# Patient Record
Sex: Female | Born: 1950
Health system: Southern US, Community
[De-identification: ages and names within clinical notes are randomized; demographics above are authoritative.]

## PROBLEM LIST (undated history)

## (undated) DIAGNOSIS — F419 Anxiety disorder, unspecified: Secondary | ICD-10-CM

## (undated) DIAGNOSIS — E785 Hyperlipidemia, unspecified: Secondary | ICD-10-CM

## (undated) DIAGNOSIS — K219 Gastro-esophageal reflux disease without esophagitis: Secondary | ICD-10-CM

## (undated) DIAGNOSIS — H269 Unspecified cataract: Secondary | ICD-10-CM

## (undated) DIAGNOSIS — L509 Urticaria, unspecified: Secondary | ICD-10-CM

## (undated) DIAGNOSIS — M1990 Unspecified osteoarthritis, unspecified site: Secondary | ICD-10-CM

## (undated) DIAGNOSIS — I10 Essential (primary) hypertension: Secondary | ICD-10-CM

## (undated) HISTORY — DX: Gastro-esophageal reflux disease without esophagitis: K21.9

## (undated) HISTORY — DX: Unspecified cataract: H26.9

## (undated) HISTORY — DX: Hyperlipidemia, unspecified: E78.5

## (undated) HISTORY — DX: Unspecified osteoarthritis, unspecified site: M19.90

## (undated) HISTORY — PX: CATARACT EXTRACTION, BILATERAL: SHX1313

## (undated) HISTORY — DX: Urticaria, unspecified: L50.9

## (undated) HISTORY — DX: Anxiety disorder, unspecified: F41.9

## (undated) HISTORY — PX: DILATION AND CURETTAGE OF UTERUS: SHX78

---

## 1999-03-10 ENCOUNTER — Encounter: Payer: Self-pay | Admitting: Internal Medicine

## 1999-03-10 ENCOUNTER — Encounter: Admission: RE | Admit: 1999-03-10 | Discharge: 1999-03-10 | Payer: Self-pay | Admitting: Internal Medicine

## 1999-03-27 ENCOUNTER — Ambulatory Visit (HOSPITAL_COMMUNITY): Admission: RE | Admit: 1999-03-27 | Discharge: 1999-03-27 | Payer: Self-pay | Admitting: Gastroenterology

## 1999-04-02 ENCOUNTER — Encounter (INDEPENDENT_AMBULATORY_CARE_PROVIDER_SITE_OTHER): Payer: Self-pay

## 1999-04-02 ENCOUNTER — Other Ambulatory Visit: Admission: RE | Admit: 1999-04-02 | Discharge: 1999-04-02 | Payer: Self-pay | Admitting: Obstetrics and Gynecology

## 1999-04-18 ENCOUNTER — Ambulatory Visit (HOSPITAL_COMMUNITY): Admission: RE | Admit: 1999-04-18 | Discharge: 1999-04-18 | Payer: Self-pay | Admitting: Obstetrics and Gynecology

## 2000-12-06 ENCOUNTER — Other Ambulatory Visit: Admission: RE | Admit: 2000-12-06 | Discharge: 2000-12-06 | Payer: Self-pay | Admitting: Internal Medicine

## 2001-10-26 ENCOUNTER — Other Ambulatory Visit: Admission: RE | Admit: 2001-10-26 | Discharge: 2001-10-26 | Payer: Self-pay | Admitting: Obstetrics & Gynecology

## 2001-11-09 ENCOUNTER — Ambulatory Visit (HOSPITAL_COMMUNITY): Admission: RE | Admit: 2001-11-09 | Discharge: 2001-11-09 | Payer: Self-pay | Admitting: Obstetrics & Gynecology

## 2001-11-09 ENCOUNTER — Encounter: Payer: Self-pay | Admitting: Obstetrics & Gynecology

## 2008-03-18 ENCOUNTER — Emergency Department (HOSPITAL_COMMUNITY): Admission: EM | Admit: 2008-03-18 | Discharge: 2008-03-18 | Payer: Self-pay | Admitting: Emergency Medicine

## 2010-09-12 NOTE — H&P (Signed)
[facility]  Patient:    Joanna Ray                    MRN: [id number] Adm. Date:  [id number] Attending:  Wandalee Ferdinand                         History and Physical  HISTORY OF PRESENT ILLNESS:   This is a 60 year old gravida 5, para 4, Ab1, referred through the courtesy of Dr. Norva Pavlov for evaluation of abnormal uterine bleeding.  Patient has subsequently undergone sonohysterogram and endometrial biopsy.  The sonohysterogram revealed a fibroid uterus with a 3.1-cm right adnexal cyst; there was also a 1.3-cm mass noted within the uterine cavity. The endometrial biopsy revealed benign proliferative endometrium only.  Patient is hence for diagnostic/operative hysteroscopy and dilatation and curettage.  MEDICATIONS:                  Nu-Iron and vitamins.  PAST MEDICAL HISTORY:         Medical:  Negative.                                Surgical:  D&C.  ALLERGIES:                    None.  FAMILY HISTORY:               Positive for uterine cancer in patients grandmother.  SOCIAL HISTORY:               Patient denies the use of tobacco or significant alcohol.  REVIEW OF SYSTEMS:            Noncontributory.  PHYSICAL EXAMINATION:  GENERAL:                      Well-developed, well-nourished pleasant black female in no acute distress.  Afebrile.  VITAL SIGNS:                  Stable.  SKIN:                         Warm and dry; without lesions.  LYMPH:                        There is no supraclavicular, cervical or inguinal  adenopathy.  HEENT:                        Normocephalic.  NECK:                         Supple without thyromegaly.  CHEST:                        Lungs are clear.  CARDIAC:                      Regular rate and rhythm; without murmurs, gallops or rubs.  BREASTS:                      Deferred.  ABDOMEN:                      Soft, nontender; without masses  or organomegaly.  Bowel  sounds are active.  MUSCULOSKELETAL:              Full range of motion without edema, cyanosis or CVA tenderness.  PELVIC:                       Examination deferred until examination under anesthesia.  IMPRESSION:                   1. Fibroid uterus.                               2. Intrauterine lesion noted at hysterosalpingogram                                  -- rule out polyp versus fibroid.                               3. Right adnexal cyst.  PLAN:                         1. Diagnostic/operative hysteroscopy.                               2. Dilatation and curettage.                                Risks, benefits, complications and alternatives were fully discussed with the patient; she states she understands and accepts. Questions invited and answered. DD:  04/18/99 TD:  04/18/99 Job: [id number]

## 2010-09-12 NOTE — Op Note (Signed)
[facility]  Patient:    Joanna Ray                    MRN: [id number] Proc. Date: 04/18/99 Adm. Date:  [id number] Attending:  Wandalee Ferdinand                           Operative Report  PREOPERATIVE DIAGNOSIS:       Intrauterine lesion noted at sonohysterogram - polyp versus submucosal fibroid. Abnormal uterine bleeding.  POSTOPERATIVE DIAGNOSIS:      Intrauterine lesion noted at sonohysterogram - polyp versus submucosal fibroid. Abnormal uterine bleeding.  Pathology pending.  OPERATION:                    Diagnostic/operative hysteroscopy with myomectomy. Dilatation and curettage.  SURGEON:                      Rudy Jew. Ashley Royalty, M.D.  ASSISTANT:  ANESTHESIA:                   General anesthesia.  ESTIMATED BLOOD LOSS:         Less than 25 cc.  COMPLICATIONS:                None.  PACKS AND DRAINS:             None.  DESCRIPTION OF PROCEDURE:     The patient was taken to the operating room and placed in the dorsal supine position.  After adequate general anesthesia was administered, she was placed in the lithotomy position and prepped and draped in the usual sterile fashion for vaginal surgery.  Posterior weighted retractor was placed per vagina.  Anterior lip of the cervix was grasped with a single tooth tenaculum.  The uterus was gently sounded to approximately 12 cm.  The cervix was then serially dilated with Shawnie Pons dilators to a size 29 [place].  Resectoscope was placed into the uterine cavity using Sorbitol distention medium.  The cavity was thoroughly surveyed.  On the anterior left aspect of the uterine cavity, an apparent fibroid tumor of approximately 1+ cm in greatest diameter was visualized. Using the resectoscope at 70 watts of cutting and 70 watts coagulation waveform, the lesion was excised and submitted to pathology for histologic studies. Hemostasis was obtained using coagulation waveform.  Next,  gentle uterine curettage was performed.  First a four quadrant technique as used, then a therapeutic technique was used.  The curettings were submitted separately to pathology for histologic studies.  The resectoscope was once again placed.  Hemostasis was noted.  The left and right tubal ostia were seen in their entirety.  There were no other lesions noted in the uterus or in the endocervix. At this point the vaginal instruments were removed.  Hemostasis was noted and the procedure terminated.  The patient was taken to the recovery room in excellent condition. DD:  04/18/99 TD:  04/19/99 Job: [id number]

## 2011-09-25 ENCOUNTER — Inpatient Hospital Stay (HOSPITAL_COMMUNITY)
Admission: EM | Admit: 2011-09-25 | Discharge: 2011-09-27 | DRG: 556 | Disposition: A | Discharge status: Home / Self Care | Payer: Self-pay | Attending: Internal Medicine | Admitting: Internal Medicine

## 2011-09-25 ENCOUNTER — Encounter (HOSPITAL_COMMUNITY): Payer: Self-pay | Admitting: Emergency Medicine

## 2011-09-25 ENCOUNTER — Emergency Department (HOSPITAL_COMMUNITY): Payer: Self-pay

## 2011-09-25 DIAGNOSIS — R29898 Other symptoms and signs involving the musculoskeletal system: Principal | ICD-10-CM | POA: Diagnosis present

## 2011-09-25 DIAGNOSIS — Z79899 Other long term (current) drug therapy: Secondary | ICD-10-CM

## 2011-09-25 DIAGNOSIS — Z823 Family history of stroke: Secondary | ICD-10-CM

## 2011-09-25 DIAGNOSIS — R531 Weakness: Secondary | ICD-10-CM | POA: Diagnosis present

## 2011-09-25 DIAGNOSIS — I10 Essential (primary) hypertension: Secondary | ICD-10-CM | POA: Diagnosis present

## 2011-09-25 DIAGNOSIS — Z7982 Long term (current) use of aspirin: Secondary | ICD-10-CM

## 2011-09-25 DIAGNOSIS — G459 Transient cerebral ischemic attack, unspecified: Secondary | ICD-10-CM

## 2011-09-25 DIAGNOSIS — Z6829 Body mass index (BMI) 29.0-29.9, adult: Secondary | ICD-10-CM

## 2011-09-25 DIAGNOSIS — R209 Unspecified disturbances of skin sensation: Secondary | ICD-10-CM | POA: Diagnosis present

## 2011-09-25 DIAGNOSIS — I639 Cerebral infarction, unspecified: Secondary | ICD-10-CM

## 2011-09-25 DIAGNOSIS — E669 Obesity, unspecified: Secondary | ICD-10-CM | POA: Diagnosis present

## 2011-09-25 DIAGNOSIS — E782 Mixed hyperlipidemia: Secondary | ICD-10-CM

## 2011-09-25 DIAGNOSIS — E785 Hyperlipidemia, unspecified: Secondary | ICD-10-CM | POA: Diagnosis present

## 2011-09-25 HISTORY — DX: Essential (primary) hypertension: I10

## 2011-09-25 LAB — DIFFERENTIAL
Basophils Absolute: 0.1 10*3/uL (ref 0.0–0.1)
Basophils Relative: 1 % (ref 0–1)
Eosinophils Absolute: 0.1 10*3/uL (ref 0.0–0.7)
Neutro Abs: 2.4 10*3/uL (ref 1.7–7.7)
Neutrophils Relative %: 52 % (ref 43–77)

## 2011-09-25 LAB — CBC
MCH: 30.2 pg (ref 26.0–34.0)
MCHC: 33.2 g/dL (ref 30.0–36.0)
Platelets: 206 10*3/uL (ref 150–400)
RDW: 13 % (ref 11.5–15.5)

## 2011-09-25 LAB — BASIC METABOLIC PANEL
Chloride: 100 mEq/L (ref 96–112)
GFR calc Af Amer: 69 mL/min — ABNORMAL LOW (ref >90)
GFR calc non Af Amer: 59 mL/min — ABNORMAL LOW (ref >90)
Potassium: 3.4 mEq/L — ABNORMAL LOW (ref 3.5–5.1)

## 2011-09-25 LAB — CARDIAC PANEL(CRET KIN+CKTOT+MB+TROPI)
Relative Index: 1.5 (ref 0.0–2.5)
Total CK: 112 U/L (ref 7–177)
Troponin I: <0.3 ng/mL (ref <0.30)

## 2011-09-25 MED ORDER — ENOXAPARIN SODIUM 40 MG/0.4ML ~~LOC~~ SOLN
40.0000 mg | SUBCUTANEOUS | Status: DC
  Administered 2011-09-25 – 2011-09-26 (×2): 40 mg via SUBCUTANEOUS
  Filled 2011-09-25 (×3): qty 0.4

## 2011-09-25 MED ORDER — ASPIRIN 300 MG RE SUPP
300.0000 mg | Freq: Every day | RECTAL | Status: DC
  Filled 2011-09-25 (×2): qty 1

## 2011-09-25 MED ORDER — ASPIRIN 325 MG PO TABS
325.0000 mg | ORAL_TABLET | Freq: Every day | ORAL | Status: DC
  Administered 2011-09-26 – 2011-09-27 (×2): 325 mg via ORAL
  Filled 2011-09-25 (×2): qty 1

## 2011-09-25 NOTE — ED Notes (Signed)
Pt alert, nad, arrives from home, c/o HTN, extremity weakness, onset several days ago, pt ambulates to triage, steady gait noted, speech clear, resp even unlabored, skin pwd

## 2011-09-25 NOTE — H&P (Signed)
[facility] History and Physical  Joanna Ray ZOX:[id number] DOB: [birth year] DOA: 09/25/2011   PCP: Hayden Rasmussen, NP, NP   Chief Complaint:  Chief Complaint  Patient presents with  . Hypertension  . Extremity Weakness     HPI:  61 year old woman with history of hypertension hyperlipidemia presented to the [facility] emergency room tonight with 3 days of left side body numbness. She says that her left upper extremity left lower extremity felt different for the past 3 days. She denies any weakness, speech difficulty, problems with vision. She has not been ill recently  Review of Systems:  Denies any shortness of breath chest pain nausea vomiting abdominal pain diarrhea headaches All the systems per history of present illness or negative  Past Medical History  Diagnosis Date  . Hypertension    History reviewed. No pertinent past surgical history. Social History:  reports that she has never smoked. She does not have any smokeless tobacco history on file. She reports that she does not drink alcohol or use illicit drugs.  No Known Allergies  Family History  Problem Relation Age of Onset  . Stroke Mother   . Stroke Father     Prior to Admission medications   Medication Sig Start Date End Date Taking? Authorizing Provider  cholecalciferol (VITAMIN D) 1000 UNITS tablet Take 1,000 Units by mouth daily.   Yes Historical Provider, MD  lisinopril-hydrochlorothiazide (PRINZIDE,ZESTORETIC) 20-12.5 MG per tablet Take 1 tablet by mouth daily.   Yes Historical Provider, MD  rosuvastatin (CRESTOR) 20 MG tablet Take 10 mg by mouth daily. Take 1/2 tablet   Yes Historical Provider, MD  vitamin B-12 (CYANOCOBALAMIN) 100 MCG tablet Take 50 mcg by mouth daily.   Yes Historical Provider, MD  vitamin E 100 UNIT capsule Take 100 Units by mouth daily.   Yes Historical Provider, MD   Physical Exam: Filed Vitals:   09/25/11 2031 09/25/11 2049 09/25/11 2144 09/25/11 2243  BP: 157/80  161/77 161/77 150/80  Pulse: 56 64 56 54  Temp:    98.3 F (36.8 C)  TempSrc:    Oral  Resp: 19 15 16 13   Weight:      SpO2: 100% 100% 100% 99%     General:  Alert and oriented x3  Eyes: Pupil equal round react to light accommodation extraocular intact no nystagmus  ENT: Normal external ears, no pharyngeal exudate  Neck: No JVD no thyromegaly  Cardiovascular:  normal S1-S2 no murmurs rubs gallops  Respiratory: Clear to auscultation bilaterally without wheezes rhonchi crackles  Abdomen: Soft nontender nondistended bowel sounds are present no palpable hepatosplenomegaly  Skin: Warm dry without rashes  Musculoskeletal: Normal muscle bulk and tone  Neurologic: Speech intact, finger to nose intact,  Strength 5 out 5 in all 4 extremities, deep tendon reflexes symmetric +2, no pronator drift, no dysarthria, visual fields intact, sensation intact on the right, questionable diminishment on left   Labs on Admission:  Basic Metabolic Panel:  Lab 09/25/11 8119  NA 140  K 3.4*  CL 100  CO2 28  GLUCOSE 95  BUN 12  CREATININE 1.01  CALCIUM 9.6  MG --  PHOS --   Liver Function Tests: No results found for this basename: AST:5,ALT:5,ALKPHOS:5,BILITOT:5,PROT:5,ALBUMIN:5 in the last 168 hours No results found for this basename: LIPASE:5,AMYLASE:5 in the last 168 hours No results found for this basename: AMMONIA:5 in the last 168 hours CBC:  Lab 09/25/11 2102  WBC 4.6  NEUTROABS 2.4  HGB 12.3  HCT 37.0  MCV  90.9  PLT 206   Cardiac Enzymes:  Lab 09/25/11 2102  CKTOTAL 112  CKMB 1.7  CKMBINDEX --  TROPONINI <0.30   BNP: No components found with this basename: POCBNP:5 CBG: No results found for this basename: GLUCAP:5 in the last 168 hours  Radiological Exams on Admission: Dg Chest 2 View  09/25/2011  *RADIOLOGY REPORT*  Clinical Data: Left-sided numbness, hypertension.  CHEST - 2 VIEW  Comparison: None  Findings: Heart and mediastinal contours are within normal  limits. No focal opacities or effusions.  No acute bony abnormality.  IMPRESSION: No active cardiopulmonary disease.  Original Report Authenticated By: Cyndie Chime, M.D.   Ct Head Wo Contrast  09/25/2011  *RADIOLOGY REPORT*  Clinical Data: Left-sided weakness, tingling.  CT HEAD WITHOUT CONTRAST  Technique:  Contiguous axial images were obtained from the base of the skull through the vertex without contrast.  Comparison: None.  Findings: No acute intracranial abnormality.  Specifically, no hemorrhage, hydrocephalus, mass lesion, acute infarction, or significant intracranial injury.  No acute calvarial abnormality. Mastoid air cells are clear.  IMPRESSION: No intracranial abnormality.  Original Report Authenticated By: Cyndie Chime, M.D.      Assessment/Plan Principal Problem:  *CVA (cerebral infarction) Active Problems:  Hypertension  Hyperlipidemia   1. Probable stroke versus conversion reaction due to stress. Patient with multiple risk factors for stroke including hypertension hyperlipidemia obesity and family history. Objective findings are not as impressive as history the patient gives. I would say though it is worthwhile placing the patient on neuro telemetry unit and checking MRI of the brain, carotid Dopplers, echocardiogram. If MRI shows strokes we will consult neurology. We will start aspirin and followup on blood pressure level measurements and lipid panel in the morning  Code Status: Full Family Communication: Husband Disposition Plan: Home  Venola Castello, MD  [facility] Pager [phone]  If 7PM-7AM, please contact night-coverage [email or web address] Password [facility] 09/25/2011, 10:47 PM

## 2011-09-25 NOTE — ED Provider Notes (Signed)
History     CSN: [id number]  Arrival date & time 09/25/11  Joanna Ray   First MD Initiated Contact with Patient 09/25/11 1959      Chief Complaint  Patient presents with  . Hypertension  . Extremity Weakness    (Consider location/radiation/quality/duration/timing/severity/associated sxs/prior treatment) Patient is a 61 y.o. female presenting with hypertension and extremity weakness. The history is provided by the patient.  Hypertension This is a chronic problem. Episode onset: Worse 3 days ago. The problem occurs constantly. The problem has not changed since onset.Pertinent negatives include no chest pain, no abdominal pain, no headaches and no shortness of breath. The symptoms are aggravated by nothing. The symptoms are relieved by nothing. She has tried nothing for the symptoms. The treatment provided no relief.  Extremity Weakness This is a new (Minimal extremity weakness but left-sided arm and leg numbness and states it feels slightly heavy) problem. Episode onset: 3 days ago. The problem occurs constantly. The problem has not changed since onset.Pertinent negatives include no chest pain, no abdominal pain, no headaches and no shortness of breath. The symptoms are aggravated by nothing. The symptoms are relieved by nothing. She has tried nothing for the symptoms. The treatment provided no relief.    Past Medical History  Diagnosis Date  . Hypertension     History reviewed. No pertinent past surgical history.  No family history on file.  History  Substance Use Topics  . Smoking status: Never Smoker   . Smokeless tobacco: Not on file  . Alcohol Use: No    OB History    Grav Para Term Preterm Abortions TAB SAB Ect Mult Living                  Review of Systems  Constitutional: Negative for fever.  Respiratory: Negative for shortness of breath.   Cardiovascular: Negative for chest pain.  Gastrointestinal: Negative for abdominal pain.  Musculoskeletal: Positive for  extremity weakness.  Neurological: Positive for numbness. Negative for dizziness, syncope, speech difficulty, light-headedness and headaches.  All other systems reviewed and are negative.    Allergies  Review of patient's allergies indicates no known allergies.  Home Medications   Current Outpatient Rx  Name Route Sig Dispense Refill  . VITAMIN D 1000 UNITS PO TABS Oral Take 1,000 Units by mouth daily.    Marland Kitchen LISINOPRIL-HYDROCHLOROTHIAZIDE 20-12.5 MG PO TABS Oral Take 1 tablet by mouth daily.    Marland Kitchen ROSUVASTATIN CALCIUM 20 MG PO TABS Oral Take 10 mg by mouth daily. Take 1/2 tablet    . VITAMIN B-12 100 MCG PO TABS Oral Take 50 mcg by mouth daily.    Marland Kitchen VITAMIN E 100 UNITS PO CAPS Oral Take 100 Units by mouth daily.      BP 161/77  Pulse 56  Temp 98 F (36.7 C)  Resp 16  Wt 188 lb (85.276 kg)  SpO2 100%  Physical Exam  Nursing note and vitals reviewed. Constitutional: She is oriented to person, place, and time. She appears well-developed and well-nourished. No distress.  HENT:  Head: Normocephalic and atraumatic.  Mouth/Throat: Oropharynx is clear and moist.  Eyes: Conjunctivae and EOM are normal. Pupils are equal, round, and reactive to light.  Neck: Normal range of motion. Neck supple.  Cardiovascular: Normal rate, regular rhythm and intact distal pulses.   No murmur heard. Pulmonary/Chest: Effort normal and breath sounds normal. No respiratory distress. She has no wheezes. She has no rales.  Abdominal: Soft. She exhibits no distension. There is  no tenderness. There is no rebound and no guarding.  Musculoskeletal: Normal range of motion. She exhibits no edema and no tenderness.  Neurological: She is alert and oriented to person, place, and time. She has normal strength. A sensory deficit is present. No cranial nerve deficit. Coordination and gait normal.       Decreased sensation over the left upper and lower extremity. No facial involvement.  Skin: Skin is warm and dry. No  rash noted. No erythema.  Psychiatric: She has a normal mood and affect. Her behavior is normal.    ED Course  Procedures (including critical care time)  Labs Reviewed  BASIC METABOLIC PANEL - Abnormal; Notable for the following:    Potassium 3.4 (*)    GFR calc non Af Amer 59 (*)    GFR calc Af Amer 69 (*)    All other components within normal limits  CBC  DIFFERENTIAL  CARDIAC PANEL(CRET KIN+CKTOT+MB+TROPI)   Dg Chest 2 View  09/25/2011  *RADIOLOGY REPORT*  Clinical Data: Left-sided numbness, hypertension.  CHEST - 2 VIEW  Comparison: None  Findings: Heart and mediastinal contours are within normal limits. No focal opacities or effusions.  No acute bony abnormality.  IMPRESSION: No active cardiopulmonary disease.  Original Report Authenticated By: Cyndie Chime, M.D.   Ct Head Wo Contrast  09/25/2011  *RADIOLOGY REPORT*  Clinical Data: Left-sided weakness, tingling.  CT HEAD WITHOUT CONTRAST  Technique:  Contiguous axial images were obtained from the base of the skull through the vertex without contrast.  Comparison: None.  Findings: No acute intracranial abnormality.  Specifically, no hemorrhage, hydrocephalus, mass lesion, acute infarction, or significant intracranial injury.  No acute calvarial abnormality. Mastoid air cells are clear.  IMPRESSION: No intracranial abnormality.  Original Report Authenticated By: Cyndie Chime, M.D.    Date: 09/25/2011  Rate: 54  Rhythm: normal sinus rhythm  QRS Axis: normal  Intervals: normal  ST/T Wave abnormalities: normal  Conduction Disutrbances: none  Narrative Interpretation: unremarkable      No diagnosis found.    MDM   Patient with a history of hypertension and hyperlipidemia who for the last 3 days has had numbness and slight heaviness in her left arm and leg. Concern for slight stroke this patient has also been hypertensive and has a family history of stroke. She has notable sensation difference on her left upper and lower  extremity compared to the right no facial involvement. She has no notable weakness in all of her labs and head CT are within normal limits. Will admit the patient for a stroke workup. Patient will go to [facility] to be evaluated and treated.        Gwyneth Sprout, MD 09/25/11 2236

## 2011-09-25 NOTE — ED Notes (Signed)
BP left 157/84 and BP right 156/81

## 2011-09-26 ENCOUNTER — Inpatient Hospital Stay (HOSPITAL_COMMUNITY): Payer: Self-pay

## 2011-09-26 DIAGNOSIS — R209 Unspecified disturbances of skin sensation: Secondary | ICD-10-CM

## 2011-09-26 DIAGNOSIS — E782 Mixed hyperlipidemia: Secondary | ICD-10-CM

## 2011-09-26 DIAGNOSIS — I10 Essential (primary) hypertension: Secondary | ICD-10-CM

## 2011-09-26 LAB — HEMOGLOBIN A1C
Hgb A1c MFr Bld: 5.6 %
Mean Plasma Glucose: 114 mg/dL

## 2011-09-26 LAB — LIPID PANEL
Cholesterol: 157 mg/dL (ref 0–200)
HDL: 53 mg/dL
LDL Cholesterol: 86 mg/dL (ref 0–99)
Total CHOL/HDL Ratio: 3 ratio
Triglycerides: 89 mg/dL
VLDL: 18 mg/dL (ref 0–40)

## 2011-09-26 MED ORDER — LISINOPRIL 20 MG PO TABS
20.0000 mg | ORAL_TABLET | Freq: Every day | ORAL | Status: DC
  Administered 2011-09-26 – 2011-09-27 (×2): 20 mg via ORAL
  Filled 2011-09-26 (×2): qty 1

## 2011-09-26 MED ORDER — VITAMIN E 45 MG (100 UNIT) PO CAPS
100.0000 [IU] | ORAL_CAPSULE | Freq: Every day | ORAL | Status: DC
  Administered 2011-09-26 – 2011-09-27 (×2): 100 [IU] via ORAL
  Filled 2011-09-26 (×2): qty 1

## 2011-09-26 MED ORDER — WHITE PETROLATUM GEL
Status: AC
  Filled 2011-09-26: qty 5

## 2011-09-26 MED ORDER — HYDROCHLOROTHIAZIDE 12.5 MG PO CAPS
12.5000 mg | ORAL_CAPSULE | Freq: Every day | ORAL | Status: DC
  Administered 2011-09-26 – 2011-09-27 (×2): 12.5 mg via ORAL
  Filled 2011-09-26 (×2): qty 1

## 2011-09-26 MED ORDER — ATORVASTATIN CALCIUM 10 MG PO TABS
10.0000 mg | ORAL_TABLET | Freq: Every day | ORAL | Status: DC
  Administered 2011-09-26: 10 mg via ORAL
  Filled 2011-09-26 (×2): qty 1

## 2011-09-26 MED ORDER — VITAMIN B-12 100 MCG PO TABS
50.0000 ug | ORAL_TABLET | Freq: Every day | ORAL | Status: DC
  Administered 2011-09-26 – 2011-09-27 (×2): 50 ug via ORAL
  Filled 2011-09-26 (×2): qty 1

## 2011-09-26 MED ORDER — VITAMIN D3 25 MCG (1000 UNIT) PO TABS
1000.0000 [IU] | ORAL_TABLET | Freq: Every day | ORAL | Status: DC
  Administered 2011-09-26 – 2011-09-27 (×2): 1000 [IU] via ORAL
  Filled 2011-09-26 (×2): qty 1

## 2011-09-26 MED ORDER — POTASSIUM CHLORIDE CRYS ER 20 MEQ PO TBCR
60.0000 meq | EXTENDED_RELEASE_TABLET | Freq: Once | ORAL | Status: AC
  Administered 2011-09-26: 60 meq via ORAL
  Filled 2011-09-26: qty 3

## 2011-09-26 MED ORDER — SENNOSIDES-DOCUSATE SODIUM 8.6-50 MG PO TABS: 1.0000 | ORAL_TABLET | Freq: Every evening | ORAL | Status: DC | PRN

## 2011-09-26 MED ORDER — SODIUM CHLORIDE 0.9 % IV SOLN: INTRAVENOUS | Status: DC

## 2011-09-26 MED ORDER — LISINOPRIL-HYDROCHLOROTHIAZIDE 20-12.5 MG PO TABS: 1.0000 | ORAL_TABLET | Freq: Every day | ORAL | Status: DC

## 2011-09-26 NOTE — Progress Notes (Signed)
DAILY PROGRESS NOTE                              GENERAL INTERNAL MEDICINE [facility]  SUBJECTIVE: Patient feels much better, denies any complaints. Her weakness resolved according to her  OBJECTIVE: BP 131/78  Pulse 62  Temp(Src) 98.3 F (36.8 C) (Oral)  Resp 18  Wt 86.773 kg (191 lb 4.8 oz)  SpO2 99%  Intake/Output Summary (Last 24 hours) at 09/26/11 1022 Last data filed at 09/26/11 0900  Gross per 24 hour  Intake    360 ml  Output      0 ml  Net    360 ml                      Weight change:  Physical Exam: General: Alert and awake oriented x3 not in any acute distress. HEENT: anicteric sclera, pupils equal reactive to light and accommodation CVS: S1-S2 heard, no murmur rubs or gallops Chest: clear to auscultation bilaterally, no wheezing rales or rhonchi Abdomen:  normal bowel sounds, soft, nontender, nondistended, no organomegaly Neuro: Cranial nerves II-XII intact, no focal neurological deficits Extremities: no cyanosis, no clubbing or edema noted bilaterally   Lab Results:  Basename 09/25/11 2102  NA 140  K 3.4*  CL 100  CO2 28  GLUCOSE 95  BUN 12  CREATININE 1.01  CALCIUM 9.6  MG --  PHOS --   No results found for this basename: AST:2,ALT:2,ALKPHOS:2,BILITOT:2,PROT:2,ALBUMIN:2 in the last 72 hours No results found for this basename: LIPASE:2,AMYLASE:2 in the last 72 hours  Basename 09/25/11 2102  WBC 4.6  NEUTROABS 2.4  HGB 12.3  HCT 37.0  MCV 90.9  PLT 206    Basename 09/25/11 2102  CKTOTAL 112  CKMB 1.7  CKMBINDEX --  TROPONINI <0.30   No components found with this basename: POCBNP:3 No results found for this basename: DDIMER:2 in the last 72 hours No results found for this basename: HGBA1C:2 in the last 72 hours  Basename 09/26/11 0612  CHOL 157  HDL 53  LDLCALC 86  TRIG 89  CHOLHDL 3.0  LDLDIRECT --   No results found for this basename: TSH,T4TOTAL,FREET3,T3FREE,THYROIDAB in the last 72 hours No results found for this  basename: VITAMINB12:2,FOLATE:2,FERRITIN:2,TIBC:2,IRON:2,RETICCTPCT:2 in the last 72 hours  Micro Results: No results found for this or any previous visit (from the past 240 hour(s)).  Studies/Results: Dg Chest 2 View  09/25/2011  *RADIOLOGY REPORT*  Clinical Data: Left-sided numbness, hypertension.  CHEST - 2 VIEW  Comparison: None  Findings: Heart and mediastinal contours are within normal limits. No focal opacities or effusions.  No acute bony abnormality.  IMPRESSION: No active cardiopulmonary disease.  Original Report Authenticated By: Cyndie Chime, M.D.   Ct Head Wo Contrast  09/25/2011  *RADIOLOGY REPORT*  Clinical Data: Left-sided weakness, tingling.  CT HEAD WITHOUT CONTRAST  Technique:  Contiguous axial images were obtained from the base of the skull through the vertex without contrast.  Comparison: None.  Findings: No acute intracranial abnormality.  Specifically, no hemorrhage, hydrocephalus, mass lesion, acute infarction, or significant intracranial injury.  No acute calvarial abnormality. Mastoid air cells are clear.  IMPRESSION: No intracranial abnormality.  Original Report Authenticated By: Cyndie Chime, M.D.   Medications: Scheduled Meds:   . aspirin  300 mg Rectal Daily   Or  . aspirin  325 mg Oral Daily  . atorvastatin  10 mg Oral q1800  .  cholecalciferol  1,000 Units Oral Daily  . enoxaparin  40 mg Subcutaneous Q24H  . lisinopril-hydrochlorothiazide  1 tablet Oral Daily  . vitamin B-12  50 mcg Oral Daily  . vitamin E  100 Units Oral Daily   Continuous Infusions:   . sodium chloride     PRN Meds:.senna-docusate  ASSESSMENT & PLAN: Principal Problem:  *CVA (cerebral infarction) Active Problems:  Hypertension  Hyperlipidemia   Left-sided weakness -Suspect stroke versus TIA, her neurological findings resolved completely. -MRI of the brain was done, the results are pending. Other workup pending. -Patient is on aspirin PT recommended no  followup.  Hypertension -Continue lisinopril/hydrochlorothiazide. -Blood pressure reasonably controlled.  Hyperlipidemia -Total cholesterol is 157, HDL is 53 and LDL is 86. -Patient is on atorvastatin we'll continue that.   LOS: 1 day   Zael Shuman A 09/26/2011, 10:22 AM

## 2011-09-26 NOTE — Evaluation (Addendum)
Physical Therapy Evaluation Patient Details Name: Joanna Ray MRN: [id number] DOB: 01/30/1951 Today's Date: 09/26/2011 Time: 9147-8295 PT Time Calculation (min): 37 min  PT Assessment / Plan / Recommendation Clinical Impression  Patient is a 61 yo female admitted to r/o CVA, with left sided weakness/numbness.  Patient with very mild weakness LUE/LLE that does not impact her functional mobility. NIHSS score = 0.  Patient independent with all mobility, and scored 24/24 on balance assessment.  No further PT is needed - PT will sign off.  PT provided patient with education on warning signs and risk factors for CVA.    PT Assessment  Patent does not need any further PT services    Follow Up Recommendations  No PT follow up    Barriers to Discharge        lEquipment Recommendations  None recommended by PT    Recommendations for Other Services     Frequency      Precautions / Restrictions Precautions Precautions: None Restrictions Weight Bearing Restrictions: No       Mobility  Bed Mobility Bed Mobility: Supine to Sit;Sit to Supine Supine to Sit: 7: Independent Sit to Supine: 7: Independent Transfers Transfers: Sit to Stand;Stand to Sit Sit to Stand: 7: Independent Stand to Sit: 7: Independent Ambulation/Gait Ambulation/Gait Assistance: 7: Independent Ambulation Distance (Feet): 250 Feet Assistive device: None Ambulation/Gait Assistance Details: Steady, no loss of balance Gait Pattern: Within Functional Limits Gait velocity: WFL General Gait Details: No gait deviations noted Stairs: Yes Stairs Assistance: 7: Independent Stair Management Technique: No rails Number of Stairs: 4  Modified Rankin (Stroke Patients Only) Pre-Morbid Rankin Score: No symptoms Modified Rankin: No significant disability     PT Goals  N/A  Visit Information  Last PT Received On: 09/26/11 Assistance Needed: +1    Subjective Data  Subjective: My arm and leg feel better Patient  Stated Goal: To return home and to work   Prior Functioning  Home Living Lives With: Spouse Available Help at Discharge: Family Type of Home: House Home Access: Stairs to enter Secretary/administrator of Steps: 2 Entrance Stairs-Rails: Left;Right Home Layout: One level Bathroom Shower/Tub: Engineer, manufacturing systems: Standard Bathroom Accessibility: Yes Home Adaptive Equipment: None Prior Function Level of Independence: Independent Able to Take Stairs?: Reciprically Driving: Yes Vocation: Full time 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[employer]: No difficulties Dominant Hand: Right    Cognition  Overall Cognitive Status: Appears within functional limits for tasks assessed/performed Arousal/Alertness: Awake/alert Orientation Level: Oriented X4 / Intact Behavior During Session: [facility] for tasks performed    Extremity/Trunk Assessment Right Upper Extremity Assessment RUE ROM/Strength/Tone: Within functional levels RUE Sensation: WFL - Light Touch RUE Coordination: WFL - gross motor Left Upper Extremity Assessment LUE ROM/Strength/Tone: Deficits LUE ROM/Strength/Tone Deficits: Very mild weakness (4+/5) LUE Sensation: WFL - Light Touch LUE Coordination: WFL - gross motor Right Lower Extremity Assessment RLE ROM/Strength/Tone: Within functional levels RLE Sensation: WFL - Light Touch RLE Coordination: WFL - gross motor Left Lower Extremity Assessment LLE ROM/Strength/Tone: Deficits LLE ROM/Strength/Tone Deficits: Slight weakness noted (4+/5) LLE Sensation: WFL - Light Touch LLE Coordination: WFL - gross motor Trunk Assessment Trunk Assessment: Normal   Balance Balance Balance Assessed: Yes Standardized Balance Assessment Standardized Balance Assessment: Dynamic Gait Index Dynamic Gait Index Level Surface: Normal Change in Gait Speed: Normal Gait with Horizontal Head Turns: Normal Gait with Vertical Head Turns: Normal Gait and Pivot Turn:  Normal Step Over Obstacle: Normal Step Around Obstacles: Normal Steps: Normal Total Score: 24  End of Session PT - End of Session Equipment Utilized During Treatment:  (None) Activity Tolerance: Patient tolerated treatment well Patient left: in bed;with family/visitor present;with call bell/phone within reach Nurse Communication: Mobility status   Vena Austria 09/26/2011, 8:50 AM Durenda Hurt. Renaldo Fiddler, [facility] Acute Rehab Services Pager [phone]

## 2011-09-26 NOTE — Progress Notes (Signed)
  Echocardiogram 2D Echocardiogram has been performed.  Karel Mowers, Real Cons 09/26/2011, 3:43 PM

## 2011-09-26 NOTE — Progress Notes (Signed)
[facility]ad hospitalist progress note Chief complaint. Transfer note. History of present illness. 61 year old female presented to [facility] emergency room after 3 days of left-sided body numbness. She was admitted under triad hospitalist and transferred to [facility] for further investigation and treatment. Multiple risk factors for stroke including hypertension, hyperlipidemia, and familial history. I am seeing the patient at bedside to ensure continued stability post transfer and to ensure her orders transferred appropriately. Currently the patient has no specific complaints. Vital signs. Temperature 98.3, pulse 56, respiration 16, blood pressure 156/72. O2 sats 99%. General appearance. Well-developed elderly female who is alert, cooperative, oriented. Speech is clear. Cardiac. Rate and rhythm regular. No jugular venous distention or edema. Negative Homans. Lungs. Breath sounds are clear and equal bilaterally. O2 sats are stable. Abdomen. Soft with positive bowel sounds. No pain. Neurologic. Cranial nerves II through XII grossly intact. No unilateral or focal defects found. Speech is clear. Patient is oriented. Impression/plan Problem #1. Stroke versus conversion reaction to to stress. Patient will be for MRI of the brain, carotid Doppler, and echocardiogram. Neurology consult if felt appropriate by rounding physician. Aspirin therapy initiated. Problem #2 hypertension. Will follow along blood pressure measurements. Problem #3. Hyperlipidemia. Lipid panel pending with a.m. labs. The patient and found to be stable post transfer and all orders appear to of transferred appropriately. Patient will be followed by Dr. Arthor Captain of the triad hospitalist team 2.

## 2011-09-26 NOTE — Progress Notes (Signed)
Occupational Therapy Note  OT order received and appreciated.  Pt admitted with left side weakness/numbness which has resolved per pt report.  Pt functioning at baseline level with BADLs and functional mobility.  No acute OT needs at this time.  Signing off. Thanks!   09/26/2011 Cipriano Mile OTR/L Pager [phone] Office [phone]

## 2011-09-26 NOTE — Progress Notes (Signed)
SPEECH LANGUAGE PATHOLOGY Order received for speech/language evaluation.  Spoke briefly with pt, who is not experiencing symptoms which would warrant eval.  Mentation/communication normal.  Will sign-off.  Raoul Ciano L. Samson Frederic, [place] CCC/SLP Pager [phone]

## 2011-09-26 NOTE — Progress Notes (Signed)
Bilateral carotid artery duplex completed.  Preliminary report is no evidence of significant ICA stenosis. 

## 2011-09-27 DIAGNOSIS — E782 Mixed hyperlipidemia: Secondary | ICD-10-CM

## 2011-09-27 DIAGNOSIS — R209 Unspecified disturbances of skin sensation: Secondary | ICD-10-CM

## 2011-09-27 DIAGNOSIS — I10 Essential (primary) hypertension: Secondary | ICD-10-CM

## 2011-09-27 LAB — BASIC METABOLIC PANEL
CO2: 26 mEq/L (ref 19–32)
Chloride: 108 mEq/L (ref 96–112)
Creatinine, Ser: 1 mg/dL (ref 0.50–1.10)
GFR calc Af Amer: 70 mL/min — ABNORMAL LOW (ref >90)
Potassium: 3.7 mEq/L (ref 3.5–5.1)
Sodium: 145 mEq/L (ref 135–145)

## 2011-09-27 MED ORDER — ASPIRIN 81 MG PO TBEC: 81.0000 mg | DELAYED_RELEASE_TABLET | Freq: Every day | ORAL | Status: AC

## 2011-09-27 NOTE — Discharge Summary (Signed)
HOSPITAL DISCHARGE SUMMARY  Joanna Ray  MRN: [id number]  DOB:13-Jun-1950  Date of Admission: 09/25/2011 Date of Discharge: 09/27/2011         LOS: 2 days   Attending Physician:  Clydia Llano A  Patient's PCP:  Hayden Rasmussen, NP, NP  Consults: None  Discharge Diagnoses: Present on Admission:  .Hypertension .Weakness .Hyperlipidemia   Medication List  As of 09/27/2011 11:41 AM   TAKE these medications         aspirin 81 MG EC tablet   Take 1 tablet (81 mg total) by mouth daily. Swallow whole.      cholecalciferol 1000 UNITS tablet   Commonly known as: VITAMIN D   Take 1,000 Units by mouth daily.      lisinopril-hydrochlorothiazide 20-12.5 MG per tablet   Commonly known as: PRINZIDE,ZESTORETIC   Take 1 tablet by mouth daily.      rosuvastatin 20 MG tablet   Commonly known as: CRESTOR   Take 10 mg by mouth daily. Take 1/2 tablet      vitamin B-12 100 MCG tablet   Commonly known as: CYANOCOBALAMIN   Take 50 mcg by mouth daily.      vitamin E 100 UNIT capsule   Take 100 Units by mouth daily.             Brief Admission History: 61 year old woman with history of hypertension and hyperlipidemia presented to the [facility] emergency room tonight with 3 days of left side body numbness. She says that her left upper extremity left lower extremity felt different for the past 3 days. She denies any weakness, speech difficulty, problems with vision. She has not been ill recently  Hospital Course: Present on Admission:  .Hypertension .Weakness .Hyperlipidemia  1. Left-sided numbness: Patient was initially presented to [facility] long hospital for evaluation and transferred to [facility] cone for neurology telemetry unit. MRI of the head was done and showed no acute findings. And after patient noted to the hospital she mentioned that the left side numbness/weakness resolved completely. Patient had 2-D echocardiogram, and carotid duplex done, preliminary report showed normal  results. I advised her to take OTC low-dose aspirin. She was evaluated by PT/OT recommended no followup. Patient is safe to be discharged home followup with primary care physician.  2. Hypertension: Patient is on Zestoretic 20/12.5, this continued throughout the hospital stay. Her blood pressure control is okay.  3. Dyslipidemia: Patient is on Crestor, that continued. Her total cholesterol is 157, triglyceride 89, HDL 53 and LDL is 86.   Day of Discharge BP 122/75  Pulse 54  Temp(Src) 98 F (36.7 C) (Oral)  Resp 18  Ht 5\' 8"  (1.727 m)  Wt 86.773 kg (191 lb 4.8 oz)  BMI 29.09 kg/m2  SpO2 99% Physical Exam: GEN: No acute distress, cooperative with exam PSYCH: alert and oriented x4; does not appear anxious does not appear depressed; affect is normal  HEENT: Mucous membranes pink and anicteric;  Mouth: without oral thrush or lesions Eyes: PERRLA; EOM intact;  Neck: no cervical lymphadenopathy nor thyromegaly or carotid bruit; no JVD;  CHEST WALL: No tenderness, symmetrical to breathing bilaterally CHEST: Normal respiration, clear to auscultation bilaterally  HEART: Regular rate and rhythm; no murmurs, rubs or gallops, S1 and S2 heard  BACK: No kyphosis or scoliosis; no CVA tenderness  ABDOMEN:  soft non-tender; no masses, no organomegaly, normal abdominal bowel sounds; no pannus; no intertriginous candida.  EXTREMITIES: No bone or joint deformity; no edema; no ulcerations.  PULSES: 2+ and  symmetric, neurovascularity is intact SKIN: Normal hydration no rash or ulceration, no flushing or suspicious lesions  CNS: Cranial nerves 2-12 grossly intact no focal neurologic deficit, coordination is intact gait not tested    Results for orders placed during the hospital encounter of 09/25/11 (from the past 24 hour(s))  BASIC METABOLIC PANEL     Status: Abnormal   Collection Time   09/27/11  5:31 AM      Component Value Range   Sodium 145  135 - 145 (mEq/L)   Potassium 3.7  3.5 - 5.1 (mEq/L)     Chloride 108  96 - 112 (mEq/L)   CO2 26  19 - 32 (mEq/L)   Glucose, Bld 89  70 - 99 (mg/dL)   BUN 15  6 - 23 (mg/dL)   Creatinine, Ser 9.14  0.50 - 1.10 (mg/dL)   Calcium 9.4  8.4 - 78.2 (mg/dL)   GFR calc non Af Amer 60 (*) >90 (mL/min)   GFR calc Af Amer 70 (*) >90 (mL/min)    Disposition: Home   Follow-up Appts: Discharge Orders    Future Orders Please Complete By Expires   Increase activity slowly         Follow-up Information    Follow up with MABE,DAVID, NP in 1 week.   Contact information:   [address] [facility] [place] [address] [phone]          I spent 40 minutes completing paperwork and coordinating discharge efforts.  SignedClydia Llano A 09/27/2011, 11:41 AM

## 2011-09-28 NOTE — Progress Notes (Signed)
Clint Lipps Pager: [phone] 09/28/2011, 1:23 PM

## 2013-09-01 ENCOUNTER — Other Ambulatory Visit: Payer: Self-pay | Admitting: Internal Medicine

## 2013-09-04 ENCOUNTER — Encounter: Payer: Self-pay | Admitting: Internal Medicine

## 2013-10-06 ENCOUNTER — Other Ambulatory Visit: Payer: Self-pay | Admitting: Internal Medicine

## 2013-10-12 ENCOUNTER — Encounter: Payer: Self-pay | Admitting: Internal Medicine

## 2013-11-30 ENCOUNTER — Other Ambulatory Visit: Payer: Self-pay | Admitting: Internal Medicine

## 2013-11-30 ENCOUNTER — Other Ambulatory Visit: Payer: No Typology Code available for payment source | Admitting: Internal Medicine

## 2013-11-30 DIAGNOSIS — Z0000 Encounter for general adult medical examination without abnormal findings: Secondary | ICD-10-CM

## 2013-11-30 DIAGNOSIS — I10 Essential (primary) hypertension: Secondary | ICD-10-CM

## 2013-11-30 DIAGNOSIS — E785 Hyperlipidemia, unspecified: Secondary | ICD-10-CM

## 2013-11-30 LAB — CBC WITH DIFFERENTIAL/PLATELET
BASOS PCT: 2 % — AB (ref 0–1)
Basophils Absolute: 0.1 10*3/uL (ref 0.0–0.1)
EOS ABS: 0.1 10*3/uL (ref 0.0–0.7)
EOS PCT: 2 % (ref 0–5)
HCT: 37.8 % (ref 36.0–46.0)
Hemoglobin: 12.7 g/dL (ref 12.0–15.0)
Lymphocytes Relative: 41 % (ref 12–46)
Lymphs Abs: 2.1 10*3/uL (ref 0.7–4.0)
MCH: 30.3 pg (ref 26.0–34.0)
MCHC: 33.6 g/dL (ref 30.0–36.0)
MCV: 90.2 fL (ref 78.0–100.0)
MONOS PCT: 12 % (ref 3–12)
Monocytes Absolute: 0.6 10*3/uL (ref 0.1–1.0)
NEUTROS PCT: 43 % (ref 43–77)
Neutro Abs: 2.2 10*3/uL (ref 1.7–7.7)
PLATELETS: 227 10*3/uL (ref 150–400)
RBC: 4.19 MIL/uL (ref 3.87–5.11)
RDW: 14.6 % (ref 11.5–15.5)
WBC: 5 10*3/uL (ref 4.0–10.5)

## 2013-12-01 ENCOUNTER — Encounter: Payer: Self-pay | Admitting: Internal Medicine

## 2013-12-01 ENCOUNTER — Other Ambulatory Visit (HOSPITAL_COMMUNITY)
Admission: RE | Admit: 2013-12-01 | Discharge: 2013-12-01 | Disposition: A | Discharge status: Home / Self Care | Payer: No Typology Code available for payment source | Source: Ambulatory Visit | Attending: Internal Medicine | Admitting: Internal Medicine

## 2013-12-01 ENCOUNTER — Ambulatory Visit (INDEPENDENT_AMBULATORY_CARE_PROVIDER_SITE_OTHER): Payer: No Typology Code available for payment source | Admitting: Internal Medicine

## 2013-12-01 VITALS — BP 130/84 | HR 68 | Temp 98.6°F | Ht 64.25 in | Wt 207.0 lb

## 2013-12-01 DIAGNOSIS — Z01419 Encounter for gynecological examination (general) (routine) without abnormal findings: Secondary | ICD-10-CM | POA: Insufficient documentation

## 2013-12-01 DIAGNOSIS — Z0000 Encounter for general adult medical examination without abnormal findings: Secondary | ICD-10-CM

## 2013-12-01 DIAGNOSIS — L538 Other specified erythematous conditions: Secondary | ICD-10-CM

## 2013-12-01 DIAGNOSIS — A499 Bacterial infection, unspecified: Secondary | ICD-10-CM

## 2013-12-01 DIAGNOSIS — R319 Hematuria, unspecified: Secondary | ICD-10-CM

## 2013-12-01 DIAGNOSIS — E785 Hyperlipidemia, unspecified: Secondary | ICD-10-CM

## 2013-12-01 DIAGNOSIS — L304 Erythema intertrigo: Secondary | ICD-10-CM

## 2013-12-01 DIAGNOSIS — I10 Essential (primary) hypertension: Secondary | ICD-10-CM

## 2013-12-01 DIAGNOSIS — B9689 Other specified bacterial agents as the cause of diseases classified elsewhere: Secondary | ICD-10-CM

## 2013-12-01 DIAGNOSIS — N760 Acute vaginitis: Secondary | ICD-10-CM

## 2013-12-01 LAB — POCT URINALYSIS DIPSTICK
BILIRUBIN UA: NEGATIVE
Glucose, UA: NEGATIVE
KETONES UA: NEGATIVE
Leukocytes, UA: NEGATIVE
Nitrite, UA: NEGATIVE
PH UA: 5
PROTEIN UA: NEGATIVE
SPEC GRAV UA: 1.025
Urobilinogen, UA: NEGATIVE

## 2013-12-01 LAB — HEMOGLOBIN A1C
HEMOGLOBIN A1C: 5.8 % — AB (ref <5.7)
Mean Plasma Glucose: 120 mg/dL — ABNORMAL HIGH (ref <117)

## 2013-12-01 LAB — COMPREHENSIVE METABOLIC PANEL
ALK PHOS: 48 U/L (ref 39–117)
ALT: 14 U/L (ref 0–35)
AST: 16 U/L (ref 0–37)
Albumin: 4.4 g/dL (ref 3.5–5.2)
BILIRUBIN TOTAL: 0.8 mg/dL (ref 0.2–1.2)
BUN: 18 mg/dL (ref 6–23)
CO2: 29 mEq/L (ref 19–32)
CREATININE: 1.03 mg/dL (ref 0.50–1.10)
Calcium: 9.8 mg/dL (ref 8.4–10.5)
Chloride: 104 mEq/L (ref 96–112)
GLUCOSE: 91 mg/dL (ref 70–99)
Potassium: 4 mEq/L (ref 3.5–5.3)
SODIUM: 141 meq/L (ref 135–145)
TOTAL PROTEIN: 7.2 g/dL (ref 6.0–8.3)

## 2013-12-01 LAB — POCT WET PREP (WET MOUNT)

## 2013-12-01 LAB — LIPID PANEL
CHOL/HDL RATIO: 3.8 ratio
Cholesterol: 156 mg/dL (ref 0–200)
HDL: 41 mg/dL (ref >39)
LDL CALC: 87 mg/dL (ref 0–99)
TRIGLYCERIDES: 139 mg/dL (ref <150)
VLDL: 28 mg/dL (ref 0–40)

## 2013-12-01 LAB — VITAMIN D 25 HYDROXY (VIT D DEFICIENCY, FRACTURES): Vit D, 25-Hydroxy: 53 ng/mL (ref 30–89)

## 2013-12-01 LAB — TSH: TSH: 1.352 u[IU]/mL (ref 0.350–4.500)

## 2013-12-01 MED ORDER — LISINOPRIL-HYDROCHLOROTHIAZIDE 20-12.5 MG PO TABS: 1.0000 | ORAL_TABLET | Freq: Every day | ORAL | Status: DC

## 2013-12-01 MED ORDER — KETOCONAZOLE 2 % EX CREA: 1.0000 "application " | TOPICAL_CREAM | Freq: Every day | CUTANEOUS | Status: DC

## 2013-12-01 MED ORDER — METRONIDAZOLE 500 MG PO TABS: 500.0000 mg | ORAL_TABLET | Freq: Two times a day (BID) | ORAL | Status: DC

## 2013-12-01 NOTE — Patient Instructions (Signed)
Flagyl 500 mg twice daily for 7 days followed by a vinegar and water douche. Nizoral cream to intertrigo areas once daily. Return in 6 months. Continue same medications previously prescribed.

## 2013-12-02 LAB — URINE CULTURE: Colony Count: 45000

## 2013-12-02 LAB — URINALYSIS, MICROSCOPIC ONLY
CRYSTALS: NONE SEEN
Casts: NONE SEEN

## 2013-12-02 LAB — URINALYSIS, ROUTINE W REFLEX MICROSCOPIC
BILIRUBIN URINE: NEGATIVE
GLUCOSE, UA: NEGATIVE mg/dL
HGB URINE DIPSTICK: NEGATIVE
KETONES UR: NEGATIVE mg/dL
Nitrite: NEGATIVE
PH: 5 (ref 5.0–8.0)
Protein, ur: NEGATIVE mg/dL
SPECIFIC GRAVITY, URINE: 1.026 (ref 1.005–1.030)
Urobilinogen, UA: 0.2 mg/dL (ref 0.0–1.0)

## 2013-12-04 NOTE — Progress Notes (Signed)
Patient informed. 

## 2013-12-05 LAB — CYTOLOGY - PAP

## 2013-12-24 NOTE — Progress Notes (Signed)
   Subjective:    Patient ID: Joanna Ray, female    DOB: 10-12-1950, 63 y.o.   MRN: [id number]  HPI 63 year old 63 female presents to the office for the first time today. And history of hypertension and hyperlipidemia.  No history of operations or accidents.  No known drug allergies.  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 Patient has not smoked for the past 20 years. No alcohol consumption. Resides with husband. Sister is Leslee Home 3 adult sons and one adult daughter.x.  Family history: Father died at age 89 of a stroke. Mother age 55 with history of stroke in 2010 and history of hypertension. One brother with history of hypertension and MI. One sister age 28 with diabetes. One sister age 47 with hypertension and another sister age 54 with hypertension. One son age 77 with hypertension.      Review of Systems symptoms consisting of pain in back and leg. Thinks she has sciatica.     Objective:   Physical Exam  Vitals reviewed. Constitutional: She is oriented to person, place, and time. She appears well-developed and well-nourished. No distress.  HENT:  Head: Normocephalic and atraumatic.  Right Ear: External ear normal.  Left Ear: External ear normal.  Mouth/Throat: Oropharynx is clear and moist. No oropharyngeal exudate.  Eyes: Conjunctivae and EOM are normal. Pupils are equal, round, and reactive to light. Right eye exhibits no discharge. Left eye exhibits no discharge. No scleral icterus.  Neck: Neck supple. No JVD present. No thyromegaly present.  Cardiovascular: Normal rate, regular rhythm, normal heart sounds and intact distal pulses.   No murmur heard. Pulmonary/Chest: Effort normal and breath sounds normal. No respiratory distress. She has no wheezes. She has no rales.  Breasts normal female  Abdominal: Bowel sounds are normal. She exhibits no distension. There is no rebound and no guarding.  Genitourinary:  Slight vaginal  discharge. Wet prep contains clue cells. Pap smear taken. Bimanual normal.  Musculoskeletal: Normal range of motion. She exhibits no edema.  Neurological: She is alert and oriented to person, place, and time. She has normal reflexes. She displays normal reflexes. No cranial nerve deficit. Coordination normal.  Skin: Skin is warm and dry. She is not diaphoretic.  Intertrigo  Psychiatric: She has a normal mood and affect. Her behavior is normal. Judgment and thought content normal.          Assessment & Plan:  History of hyperlipidemia  Plan: Hypertension-stable  Hyperlipidemia-normal on statin therapy  Intertrigo-treat with Nizoral cream prior  Hypertension-stable  Hyperlipidemia-lipids normal on statin medication  Intertrigo-treat with Nizoral cream  Bacterial vaginosis treat with Flagyl 500 mg twice daily for 7 days followed by vinegar and water douche  Plan:  Obtain records from Dr. Karlton Lemon and return in 6 months

## 2014-06-01 ENCOUNTER — Other Ambulatory Visit: Payer: No Typology Code available for payment source | Admitting: Internal Medicine

## 2014-06-08 ENCOUNTER — Ambulatory Visit: Payer: No Typology Code available for payment source | Admitting: Internal Medicine

## 2014-06-08 ENCOUNTER — Other Ambulatory Visit: Payer: No Typology Code available for payment source | Admitting: Internal Medicine

## 2014-06-15 ENCOUNTER — Ambulatory Visit: Payer: No Typology Code available for payment source | Admitting: Internal Medicine

## 2014-06-29 ENCOUNTER — Other Ambulatory Visit: Payer: No Typology Code available for payment source | Admitting: Internal Medicine

## 2014-07-06 ENCOUNTER — Ambulatory Visit: Payer: No Typology Code available for payment source | Admitting: Internal Medicine

## 2014-07-30 ENCOUNTER — Emergency Department (HOSPITAL_COMMUNITY)
Admission: EM | Admit: 2014-07-30 | Discharge: 2014-07-30 | Disposition: A | Discharge status: Home / Self Care | Payer: No Typology Code available for payment source | Attending: Emergency Medicine | Admitting: Emergency Medicine

## 2014-07-30 ENCOUNTER — Encounter (HOSPITAL_COMMUNITY): Payer: Self-pay

## 2014-07-30 DIAGNOSIS — Z79899 Other long term (current) drug therapy: Secondary | ICD-10-CM | POA: Insufficient documentation

## 2014-07-30 DIAGNOSIS — E785 Hyperlipidemia, unspecified: Secondary | ICD-10-CM | POA: Diagnosis not present

## 2014-07-30 DIAGNOSIS — H8110 Benign paroxysmal vertigo, unspecified ear: Secondary | ICD-10-CM | POA: Insufficient documentation

## 2014-07-30 DIAGNOSIS — I10 Essential (primary) hypertension: Secondary | ICD-10-CM | POA: Insufficient documentation

## 2014-07-30 DIAGNOSIS — Y9389 Activity, other specified: Secondary | ICD-10-CM | POA: Diagnosis not present

## 2014-07-30 DIAGNOSIS — Y998 Other external cause status: Secondary | ICD-10-CM | POA: Insufficient documentation

## 2014-07-30 DIAGNOSIS — Y92410 Unspecified street and highway as the place of occurrence of the external cause: Secondary | ICD-10-CM | POA: Insufficient documentation

## 2014-07-30 DIAGNOSIS — Z041 Encounter for examination and observation following transport accident: Secondary | ICD-10-CM | POA: Diagnosis not present

## 2014-07-30 DIAGNOSIS — R42 Dizziness and giddiness: Secondary | ICD-10-CM | POA: Diagnosis present
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
## 2014-07-30 NOTE — Discharge Instructions (Signed)
Benign Positional Vertigo °Vertigo means you feel like you or your surroundings are moving when they are not. Benign positional vertigo is the most common form of vertigo. Benign means that the cause of your condition is not serious. Benign positional vertigo is more common in older adults. °CAUSES  °Benign positional vertigo is the result of an upset in the labyrinth system. This is an area in the middle ear that helps control your balance. This may be caused by a viral infection, head injury, or repetitive motion. However, often no specific cause is found. °SYMPTOMS  °Symptoms of benign positional vertigo occur when you move your head or eyes in different directions. Some of the symptoms may include: °1. Loss of balance and falls. °2. Vomiting. °3. Blurred vision. °4. Dizziness. °5. Nausea. °6. Involuntary eye movements (nystagmus). °DIAGNOSIS  °Benign positional vertigo is usually diagnosed by physical exam. If the specific cause of your benign positional vertigo is unknown, your caregiver may perform imaging tests, such as magnetic resonance imaging (MRI) or computed tomography (CT). °TREATMENT  °Your caregiver may recommend movements or procedures to correct the benign positional vertigo. Medicines such as meclizine, benzodiazepines, and medicines for nausea may be used to treat your symptoms. In rare cases, if your symptoms are caused by certain conditions that affect the inner ear, you may need surgery. °HOME CARE INSTRUCTIONS  °· Follow your caregiver's instructions. °· Move slowly. Do not make sudden body or head movements. °· Avoid driving. °· Avoid operating heavy machinery. °· Avoid performing any tasks that would be dangerous to you or others during a vertigo episode. °· Drink enough fluids to keep your urine clear or pale yellow. °SEEK IMMEDIATE MEDICAL CARE IF:  °· You develop problems with walking, weakness, numbness, or using your arms, hands, or legs. °· You have difficulty speaking. °· You develop  severe headaches. °· Your nausea or vomiting continues or gets worse. °· You develop visual changes. °· Your family or friends notice any behavioral changes. °· Your condition gets worse. °· You have a fever. °· You develop a stiff neck or sensitivity to light. °MAKE SURE YOU:  °· Understand these instructions. °· Will watch your condition. °· Will get help right away if you are not doing well or get worse. °Document Released: 01/19/2006 Document Revised: 07/06/2011 Document Reviewed: 01/01/2011 °ExitCare® Patient Information ©2015 [employer]. This information is not intended to replace advice given to you by your health care provider. Make sure you discuss any questions you have with your health care provider. ° °Epley Maneuver Self-Care °WHAT IS THE EPLEY MANEUVER? °The Epley maneuver is an exercise you can do to relieve symptoms of benign paroxysmal positional vertigo (BPPV). This condition is often just referred to as vertigo. BPPV is caused by the movement of tiny crystals (canaliths) inside your inner ear. The accumulation and movement of canaliths in your inner ear causes a sudden spinning sensation (vertigo) when you move your head to certain positions. Vertigo usually lasts about 30 seconds. BPPV usually occurs in just one ear. If you get vertigo when you lie on your left side, you probably have BPPV in your left ear. Your health care provider can tell you which ear is involved.  °BPPV may be caused by a head injury. Many people older than 50 get BPPV for unknown reasons. If you have been diagnosed with BPPV, your health care provider may teach you how to do this maneuver. BPPV is not life threatening (benign) and usually goes away in time.  °  WHEN SHOULD I PERFORM THE EPLEY MANEUVER? °You can do this maneuver at home whenever you have symptoms of vertigo. You may do the Epley maneuver up to 3 times a day until your symptoms of vertigo go away. °HOW SHOULD I DO THE EPLEY MANEUVER? °7. Sit on the edge of a  bed or table with your back straight. Your legs should be extended or hanging over the edge of the bed or table.   °8. Turn your head halfway toward the affected ear.   °9. Lie backward quickly with your head turned until you are lying flat on your back. You may want to position a pillow under your shoulders.   °10. Hold this position for 30 seconds. You may experience an attack of vertigo. This is normal. Hold this position until the vertigo stops. °11. Then turn your head to the opposite direction until your unaffected ear is facing the floor.   °12. Hold this position for 30 seconds. You may experience an attack of vertigo. This is normal. Hold this position until the vertigo stops. °13. Now turn your whole body to the same side as your head. Hold for another 30 seconds.   °14. You can then sit back up. °ARE THERE RISKS TO THIS MANEUVER? °In some cases, you may have other symptoms (such as changes in your vision, weakness, or numbness). If you have these symptoms, stop doing the maneuver and call your health care provider. Even if doing these maneuvers relieves your vertigo, you may still have dizziness. Dizziness is the sensation of light-headedness but without the sensation of movement. Even though the Epley maneuver may relieve your vertigo, it is possible that your symptoms will return within 5 years. °WHAT SHOULD I DO AFTER THIS MANEUVER? °After doing the Epley maneuver, you can return to your normal activities. Ask your doctor if there is anything you should do at home to prevent vertigo. This may include: °· Sleeping with two or more pillows to keep your head elevated. °· Not sleeping on the side of your affected ear. °· Getting up slowly from bed. °· Avoiding sudden movements during the day. °· Avoiding extreme head movement, like looking up or bending over. °· Wearing a cervical collar to prevent sudden head movements. °WHAT SHOULD I DO IF MY SYMPTOMS GET WORSE? °Call your health care provider if your  vertigo gets worse. Call your provider right way if you have other symptoms, including:  °· Nausea. °· Vomiting. °· Headache. °· Weakness. °· Numbness. °· Vision changes. °Document Released: 04/18/2013 Document Reviewed: 04/18/2013 °ExitCare® Patient Information ©2015 [employer]. This information is not intended to replace advice given to you by your health care provider. Make sure you discuss any questions you have with your health care provider. ° °

## 2014-10-25 ENCOUNTER — Other Ambulatory Visit: Payer: No Typology Code available for payment source | Admitting: Internal Medicine

## 2014-10-25 DIAGNOSIS — Z79899 Other long term (current) drug therapy: Secondary | ICD-10-CM

## 2014-10-25 DIAGNOSIS — E785 Hyperlipidemia, unspecified: Secondary | ICD-10-CM

## 2014-10-25 LAB — HEPATIC FUNCTION PANEL
ALBUMIN: 4.2 g/dL (ref 3.5–5.2)
ALK PHOS: 44 U/L (ref 39–117)
ALT: 46 U/L — ABNORMAL HIGH (ref 0–35)
AST: 39 U/L — ABNORMAL HIGH (ref 0–37)
BILIRUBIN DIRECT: 0.1 mg/dL (ref 0.0–0.3)
Indirect Bilirubin: 0.5 mg/dL (ref 0.2–1.2)
Total Bilirubin: 0.6 mg/dL (ref 0.2–1.2)
Total Protein: 7.4 g/dL (ref 6.0–8.3)

## 2014-10-25 LAB — LIPID PANEL
Cholesterol: 195 mg/dL (ref 0–200)
HDL: 53 mg/dL
LDL Cholesterol: 113 mg/dL — ABNORMAL HIGH (ref 0–99)
Total CHOL/HDL Ratio: 3.7 ratio
Triglycerides: 145 mg/dL
VLDL: 29 mg/dL (ref 0–40)

## 2014-11-01 ENCOUNTER — Encounter: Payer: Self-pay | Admitting: Internal Medicine

## 2014-11-01 ENCOUNTER — Ambulatory Visit (INDEPENDENT_AMBULATORY_CARE_PROVIDER_SITE_OTHER): Payer: No Typology Code available for payment source | Admitting: Internal Medicine

## 2014-11-01 VITALS — BP 130/84 | HR 58 | Temp 97.8°F | Wt 205.0 lb

## 2014-11-01 DIAGNOSIS — E785 Hyperlipidemia, unspecified: Secondary | ICD-10-CM | POA: Diagnosis not present

## 2014-11-01 DIAGNOSIS — R748 Abnormal levels of other serum enzymes: Secondary | ICD-10-CM

## 2014-11-01 DIAGNOSIS — I10 Essential (primary) hypertension: Secondary | ICD-10-CM

## 2014-11-02 ENCOUNTER — Ambulatory Visit: Payer: No Typology Code available for payment source | Admitting: Internal Medicine

## 2014-11-25 ENCOUNTER — Encounter: Payer: Self-pay | Admitting: Internal Medicine

## 2014-11-25 NOTE — Patient Instructions (Addendum)
Continue same medications and return in 3 months for follow-up of elevated liver functions. Watch diet. Try to lose a bit of weight.
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
## 2014-11-26 ENCOUNTER — Encounter: Payer: Self-pay | Admitting: Internal Medicine

## 2014-11-26 ENCOUNTER — Ambulatory Visit (INDEPENDENT_AMBULATORY_CARE_PROVIDER_SITE_OTHER): Payer: No Typology Code available for payment source | Admitting: Internal Medicine

## 2014-11-26 VITALS — BP 136/72 | HR 78 | Temp 100.5°F | Wt 198.5 lb

## 2014-11-26 DIAGNOSIS — J069 Acute upper respiratory infection, unspecified: Secondary | ICD-10-CM

## 2014-11-26 MED ORDER — CLARITHROMYCIN 500 MG PO TABS: 500.0000 mg | ORAL_TABLET | Freq: Two times a day (BID) | ORAL | Status: DC

## 2014-11-26 MED ORDER — HYDROCODONE-HOMATROPINE 5-1.5 MG/5ML PO SYRP: 5.0000 mL | ORAL_SOLUTION | Freq: Three times a day (TID) | ORAL | Status: DC | PRN

## 2014-12-12 ENCOUNTER — Encounter: Payer: Self-pay | Admitting: Dietician

## 2014-12-12 ENCOUNTER — Encounter: Payer: No Typology Code available for payment source | Attending: Internal Medicine | Admitting: Dietician

## 2014-12-12 VITALS — Ht 65.0 in | Wt 201.2 lb

## 2014-12-12 DIAGNOSIS — Z6833 Body mass index (BMI) 33.0-33.9, adult: Secondary | ICD-10-CM | POA: Insufficient documentation

## 2014-12-12 DIAGNOSIS — E669 Obesity, unspecified: Secondary | ICD-10-CM | POA: Diagnosis present

## 2014-12-12 DIAGNOSIS — Z713 Dietary counseling and surveillance: Secondary | ICD-10-CM | POA: Diagnosis not present

## 2014-12-12 NOTE — Progress Notes (Signed)
   Subjective:    Patient ID: Joanna Ray, female    DOB: 10-12-1950, 64 y.o.   MRN: [id number]  HPI  64 year old Female in today with acute URI symptoms. Has cough and congestion. No shaking chills. Has low-grade fever with temp 100.5 by ear thermometer. Has malaise and fatigue.    Review of Systems     Objective:   Physical Exam   Pharynx slightly injected. TMs are clear. Neck is supple. Chest clear to auscultation without rales or wheezing. Has congested cough.   Plan: Biaxin 500 mg twice daily for 10 days. Hycodan 1 teaspoon by mouth every 8 hours when necessary cough.       Assessment & Plan:  Acute URI  Plan: Biaxin 500 mg twice daily for 10 days. Hycodan 1 teaspoon by mouth every 8 hours when necessary cough. Drink plenty of fluids and rest. Call if not better in 5-7 days or sooner if worse.

## 2014-12-12 NOTE — Patient Instructions (Signed)
Take Biaxin 500 mg twice daily for 10 days. Hycodan as needed for cough. Rest and drink plenty of fluids.

## 2014-12-12 NOTE — Patient Instructions (Addendum)
Have a protein and a carbohydrate for each meal and snack. Add an egg or [place] sausage to have with cereal oatmeal. Choose regular Cheerios and plain oatmeal (flavor with raisins, nuts, and splenda/stevia) Continue having lean protein that not's fried.  Limit sugar. Stevia/splenda are better choices. Choose whole grain bread and pasta and brown rice. Fill half of your plate with vegetables at dinner. Incorporate raw vegetables at snacks. Use frozen vegetables/pre washed salad green for quick vegetables. Have protein the size of the palm of your hand. Have carbohydrate on a quarter of your plate. Try having one carbohydrate per meal. Try a [facility] [employer] or yogurt at night if you are hungry or want something sweet. Try sugar free Hawaiian Punch and Diet Sun Drop.  Don't keep ice cream and cookies at home.  Plan to walk at the treadmill for 30 minutes each day.

## 2014-12-12 NOTE — Progress Notes (Signed)
  Medical Nutrition Therapy:  Appt start time: 3903 end time:  0092.   Assessment:  Primary concerns today: Joanna Ray is here today since she is overweight and was told recently that she has a fatty liver. Has been having more vegetables, more fruit, avoiding fried foods, and drinking more since she has been diagnosed in July. Used to eat more fried foods, ate cakes, and was not drinking water.   Works as a Pharmacist, hospital at [employer]. Lives with her husband and states that she does the food shopping and meal preparation. Skips lunch about 7 x week. Rarely eats out.  Would like to lose about 40 lbs. Has lost weight before by following the book "Eat to Live". Has not exercised in the last 6-7 months. Does not drink alcohol.   Was on a [facility] Diet recently where she was eating a lot of bacon and egg and grapefruit for 1 month several months. Has lost about 4 lbs since her doctor's appointment.   Preferred Learning Style:   No preference indicated   Learning Readiness:   Ready  MEDICATIONS: see list   DIETARY INTAKE:  Usual eating pattern includes 2 meals and 2-3 snacks per day.  Avoided foods include none.    24-hr recall:  B ( AM): Honey Nut Cheerios/flavored oatmeal, whole banana, almond milk and coffee or pancakes with eggs and bacon on the weekend Snk ( AM): apple, walnuts, or peanut butter cracker L ( PM):rarely eats lunch Snk ( PM): might have fruit or peanut butter jelly sandwich  D ( PM): chicken with brown rice, cooked apples, green beans or [place] burger or pinto beans, chicken, apples Snk ( PM): ice cream or cookies  Beverages: coffee, water, Hawaiian Punch sometimes, Sun Drop  Usual physical activity: none  Estimated energy needs: 1600 calories 180 g carbohydrates 120 g protein 44 g fat  Progress Towards Goal(s):  In progress.   Nutritional Diagnosis:  [place]-3.3 Overweight/obesity As related to hx of high fat and sugary foods.  As evidenced by BMI of 33.5 and fatty liver.  .    Intervention:  Nutrition counseling provided. Plan: Have a protein and a carbohydrate for each meal and snack. Add an egg or [place] sausage to have with cereal oatmeal. Choose regular Cheerios and plain oatmeal (flavor with raisins, nuts, and splenda/stevia) Continue having lean protein that not's fried.  Limit sugar. Stevia/splenda are better choices. Choose whole grain bread and pasta and brown rice. Fill half of your plate with vegetables at dinner. Incorporate raw vegetables at snacks. Use frozen vegetables/pre washed salad green for quick vegetables. Have protein the size of the palm of your hand. Have carbohydrate on a quarter of your plate. Try having one carbohydrate per meal. Try a [facility] [employer] or yogurt at night if you are hungry or want something sweet. Try sugar free Hawaiian Punch and Diet Sun Drop.  Don't keep ice cream and cookies at home.  Plan to walk at the treadmill for 30 minutes each day.  Teaching Method Utilized:  Visual Auditory Hands on  Handouts given during visit include:  MyPlate Handout  Meal Card  15 g CHO Snacks  Barriers to learning/adherence to lifestyle change: none  Demonstrated degree of understanding via:  Teach Back   Monitoring/Evaluation:  Dietary intake, exercise,  and body weight in 1 month(s).

## 2015-01-16 ENCOUNTER — Ambulatory Visit: Payer: No Typology Code available for payment source | Admitting: Dietician

## 2015-01-22 ENCOUNTER — Other Ambulatory Visit: Payer: No Typology Code available for payment source | Admitting: Internal Medicine

## 2015-01-22 DIAGNOSIS — Z79899 Other long term (current) drug therapy: Secondary | ICD-10-CM

## 2015-01-22 DIAGNOSIS — E785 Hyperlipidemia, unspecified: Secondary | ICD-10-CM

## 2015-01-22 LAB — HEPATIC FUNCTION PANEL
ALBUMIN: 4.1 g/dL (ref 3.6–5.1)
ALK PHOS: 39 U/L (ref 33–130)
ALT: 14 U/L (ref 6–29)
AST: 18 U/L (ref 10–35)
BILIRUBIN INDIRECT: 0.8 mg/dL (ref 0.2–1.2)
BILIRUBIN TOTAL: 1 mg/dL (ref 0.2–1.2)
Bilirubin, Direct: 0.2 mg/dL (ref <=0.2)
Total Protein: 7.2 g/dL (ref 6.1–8.1)

## 2015-01-22 LAB — LIPID PANEL
Cholesterol: 156 mg/dL (ref 125–200)
HDL: 50 mg/dL (ref >=46)
LDL Cholesterol: 87 mg/dL (ref <130)
TRIGLYCERIDES: 96 mg/dL (ref <150)
Total CHOL/HDL Ratio: 3.1 Ratio (ref <=5.0)
VLDL: 19 mg/dL (ref <30)

## 2015-01-24 ENCOUNTER — Encounter: Payer: Self-pay | Admitting: Internal Medicine

## 2015-01-24 ENCOUNTER — Ambulatory Visit (INDEPENDENT_AMBULATORY_CARE_PROVIDER_SITE_OTHER): Payer: No Typology Code available for payment source | Admitting: Internal Medicine

## 2015-01-24 VITALS — BP 128/78 | HR 57 | Temp 98.1°F | Ht 65.0 in | Wt 199.0 lb

## 2015-01-24 DIAGNOSIS — I10 Essential (primary) hypertension: Secondary | ICD-10-CM

## 2015-01-24 DIAGNOSIS — R748 Abnormal levels of other serum enzymes: Secondary | ICD-10-CM

## 2015-01-24 DIAGNOSIS — E785 Hyperlipidemia, unspecified: Secondary | ICD-10-CM | POA: Diagnosis not present

## 2015-01-25 NOTE — Progress Notes (Signed)
   Subjective:    Patient ID: Joanna Ray, female    DOB: 1950/08/05, 64 y.o.   MRN: [id number]  HPI  She is here to follow-up on hypertension and hyperlipidemia. Was here in July. She also has a history of mild elevation of liver functions which were to be repeated at this visit. Blood pressure stable at 128/78. She's lost 6 pounds since July. She is trying to eat right diet and exercise. Her liver functions have now normalized. She is on lisinopril/HCTZ  20/12.5. Takes Mobitz for musculoskeletal pain. Takes Crestor for hyperlipidemia.  She works in the school system. Does not generally take flu vaccine.    Review of Systems     Objective:   Physical Exam Chest clear. Cardiac exam regular rate and rhythm. Skin warm and dry. Extremities without edema.       Assessment & Plan:  Essential hypertension-stable   Hyperlipidemia-stable on statin therapy  History of mild elevation of liver enzymes-now normal  Plan: Return in February for fasting lab work and complete physical examination. Continue same medications.

## 2015-01-25 NOTE — Patient Instructions (Signed)
Congratulations on 6 pound weight loss. Continue diet exercise efforts. Return in February for physical exam. Continue same medications.

## 2015-01-31 ENCOUNTER — Ambulatory Visit: Payer: No Typology Code available for payment source | Admitting: Internal Medicine

## 2015-03-15 ENCOUNTER — Other Ambulatory Visit: Payer: Self-pay

## 2015-03-15 DIAGNOSIS — I10 Essential (primary) hypertension: Secondary | ICD-10-CM

## 2015-03-15 MED ORDER — LISINOPRIL-HYDROCHLOROTHIAZIDE 20-12.5 MG PO TABS: 1.0000 | ORAL_TABLET | Freq: Every day | ORAL | Status: DC

## 2015-03-18 ENCOUNTER — Other Ambulatory Visit: Payer: Self-pay

## 2015-03-18 DIAGNOSIS — I10 Essential (primary) hypertension: Secondary | ICD-10-CM

## 2015-03-18 MED ORDER — LISINOPRIL-HYDROCHLOROTHIAZIDE 20-12.5 MG PO TABS: 1.0000 | ORAL_TABLET | Freq: Every day | ORAL | Status: DC

## 2015-06-14 ENCOUNTER — Other Ambulatory Visit: Payer: BLUE CROSS/BLUE SHIELD | Admitting: Internal Medicine

## 2015-06-14 DIAGNOSIS — Z79899 Other long term (current) drug therapy: Secondary | ICD-10-CM

## 2015-06-14 DIAGNOSIS — Z0000 Encounter for general adult medical examination without abnormal findings: Secondary | ICD-10-CM

## 2015-06-14 DIAGNOSIS — E785 Hyperlipidemia, unspecified: Secondary | ICD-10-CM

## 2015-06-14 DIAGNOSIS — Z1329 Encounter for screening for other suspected endocrine disorder: Secondary | ICD-10-CM

## 2015-06-14 DIAGNOSIS — I10 Essential (primary) hypertension: Secondary | ICD-10-CM

## 2015-06-14 DIAGNOSIS — E559 Vitamin D deficiency, unspecified: Secondary | ICD-10-CM

## 2015-06-14 DIAGNOSIS — Z130 Encounter for screening for diseases of the blood and blood-forming organs and certain disorders involving the immune mechanism: Secondary | ICD-10-CM

## 2015-06-14 LAB — COMPLETE METABOLIC PANEL WITH GFR
ALT: 11 U/L (ref 6–29)
AST: 17 U/L (ref 10–35)
Albumin: 4.1 g/dL (ref 3.6–5.1)
Alkaline Phosphatase: 43 U/L (ref 33–130)
BILIRUBIN TOTAL: 0.7 mg/dL (ref 0.2–1.2)
BUN: 18 mg/dL (ref 7–25)
CALCIUM: 9.3 mg/dL (ref 8.6–10.4)
CO2: 29 mmol/L (ref 20–31)
CREATININE: 1.11 mg/dL — AB (ref 0.50–0.99)
Chloride: 106 mmol/L (ref 98–110)
GFR, EST AFRICAN AMERICAN: 61 mL/min (ref >=60)
GFR, Est Non African American: 53 mL/min — ABNORMAL LOW (ref >=60)
Glucose, Bld: 86 mg/dL (ref 65–99)
Potassium: 4.5 mmol/L (ref 3.5–5.3)
SODIUM: 142 mmol/L (ref 135–146)
TOTAL PROTEIN: 6.9 g/dL (ref 6.1–8.1)

## 2015-06-14 LAB — CBC WITH DIFFERENTIAL/PLATELET
Basophils Absolute: 0.1 10*3/uL (ref 0.0–0.1)
Basophils Relative: 3 % — ABNORMAL HIGH (ref 0–1)
Eosinophils Absolute: 0.1 10*3/uL (ref 0.0–0.7)
Eosinophils Relative: 2 % (ref 0–5)
HCT: 36.2 % (ref 36.0–46.0)
HEMOGLOBIN: 12 g/dL (ref 12.0–15.0)
LYMPHS ABS: 1.5 10*3/uL (ref 0.7–4.0)
Lymphocytes Relative: 39 % (ref 12–46)
MCH: 31.1 pg (ref 26.0–34.0)
MCHC: 33.1 g/dL (ref 30.0–36.0)
MCV: 93.8 fL (ref 78.0–100.0)
MONOS PCT: 11 % (ref 3–12)
MPV: 11 fL (ref 8.6–12.4)
Monocytes Absolute: 0.4 10*3/uL (ref 0.1–1.0)
NEUTROS ABS: 1.8 10*3/uL (ref 1.7–7.7)
NEUTROS PCT: 45 % (ref 43–77)
Platelets: 193 10*3/uL (ref 150–400)
RBC: 3.86 MIL/uL — ABNORMAL LOW (ref 3.87–5.11)
RDW: 14.7 % (ref 11.5–15.5)
WBC: 3.9 10*3/uL — ABNORMAL LOW (ref 4.0–10.5)

## 2015-06-14 LAB — LIPID PANEL
CHOLESTEROL: 174 mg/dL (ref 125–200)
HDL: 51 mg/dL (ref >=46)
LDL Cholesterol: 106 mg/dL (ref <130)
TRIGLYCERIDES: 85 mg/dL (ref <150)
Total CHOL/HDL Ratio: 3.4 Ratio (ref <=5.0)
VLDL: 17 mg/dL (ref <30)

## 2015-06-14 LAB — TSH: TSH: 1.11 mIU/L

## 2015-06-15 LAB — VITAMIN D 25 HYDROXY (VIT D DEFICIENCY, FRACTURES): Vit D, 25-Hydroxy: 42 ng/mL (ref 30–100)

## 2015-06-21 ENCOUNTER — Ambulatory Visit (INDEPENDENT_AMBULATORY_CARE_PROVIDER_SITE_OTHER): Payer: BLUE CROSS/BLUE SHIELD | Admitting: Internal Medicine

## 2015-06-21 ENCOUNTER — Encounter: Payer: Self-pay | Admitting: Internal Medicine

## 2015-06-21 ENCOUNTER — Other Ambulatory Visit: Payer: Self-pay | Admitting: Internal Medicine

## 2015-06-21 VITALS — BP 118/80 | HR 60 | Temp 97.3°F | Resp 20 | Ht 65.0 in | Wt 199.0 lb

## 2015-06-21 DIAGNOSIS — I10 Essential (primary) hypertension: Secondary | ICD-10-CM

## 2015-06-21 DIAGNOSIS — N951 Menopausal and female climacteric states: Secondary | ICD-10-CM | POA: Diagnosis not present

## 2015-06-21 DIAGNOSIS — Z0000 Encounter for general adult medical examination without abnormal findings: Secondary | ICD-10-CM | POA: Diagnosis not present

## 2015-06-21 DIAGNOSIS — Z1231 Encounter for screening mammogram for malignant neoplasm of breast: Secondary | ICD-10-CM

## 2015-06-21 DIAGNOSIS — Z23 Encounter for immunization: Secondary | ICD-10-CM | POA: Diagnosis not present

## 2015-06-21 DIAGNOSIS — E785 Hyperlipidemia, unspecified: Secondary | ICD-10-CM | POA: Diagnosis not present

## 2015-06-21 DIAGNOSIS — R232 Flushing: Secondary | ICD-10-CM

## 2015-06-21 LAB — POCT URINALYSIS DIPSTICK
BILIRUBIN UA: NEGATIVE
Blood, UA: NEGATIVE
GLUCOSE UA: NEGATIVE
KETONES UA: NEGATIVE
Leukocytes, UA: NEGATIVE
Nitrite, UA: NEGATIVE
Protein, UA: NEGATIVE
UROBILINOGEN UA: 0.2
pH, UA: 7.5

## 2015-06-21 MED ORDER — CONJ ESTROG-MEDROXYPROGEST ACE 0.3-1.5 MG PO TABS: 1.0000 | ORAL_TABLET | Freq: Every day | ORAL | Status: DC

## 2015-06-21 MED ORDER — ROSUVASTATIN CALCIUM 20 MG PO TABS: 10.0000 mg | ORAL_TABLET | Freq: Every day | ORAL | Status: DC

## 2015-06-21 MED ORDER — LISINOPRIL-HYDROCHLOROTHIAZIDE 20-12.5 MG PO TABS: 1.0000 | ORAL_TABLET | Freq: Every day | ORAL | Status: DC

## 2015-06-21 NOTE — Progress Notes (Signed)
   Subjective:    Patient ID: Joanna Ray, female    DOB: 04-13-1951, 65 y.o.   MRN: [id number]  HPI Pleasant 65 year old Black Female in today for health maintenance exam and evaluation of medical problems. Is having a lot of hot flashes that are not being controlled with black cohosh. Says there is a family history of breast cancer but she would like to consider estrogen replacement. Having difficulty sleeping at night because of hot flashes. She has a history of essential hypertension and hyperlipidemia which are well controlled.  No history of operations or accidents.  No known drug allergies.  Social history: She teaches at [employer]. Husband is retired. He has congestive heart failure. Patient has not smoked for over 20 years. No alcohol consumption. Her sister is Baird Lyons. She has 3 adult sons and one adult daughter.  Family history: Father died at age 74 of a stroke. Mother died at age 78 with history of stroke and 32 with history of hypertension. One brother with history of hypertension and MI. One sister age 8 with diabetes. One sister age 4 with hypertension. Another sister age 72 with hypertension. One son age 40 with hypertension.    Review of Systems history of sciatica but no complaint about this today. Main complaint is hot flashes     Objective:   Physical Exam  Constitutional: She is oriented to person, place, and time. She appears well-developed and well-nourished. No distress.  HENT:  Head: Normocephalic and atraumatic.  Right Ear: External ear normal.  Left Ear: External ear normal.  Mouth/Throat: Oropharynx is clear and moist. No oropharyngeal exudate.  Eyes: Conjunctivae and EOM are normal. Pupils are equal, round, and reactive to light. Right eye exhibits no discharge. Left eye exhibits no discharge. No scleral icterus.  Neck: Neck supple. No JVD present. No thyromegaly present.  Cardiovascular: Normal rate, regular rhythm, normal heart sounds and  intact distal pulses.   No murmur heard. Pulmonary/Chest: Effort normal and breath sounds normal. She has no wheezes. She has no rales.  Breasts normal female without masses. Some slight thickening at 8:00 in the left breast. Mammogram due soon. Order placed.  Abdominal: Soft. Bowel sounds are normal. She exhibits no distension and no mass. There is no tenderness. There is no rebound and no guarding.  Genitourinary:  Pap taken in 2015. Bimanual normal.  Musculoskeletal: Normal range of motion. She exhibits no edema.  Lymphadenopathy:    She has no cervical adenopathy.  Neurological: She is alert and oriented to person, place, and time. She has normal reflexes. No cranial nerve deficit. Coordination normal.  Skin: Skin is warm and dry. No rash noted. She is not diaphoretic.  Psychiatric: She has a normal mood and affect. Her behavior is normal. Judgment and thought content normal.  Vitals reviewed.         Assessment & Plan:  Essential hypertension-stable on current regimen  Hyperlipidemia-stable on Crestor 20 mg daily. Lipid panel within normal limits. Liver functions normal.  Elevated serum creatinine at 1.11 were be repeated in 6 months. Has had hypertension for 14 years. She is on lisinopril HCTZ  Hot flashes-start Prempro low dose daily 0.3/1.5  Fibrocystic breast disease- to have mammogram in the near future  Health maintenance tetanus immunization update given today

## 2015-06-21 NOTE — Patient Instructions (Addendum)
Prempro 0.3/1.5 mg daily. Continue other meds as prescribed. Have mammogram. Tetanus immunization given. Return in 6 months. It was a pleasure to see you today.

## 2015-06-24 ENCOUNTER — Other Ambulatory Visit: Payer: Self-pay | Admitting: Internal Medicine

## 2015-06-24 DIAGNOSIS — N6459 Other signs and symptoms in breast: Secondary | ICD-10-CM

## 2015-07-01 ENCOUNTER — Ambulatory Visit
Admission: RE | Admit: 2015-07-01 | Discharge: 2015-07-01 | Disposition: A | Discharge status: Home / Self Care | Payer: BLUE CROSS/BLUE SHIELD | Source: Ambulatory Visit | Attending: Internal Medicine | Admitting: Internal Medicine

## 2015-07-01 DIAGNOSIS — N6459 Other signs and symptoms in breast: Secondary | ICD-10-CM

## 2015-12-17 ENCOUNTER — Other Ambulatory Visit: Payer: BLUE CROSS/BLUE SHIELD | Admitting: Internal Medicine

## 2015-12-20 ENCOUNTER — Ambulatory Visit: Payer: BLUE CROSS/BLUE SHIELD | Admitting: Internal Medicine

## 2015-12-23 ENCOUNTER — Ambulatory Visit: Payer: BLUE CROSS/BLUE SHIELD | Admitting: Internal Medicine

## 2015-12-27 ENCOUNTER — Ambulatory Visit: Payer: BLUE CROSS/BLUE SHIELD | Admitting: Internal Medicine

## 2015-12-27 DIAGNOSIS — Z029 Encounter for administrative examinations, unspecified: Secondary | ICD-10-CM

## 2016-07-09 ENCOUNTER — Other Ambulatory Visit: Payer: Self-pay | Admitting: Internal Medicine

## 2016-07-09 DIAGNOSIS — I10 Essential (primary) hypertension: Secondary | ICD-10-CM

## 2016-07-09 NOTE — Telephone Encounter (Signed)
Refill through April only. Has CPE in April.

## 2016-07-15 ENCOUNTER — Other Ambulatory Visit: Payer: Self-pay | Admitting: Internal Medicine

## 2016-07-15 DIAGNOSIS — I10 Essential (primary) hypertension: Secondary | ICD-10-CM

## 2016-07-31 ENCOUNTER — Other Ambulatory Visit: Payer: Medicare Other | Admitting: Internal Medicine

## 2016-07-31 DIAGNOSIS — E785 Hyperlipidemia, unspecified: Secondary | ICD-10-CM

## 2016-07-31 DIAGNOSIS — Z0000 Encounter for general adult medical examination without abnormal findings: Secondary | ICD-10-CM | POA: Diagnosis not present

## 2016-07-31 DIAGNOSIS — Z1321 Encounter for screening for nutritional disorder: Secondary | ICD-10-CM | POA: Diagnosis not present

## 2016-07-31 DIAGNOSIS — I10 Essential (primary) hypertension: Secondary | ICD-10-CM | POA: Diagnosis not present

## 2016-07-31 DIAGNOSIS — E559 Vitamin D deficiency, unspecified: Secondary | ICD-10-CM | POA: Diagnosis not present

## 2016-07-31 DIAGNOSIS — Z1329 Encounter for screening for other suspected endocrine disorder: Secondary | ICD-10-CM | POA: Diagnosis not present

## 2016-07-31 LAB — COMPREHENSIVE METABOLIC PANEL
ALT: 10 U/L (ref 6–29)
AST: 15 U/L (ref 10–35)
Albumin: 4.1 g/dL (ref 3.6–5.1)
Alkaline Phosphatase: 40 U/L (ref 33–130)
BUN: 16 mg/dL (ref 7–25)
CHLORIDE: 104 mmol/L (ref 98–110)
CO2: 29 mmol/L (ref 20–31)
CREATININE: 1.01 mg/dL — AB (ref 0.50–0.99)
Calcium: 9.4 mg/dL (ref 8.6–10.4)
Glucose, Bld: 91 mg/dL (ref 65–99)
Potassium: 4.3 mmol/L (ref 3.5–5.3)
SODIUM: 141 mmol/L (ref 135–146)
Total Bilirubin: 0.8 mg/dL (ref 0.2–1.2)
Total Protein: 7.2 g/dL (ref 6.1–8.1)

## 2016-07-31 LAB — LIPID PANEL
Cholesterol: 154 mg/dL (ref <200)
HDL: 50 mg/dL — ABNORMAL LOW (ref >50)
LDL CALC: 83 mg/dL (ref <100)
TRIGLYCERIDES: 106 mg/dL (ref <150)
Total CHOL/HDL Ratio: 3.1 Ratio (ref <5.0)
VLDL: 21 mg/dL (ref <30)

## 2016-07-31 LAB — CBC WITH DIFFERENTIAL/PLATELET
BASOS ABS: 90 {cells}/uL (ref 0–200)
Basophils Relative: 2 %
EOS PCT: 1 %
Eosinophils Absolute: 45 cells/uL (ref 15–500)
HCT: 38.4 % (ref 35.0–45.0)
Hemoglobin: 12.8 g/dL (ref 11.7–15.5)
Lymphocytes Relative: 34 %
Lymphs Abs: 1530 cells/uL (ref 850–3900)
MCH: 30.5 pg (ref 27.0–33.0)
MCHC: 33.3 g/dL (ref 32.0–36.0)
MCV: 91.6 fL (ref 80.0–100.0)
MONOS PCT: 11 %
MPV: 10.5 fL (ref 7.5–12.5)
Monocytes Absolute: 495 cells/uL (ref 200–950)
NEUTROS ABS: 2340 {cells}/uL (ref 1500–7800)
NEUTROS PCT: 52 %
PLATELETS: 240 10*3/uL (ref 140–400)
RBC: 4.19 MIL/uL (ref 3.80–5.10)
RDW: 13.8 % (ref 11.0–15.0)
WBC: 4.5 10*3/uL (ref 3.8–10.8)

## 2016-07-31 LAB — TSH: TSH: 0.98 mIU/L

## 2016-08-01 LAB — VITAMIN D 25 HYDROXY (VIT D DEFICIENCY, FRACTURES): VIT D 25 HYDROXY: 43 ng/mL (ref 30–100)

## 2016-08-07 ENCOUNTER — Ambulatory Visit (INDEPENDENT_AMBULATORY_CARE_PROVIDER_SITE_OTHER): Payer: Medicare Other | Admitting: Internal Medicine

## 2016-08-07 ENCOUNTER — Other Ambulatory Visit (HOSPITAL_COMMUNITY)
Admission: RE | Admit: 2016-08-07 | Discharge: 2016-08-07 | Disposition: A | Discharge status: Home / Self Care | Payer: Medicare Other | Source: Ambulatory Visit | Attending: Internal Medicine | Admitting: Internal Medicine

## 2016-08-07 VITALS — BP 132/82 | HR 66 | Temp 97.0°F | Ht 65.0 in | Wt 198.0 lb

## 2016-08-07 DIAGNOSIS — Z124 Encounter for screening for malignant neoplasm of cervix: Secondary | ICD-10-CM | POA: Diagnosis not present

## 2016-08-07 DIAGNOSIS — H8110 Benign paroxysmal vertigo, unspecified ear: Secondary | ICD-10-CM | POA: Diagnosis not present

## 2016-08-07 DIAGNOSIS — I10 Essential (primary) hypertension: Secondary | ICD-10-CM | POA: Diagnosis not present

## 2016-08-07 DIAGNOSIS — B9689 Other specified bacterial agents as the cause of diseases classified elsewhere: Secondary | ICD-10-CM | POA: Diagnosis not present

## 2016-08-07 DIAGNOSIS — E784 Other hyperlipidemia: Secondary | ICD-10-CM

## 2016-08-07 DIAGNOSIS — Z23 Encounter for immunization: Secondary | ICD-10-CM | POA: Diagnosis not present

## 2016-08-07 DIAGNOSIS — E7849 Other hyperlipidemia: Secondary | ICD-10-CM

## 2016-08-07 DIAGNOSIS — Z0000 Encounter for general adult medical examination without abnormal findings: Secondary | ICD-10-CM

## 2016-08-07 DIAGNOSIS — R232 Flushing: Secondary | ICD-10-CM

## 2016-08-07 DIAGNOSIS — N760 Acute vaginitis: Secondary | ICD-10-CM | POA: Diagnosis not present

## 2016-08-07 LAB — POCT URINALYSIS DIPSTICK
Bilirubin, UA: NEGATIVE
GLUCOSE UA: NEGATIVE
Ketones, UA: NEGATIVE
Leukocytes, UA: NEGATIVE
Nitrite, UA: NEGATIVE
PH UA: 6 (ref 5.0–8.0)
Spec Grav, UA: 1.015 (ref 1.010–1.025)
UROBILINOGEN UA: 0.2 U/dL

## 2016-08-07 LAB — POCT WET PREP (WET MOUNT)

## 2016-08-07 MED ORDER — AZITHROMYCIN 250 MG PO TABS: ORAL_TABLET | ORAL | 0 refills | Status: DC

## 2016-08-07 MED ORDER — MECLIZINE HCL 25 MG PO TABS: 25.0000 mg | ORAL_TABLET | Freq: Three times a day (TID) | ORAL | 0 refills | Status: DC | PRN

## 2016-08-07 MED ORDER — CLINDAMYCIN PHOSPHATE 2 % VA CREA: TOPICAL_CREAM | VAGINAL | 0 refills | Status: DC

## 2016-08-07 NOTE — Progress Notes (Signed)
Subjective:    Patient ID: Joanna Ray, female    DOB: 04/07/1951, 66 y.o.   MRN: [id number]  HPI   66 year old Black Female for health maintenance exam and evaluation of medical issues.This is Welcome to Medicare physical exam.  She has a history of essential hypertension and hyperlipidemia which are well controlled.  No history of operations or accidents.  No known drug allergies.  Social history: She teaches at [employer]. Husband is retired. He has congestive heart failure. Patient has not smoked for over 20 years. No alcohol consumption. Her sisters Ainsley Spinner. She has 3 adult sons and one adult daughter.  Family history: Father died at age 40 of a stroke. Mother died at age 59 with history of stroke in 2010. One brother with history of hypertension and MI. One sister age 3 with diabetes. One sister age 93 with hypertension and another sister age 24 with hypertension. Son with history of hypertension.    Review of Systems see above     Objective:   Physical Exam  Constitutional: She is oriented to person, place, and time. She appears well-developed and well-nourished. No distress.  HENT:  Head: Normocephalic and atraumatic.  Right Ear: External ear normal.  Left Ear: External ear normal.  Mouth/Throat: Oropharynx is clear and moist. No oropharyngeal exudate.  Eyes: Conjunctivae and EOM are normal. Pupils are equal, round, and reactive to light. Right eye exhibits no discharge. Left eye exhibits no discharge. No scleral icterus.  Neck: Neck supple. No thyromegaly present.  Cardiovascular: Normal rate, regular rhythm, normal heart sounds and intact distal pulses.   No murmur heard. Pulmonary/Chest: Effort normal and breath sounds normal. She has no wheezes. She has no rales.  Abdominal: Soft. Bowel sounds are normal. She exhibits no distension and no mass. There is no tenderness. There is no rebound and no guarding.  Genitourinary:  Genitourinary Comments: Pap  taken. Bimanual normal.  Musculoskeletal: She exhibits no edema.  Neurological: She is alert and oriented to person, place, and time. No cranial nerve deficit. Coordination normal.  Skin: No rash noted. She is not diaphoretic.  Psychiatric: She has a normal mood and affect. Her behavior is normal. Judgment and thought content normal.  Vitals reviewed.         Assessment & Plan:  Recent issues with vertigo and URI symptoms. Treated with Antivert and Zithromax Z-Pak.  Hyperlipidemia treated with Crestor  Essential hypertension treated with Zestoretic   Intertrigo-treated with Nizoral cream  Health maintenance-Prevnar 13 given.  Hot flashes treated with Prempro  Vaginal discharge-treated with Cleocin vaginal cream for bacterial vaginosis wet prep shows clue cells  History of elevated creatinine but creatinine is stable at 1.01 and previously 1 year ago was 1.11  Subjective:   Patient presents for Medicare Annual/Subsequent preventive examination.  Review Past Medical/Family/Social:See above   Risk Factors  Current exercise habits: Light exercise Dietary issues discussed: Low fat low carbohydrate  Cardiac risk factors:Hyperlipidemia  Depression Screen  (Note: if answer to either of the following is "Yes", a more complete depression screening is indicated)   Over the past two weeks, have you felt down, depressed or hopeless? No  Over the past two weeks, have you felt little interest or pleasure in doing things? No Have you lost interest or pleasure in daily life? No Do you often feel hopeless? No Do you cry easily over simple problems? No   Activities of Daily Living  In your present state of health, do  you have any difficulty performing the following activities?:   Driving? No  Managing money? No  Feeding yourself? No  Getting from bed to chair? No  Climbing a flight of stairs? No  Preparing food and eating?: No  Bathing or showering? No  Getting dressed: No    Getting to the toilet? No  Using the toilet:No  Moving around from place to place: No  In the past year have you fallen or had a near fall?:No  Are you sexually active? yes Do you have more than one partner? No   Hearing Difficulties: No  Do you often ask people to speak up or repeat themselves? No  Do you experience ringing or noises in your ears? Sometimes Do you have difficulty understanding soft or whispered voices? No  Do you feel that you have a problem with memory? No Do you often misplace items? keys   Home Safety:  Do you have a smoke alarm at your residence? Yes Do you have grab bars in the bathroom? No Do you have throw rugs in your house? No   Cognitive Testing  Alert? Yes Normal Appearance?Yes  Oriented to person? Yes Place? Yes  Time? Yes  Recall of three objects? Yes  Can perform simple calculations? Yes  Displays appropriate judgment?Yes  Can read the correct time from a watch face?Yes   List the Names of Other Physician/Practitioners you currently use:  See referral list for the physicians patient is currently seeing.     Review of Systems: See above   Objective:     General appearance: Appears stated age and mildly obese  Head: Normocephalic, without obvious abnormality, atraumatic  Eyes: conj clear, EOMi PEERLA  Ears: normal TM's and external ear canals both ears  Nose: Nares normal. Septum midline. Mucosa normal. No drainage or sinus tenderness.  Throat: lips, mucosa, and tongue normal; teeth and gums normal  Neck: no adenopathy, no carotid bruit, no JVD, supple, symmetrical, trachea midline and thyroid not enlarged, symmetric, no tenderness/mass/nodules  No CVA tenderness.  Lungs: clear to auscultation bilaterally  Breasts: normal appearance, no masses or tenderness,  Heart: regular rate and rhythm, S1, S2 normal, no murmur, click, rub or gallop  Abdomen: soft, non-tender; bowel sounds normal; no masses, no organomegaly  Musculoskeletal: ROM  normal in all joints, no crepitus, no deformity, Normal muscle strengthen. Back  is symmetric, no curvature. Skin: Skin color, texture, turgor normal. No rashes or lesions  Lymph nodes: Cervical, supraclavicular, and axillary nodes normal.  Neurologic: CN 2 -12 Normal, Normal symmetric reflexes. Normal coordination and gait  Psych: Alert & Oriented x 3, Mood appear stable.    Assessment:    Annual wellness medicare exam   Plan:    During the course of the visit the patient was educated and counseled about appropriate screening and preventive services including:   Prevnar 13     Patient Instructions (the written plan) was given to the patient.  Medicare Attestation  I have personally reviewed:  The patient's medical and social history  Their use of alcohol, tobacco or illicit drugs  Their current medications and supplements  The patient's functional ability including ADLs,fall risks, home safety risks, cognitive, and hearing and visual impairment  Diet and physical activities  Evidence for depression or mood disorders  The patient's weight, height, BMI, and visual acuity have been recorded in the chart. I have made referrals, counseling, and provided education to the patient based on review of the above and I have provided  the patient with a written personalized care plan for preventive services.

## 2016-08-11 LAB — CYTOLOGY - PAP: Diagnosis: NEGATIVE

## 2016-08-20 ENCOUNTER — Other Ambulatory Visit: Payer: Self-pay

## 2016-08-20 MED ORDER — CONJ ESTROG-MEDROXYPROGEST ACE 0.3-1.5 MG PO TABS: 1.0000 | ORAL_TABLET | Freq: Every day | ORAL | 99 refills | Status: DC

## 2016-08-23 ENCOUNTER — Encounter: Payer: Self-pay | Admitting: Internal Medicine

## 2016-08-23 NOTE — Patient Instructions (Signed)
He is Cleocin vaginal cream for bacterial vaginosis. Continue same medications and return in 6 months. Take Antivert and Zithromax for vertigo.

## 2016-09-14 ENCOUNTER — Encounter: Payer: Self-pay | Admitting: Internal Medicine

## 2016-09-14 ENCOUNTER — Ambulatory Visit (INDEPENDENT_AMBULATORY_CARE_PROVIDER_SITE_OTHER): Payer: Medicare Other | Admitting: Internal Medicine

## 2016-09-14 VITALS — BP 108/64 | HR 59 | Temp 97.7°F | Ht 65.0 in | Wt 193.0 lb

## 2016-09-14 DIAGNOSIS — I10 Essential (primary) hypertension: Secondary | ICD-10-CM | POA: Diagnosis not present

## 2016-09-14 DIAGNOSIS — H8110 Benign paroxysmal vertigo, unspecified ear: Secondary | ICD-10-CM

## 2016-09-14 DIAGNOSIS — F439 Reaction to severe stress, unspecified: Secondary | ICD-10-CM | POA: Diagnosis not present

## 2016-09-14 MED ORDER — ALPRAZOLAM 0.25 MG PO TABS: 0.2500 mg | ORAL_TABLET | Freq: Three times a day (TID) | ORAL | 0 refills | Status: DC | PRN

## 2016-09-14 NOTE — Patient Instructions (Signed)
Take Xanax 0.25 mg up to 3 times daily as needed for vertigo, anxiety and insomnia. Return in 2 weeks.
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
## 2016-09-28 ENCOUNTER — Ambulatory Visit (INDEPENDENT_AMBULATORY_CARE_PROVIDER_SITE_OTHER): Payer: Medicare Other | Admitting: Internal Medicine

## 2016-09-28 ENCOUNTER — Encounter: Payer: Self-pay | Admitting: Internal Medicine

## 2016-09-28 VITALS — BP 134/80 | HR 58 | Temp 97.9°F | Wt 196.0 lb

## 2016-09-28 DIAGNOSIS — R42 Dizziness and giddiness: Secondary | ICD-10-CM | POA: Insufficient documentation

## 2016-09-28 DIAGNOSIS — H8110 Benign paroxysmal vertigo, unspecified ear: Secondary | ICD-10-CM | POA: Diagnosis not present

## 2016-09-28 DIAGNOSIS — F439 Reaction to severe stress, unspecified: Secondary | ICD-10-CM
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
## 2016-09-28 NOTE — Patient Instructions (Signed)
Continue Xanax twice daily sparingly. Return as needed.

## 2016-11-18 ENCOUNTER — Other Ambulatory Visit: Payer: Self-pay | Admitting: Internal Medicine

## 2016-11-18 DIAGNOSIS — I10 Essential (primary) hypertension: Secondary | ICD-10-CM

## 2016-12-04 ENCOUNTER — Telehealth: Payer: Self-pay

## 2016-12-04 MED ORDER — ALPRAZOLAM 0.25 MG PO TABS: 0.2500 mg | ORAL_TABLET | Freq: Three times a day (TID) | ORAL | 5 refills | Status: DC | PRN

## 2016-12-04 NOTE — Telephone Encounter (Signed)
Received fax from [facility] in regards to a refill on Xanax 0.25mg  for patient. Medication was refilled per Dr. Verlene Mayer request. Sent 6 month supply

## 2016-12-16 ENCOUNTER — Emergency Department (HOSPITAL_COMMUNITY)
Admission: EM | Admit: 2016-12-16 | Discharge: 2016-12-16 | Discharge status: Against Medical Advice | Payer: Medicare Other | Attending: Emergency Medicine | Admitting: Emergency Medicine

## 2016-12-16 ENCOUNTER — Emergency Department (HOSPITAL_COMMUNITY): Payer: Medicare Other

## 2016-12-16 ENCOUNTER — Encounter (HOSPITAL_COMMUNITY): Payer: Self-pay | Admitting: *Deleted

## 2016-12-16 DIAGNOSIS — R42 Dizziness and giddiness: Secondary | ICD-10-CM | POA: Insufficient documentation

## 2016-12-16 DIAGNOSIS — I10 Essential (primary) hypertension: Secondary | ICD-10-CM | POA: Insufficient documentation

## 2016-12-16 DIAGNOSIS — R0600 Dyspnea, unspecified: Secondary | ICD-10-CM | POA: Diagnosis not present

## 2016-12-16 DIAGNOSIS — Z7982 Long term (current) use of aspirin: Secondary | ICD-10-CM | POA: Diagnosis not present

## 2016-12-16 DIAGNOSIS — Z5320 Procedure and treatment not carried out because of patient's decision for unspecified reasons: Secondary | ICD-10-CM | POA: Insufficient documentation

## 2016-12-16 DIAGNOSIS — R0602 Shortness of breath: Secondary | ICD-10-CM | POA: Diagnosis not present

## 2016-12-16 DIAGNOSIS — Z79899 Other long term (current) drug therapy: Secondary | ICD-10-CM | POA: Insufficient documentation

## 2016-12-16 LAB — URINALYSIS, ROUTINE W REFLEX MICROSCOPIC
Bacteria, UA: NONE SEEN
Bilirubin Urine: NEGATIVE
Glucose, UA: NEGATIVE mg/dL
HGB URINE DIPSTICK: NEGATIVE
Ketones, ur: NEGATIVE mg/dL
Nitrite: NEGATIVE
PROTEIN: NEGATIVE mg/dL
Specific Gravity, Urine: 1.003 — ABNORMAL LOW (ref 1.005–1.030)
pH: 7 (ref 5.0–8.0)

## 2016-12-16 LAB — CBC
HEMATOCRIT: 37.4 % (ref 36.0–46.0)
HEMOGLOBIN: 12.5 g/dL (ref 12.0–15.0)
MCH: 30 pg (ref 26.0–34.0)
MCHC: 33.4 g/dL (ref 30.0–36.0)
MCV: 89.9 fL (ref 78.0–100.0)
Platelets: 205 10*3/uL (ref 150–400)
RBC: 4.16 MIL/uL (ref 3.87–5.11)
RDW: 12.9 % (ref 11.5–15.5)
WBC: 5.2 10*3/uL (ref 4.0–10.5)

## 2016-12-16 LAB — BASIC METABOLIC PANEL
ANION GAP: 9 (ref 5–15)
BUN: 13 mg/dL (ref 6–20)
CHLORIDE: 104 mmol/L (ref 101–111)
CO2: 27 mmol/L (ref 22–32)
Calcium: 9.2 mg/dL (ref 8.9–10.3)
Creatinine, Ser: 1.04 mg/dL — ABNORMAL HIGH (ref 0.44–1.00)
GFR calc non Af Amer: 55 mL/min — ABNORMAL LOW (ref >60)
Glucose, Bld: 105 mg/dL — ABNORMAL HIGH (ref 65–99)
POTASSIUM: 3.5 mmol/L (ref 3.5–5.1)
Sodium: 140 mmol/L (ref 135–145)

## 2016-12-16 LAB — HEPATIC FUNCTION PANEL
ALBUMIN: 3.9 g/dL (ref 3.5–5.0)
ALK PHOS: 44 U/L (ref 38–126)
ALT: 18 U/L (ref 14–54)
AST: 24 U/L (ref 15–41)
BILIRUBIN TOTAL: 0.7 mg/dL (ref 0.3–1.2)
Bilirubin, Direct: 0.1 mg/dL (ref 0.1–0.5)
Indirect Bilirubin: 0.6 mg/dL (ref 0.3–0.9)
Total Protein: 7.2 g/dL (ref 6.5–8.1)

## 2016-12-16 LAB — I-STAT TROPONIN, ED: Troponin i, poc: 0 ng/mL (ref 0.00–0.08)

## 2016-12-16 LAB — CBG MONITORING, ED
GLUCOSE-CAPILLARY: 95 mg/dL (ref 65–99)
Glucose-Capillary: 95 mg/dL (ref 65–99)
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
## 2016-12-16 NOTE — ED Triage Notes (Addendum)
Pt states arrived home from vacation on Sat.  Yesterday she just wasn't feeling well.  This am when she was driving, she began experiencing sob, dizziness, L arm numbness and a sense of uneasiness.  States vacation spot was a 5 hour drive away.  No longer feels numbness to L arm.

## 2016-12-16 NOTE — ED Notes (Signed)
Walked patient to the bathroom patient did well 

## 2016-12-16 NOTE — ED Notes (Signed)
Pt wanting to leave AMA due to needing to be at work by 3.  Pt states "I will get wrote up if I'm late".  Pt encouraged to stay and have wait for lab results.

## 2017-01-29 ENCOUNTER — Other Ambulatory Visit: Payer: Medicare Other | Admitting: Internal Medicine

## 2017-02-05 ENCOUNTER — Ambulatory Visit: Payer: Medicare Other | Admitting: Internal Medicine

## 2017-02-09 ENCOUNTER — Other Ambulatory Visit: Payer: Medicare Other | Admitting: Internal Medicine

## 2017-02-09 DIAGNOSIS — E785 Hyperlipidemia, unspecified: Secondary | ICD-10-CM

## 2017-02-09 DIAGNOSIS — I10 Essential (primary) hypertension: Secondary | ICD-10-CM | POA: Diagnosis not present

## 2017-02-09 LAB — LIPID PANEL
CHOL/HDL RATIO: 2.6 (calc) (ref <5.0)
Cholesterol: 166 mg/dL (ref <200)
HDL: 65 mg/dL (ref >50)
LDL Cholesterol (Calc): 84 mg/dL (calc)
NON-HDL CHOLESTEROL (CALC): 101 mg/dL (ref <130)
TRIGLYCERIDES: 78 mg/dL (ref <150)

## 2017-02-09 LAB — HEPATIC FUNCTION PANEL
AG RATIO: 1.4 (calc) (ref 1.0–2.5)
ALKALINE PHOSPHATASE (APISO): 49 U/L (ref 33–130)
ALT: 8 U/L (ref 6–29)
AST: 14 U/L (ref 10–35)
Albumin: 4.1 g/dL (ref 3.6–5.1)
BILIRUBIN TOTAL: 0.7 mg/dL (ref 0.2–1.2)
Bilirubin, Direct: 0.1 mg/dL (ref 0.0–0.2)
Globulin: 3 g/dL (calc) (ref 1.9–3.7)
Indirect Bilirubin: 0.6 mg/dL (calc) (ref 0.2–1.2)
Total Protein: 7.1 g/dL (ref 6.1–8.1)

## 2017-02-09 LAB — BASIC METABOLIC PANEL WITH GFR
BUN / CREAT RATIO: 16 (calc) (ref 6–22)
BUN: 17 mg/dL (ref 7–25)
CHLORIDE: 104 mmol/L (ref 98–110)
CO2: 32 mmol/L (ref 20–32)
Calcium: 9.4 mg/dL (ref 8.6–10.4)
Creat: 1.05 mg/dL — ABNORMAL HIGH (ref 0.50–0.99)
GFR, EST AFRICAN AMERICAN: 65 mL/min/{1.73_m2} (ref >=60)
GFR, Est Non African American: 56 mL/min/{1.73_m2} — ABNORMAL LOW (ref >=60)
Glucose, Bld: 88 mg/dL (ref 65–99)
POTASSIUM: 4.5 mmol/L (ref 3.5–5.3)
Sodium: 141 mmol/L (ref 135–146)

## 2017-02-12 ENCOUNTER — Ambulatory Visit (INDEPENDENT_AMBULATORY_CARE_PROVIDER_SITE_OTHER): Payer: Medicare Other | Admitting: Internal Medicine

## 2017-02-12 ENCOUNTER — Encounter: Payer: Self-pay | Admitting: Internal Medicine

## 2017-02-12 VITALS — BP 138/70 | HR 60 | Temp 97.6°F | Wt 199.0 lb

## 2017-02-12 DIAGNOSIS — Z23 Encounter for immunization: Secondary | ICD-10-CM | POA: Diagnosis not present

## 2017-02-12 DIAGNOSIS — I10 Essential (primary) hypertension: Secondary | ICD-10-CM

## 2017-02-12 DIAGNOSIS — R7989 Other specified abnormal findings of blood chemistry: Secondary | ICD-10-CM | POA: Diagnosis not present

## 2017-02-12 DIAGNOSIS — E7849 Other hyperlipidemia: Secondary | ICD-10-CM | POA: Diagnosis not present

## 2017-02-12 DIAGNOSIS — Z6832 Body mass index (BMI) 32.0-32.9, adult: Secondary | ICD-10-CM | POA: Diagnosis not present

## 2017-02-12 NOTE — Progress Notes (Signed)
   Subjective:    Patient ID: Joanna Ray, female    DOB: Aug 13, 1950, 66 y.o.   MRN: [id number]  HPI 66 year old Female for 6 month follow up of medical issues.  She has a history of essential hypertension and hyperlipidemia.  She is on lisinopril HCTZ and Crestor.  Has concerns about Crestor causing dementia.  She teaches at [employer].  Her sister is Baird Lyons.      Review of Systems no new complaints-vertigo has resolved     Objective:   Physical Exam Skin warm and dry.  Nodes none.  Neck is supple without JVD thyromegaly or carotid bruits.  Chest clear to auscultation.  Cardiac exam regular rate and rhythm normal S1 and 2 without murmurs or gallops.  Extremities without edema.  Lab work reviewed.  Lipid panel liver functions entirely within normal limits.  Creatinine is 1.85 and previously was 1.042 months ago.  In April 2017 creatinine was 1.11 and in April 2018 it was 1.01.  Continue to monitor.  Blood pressure under good control at 138/70.  BMI is 32.61.       Assessment & Plan:  BMI 32.61-encourage diet exercise and weight loss  Essential hypertension-stable on current regimen  Mildly elevated serum creatinine continue to monitor  Hyperlipidemia-lipid panel liver functions normal on statin medication  Plan: Return in 6 months for physical examination.  Try to lose a bit of weight.  Continue same medications.

## 2017-02-14 NOTE — Patient Instructions (Addendum)
Flu vaccine given.  It was a pleasure to see you today.  Continue same medications and follow-up in 6 months.  Recommend diet exercise and weight loss.

## 2017-02-16 ENCOUNTER — Other Ambulatory Visit: Payer: Self-pay | Admitting: Internal Medicine

## 2017-02-16 DIAGNOSIS — I10 Essential (primary) hypertension: Secondary | ICD-10-CM

## 2017-04-28 ENCOUNTER — Encounter: Payer: Self-pay | Admitting: Gastroenterology

## 2017-05-28 ENCOUNTER — Other Ambulatory Visit: Payer: Self-pay

## 2017-05-28 ENCOUNTER — Ambulatory Visit (AMBULATORY_SURGERY_CENTER): Payer: Self-pay | Admitting: *Deleted

## 2017-05-28 VITALS — Ht 65.0 in | Wt 203.0 lb

## 2017-05-28 DIAGNOSIS — Z1211 Encounter for screening for malignant neoplasm of colon: Secondary | ICD-10-CM

## 2017-05-28 MED ORDER — NA SULFATE-K SULFATE-MG SULF 17.5-3.13-1.6 GM/177ML PO SOLN: 1.0000 | Freq: Once | ORAL | 0 refills | Status: AC

## 2017-05-28 NOTE — Progress Notes (Signed)
No egg or soy allergy known to patient  No issues with past sedation with any surgeries  or procedures, no intubation problems  No diet pills per patient No home 02 use per patient  No blood thinners per patient  Pt denies issues with constipation  No A fib or A flutter  EMMI video sent to pt's e mail  

## 2017-06-01 DIAGNOSIS — H2512 Age-related nuclear cataract, left eye: Secondary | ICD-10-CM | POA: Diagnosis not present

## 2017-06-01 DIAGNOSIS — H25011 Cortical age-related cataract, right eye: Secondary | ICD-10-CM | POA: Diagnosis not present

## 2017-06-01 DIAGNOSIS — H524 Presbyopia: Secondary | ICD-10-CM | POA: Diagnosis not present

## 2017-06-01 DIAGNOSIS — H52203 Unspecified astigmatism, bilateral: Secondary | ICD-10-CM | POA: Diagnosis not present

## 2017-06-01 DIAGNOSIS — H2511 Age-related nuclear cataract, right eye: Secondary | ICD-10-CM | POA: Diagnosis not present

## 2017-06-01 DIAGNOSIS — H5203 Hypermetropia, bilateral: Secondary | ICD-10-CM | POA: Diagnosis not present

## 2017-06-01 DIAGNOSIS — H25012 Cortical age-related cataract, left eye: Secondary | ICD-10-CM | POA: Diagnosis not present

## 2017-06-09 ENCOUNTER — Telehealth: Payer: Self-pay | Admitting: Gastroenterology

## 2017-06-09 NOTE — Telephone Encounter (Signed)
Patient canceled procedure for this Friday 2.15.19. Patient states her mother has just been admitted to hospice.Marland KitchenMarland Kitchenpt will cb to resch at a later date.

## 2017-06-09 NOTE — Telephone Encounter (Signed)
Ok no charge

## 2017-06-11 ENCOUNTER — Encounter: Payer: PRIVATE HEALTH INSURANCE | Admitting: Gastroenterology

## 2017-06-30 DIAGNOSIS — H2512 Age-related nuclear cataract, left eye: Secondary | ICD-10-CM | POA: Diagnosis not present

## 2017-06-30 DIAGNOSIS — H25011 Cortical age-related cataract, right eye: Secondary | ICD-10-CM | POA: Diagnosis not present

## 2017-06-30 DIAGNOSIS — H2511 Age-related nuclear cataract, right eye: Secondary | ICD-10-CM | POA: Diagnosis not present

## 2017-06-30 DIAGNOSIS — H25012 Cortical age-related cataract, left eye: Secondary | ICD-10-CM | POA: Diagnosis not present

## 2017-06-30 DIAGNOSIS — H5703 Miosis: Secondary | ICD-10-CM | POA: Diagnosis not present

## 2017-07-21 DIAGNOSIS — H25012 Cortical age-related cataract, left eye: Secondary | ICD-10-CM | POA: Diagnosis not present

## 2017-07-21 DIAGNOSIS — H2512 Age-related nuclear cataract, left eye: Secondary | ICD-10-CM | POA: Diagnosis not present

## 2017-07-28 ENCOUNTER — Other Ambulatory Visit: Payer: Self-pay

## 2017-07-28 DIAGNOSIS — Z1321 Encounter for screening for nutritional disorder: Secondary | ICD-10-CM

## 2017-07-28 DIAGNOSIS — I10 Essential (primary) hypertension: Secondary | ICD-10-CM

## 2017-07-28 DIAGNOSIS — Z1329 Encounter for screening for other suspected endocrine disorder: Secondary | ICD-10-CM

## 2017-07-28 DIAGNOSIS — Z0000 Encounter for general adult medical examination without abnormal findings: Secondary | ICD-10-CM

## 2017-07-28 DIAGNOSIS — R7989 Other specified abnormal findings of blood chemistry: Secondary | ICD-10-CM

## 2017-07-28 DIAGNOSIS — E7849 Other hyperlipidemia: Secondary | ICD-10-CM

## 2017-08-17 ENCOUNTER — Other Ambulatory Visit: Payer: Medicare Other | Admitting: Internal Medicine

## 2017-08-17 DIAGNOSIS — R7989 Other specified abnormal findings of blood chemistry: Secondary | ICD-10-CM | POA: Diagnosis not present

## 2017-08-17 DIAGNOSIS — E7849 Other hyperlipidemia: Secondary | ICD-10-CM

## 2017-08-17 DIAGNOSIS — Z1329 Encounter for screening for other suspected endocrine disorder: Secondary | ICD-10-CM

## 2017-08-17 DIAGNOSIS — Z0000 Encounter for general adult medical examination without abnormal findings: Secondary | ICD-10-CM | POA: Diagnosis not present

## 2017-08-17 DIAGNOSIS — I10 Essential (primary) hypertension: Secondary | ICD-10-CM | POA: Diagnosis not present

## 2017-08-17 DIAGNOSIS — Z1321 Encounter for screening for nutritional disorder: Secondary | ICD-10-CM

## 2017-08-17 LAB — COMPLETE METABOLIC PANEL WITH GFR
AG RATIO: 1.4 (calc) (ref 1.0–2.5)
ALKALINE PHOSPHATASE (APISO): 47 U/L (ref 33–130)
ALT: 10 U/L (ref 6–29)
AST: 16 U/L (ref 10–35)
Albumin: 4.3 g/dL (ref 3.6–5.1)
BILIRUBIN TOTAL: 0.8 mg/dL (ref 0.2–1.2)
BUN/Creatinine Ratio: 15 (calc) (ref 6–22)
BUN: 16 mg/dL (ref 7–25)
CHLORIDE: 106 mmol/L (ref 98–110)
CO2: 32 mmol/L (ref 20–32)
Calcium: 9.7 mg/dL (ref 8.6–10.4)
Creat: 1.06 mg/dL — ABNORMAL HIGH (ref 0.50–0.99)
GFR, Est African American: 63 mL/min/{1.73_m2} (ref >=60)
GFR, Est Non African American: 55 mL/min/{1.73_m2} — ABNORMAL LOW (ref >=60)
GLOBULIN: 3.1 g/dL (ref 1.9–3.7)
Glucose, Bld: 96 mg/dL (ref 65–99)
POTASSIUM: 4.5 mmol/L (ref 3.5–5.3)
SODIUM: 143 mmol/L (ref 135–146)
Total Protein: 7.4 g/dL (ref 6.1–8.1)

## 2017-08-17 LAB — CBC WITH DIFFERENTIAL/PLATELET
BASOS PCT: 2.1 %
Basophils Absolute: 88 cells/uL (ref 0–200)
EOS PCT: 1.9 %
Eosinophils Absolute: 80 cells/uL (ref 15–500)
HCT: 36.9 % (ref 35.0–45.0)
Hemoglobin: 12.5 g/dL (ref 11.7–15.5)
Lymphs Abs: 1457 cells/uL (ref 850–3900)
MCH: 30.6 pg (ref 27.0–33.0)
MCHC: 33.9 g/dL (ref 32.0–36.0)
MCV: 90.2 fL (ref 80.0–100.0)
MONOS PCT: 10.6 %
MPV: 11.5 fL (ref 7.5–12.5)
Neutro Abs: 2129 cells/uL (ref 1500–7800)
Neutrophils Relative %: 50.7 %
PLATELETS: 202 10*3/uL (ref 140–400)
RBC: 4.09 10*6/uL (ref 3.80–5.10)
RDW: 13 % (ref 11.0–15.0)
TOTAL LYMPHOCYTE: 34.7 %
WBC mixed population: 445 cells/uL (ref 200–950)
WBC: 4.2 10*3/uL (ref 3.8–10.8)

## 2017-08-17 LAB — LIPID PANEL
CHOL/HDL RATIO: 3.3 (calc) (ref <5.0)
CHOLESTEROL: 182 mg/dL (ref <200)
HDL: 56 mg/dL (ref >50)
LDL CHOLESTEROL (CALC): 106 mg/dL — AB
NON-HDL CHOLESTEROL (CALC): 126 mg/dL (ref <130)
Triglycerides: 108 mg/dL (ref <150)

## 2017-08-17 LAB — TSH: TSH: 1.59 mIU/L (ref 0.40–4.50)

## 2017-08-19 ENCOUNTER — Other Ambulatory Visit: Payer: Self-pay | Admitting: Internal Medicine

## 2017-08-19 DIAGNOSIS — I10 Essential (primary) hypertension: Secondary | ICD-10-CM

## 2017-08-20 ENCOUNTER — Encounter: Payer: Self-pay | Admitting: Internal Medicine

## 2017-08-20 ENCOUNTER — Ambulatory Visit (INDEPENDENT_AMBULATORY_CARE_PROVIDER_SITE_OTHER): Payer: Medicare Other | Admitting: Internal Medicine

## 2017-08-20 VITALS — BP 130/70 | HR 58 | Ht 66.0 in | Wt 200.0 lb

## 2017-08-20 DIAGNOSIS — E7849 Other hyperlipidemia: Secondary | ICD-10-CM

## 2017-08-20 DIAGNOSIS — I10 Essential (primary) hypertension: Secondary | ICD-10-CM | POA: Diagnosis not present

## 2017-08-20 DIAGNOSIS — F419 Anxiety disorder, unspecified: Secondary | ICD-10-CM | POA: Diagnosis not present

## 2017-08-20 DIAGNOSIS — Z6832 Body mass index (BMI) 32.0-32.9, adult: Secondary | ICD-10-CM | POA: Diagnosis not present

## 2017-08-20 DIAGNOSIS — Z23 Encounter for immunization: Secondary | ICD-10-CM

## 2017-08-20 DIAGNOSIS — Z0000 Encounter for general adult medical examination without abnormal findings: Secondary | ICD-10-CM

## 2017-08-20 DIAGNOSIS — L304 Erythema intertrigo: Secondary | ICD-10-CM | POA: Diagnosis not present

## 2017-08-20 DIAGNOSIS — R7989 Other specified abnormal findings of blood chemistry: Secondary | ICD-10-CM

## 2017-08-20 DIAGNOSIS — R8290 Unspecified abnormal findings in urine: Secondary | ICD-10-CM | POA: Diagnosis not present

## 2017-08-20 LAB — POCT URINALYSIS DIPSTICK
APPEARANCE: ABNORMAL
BILIRUBIN UA: NEGATIVE
Glucose, UA: NEGATIVE
Ketones, UA: NEGATIVE
Nitrite, UA: NEGATIVE
PH UA: 6 (ref 5.0–8.0)
Protein, UA: NEGATIVE
Spec Grav, UA: 1.01 (ref 1.010–1.025)
UROBILINOGEN UA: 0.2 U/dL

## 2017-08-20 MED ORDER — KETOCONAZOLE 2 % EX CREA: 1.0000 "application " | TOPICAL_CREAM | Freq: Every day | CUTANEOUS | 0 refills | Status: DC | PRN

## 2017-08-20 MED ORDER — ROSUVASTATIN CALCIUM 20 MG PO TABS: 10.0000 mg | ORAL_TABLET | Freq: Every day | ORAL | 1 refills | Status: DC

## 2017-08-20 MED ORDER — LISINOPRIL-HYDROCHLOROTHIAZIDE 20-12.5 MG PO TABS: 1.0000 | ORAL_TABLET | Freq: Every day | ORAL | 1 refills | Status: DC

## 2017-08-21 ENCOUNTER — Encounter: Payer: Self-pay | Admitting: Internal Medicine

## 2017-08-21 LAB — URINE CULTURE
MICRO NUMBER: 90512562
SPECIMEN QUALITY: ADEQUATE

## 2017-08-21 NOTE — Patient Instructions (Signed)
It was a pleasure to see you today.  Please get exercise and watch diet.  Try to lose some weight.  Pneumococcal 23 vaccine given.  Return in 1 year or as needed.

## 2017-08-21 NOTE — Progress Notes (Signed)
Subjective:    Patient ID: Joanna Ray, female    DOB: 11/12/1950, 67 y.o.   MRN: [id number]  HPI 67 year old Black Female, sister of Baird Lyons, presents to the office for health maintenance exam and evaluation of medical issues.  This is first annual Medicare wellness exam.  Last year was welcome to Medicare physical exam.  Pneumococcal 23 vaccine given today.  History of essential hypertension and hyperlipidemia which are well controlled.  No history of operations or accidents.  No known drug allergies.  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  Her husband is retired.  He has congestive heart failure.  She has not smoked for over 20 years.  No alcohol consumption.  She has 3 adult sons and 1 adult daughter.  Family history: Father died at age 30 of a stroke.  Mother died at age 27 with history of stroke in 2010.  One brother with history of hypertension and MI.  One sister age 32 with diabetes.  One sister age 49 with hypertension and another sister age 30 with hypertension.  Son with history of hypertension.      Review of Systems no new complaints     Objective:   Physical Exam  Constitutional: She is oriented to person, place, and time. She appears well-developed and well-nourished. No distress.  HENT:  Head: Atraumatic.  Right Ear: External ear normal.  Left Ear: External ear normal.  Mouth/Throat: Oropharynx is clear and moist.  Eyes: Pupils are equal, round, and reactive to light. EOM are normal. Right eye exhibits no discharge. Left eye exhibits no discharge. No scleral icterus.  Neck: No JVD present. No thyromegaly present.  Cardiovascular: Normal rate, regular rhythm, normal heart sounds and intact distal pulses.  No murmur heard. Pulmonary/Chest: Effort normal and breath sounds normal. No stridor. No respiratory distress. She has no wheezes. She has no rales. She exhibits no tenderness.  Genitourinary:  Genitourinary Comments: Pap taken in 2018 and  will not be repeated due to age.  Bimanual normal  Lymphadenopathy:    She has no cervical adenopathy.  Neurological: She is alert and oriented to person, place, and time. She displays normal reflexes. No cranial nerve deficit or sensory deficit. She exhibits normal muscle tone. Coordination normal.  Skin: Skin is warm and dry. No rash noted. She is not diaphoretic. No erythema.  Psychiatric: She has a normal mood and affect. Her behavior is normal. Judgment and thought content normal.          Assessment & Plan:  Essential hypertension stable on current regimen of Zestoretic  BMI 32-needs to lose some weight and needs more exercise  Intertrigo treated with Nizoral cream  Hyperlipidemia treated with Crestor with stable lipid panel  Slightly elevated serum creatinine 1.86 could have been due to dehydration with fasting labs- no change from last year.  Estimated GFR 63.  We will continue to monitor.  Anxiety treated sparingly with Xanax  Patient may return in 1 year or as needed  Subjective:   Patient presents for Medicare Annual/Subsequent preventive examination.  Review Past Medical/Family/Social: See above   Risk Factors  Current exercise habits: Some light exercise Dietary issues discussed: Low-fat low carbohydrate  Cardiac risk factors: Hyperlipidemia and hypertension as well as family history  Depression Screen  (Note: if answer to either of the following is "Yes", a more complete depression screening is indicated)   Over the past two weeks, have you felt down, depressed or hopeless? No  Over the  past two weeks, have you felt little interest or pleasure in doing things? No Have you lost interest or pleasure in daily life? No Do you often feel hopeless? No Do you cry easily over simple problems? No   Activities of Daily Living  In your present state of health, do you have any difficulty performing the following activities?:   Driving? No  Managing money? No    Feeding yourself? No  Getting from bed to chair? No  Climbing a flight of stairs? No  Preparing food and eating?: No  Bathing or showering? No  Getting dressed: No  Getting to the toilet? No  Using the toilet:No  Moving around from place to place: No  In the past year have you fallen or had a near fall?:No  Are you sexually active?  Yes Do you have more than one partner? No   Hearing Difficulties: No  Do you often ask people to speak up or repeat themselves? No  Do you experience ringing or noises in your ears?  Sometimes Do you have difficulty understanding soft or whispered voices? No  Do you feel that you have a problem with memory? No Do you often misplace items?  Yes   Home Safety:  Do you have a smoke alarm at your residence?  No Do you have grab bars in the bathroom?  No Do you have throw rugs in your house?  No   Cognitive Testing  Alert? Yes Normal Appearance?Yes  Oriented to person? Yes Place? Yes  Time? Yes  Recall of three objects? Yes  Can perform simple calculations? Yes  Displays appropriate judgment?Yes  Can read the correct time from a watch face?Yes   List the Names of Other Physician/Practitioners you currently use:  See referral list for the physicians patient is currently seeing.     Review of Systems: See above  Objective:     General appearance: Appears younger than stated age and mildly obese  Head: Normocephalic, without obvious abnormality, atraumatic  Eyes: conj clear, EOMi PEERLA  Ears: normal TM's and external ear canals both ears  Nose: Nares normal. Septum midline. Mucosa normal. No drainage or sinus tenderness.  Throat: lips, mucosa, and tongue normal; teeth and gums normal  Neck: no adenopathy, no carotid bruit, no JVD, supple, symmetrical, trachea midline and thyroid not enlarged, symmetric, no tenderness/mass/nodules  No CVA tenderness.  Lungs: clear to auscultation bilaterally  Breasts: normal appearance, no masses or  tenderness Heart: regular rate and rhythm, S1, S2 normal, no murmur, click, rub or gallop  Abdomen: soft, non-tender; bowel sounds normal; no masses, no organomegaly  Musculoskeletal: ROM normal in all joints, no crepitus, no deformity, Normal muscle strengthen. Back  is symmetric, no curvature. Skin: Skin color, texture, turgor normal. No rashes or lesions  Lymph nodes: Cervical, supraclavicular, and axillary nodes normal.  Neurologic: CN 2 -12 Normal, Normal symmetric reflexes. Normal coordination and gait  Psych: Alert & Oriented x 3, Mood appear stable.    Assessment:    Annual wellness medicare exam   Plan:    During the course of the visit the patient was educated and counseled about appropriate screening and preventive services including:   Annual mammogram  Annual flu vaccine     Patient Instructions (the written plan) was given to the patient.  Medicare Attestation  I have personally reviewed:  The patient's medical and social history  Their use of alcohol, tobacco or illicit drugs  Their current medications and supplements  The patient's  functional ability including ADLs,fall risks, home safety risks, cognitive, and hearing and visual impairment  Diet and physical activities  Evidence for depression or mood disorders  The patient's weight, height, BMI, and visual acuity have been recorded in the chart. I have made referrals, counseling, and provided education to the patient based on review of the above and I have provided the patient with a written personalized care plan for preventive services.

## 2017-10-13 ENCOUNTER — Other Ambulatory Visit: Payer: Self-pay

## 2017-10-13 ENCOUNTER — Telehealth: Payer: Self-pay | Admitting: Internal Medicine

## 2017-10-13 MED ORDER — ALPRAZOLAM 0.25 MG PO TABS: 0.2500 mg | ORAL_TABLET | Freq: Three times a day (TID) | ORAL | 0 refills | Status: DC | PRN

## 2017-10-13 NOTE — Telephone Encounter (Signed)
Patient called to request a refill on her Xanax however, the Rx has expired.    Pharmacy:  [facility] [address].  Thank you.

## 2017-10-13 NOTE — Telephone Encounter (Signed)
We filled it today

## 2017-10-14 NOTE — Telephone Encounter (Signed)
rx was called in

## 2017-12-28 ENCOUNTER — Encounter: Payer: Self-pay | Admitting: Internal Medicine

## 2017-12-28 ENCOUNTER — Ambulatory Visit (INDEPENDENT_AMBULATORY_CARE_PROVIDER_SITE_OTHER): Payer: Medicare Other | Admitting: Internal Medicine

## 2017-12-28 VITALS — BP 142/78 | HR 62 | Temp 98.3°F | Ht 66.0 in | Wt 206.0 lb

## 2017-12-28 DIAGNOSIS — F4321 Adjustment disorder with depressed mood: Secondary | ICD-10-CM | POA: Diagnosis not present

## 2017-12-28 DIAGNOSIS — H6690 Otitis media, unspecified, unspecified ear: Secondary | ICD-10-CM

## 2017-12-28 DIAGNOSIS — I10 Essential (primary) hypertension: Secondary | ICD-10-CM

## 2017-12-28 DIAGNOSIS — J329 Chronic sinusitis, unspecified: Secondary | ICD-10-CM | POA: Diagnosis not present

## 2017-12-28 DIAGNOSIS — H8110 Benign paroxysmal vertigo, unspecified ear: Secondary | ICD-10-CM

## 2017-12-28 MED ORDER — ALPRAZOLAM 0.5 MG PO TABS: 0.5000 mg | ORAL_TABLET | Freq: Two times a day (BID) | ORAL | 0 refills | Status: DC | PRN

## 2017-12-28 MED ORDER — ALPRAZOLAM 0.5 MG PO TABS: 0.5000 mg | ORAL_TABLET | Freq: Two times a day (BID) | ORAL | 1 refills | Status: DC | PRN

## 2017-12-28 MED ORDER — AZITHROMYCIN 250 MG PO TABS: ORAL_TABLET | ORAL | 0 refills | Status: DC

## 2017-12-28 MED ORDER — METHYLPREDNISOLONE ACETATE 80 MG/ML IJ SUSP
80.0000 mg | Freq: Once | INTRAMUSCULAR | Status: AC
  Administered 2017-12-28: 80 mg via INTRAMUSCULAR

## 2017-12-28 MED ORDER — ESCITALOPRAM OXALATE 10 MG PO TABS: 10.0000 mg | ORAL_TABLET | Freq: Every day | ORAL | 1 refills | Status: DC

## 2017-12-28 NOTE — Progress Notes (Signed)
   Subjective:    Patient ID: Joanna Ray, female    DOB: 16-Jul-1950, 67 y.o.   MRN: [id number]  HPI 67 year old Black Female with history of hypertension and benign paroxysmal vertigo in today complaining of more issues with vertigo.  In June her son was killed in a motor vehicle accident on [address].  She is not sure of all the circumstances that led to the accident and does not have any answers.  He had a brain injury.  They live 14 days in the hospital but then the family was told he was brain dead.  He was an organ donor.  He left behind a 86 year old son who attends [employer] and a daughter who is in college in [place].  Patient is started to go to counseling every 2 weeks but feels lost.  Not sleeping well.  No energy.  Does not feel like doing housework.  Works about 3 and half hours every morning.  Labor Day was a hard weekend for her because usually her son would cook out with them.    Review of Systems see above has been having some recurrent vertigo symptoms with vertigo/dizziness.  Blood pressure checked several times in the office today.  Has had some fullness in her ears.  Has had some discolored nasal drainage.     Objective:   Physical Exam TMs are full bilaterally.  Extraocular movements are full.  She has rightward nystagmus.  Pharynx is clear.  Neck is supple.  Chest clear to auscultation.  Brief neurological exam shows no other focal deficits except for the rightward nystagmus       Assessment & Plan:  Benign positional vertigo  URI  Grief reaction and situational stress  Bilateral serous otitis media  Plan: Depo-Medrol 80 mg IM.  Zithromax Z-PAK 2 p.o. day 1 followed by 1 p.o. days 2 through 5.  Xanax 0.5 mg twice daily as needed anxiety.  Lexapro 10 mg daily.  Follow-up in 4 weeks.

## 2017-12-30 ENCOUNTER — Ambulatory Visit: Payer: Medicare Other | Admitting: Internal Medicine

## 2018-01-04 DIAGNOSIS — H26492 Other secondary cataract, left eye: Secondary | ICD-10-CM | POA: Diagnosis not present

## 2018-01-04 DIAGNOSIS — Z961 Presence of intraocular lens: Secondary | ICD-10-CM | POA: Diagnosis not present

## 2018-01-09 NOTE — Patient Instructions (Signed)
Depomedrol 80 mg IM Zithromax 2 po day 1 followed by one po days 2-5. Xanax 0.5 mg twice a day as needed for anxiety. Lexapro 10 mg daily. Follow up 4 weeks.

## 2018-01-26 DIAGNOSIS — H26491 Other secondary cataract, right eye: Secondary | ICD-10-CM | POA: Diagnosis not present

## 2018-01-26 DIAGNOSIS — H26492 Other secondary cataract, left eye: Secondary | ICD-10-CM | POA: Diagnosis not present

## 2018-02-01 ENCOUNTER — Ambulatory Visit (INDEPENDENT_AMBULATORY_CARE_PROVIDER_SITE_OTHER): Payer: Medicare Other | Admitting: Internal Medicine

## 2018-02-01 ENCOUNTER — Encounter: Payer: Self-pay | Admitting: Internal Medicine

## 2018-02-01 VITALS — BP 140/80 | HR 88 | Temp 98.2°F | Ht 66.0 in | Wt 207.0 lb

## 2018-02-01 DIAGNOSIS — Z87898 Personal history of other specified conditions: Secondary | ICD-10-CM | POA: Diagnosis not present

## 2018-02-01 DIAGNOSIS — F4321 Adjustment disorder with depressed mood: Secondary | ICD-10-CM | POA: Diagnosis not present

## 2018-02-01 DIAGNOSIS — F329 Major depressive disorder, single episode, unspecified: Secondary | ICD-10-CM

## 2018-02-01 DIAGNOSIS — I10 Essential (primary) hypertension: Secondary | ICD-10-CM

## 2018-02-01 DIAGNOSIS — F4320 Adjustment disorder, unspecified: Secondary | ICD-10-CM

## 2018-02-01 DIAGNOSIS — F32A Depression, unspecified: Secondary | ICD-10-CM

## 2018-02-01 DIAGNOSIS — F419 Anxiety disorder, unspecified: Secondary | ICD-10-CM

## 2018-02-01 MED ORDER — ROSUVASTATIN CALCIUM 20 MG PO TABS: 10.0000 mg | ORAL_TABLET | Freq: Every day | ORAL | 1 refills | Status: DC

## 2018-02-01 MED ORDER — LISINOPRIL-HYDROCHLOROTHIAZIDE 20-12.5 MG PO TABS: 1.0000 | ORAL_TABLET | Freq: Every day | ORAL | 1 refills | Status: DC

## 2018-02-21 ENCOUNTER — Other Ambulatory Visit: Payer: PRIVATE HEALTH INSURANCE | Admitting: Internal Medicine

## 2018-02-23 NOTE — Patient Instructions (Signed)
I am pleased you are feeling better.  Please continue same medications and follow-up in May 2020 for physical exam or sooner if necessary.

## 2018-02-23 NOTE — Progress Notes (Signed)
   Subjective:    Patient ID: Joanna Ray, female    DOB: 31-May-1950, 67 y.o.   MRN: [id number]  HPI 67 year old Female in today to follow-up on dizziness, grief reaction and anxiety.  Still adjusting to the loss of her son in a motor vehicle accident several months ago.  History of hypertension and benign paroxysmal vertigo.  She is being supportive of 15 year old son and daughter.  Has been going to some counseling sessions.  Works mornings.  No further issues with vertigo.  Making process with grief reaction.  Is on Xanax and Lexapro.  This was started in early September.    Review of Systems     Objective:   Physical Exam  Blood pressure stable at 140/80.  Neck is supple.  No thyromegaly.  No carotid bruits.  Chest clear to auscultation.  Cardiac exam regular rate and rhythm.  No nystagmus on extraocular movement testing.  No focal deficits on brief neurological exam.  Affect is appropriate.      Assessment & Plan:  Grief reaction-doing better on Lexapro and Xanax  Anxiety and depression secondary to grief  History of vertigo  Essential hypertension  Plan: Continue current medications.  Physical exam due May 2020.  25 minutes spent with patient discussing situation and addressing medical issues

## 2018-02-25 ENCOUNTER — Ambulatory Visit: Payer: Medicare Other | Admitting: Internal Medicine

## 2018-05-25 ENCOUNTER — Telehealth: Payer: Self-pay | Admitting: Internal Medicine

## 2018-05-25 NOTE — Telephone Encounter (Signed)
Patient called this morning; ? Flu.  She is severely cough/congestion x 1 week.  No current fever.  Can she be added anywhere tomorrow or Friday after lunch?

## 2018-05-25 NOTE — Telephone Encounter (Signed)
Spoke with patient and provided appointment for 1/30 @ 9:40 a.m.

## 2018-05-25 NOTE — Telephone Encounter (Signed)
First appt in am 9:40

## 2018-05-26 ENCOUNTER — Ambulatory Visit (INDEPENDENT_AMBULATORY_CARE_PROVIDER_SITE_OTHER): Payer: Medicare Other | Admitting: Internal Medicine

## 2018-05-26 ENCOUNTER — Encounter: Payer: Self-pay | Admitting: Internal Medicine

## 2018-05-26 ENCOUNTER — Other Ambulatory Visit: Payer: Self-pay | Admitting: Internal Medicine

## 2018-05-26 VITALS — BP 130/80 | HR 70 | Temp 98.6°F | Ht 69.0 in | Wt 211.0 lb

## 2018-05-26 DIAGNOSIS — J22 Unspecified acute lower respiratory infection: Secondary | ICD-10-CM | POA: Diagnosis not present

## 2018-05-26 MED ORDER — CLARITHROMYCIN 500 MG PO TABS: 500.0000 mg | ORAL_TABLET | Freq: Two times a day (BID) | ORAL | 0 refills | Status: DC

## 2018-05-26 MED ORDER — HYDROCODONE-HOMATROPINE 5-1.5 MG/5ML PO SYRP: 5.0000 mL | ORAL_SOLUTION | Freq: Three times a day (TID) | ORAL | 0 refills | Status: DC | PRN

## 2018-05-26 NOTE — Addendum Note (Signed)
Addended by: Mady Haagensen on: 05/26/2018 12:10 PM   Modules accepted: Orders

## 2018-05-26 NOTE — Patient Instructions (Signed)
Biaxin 500 mg twice daily for 10 days.  Hycodan 1 teaspoon p.o. every 8 hours as needed cough.  Rest and drink plenty of fluids.

## 2018-05-26 NOTE — Telephone Encounter (Signed)
Patient called and said the cough medicine [facility] is not able to be filled today because they dont have enough to fill it, she was able to get the other there. Patient would like [place] to send new scrtipt to [facility] [address], [place], [place] [address] phone number: [phone]

## 2018-05-26 NOTE — Progress Notes (Signed)
   Subjective:    Patient ID: Joanna Ray, female    DOB: 10-May-1950, 68 y.o.   MRN: [id number]  HPI 68 year old Female developed cough with green sputum production onset last Thursday.  No documented fever or shaking chills.  Some malaise and fatigue.  Her mother died at age 7 recently at end of December.  Has not improved over several days.  Review of Systems see above     Objective:   Physical Exam Skin warm and dry.  Vital signs reviewed.  She is afebrile.  TMs are clear.  Pharynx very slightly injected.  Neck supple.  Chest clear to auscultation.  She has been hoarse and sounds hoarse today.       Assessment & Plan:  Acute lower respiratory infection  Plan: Biaxin 500 mg twice daily for 10 days.  Hycodan 1 teaspoon p.o. every 8 hours as needed cough.  Rest and drink plenty of fluids.

## 2018-05-26 NOTE — Telephone Encounter (Signed)
Please call and check on this at pharmacy. When will they have enough to fill it? This is highly irregular and we generally do not change pharmacies for controlled substances. She may have to wait until tomorrow.

## 2018-05-27 ENCOUNTER — Ambulatory Visit: Payer: Medicare Other | Admitting: Internal Medicine

## 2018-07-19 ENCOUNTER — Telehealth: Payer: Self-pay | Admitting: Internal Medicine

## 2018-07-19 ENCOUNTER — Encounter: Payer: Self-pay | Admitting: Internal Medicine

## 2018-07-19 NOTE — Telephone Encounter (Signed)
Patient's husband was working with his brother and found out brother may have had Coronovirus exposure from someone else. This has been at least 14 days ago and no one is sick. I believe there is no need to quarantine anyone at this point.

## 2018-08-26 ENCOUNTER — Other Ambulatory Visit: Payer: Self-pay

## 2018-08-26 ENCOUNTER — Other Ambulatory Visit: Payer: Medicare Other | Admitting: Internal Medicine

## 2018-08-26 VITALS — Temp 98.3°F

## 2018-08-26 DIAGNOSIS — F329 Major depressive disorder, single episode, unspecified: Secondary | ICD-10-CM

## 2018-08-26 DIAGNOSIS — Z0000 Encounter for general adult medical examination without abnormal findings: Secondary | ICD-10-CM | POA: Diagnosis not present

## 2018-08-26 DIAGNOSIS — F419 Anxiety disorder, unspecified: Secondary | ICD-10-CM

## 2018-08-26 DIAGNOSIS — I10 Essential (primary) hypertension: Secondary | ICD-10-CM | POA: Diagnosis not present

## 2018-08-26 DIAGNOSIS — E7849 Other hyperlipidemia: Secondary | ICD-10-CM

## 2018-08-26 DIAGNOSIS — F32A Depression, unspecified: Secondary | ICD-10-CM

## 2018-08-27 LAB — CBC WITH DIFFERENTIAL/PLATELET
Absolute Monocytes: 632 cells/uL (ref 200–950)
Basophils Absolute: 69 cells/uL (ref 0–200)
Basophils Relative: 1.6 %
Eosinophils Absolute: 168 cells/uL (ref 15–500)
Eosinophils Relative: 3.9 %
HCT: 35.4 % (ref 35.0–45.0)
Hemoglobin: 11.8 g/dL (ref 11.7–15.5)
Lymphs Abs: 1359 cells/uL (ref 850–3900)
MCH: 30.3 pg (ref 27.0–33.0)
MCHC: 33.3 g/dL (ref 32.0–36.0)
MCV: 91 fL (ref 80.0–100.0)
MPV: 11.4 fL (ref 7.5–12.5)
Monocytes Relative: 14.7 %
Neutro Abs: 2073 cells/uL (ref 1500–7800)
Neutrophils Relative %: 48.2 %
Platelets: 275 10*3/uL (ref 140–400)
RBC: 3.89 10*6/uL (ref 3.80–5.10)
RDW: 13.1 % (ref 11.0–15.0)
Total Lymphocyte: 31.6 %
WBC: 4.3 10*3/uL (ref 3.8–10.8)

## 2018-08-27 LAB — COMPLETE METABOLIC PANEL WITH GFR
AG Ratio: 1.3 (calc) (ref 1.0–2.5)
ALT: 12 U/L (ref 6–29)
AST: 18 U/L (ref 10–35)
Albumin: 4 g/dL (ref 3.6–5.1)
Alkaline phosphatase (APISO): 50 U/L (ref 37–153)
BUN: 17 mg/dL (ref 7–25)
CO2: 30 mmol/L (ref 20–32)
Calcium: 9.3 mg/dL (ref 8.6–10.4)
Chloride: 105 mmol/L (ref 98–110)
Creat: 0.98 mg/dL (ref 0.50–0.99)
GFR, Est African American: 69 mL/min/{1.73_m2} (ref >=60)
GFR, Est Non African American: 60 mL/min/{1.73_m2} (ref >=60)
Globulin: 3.1 g/dL (calc) (ref 1.9–3.7)
Glucose, Bld: 93 mg/dL (ref 65–99)
Potassium: 4.4 mmol/L (ref 3.5–5.3)
Sodium: 142 mmol/L (ref 135–146)
Total Bilirubin: 0.6 mg/dL (ref 0.2–1.2)
Total Protein: 7.1 g/dL (ref 6.1–8.1)

## 2018-08-27 LAB — LIPID PANEL
Cholesterol: 172 mg/dL (ref <200)
HDL: 44 mg/dL — ABNORMAL LOW (ref >=50)
LDL Cholesterol (Calc): 108 mg/dL (calc) — ABNORMAL HIGH
Non-HDL Cholesterol (Calc): 128 mg/dL (calc) (ref <130)
Total CHOL/HDL Ratio: 3.9 (calc) (ref <5.0)
Triglycerides: 103 mg/dL (ref <150)

## 2018-08-27 LAB — TSH: TSH: 1.99 mIU/L (ref 0.40–4.50)

## 2018-09-02 ENCOUNTER — Ambulatory Visit (INDEPENDENT_AMBULATORY_CARE_PROVIDER_SITE_OTHER): Payer: Medicare Other | Admitting: Internal Medicine

## 2018-09-02 ENCOUNTER — Encounter: Payer: Self-pay | Admitting: Internal Medicine

## 2018-09-02 ENCOUNTER — Other Ambulatory Visit: Payer: Self-pay

## 2018-09-02 VITALS — BP 140/90 | HR 60 | Wt 200.0 lb

## 2018-09-02 DIAGNOSIS — F439 Reaction to severe stress, unspecified: Secondary | ICD-10-CM

## 2018-09-02 DIAGNOSIS — Z87898 Personal history of other specified conditions: Secondary | ICD-10-CM | POA: Diagnosis not present

## 2018-09-02 DIAGNOSIS — Z0000 Encounter for general adult medical examination without abnormal findings: Secondary | ICD-10-CM

## 2018-09-02 DIAGNOSIS — I10 Essential (primary) hypertension: Secondary | ICD-10-CM | POA: Diagnosis not present

## 2018-09-02 DIAGNOSIS — F4321 Adjustment disorder with depressed mood: Secondary | ICD-10-CM

## 2018-09-02 DIAGNOSIS — E7800 Pure hypercholesterolemia, unspecified: Secondary | ICD-10-CM

## 2018-09-02 DIAGNOSIS — F419 Anxiety disorder, unspecified: Secondary | ICD-10-CM | POA: Diagnosis not present

## 2018-09-02 DIAGNOSIS — H8110 Benign paroxysmal vertigo, unspecified ear: Secondary | ICD-10-CM | POA: Diagnosis not present

## 2018-09-02 DIAGNOSIS — F329 Major depressive disorder, single episode, unspecified: Secondary | ICD-10-CM | POA: Diagnosis not present

## 2018-09-02 DIAGNOSIS — E786 Lipoprotein deficiency: Secondary | ICD-10-CM

## 2018-09-02 LAB — POCT URINALYSIS DIPSTICK
Appearance: NEGATIVE
Bilirubin, UA: NEGATIVE
Blood, UA: NEGATIVE
Glucose, UA: NEGATIVE
Ketones, UA: NEGATIVE
Leukocytes, UA: NEGATIVE
Nitrite, UA: NEGATIVE
Odor: NEGATIVE
Protein, UA: NEGATIVE
Spec Grav, UA: 1.01 (ref 1.010–1.025)
Urobilinogen, UA: 0.2 E.U./dL
pH, UA: 6.5 (ref 5.0–8.0)

## 2018-09-02 MED ORDER — LISINOPRIL-HYDROCHLOROTHIAZIDE 20-12.5 MG PO TABS: 1.0000 | ORAL_TABLET | Freq: Every day | ORAL | 1 refills | Status: DC

## 2018-09-02 MED ORDER — ROSUVASTATIN CALCIUM 20 MG PO TABS: 10.0000 mg | ORAL_TABLET | Freq: Every day | ORAL | 1 refills | Status: DC

## 2018-09-02 MED ORDER — ESCITALOPRAM OXALATE 10 MG PO TABS: 10.0000 mg | ORAL_TABLET | Freq: Every day | ORAL | 5 refills | Status: DC

## 2018-09-02 NOTE — Progress Notes (Signed)
Subjective:    Patient ID: Joanna Ray, female    DOB: 1951/04/15, 68 y.o.   MRN: [id number]  HPI 68 year old female seen today for Medicare wellness, health maintenance exam and evaluation of medical issues.  She had [facility] exam in 2018.  She has no history of operations or accidents.  History of essential hypertension and hyperlipidemia which are well controlled.  Has had Prevnar 13 and pneumococcal vaccines.  No known drug allergies.  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  She has not smoked for over 20 years.  No alcohol consumption.  Has adult children.  Family history: Father died at age 36 of a stroke.  Mother died at age 29 with history of stroke in 2010.  One brother with history of hypertension and MI.  One sister age 46 with diabetes.  One sister age 73 with hypertension and another sister age 90 with hypertension.  Son with history of hypertension.    Review of Systems  Constitutional: Negative.   All other systems reviewed and are negative.      Objective:   Physical Exam Vitals signs reviewed.  Constitutional:      General: She is not in acute distress.    Appearance: Normal appearance. She is not diaphoretic.  HENT:     Head: Normocephalic and atraumatic.     Right Ear: Tympanic membrane normal.     Left Ear: Tympanic membrane normal.     Mouth/Throat:     Mouth: Mucous membranes are moist.     Pharynx: Oropharynx is clear.  Eyes:     General: No scleral icterus.    Conjunctiva/sclera: Conjunctivae normal.     Pupils: Pupils are equal, round, and reactive to light.  Neck:     Musculoskeletal: Neck supple. No neck rigidity.     Comments: No thyromegaly Cardiovascular:     Rate and Rhythm: Normal rate and regular rhythm.     Pulses: Normal pulses.     Heart sounds: Normal heart sounds. No murmur.     Comments: Breast without masses Pulmonary:     Effort: No respiratory  distress.     Breath sounds: No wheezing.  Abdominal:     Palpations: Abdomen is soft. There is no mass.     Tenderness: There is no abdominal tenderness. There is no rebound.  Genitourinary:    Comments: Pap taken in 2018 and will not be repeated due to age.  Bimanual normal Musculoskeletal:     Right lower leg: No edema.     Left lower leg: No edema.  Lymphadenopathy:     Cervical: No cervical adenopathy.  Skin:    General: Skin is warm and dry.  Neurological:     General: No focal deficit present.     Mental Status: She is alert. She is disoriented.     Cranial Nerves: No cranial nerve deficit.     Motor: No weakness.  Psychiatric:        Mood and Affect: Mood normal.        Behavior: Behavior normal.        Thought Content: Thought content normal.        Judgment: Judgment normal.           Assessment & Plan:  Lab work reviewed and is entirely within normal limits except for a mild elevation of LDL cholesterol at 108.  She has a low HDL at 44.  Other labs are normal.  These included CBC, TSH, C met.  Reminded patient about annual mammogram and annual flu vaccine.  She has anxiety that is treated sparingly with Xanax.  Also takes Lexapro  Hyperlipidemia treated with Crestor 20 mg daily and lipids are stable.  Intertrigo treated with Nizoral cream  Hypertension stable on current regimen- Zestoretic  Continues grief-lost her son in fundamental accident Summer 2019 now going to counseling  History of benign positional vertigo  Plan: Continue same medications and follow-up in 6 months.  Subjective:   Patient presents for Medicare Annual/Subsequent preventive examination.  Review Past Medical/Family/Social: See above   Risk Factors  Current exercise habits: Some light exercise Dietary issues discussed: Low-fat low carbohydrate discussed  Cardiac risk factors: Hyperlipidemia and hypertension as well as family history  Depression Screen  (Note: if answer to  either of the following is "Yes", a more complete depression screening is indicated)   Over the past two weeks, have you felt down, depressed or hopeless? No  Over the past two weeks, have you felt little interest or pleasure in doing things? No Have you lost interest or pleasure in daily life? No Do you often feel hopeless? No Do you cry easily over simple problems? No   Activities of Daily Living  In your present state of health, do you have any difficulty performing the following activities?:   Driving? No  Managing money? No  Feeding yourself? No  Getting from bed to chair? No  Climbing a flight of stairs? No  Preparing food and eating?: No  Bathing or showering? No  Getting dressed: No  Getting to the toilet? No  Using the toilet:No  Moving around from place to place: No  In the past year have you fallen or had a near fall?:No  Are you sexually active?  Yes Do you have more than one partner? No   Hearing Difficulties: No  Do you often ask people to speak up or repeat themselves?  Yes Do you experience ringing or noises in your ears? No  Do you have difficulty understanding soft or whispered voices? No  Do you feel that you have a problem with memory? No Do you often misplace items? No    Home Safety:  Do you have a smoke alarm at your residence? Yes Do you have grab bars in the bathroom?  No Do you have throw rugs in your house?  None   Cognitive Testing  Alert? Yes Normal Appearance?Yes  Oriented to person? Yes Place? Yes  Time? Yes  Recall of three objects? Yes  Can perform simple calculations? Yes  Displays appropriate judgment?Yes  Can read the correct time from a watch face?Yes   List the Names of Other Physician/Practitioners you currently use:  See referral list for the physicians patient is currently seeing.     Review of Systems: See above   Objective:     General appearance: Appears stated age and mildly obese  Head: Normocephalic, without  obvious abnormality, atraumatic  Eyes: conj clear, EOMi PEERLA  Ears: normal TM's and external ear canals both ears  Nose: Nares normal. Septum midline. Mucosa normal. No drainage or sinus tenderness.  Throat: lips, mucosa, and tongue normal; teeth and gums normal  Neck: no adenopathy, no carotid bruit, no JVD, supple, symmetrical, trachea midline and thyroid not enlarged, symmetric, no tenderness/mass/nodules  No CVA tenderness.  Lungs: clear to auscultation bilaterally  Breasts: normal appearance, no masses or tenderness Heart: regular rate and rhythm,  S1, S2 normal, no murmur, click, rub or gallop  Abdomen: soft, non-tender; bowel sounds normal; no masses, no organomegaly  Musculoskeletal: ROM normal in all joints, no crepitus, no deformity, Normal muscle strengthen. Back  is symmetric, no curvature. Skin: Skin color, texture, turgor normal. No rashes or lesions  Lymph nodes: Cervical, supraclavicular, and axillary nodes normal.  Neurologic: CN 2 -12 Normal, Normal symmetric reflexes. Normal coordination and gait  Psych: Alert & Oriented x 3, Mood appear stable.    Assessment:    Annual wellness medicare exam   Plan:    During the course of the visit the patient was educated and counseled about appropriate screening and preventive services including:   Annual flu vaccine  Annual mammogram     Patient Instructions (the written plan) was given to the patient.  Medicare Attestation  I have personally reviewed:  The patient's medical and social history  Their use of alcohol, tobacco or illicit drugs  Their current medications and supplements  The patient's functional ability including ADLs,fall risks, home safety risks, cognitive, and hearing and visual impairment  Diet and physical activities  Evidence for depression or mood disorders  The patient's weight, height, BMI, and visual acuity have been recorded in the chart. I have made referrals, counseling, and provided  education to the patient based on review of the above and I have provided the patient with a written personalized care plan for preventive services.

## 2018-09-20 ENCOUNTER — Other Ambulatory Visit: Payer: Self-pay | Admitting: Internal Medicine

## 2018-09-20 DIAGNOSIS — F4321 Adjustment disorder with depressed mood: Secondary | ICD-10-CM

## 2018-09-20 DIAGNOSIS — H8110 Benign paroxysmal vertigo, unspecified ear: Secondary | ICD-10-CM

## 2018-09-24 MED ORDER — ALPRAZOLAM 0.5 MG PO TABS: 0.5000 mg | ORAL_TABLET | Freq: Two times a day (BID) | ORAL | 1 refills | Status: DC | PRN

## 2018-11-21 ENCOUNTER — Encounter: Payer: Self-pay | Admitting: Internal Medicine

## 2018-11-21 ENCOUNTER — Ambulatory Visit (INDEPENDENT_AMBULATORY_CARE_PROVIDER_SITE_OTHER): Payer: Medicare Other | Admitting: Internal Medicine

## 2018-11-21 ENCOUNTER — Other Ambulatory Visit: Payer: Self-pay

## 2018-11-21 VITALS — BP 120/80 | HR 84 | Temp 98.6°F | Ht 69.0 in | Wt 207.0 lb

## 2018-11-21 DIAGNOSIS — L02435 Carbuncle of right lower limb: Secondary | ICD-10-CM | POA: Diagnosis not present

## 2018-11-21 MED ORDER — DOXYCYCLINE HYCLATE 100 MG PO TABS: 100.0000 mg | ORAL_TABLET | Freq: Two times a day (BID) | ORAL | 0 refills | Status: DC

## 2018-11-21 MED ORDER — MUPIROCIN 2 % EX OINT: TOPICAL_OINTMENT | CUTANEOUS | 0 refills | Status: DC

## 2018-11-21 NOTE — Progress Notes (Signed)
   Subjective:    Patient ID: Joanna Ray, female    DOB: 1950-09-22, 68 y.o.   MRN: [id number]  HPI Pt says she has had a lesion on her right lower leg for some time.  Recently there appeared to be have on that area which she picked at.  Now the lesion has become infected with drainage.  This is concerning to her.  She has a similar lesion on her right thigh but it has no drainage.    Review of Systems see above     Objective:   Physical Exam Blood pressure 120/80.  She is afebrile.  Pulse 84.  Approximate 4 mm in diameter lesion right lower leg that appears to be secondarily infected with thick purulent material on top of the keratotic lesion as well as drainage from the lesion.  It was cleaned with peroxide and dressed with Bactroban ointment.       Assessment & Plan:  Infected keratotic lesion  Plan: Doxycycline 100 mg twice daily for 7 days.  Clean area with peroxide twice daily and apply Bactroban ointment.  Patient given phone number of local dermatologist to call for follow-up.

## 2018-11-21 NOTE — Patient Instructions (Addendum)
Take  Doxycycline100 mg twice daily for 7 days.  See Dermatologist for possible biopsy.  Clean with peroxide twice daily and apply Bactroban ointment twice daily.  Keep lesion covered until healed.

## 2018-11-23 DIAGNOSIS — L820 Inflamed seborrheic keratosis: Secondary | ICD-10-CM | POA: Diagnosis not present

## 2018-11-24 ENCOUNTER — Other Ambulatory Visit: Payer: Self-pay

## 2018-11-30 DIAGNOSIS — Z961 Presence of intraocular lens: Secondary | ICD-10-CM | POA: Diagnosis not present

## 2018-12-09 ENCOUNTER — Other Ambulatory Visit: Payer: Self-pay

## 2018-12-09 DIAGNOSIS — F439 Reaction to severe stress, unspecified: Secondary | ICD-10-CM

## 2018-12-09 MED ORDER — EPINEPHRINE 0.3 MG/0.3ML IJ SOAJ: 0.3000 mg | INTRAMUSCULAR | 5 refills | Status: DC | PRN

## 2018-12-09 NOTE — Telephone Encounter (Signed)
Patient called states she had an allergic reaction to shrimp her tongue started to swell on Sunday and [facility] was [facility] benadryl and an epi pen. She wants to know if she can have a prescription for an epi pen.  She uses Paediatric nurse on [address].

## 2018-12-15 DIAGNOSIS — L503 Dermatographic urticaria: Secondary | ICD-10-CM | POA: Diagnosis not present

## 2018-12-24 ENCOUNTER — Telehealth (INDEPENDENT_AMBULATORY_CARE_PROVIDER_SITE_OTHER): Payer: Medicare Other | Admitting: Internal Medicine

## 2018-12-24 ENCOUNTER — Encounter: Payer: Self-pay | Admitting: Internal Medicine

## 2018-12-24 DIAGNOSIS — R197 Diarrhea, unspecified: Secondary | ICD-10-CM

## 2018-12-24 NOTE — Telephone Encounter (Signed)
Pt ate pizza for dinner last night and had gastric distress with diarrhea early am. Ate brownies later on in the day. Has noticed drops of bright red blood when wiping but not in toilet bowl. May have rectal irritation. Pt reassured. Will likely stop but if continues call back. We can see in office on Monday

## 2018-12-26 ENCOUNTER — Encounter: Payer: Self-pay | Admitting: Internal Medicine

## 2018-12-26 ENCOUNTER — Telehealth: Payer: Self-pay | Admitting: Internal Medicine

## 2018-12-26 NOTE — Telephone Encounter (Signed)
Patient called around 9:30 PM saying she had swollen lips.  Had not eaten anything this evening that would have caused  swollen lips like seafood.  Patient indicates that she has had issues off and on with urticaria since eating some shrimp   She was seen by dermatologist.  Was placed on cimetidine.  She is in no acute respiratory distress.  She does have Benadryl in the house and I advised her to take 50 mg i.e. 2x 25 mg capsules tonight.  We will have her evaluated by allergist.

## 2018-12-28 ENCOUNTER — Other Ambulatory Visit: Payer: Self-pay

## 2018-12-28 ENCOUNTER — Encounter: Payer: Self-pay | Admitting: Allergy

## 2018-12-28 ENCOUNTER — Ambulatory Visit (INDEPENDENT_AMBULATORY_CARE_PROVIDER_SITE_OTHER): Payer: Medicare Other | Admitting: Allergy

## 2018-12-28 VITALS — BP 132/70 | HR 65 | Temp 98.0°F | Resp 16 | Ht 66.0 in | Wt 205.2 lb

## 2018-12-28 DIAGNOSIS — T783XXD Angioneurotic edema, subsequent encounter: Secondary | ICD-10-CM

## 2018-12-28 DIAGNOSIS — T7800XD Anaphylactic reaction due to unspecified food, subsequent encounter: Secondary | ICD-10-CM

## 2018-12-28 DIAGNOSIS — L509 Urticaria, unspecified: Secondary | ICD-10-CM | POA: Diagnosis not present

## 2018-12-28 NOTE — Patient Instructions (Addendum)
Swelling and Hives Anaphylaxis due to food  - at this time etiology of hives and swelling is unknown.  However tongue swelling after shrimp ingestion is concern for possible shrimp/shellfish allergy.  Hives can be caused by a variety of different triggers including illness/infection, foods, medications, stings, exercise, pressure, vibrations, extremes of temperature to name a few however majority of the time there is no identifiable trigger.  Will obtain labwork to evaluate: CBC w diff, CMP, tryptase, hive panel, environmental panel, alpha-gal panel, shellfish panel and tomato  - for management of hives/swelling recommend the following high-dose antihistamine regimen:  Allegra 180mg  1 tablet twice a day.    Continue Cimetidine 400mg  1 tablet twice a day with Allegra (take until gone).  Once you run out of Cimetidine then replace with Pepcid 20mg  1 tablet twice a day.   Will also start Singulair 10mg  daily at bedtime (if you feel a change in behavior or mood while on Singulair then stop this medication and let us know)  - we briefly discussed Xolair monthly injections for hive/swelling control if high-dose antihistamines above are not effective enough in controlling hives.    - would avoid all shellfish and tomato products until labs return  - you have epipen prescribed already.  Follow emergency action plan in case of future reaction  Follow-up 2-3 months or sooner if needed

## 2018-12-28 NOTE — Progress Notes (Signed)
New Patient Note  RE: Joanna Ray MRN: [id number] DOB: 05-30-1950 Date of Office Visit: 12/28/2018  Referring provider: [name], MD Primary care provider: [name], MD  Chief Complaint: swelling and hives  History of present illness: Joanna Ray is a 68 y.o. female presenting today for consultation for swelling and hives.   She has been having hives that are very itchy for the past 2-3 weeks.  Also having swelling episodes.   She states she ate some shrimp on the first Sunday in August and shortly after ingestion noted tongue swelling.  She states she had difficulty talking.  She went to a pharmacy and states the pharmcist administered 2 injections of epipen.  Symptoms improved and she states she returned home after.  She does have Epipen at home now.  Since then she has been having hives and further swelling episodes.  She has been avoiding shrimp since the reaction.  She states the shrimp was prepared a different way than she normally eats.  She states typically she eats fried shrimp about once a month without issues previously.  She states she can eat fish without issue. Hives migrate around the body.   She states she has continued to have lip and jaw swelling and has had foot swelling.  She denies any new medications.  No preceding illnesses.  No change in detergents/lotions/soaps.  She states she has been gardening more since she has not been working due to [employer].  She states she may have been stung/bit by something while gardening about 3 weeks ago but states she did not pull any ticks or insects off of her.     She has been seen by dermatology as she had a mole removal recently and also told them about the hives.  She was recommended to use cimetidine and was sent a prescription to take 400mg  3 times a day.  She does not feel the cimetidine has been helpful. She also has hydroxyzine 25mg  tablet which helps her sleep better at night.    She states she knew  she couldn't eat lobster as it made her feel sick with vomiting.  She felt like the lobster wasn't quite done.  She states this was about 10 years ago.    She states she ate tomatoes from garden and developed a rash that she described as "water bumps".  She states she ate pizza this past Friday and she had abdominal pain and diarrhea.    No history of asthma, eczema or seasonal/perennial allergic rhinitis/conjunctivitis.   Review of systems: Review of Systems  Constitutional: Negative for chills, fever and malaise/fatigue.  HENT: Negative for congestion, ear discharge, nosebleeds and sore throat.   Eyes: Negative for pain, discharge and redness.  Respiratory: Negative for cough, shortness of breath and wheezing.   Cardiovascular: Negative for chest pain.  Gastrointestinal: Positive for abdominal pain and diarrhea. Negative for heartburn, nausea and vomiting.  Musculoskeletal: Negative for joint pain.  Skin: Positive for itching and rash.  Neurological: Negative for headaches.    All other systems negative unless noted above in HPI  Past medical history: Past Medical History:  Diagnosis Date  . Anxiety   . GERD (gastroesophageal reflux disease)   . Hyperlipidemia   . Hypertension     Past surgical history: Past Surgical History:  Procedure Laterality Date  . DILATION AND CURETTAGE OF UTERUS      Family history:  Family History  Problem Relation Age of Onset  .  Stroke Mother   . Stroke Father   . Diabetes Sister   . Colon cancer Neg Hx   . Colon polyps Neg Hx   . Esophageal cancer Neg Hx   . Rectal cancer Neg Hx   . Stomach cancer Neg Hx     Social history: Lives in a home with carpeting with electric and wood heating.  No pets in the home. No concern for water damage, mildew or roaches in the home.  She is a Pharmacist, hospital.  Denies smoking history.    Medication List: Allergies as of 12/28/2018      Reactions   Shellfish Allergy Swelling   Swelling of lips       Medication List       Accurate as of December 28, 2018  4:59 PM. If you have any questions, ask your nurse or doctor.        ALPRAZolam 0.5 MG tablet Commonly known as: XANAX Take 1 tablet by mouth twice daily as needed for anxiety   ALPRAZolam 0.5 MG tablet Commonly known as: Xanax Take 1 tablet (0.5 mg total) by mouth 2 (two) times daily as needed for anxiety.   aspirin 81 MG tablet Take 81 mg by mouth daily.   cholecalciferol 1000 units tablet Commonly known as: VITAMIN D Take 1,000 Units by mouth daily.   cimetidine 400 MG tablet Commonly known as: TAGAMET Take 400 mg by mouth 3 (three) times daily.   COD LIVER OIL PO Take 1 capsule by mouth daily.   doxycycline 100 MG tablet Commonly known as: VIBRA-TABS Take 1 tablet (100 mg total) by mouth 2 (two) times daily.   EPINEPHrine 0.3 mg/0.3 mL Soaj injection Commonly known as: EPI-PEN Inject 0.3 mLs (0.3 mg total) into the muscle as needed for anaphylaxis.   escitalopram 10 MG tablet Commonly known as: Lexapro Take 1 tablet (10 mg total) by mouth daily.   hydrOXYzine 25 MG capsule Commonly known as: VISTARIL TAKE 1 TO 2 CAPSULES BY MOUTH EVERY DAY AT BEDTIME AS NEEDED FOR ITCHING (MAY CAUSE DROWSINESS)   ketoconazole 2 % cream Commonly known as: NIZORAL Apply 1 application topically daily as needed.   lisinopril-hydrochlorothiazide 20-12.5 MG tablet Commonly known as: ZESTORETIC Take 1 tablet by mouth daily.   mupirocin ointment 2 % Commonly known as: Bactroban Apply to infected leg lesion twice a day   rosuvastatin 20 MG tablet Commonly known as: CRESTOR Take 0.5 tablets (10 mg total) by mouth daily. Take 1/2 tablet   vitamin B-12 100 MCG tablet Commonly known as: CYANOCOBALAMIN Take 50 mcg by mouth daily.   vitamin E 100 UNIT capsule Take 100 Units by mouth daily.       Known medication allergies: Allergies  Allergen Reactions  . Shellfish Allergy Swelling    Swelling of lips      Physical examination: Blood pressure 132/70, pulse 65, temperature 98 F (36.7 C), resp. rate 16, height 5\' 6"  (1.676 m), weight 205 lb 3.2 oz (93.1 kg), SpO2 98 %.  General: Alert, interactive, in no acute distress. HEENT: PERRLA, TMs pearly gray, turbinates non-edematous without discharge, post-pharynx non erythematous. Neck: Supple without lymphadenopathy. Lungs: Clear to auscultation without wheezing, rhonchi or rales. {no increased work of breathing. CV: Normal S1, S2 without murmurs. Abdomen: Nondistended, nontender. Skin: Scattered erythematous urticarial type lesions primarily located left upper and forearm , nonvesicular. Extremities:  No clubbing, cyanosis or edema. Neuro:   Grossly intact.  Diagnositics/Labs:  Allergy testing: deferred due to ongoing urticaria  Assessment and plan: Urticaria and angioedema  - at this time etiology of hives and swelling is unknown.  However tongue swelling after shrimp ingestion is concern for possible shrimp/shellfish allergy.  Hives can be caused by a variety of different triggers including illness/infection, foods, medications, stings, exercise, pressure, vibrations, extremes of temperature to name a few however majority of the time there is no identifiable trigger.  Will obtain labwork to evaluate: CBC w diff, CMP, tryptase, hive panel, environmental panel, alpha-gal panel, shellfish panel and tomato  - for management of hives/swelling recommend the following high-dose antihistamine regimen:  Allegra 180mg  1 tablet twice a day.    Continue Cimetidine 400mg  1 tablet twice a day with Allegra (take until gone).  Once you run out of Cimetidine then replace with Pepcid 20mg  1 tablet twice a day.   Will also start Singulair 10mg  daily at bedtime (if you feel a change in behavior or mood while on Singulair then stop this medication and let us know)  - we briefly discussed Xolair monthly injections for hive/swelling control if high-dose antihistamines  above are not effective enough in controlling hives.    - would avoid all shellfish and tomato products until labs return  - you have epipen prescribed already.  Follow emergency action plan in case of future reaction  Follow-up 2-3 months or sooner if needed  I appreciate the opportunity to take part in Joanna Ray's care. Please do not hesitate to contact me with questions.  Sincerely,   Prudy Feeler, MD Allergy/Immunology [facility]

## 2019-01-05 ENCOUNTER — Telehealth: Payer: Self-pay | Admitting: Allergy

## 2019-01-05 NOTE — Telephone Encounter (Signed)
They have not all resulted out yet.   Would she like to know what is back or can she wait until everything returns for the results?

## 2019-01-05 NOTE — Telephone Encounter (Signed)
Dr Padgett please advise 

## 2019-01-05 NOTE — Telephone Encounter (Signed)
Pt called aabout her lab results [phone].

## 2019-01-05 NOTE — Telephone Encounter (Signed)
Informed patient we are waiting on 1 blood test. She would like to know the results of what has come back.

## 2019-01-06 LAB — COMPREHENSIVE METABOLIC PANEL
ALT: 12 IU/L (ref 0–32)
AST: 16 IU/L (ref 0–40)
Albumin/Globulin Ratio: 1.5 (ref 1.2–2.2)
Albumin: 4.6 g/dL (ref 3.8–4.8)
Alkaline Phosphatase: 54 IU/L (ref 39–117)
BUN/Creatinine Ratio: 12 (ref 12–28)
BUN: 15 mg/dL (ref 8–27)
Bilirubin Total: 0.8 mg/dL (ref 0.0–1.2)
CO2: 25 mmol/L (ref 20–29)
Calcium: 9.7 mg/dL (ref 8.7–10.3)
Chloride: 104 mmol/L (ref 96–106)
Creatinine, Ser: 1.23 mg/dL — ABNORMAL HIGH (ref 0.57–1.00)
GFR calc Af Amer: 52 mL/min/{1.73_m2} — ABNORMAL LOW (ref >59)
GFR calc non Af Amer: 45 mL/min/{1.73_m2} — ABNORMAL LOW (ref >59)
Globulin, Total: 3.1 g/dL (ref 1.5–4.5)
Glucose: 92 mg/dL (ref 65–99)
Potassium: 4.1 mmol/L (ref 3.5–5.2)
Sodium: 145 mmol/L — ABNORMAL HIGH (ref 134–144)
Total Protein: 7.7 g/dL (ref 6.0–8.5)

## 2019-01-06 LAB — ALLERGENS W/TOTAL IGE AREA 2
Alternaria Alternata IgE: <0.1 kU/L
Aspergillus Fumigatus IgE: 0.38 kU/L — AB
Bermuda Grass IgE: <0.1 kU/L
Cat Dander IgE: <0.1 kU/L
Cedar, Mountain IgE: <0.1 kU/L
Cladosporium Herbarum IgE: 0.12 kU/L — AB
Cockroach, German IgE: <0.1 kU/L
Common Silver Birch IgE: <0.1 kU/L
Cottonwood IgE: <0.1 kU/L
D Farinae IgE: <0.1 kU/L
D Pteronyssinus IgE: <0.1 kU/L
Dog Dander IgE: 0.19 kU/L — AB
Elm, American IgE: <0.1 kU/L
IgE (Immunoglobulin E), Serum: 81 IU/mL (ref 6–495)
Johnson Grass IgE: <0.1 kU/L
Maple/Box Elder IgE: <0.1 kU/L
Mouse Urine IgE: <0.1 kU/L
Oak, White IgE: <0.1 kU/L
Pecan, Hickory IgE: <0.1 kU/L
Penicillium Chrysogen IgE: <0.1 kU/L
Pigweed, Rough IgE: <0.1 kU/L
Ragweed, Short IgE: <0.1 kU/L
Sheep Sorrel IgE Qn: <0.1 kU/L
Timothy Grass IgE: <0.1 kU/L
White Mulberry IgE: <0.1 kU/L

## 2019-01-06 LAB — ALPHA-GAL PANEL
Alpha Gal IgE*: <0.1 kU/L (ref <0.10)
Beef (Bos spp) IgE: <0.1 kU/L (ref <0.35)
Class Interpretation: 0
Class Interpretation: 0
Class Interpretation: 0
Lamb/Mutton (Ovis spp) IgE: <0.1 kU/L (ref <0.35)
Pork (Sus spp) IgE: <0.1 kU/L (ref <0.35)

## 2019-01-06 LAB — CHRONIC URTICARIA: cu index: <1.3 (ref <10)

## 2019-01-06 LAB — ALLERGEN PROFILE, SHELLFISH
Clam IgE: <0.1 kU/L
F023-IgE Crab: <0.1 kU/L
F080-IgE Lobster: <0.1 kU/L
F290-IgE Oyster: <0.1 kU/L
Scallop IgE: <0.1 kU/L
Shrimp IgE: <0.1 kU/L

## 2019-01-06 LAB — CBC WITH DIFFERENTIAL
Basophils Absolute: 0 10*3/uL (ref 0.0–0.2)
Basos: 0 %
EOS (ABSOLUTE): 0 10*3/uL (ref 0.0–0.4)
Eos: 0 %
Hematocrit: 37.9 % (ref 34.0–46.6)
Hemoglobin: 12.5 g/dL (ref 11.1–15.9)
Immature Grans (Abs): 0.1 10*3/uL (ref 0.0–0.1)
Immature Granulocytes: 1 %
Lymphocytes Absolute: 1.3 10*3/uL (ref 0.7–3.1)
Lymphs: 22 %
MCH: 30.2 pg (ref 26.6–33.0)
MCHC: 33 g/dL (ref 31.5–35.7)
MCV: 92 fL (ref 79–97)
Monocytes Absolute: 0.5 10*3/uL (ref 0.1–0.9)
Monocytes: 8 %
Neutrophils Absolute: 4.2 10*3/uL (ref 1.4–7.0)
Neutrophils: 69 %
RBC: 4.14 x10E6/uL (ref 3.77–5.28)
RDW: 13.1 % (ref 11.7–15.4)
WBC: 6.1 10*3/uL (ref 3.4–10.8)

## 2019-01-06 LAB — THYROID ANTIBODIES
Thyroglobulin Antibody: <1 IU/mL (ref 0.0–0.9)
Thyroperoxidase Ab SerPl-aCnc: 23 IU/mL (ref 0–34)

## 2019-01-06 LAB — TRYPTASE: Tryptase: 10.3 ug/L (ref 2.2–13.2)

## 2019-01-06 LAB — ALLERGEN, TOMATO F25: Allergen Tomato, IgE: <0.1 kU/L

## 2019-01-09 ENCOUNTER — Other Ambulatory Visit: Payer: Medicare Other | Admitting: Internal Medicine

## 2019-01-09 ENCOUNTER — Other Ambulatory Visit: Payer: Self-pay

## 2019-01-09 DIAGNOSIS — R7989 Other specified abnormal findings of blood chemistry: Secondary | ICD-10-CM

## 2019-01-09 LAB — BASIC METABOLIC PANEL
BUN/Creatinine Ratio: 12 (calc) (ref 6–22)
BUN: 13 mg/dL (ref 7–25)
CO2: 29 mmol/L (ref 20–32)
Calcium: 9.5 mg/dL (ref 8.6–10.4)
Chloride: 103 mmol/L (ref 98–110)
Creat: 1.09 mg/dL — ABNORMAL HIGH (ref 0.50–0.99)
Glucose, Bld: 105 mg/dL — ABNORMAL HIGH (ref 65–99)
Potassium: 4.6 mmol/L (ref 3.5–5.3)
Sodium: 140 mmol/L (ref 135–146)

## 2019-01-10 ENCOUNTER — Telehealth: Payer: Self-pay | Admitting: *Deleted

## 2019-01-10 MED ORDER — PREDNISONE 10 MG PO TABS: 20.0000 mg | ORAL_TABLET | Freq: Every day | ORAL | 0 refills | Status: DC

## 2019-01-10 NOTE — Telephone Encounter (Signed)
If she is having hives and swelling on the 3 medication regimen (H1/H2 blockers and singulair) then we can move forward with Xolair monthly injections for improved controlled.  Based on her labs which were rather reassuring her hives/swelling are spontaneous.  I am glad to see her creatinine (kidney function level) improved with hydration thus not concerned that she has a kidney process triggering hives.    Please inform she can take up to 2 tablets twice a day of her antihistamine (allegra, xyzal, zyrtec or claritin) to see if this will help provide additional control.    Will go ahead and let Tammy know to start approval process.    Does she have Mychart activated?  If she has Mychart she can send a picture of her face or video visit that can help determine if she would benefit acutely from short prednisone burst otherwise if someone has an opening in [place] and wants to be seen today that is fine.

## 2019-01-10 NOTE — Telephone Encounter (Signed)
Patient would like to go ahead with trying the Xolair for her hives. She states her face is swollen today and would like to come in ASAP. Please advise.

## 2019-01-10 NOTE — Telephone Encounter (Signed)
Called patient nd she still has some facial swelling. I contacted Dr Nelva Bush and will send prednisone 20mg  for 5 days for her swelling.  I did advise patient that we could go ahead with her Xolair start since she is MCR and supplement we would do buy and bill.  She advised her PCP done labs yesterday and she wants to wait until those are back and will reach back out to me

## 2019-01-10 NOTE — Addendum Note (Signed)
Addended by: Carin Hock on: 01/10/2019 05:05 PM   Modules accepted: Orders

## 2019-01-11 ENCOUNTER — Telehealth: Payer: Self-pay | Admitting: Internal Medicine

## 2019-01-11 NOTE — Telephone Encounter (Signed)
She needs to contact the allergist who did these tests and let them know her symptoms

## 2019-01-11 NOTE — Telephone Encounter (Signed)
Left detailed message.   

## 2019-01-11 NOTE — Telephone Encounter (Signed)
Joanna Ray [phone]  [name] called to say that she is still expericing swelling in her lips,fingers and eyes. She also has splotches on her inner thighs near her behind, upper thighs and leg. She stated that she has stop taking her lisinopril-hydrochlorothiazide (ZESTORETIC) 20-12.5 MG tablet  Because she thinks this is what is causing it.  She was also wanting to get lab results if they are in.

## 2019-01-13 ENCOUNTER — Other Ambulatory Visit: Payer: Self-pay

## 2019-01-13 ENCOUNTER — Encounter: Payer: Self-pay | Admitting: Internal Medicine

## 2019-01-13 ENCOUNTER — Ambulatory Visit (INDEPENDENT_AMBULATORY_CARE_PROVIDER_SITE_OTHER): Payer: Medicare Other | Admitting: Internal Medicine

## 2019-01-13 DIAGNOSIS — I10 Essential (primary) hypertension: Secondary | ICD-10-CM

## 2019-01-13 DIAGNOSIS — T783XXD Angioneurotic edema, subsequent encounter: Secondary | ICD-10-CM

## 2019-01-13 DIAGNOSIS — F418 Other specified anxiety disorders: Secondary | ICD-10-CM | POA: Diagnosis not present

## 2019-01-13 MED ORDER — OLMESARTAN MEDOXOMIL-HCTZ 20-12.5 MG PO TABS: 1.0000 | ORAL_TABLET | Freq: Every day | ORAL | 1 refills | Status: DC

## 2019-01-13 NOTE — Progress Notes (Signed)
   Subjective:    Patient ID: Joanna Ray, female    DOB: [birth year], 68 y.o.   MRN: [id number]  HPI 68 year old female in today for follow-up and discussion regarding recurrent angioedema.  She thinks it may be her antihypertensive medication.  She has had extensive testing by allergist.  Says she continues to have intermittent issues with lip swelling.  Admits to being anxious and depressed.  Feels trapped and cannot travel as she used to.  Is unhappy with this.  I think this may be triggering episodes of angioedema.    Review of Systems see above     Objective:   Physical Exam  Blood pressure 140/80 pulse 68 temperature 98 degrees orally BMI 32.44  No evidence of angioedema today.  No rashes.  She is anxious and admits that pandemic has been very stressful.      Assessment & Plan:  History of angioedema-excellent work-up by Dr. Nelva Bush  Anxiety depression see below- may need counseling  Essential hypertension-blood pressure could be under better control.  I do not think her current blood pressure medicine is causing angioedema but we are going to change her to Benicar 20/12.5 mg daily.  I have prescribed Xanax 0.5 mg twice daily as needed for anxiety.  She will start Lexapro 10 mg daily for depression.  Follow-up October 2.  Dr. Nelva Bush prescribed short course of prednisone for her on 9/15 for 5 days.  Plan: She will start Lexapro 10 mg daily and take Xanax twice daily as needed.  Benicar 20/12.5 daily in place of lisinopril HCTZ  25 minutes spent with patient including medical decision making, counseling regarding anxiety depression and physical exam.

## 2019-01-21 ENCOUNTER — Encounter: Payer: Self-pay | Admitting: Internal Medicine

## 2019-01-21 DIAGNOSIS — T783XXA Angioneurotic edema, initial encounter: Secondary | ICD-10-CM | POA: Insufficient documentation

## 2019-01-21 NOTE — Patient Instructions (Signed)
Discontinue lisinopril HCTZ.  Start Benicar 20/12.5 and follow-up October 2.  Start Lexapro 10 mg daily and take Xanax twice daily as needed for anxiety.  Consider counseling.  Follow-up as needed with Dr. Nelva Bush for angioedema and carry EpiPen.

## 2019-01-27 ENCOUNTER — Other Ambulatory Visit: Payer: Self-pay

## 2019-01-27 ENCOUNTER — Encounter: Payer: Self-pay | Admitting: Internal Medicine

## 2019-01-27 ENCOUNTER — Ambulatory Visit (INDEPENDENT_AMBULATORY_CARE_PROVIDER_SITE_OTHER): Payer: Medicare Other | Admitting: Internal Medicine

## 2019-01-27 VITALS — BP 130/80 | HR 60 | Temp 97.8°F | Ht 66.0 in | Wt 200.0 lb

## 2019-01-27 DIAGNOSIS — I10 Essential (primary) hypertension: Secondary | ICD-10-CM | POA: Diagnosis not present

## 2019-01-27 DIAGNOSIS — T783XXD Angioneurotic edema, subsequent encounter: Secondary | ICD-10-CM

## 2019-01-27 DIAGNOSIS — F329 Major depressive disorder, single episode, unspecified: Secondary | ICD-10-CM

## 2019-01-27 DIAGNOSIS — F419 Anxiety disorder, unspecified: Secondary | ICD-10-CM

## 2019-01-27 DIAGNOSIS — Z23 Encounter for immunization: Secondary | ICD-10-CM

## 2019-01-27 DIAGNOSIS — F32A Depression, unspecified: Secondary | ICD-10-CM

## 2019-02-19 NOTE — Patient Instructions (Addendum)
Continue Benicar HCTZ 20/12.5 daily.  Continue Xanax and Lexapro.  Follow-up with allergist in November.  Continue to monitor blood pressure at home.  Flu vaccine given.

## 2019-02-19 NOTE — Progress Notes (Signed)
   Subjective:    Patient ID: Joanna Ray, female    DOB: 13-Jun-1950, 68 y.o.   MRN: [id number]  HPI At last visit September 18, she was having issues with angioedema.  She thought it might be due to antihypertensive medication.  She had extensive testing by allergist but had continued with intermittent issues with lip swelling.  At that time admitted to being anxious and depressed due to confinement with the pandemic.  It was my feeling that anxiety was triggering episodes of angioedema.  I prescribed Xanax and started her on Lexapro.  Allergist had treated her with prednisone in September 15 for 5 days.  I changed her antihypertensive medication to Benicar 20/12.5 milligrams daily in place of lisinopril HCTZ.  At this point she is feeling much better.  She has had no more episodes of angioedema.  She has been trying to get out and do some day trips which is helped.    Review of Systems no new complaints     Objective:   Physical Exam Blood pressure today 130/80, pulse 60 regular, pulse oximetry 99% BMI 32.28 neck is supple without JVD thyromegaly or carotid bruits.  Chest clear.  Cardiac exam regular rate and rhythm.  No lower extremity edema.  Affect is much improved.       Assessment & Plan:  Angioedema-no new episodes  Essential hypertension-stable blood pressure on Benicar HCTZ 20/12.5 daily  Anxiety depression-improved with Xanax and Lexapro  Plan: Basic metabolic panel drawn and is stable.  Continue Benicar HCTZ 20/12.5 daily.  Continue Lexapro and Xanax.  Flu vaccine given.  She has follow-up with allergist in early November.  20 minutes spent with patient.

## 2019-02-22 DIAGNOSIS — L239 Allergic contact dermatitis, unspecified cause: Secondary | ICD-10-CM | POA: Diagnosis not present

## 2019-02-22 DIAGNOSIS — Z131 Encounter for screening for diabetes mellitus: Secondary | ICD-10-CM | POA: Diagnosis not present

## 2019-02-22 DIAGNOSIS — Z1329 Encounter for screening for other suspected endocrine disorder: Secondary | ICD-10-CM | POA: Diagnosis not present

## 2019-02-22 DIAGNOSIS — K5901 Slow transit constipation: Secondary | ICD-10-CM | POA: Diagnosis not present

## 2019-03-01 ENCOUNTER — Ambulatory Visit (INDEPENDENT_AMBULATORY_CARE_PROVIDER_SITE_OTHER): Payer: Medicare Other | Admitting: Allergy

## 2019-03-01 ENCOUNTER — Other Ambulatory Visit: Payer: Self-pay

## 2019-03-01 ENCOUNTER — Encounter: Payer: Self-pay | Admitting: Allergy

## 2019-03-01 VITALS — BP 146/82 | HR 62 | Temp 98.0°F | Resp 18

## 2019-03-01 DIAGNOSIS — T783XXD Angioneurotic edema, subsequent encounter: Secondary | ICD-10-CM | POA: Diagnosis not present

## 2019-03-01 DIAGNOSIS — L509 Urticaria, unspecified: Secondary | ICD-10-CM | POA: Diagnosis not present

## 2019-03-01 DIAGNOSIS — E7800 Pure hypercholesterolemia, unspecified: Secondary | ICD-10-CM | POA: Diagnosis not present

## 2019-03-01 DIAGNOSIS — K5901 Slow transit constipation: Secondary | ICD-10-CM | POA: Diagnosis not present

## 2019-03-01 DIAGNOSIS — R7303 Prediabetes: Secondary | ICD-10-CM | POA: Diagnosis not present

## 2019-03-01 DIAGNOSIS — I10 Essential (primary) hypertension: Secondary | ICD-10-CM | POA: Diagnosis not present

## 2019-03-01 DIAGNOSIS — F418 Other specified anxiety disorders: Secondary | ICD-10-CM | POA: Diagnosis not present

## 2019-03-01 DIAGNOSIS — E6609 Other obesity due to excess calories: Secondary | ICD-10-CM | POA: Diagnosis not present

## 2019-03-01 DIAGNOSIS — T7800XD Anaphylactic reaction due to unspecified food, subsequent encounter: Secondary | ICD-10-CM

## 2019-03-01 DIAGNOSIS — L239 Allergic contact dermatitis, unspecified cause: Secondary | ICD-10-CM | POA: Diagnosis not present

## 2019-03-01 DIAGNOSIS — T783XXA Angioneurotic edema, initial encounter: Secondary | ICD-10-CM | POA: Diagnosis not present

## 2019-03-01 NOTE — Progress Notes (Addendum)
Follow-up Note  RE: Joanna WHITTER MRN: [id number] DOB: 1950/08/09 Date of Office Visit: 03/01/2019   History of present illness: Joanna Ray is a 68 y.o. female presenting today for follow-up of urticaria with angioedema. She was last seen in the office on 12/28/2018 by myself.   She states she continues to have hive and swelling episodes about twice a week.  However she reports she has not been taking her "green pill" which should be the Allegra in the past 2 weeks.  She states she is still taking the cimentidine and has not run out yet but does have the pepcid already to start when the cimetidine runs out.  She is taking singulair daily.  She reports having lip swelling just yesterday which has gone down.  She states the swelling is best improved with use of as needed benadryl.   She would like to eat shrimp again if possible.   Review of systems: Review of Systems  Constitutional: Negative for chills, fever and malaise/fatigue.  HENT: Negative for congestion, ear discharge, nosebleeds and sore throat.   Eyes: Negative for pain, discharge and redness.  Respiratory: Negative.   Cardiovascular: Negative.   Gastrointestinal: Negative.   Musculoskeletal: Negative.   Skin: Positive for itching and rash.  Neurological: Negative.     All other systems negative unless noted above in HPI  Past medical/social/surgical/family history have been reviewed and are unchanged unless specifically indicated below.  No changes  Medication List: Current Outpatient Medications  Medication Sig Dispense Refill  . ALPRAZolam (XANAX) 0.5 MG tablet Take 1 tablet (0.5 mg total) by mouth 2 (two) times daily as needed for anxiety. 60 tablet 1  . ALPRAZolam (XANAX) 0.5 MG tablet Take 1 tablet by mouth twice daily as needed for anxiety 60 tablet 0  . aspirin 81 MG tablet Take 81 mg by mouth daily.    . cholecalciferol (VITAMIN D) 1000 UNITS tablet Take 1,000 Units by mouth daily.    .  cimetidine (TAGAMET) 400 MG tablet Take 400 mg by mouth 3 (three) times daily.    . COD LIVER OIL PO Take 1 capsule by mouth daily.     Marland Kitchen EPINEPHrine 0.3 mg/0.3 mL IJ SOAJ injection Inject 0.3 mLs (0.3 mg total) into the muscle as needed for anaphylaxis. 1 each 5  . escitalopram (LEXAPRO) 10 MG tablet Take 1 tablet (10 mg total) by mouth daily. 30 tablet 5  . hydrOXYzine (VISTARIL) 25 MG capsule TAKE 1 TO 2 CAPSULES BY MOUTH EVERY DAY AT BEDTIME AS NEEDED FOR ITCHING (MAY CAUSE DROWSINESS)    . ketoconazole (NIZORAL) 2 % cream Apply 1 application topically daily as needed. 60 g 0  . mupirocin ointment (BACTROBAN) 2 % Apply to infected leg lesion twice a day 22 g 0  . olmesartan-hydrochlorothiazide (BENICAR HCT) 20-12.5 MG tablet Take 1 tablet by mouth daily. 30 tablet 1  . rosuvastatin (CRESTOR) 20 MG tablet Take 0.5 tablets (10 mg total) by mouth daily. Take 1/2 tablet 90 tablet 1  . vitamin B-12 (CYANOCOBALAMIN) 100 MCG tablet Take 50 mcg by mouth daily.    . vitamin E 100 UNIT capsule Take 100 Units by mouth daily.    . predniSONE (DELTASONE) 10 MG tablet Take 2 tablets (20 mg total) by mouth daily with breakfast. (Patient not taking: Reported on 03/01/2019) 10 tablet 0   No current facility-administered medications for this visit.      Known medication allergies: Allergies  Allergen Reactions  .  Shellfish Allergy Swelling    Swelling of lips     Physical examination: Blood pressure (!) 146/82, pulse 62, temperature 98 F (36.7 C), temperature source Temporal, resp. rate 18, SpO2 98 %.  General: Alert, interactive, in no acute distress. HEENT: PERRLA, TMs pearly gray, turbinates non-edematous without discharge, post-pharynx non erythematous.  No visible facial edema. Neck: Supple without lymphadenopathy. Lungs: Clear to auscultation without wheezing, rhonchi or rales. {no increased work of breathing. CV: Normal S1, S2 without murmurs. Abdomen: Nondistended, nontender. Skin: Warm  and dry, without lesions or rashes. Extremities:  No clubbing, cyanosis or edema. Neuro:   Grossly intact.  Diagnositics/Labs: Labs:  Component     Latest Ref Rng & Units 12/28/2018  IgE (Immunoglobulin E), Serum     6 - 495 IU/mL 81  D Pteronyssinus IgE     Class 0 kU/L <0.10  D Farinae IgE     Class 0 kU/L <0.10  Cat Dander IgE     Class 0 kU/L <0.10  Dog Dander IgE     Class 0/I kU/L 0.19 (A)  [place] Grass IgE     Class 0 kU/L <0.10  Timothy Grass IgE     Class 0 kU/L <0.10  Johnson Grass IgE     Class 0 kU/L <0.10  Cockroach, German IgE     Class 0 kU/L <0.10  Penicillium Chrysogen IgE     Class 0 kU/L <0.10  Cladosporium Herbarum IgE     Class 0/I kU/L 0.12 (A)  Aspergillus Fumigatus IgE     Class I kU/L 0.38 (A)  Alternaria Alternata IgE     Class 0 kU/L <0.10  Maple/Box Elder IgE     Class 0 kU/L <0.10  Common Silver Wendee Copp IgE     Class 0 kU/L <0.10  Cedar, [place] IgE     Class 0 kU/L <0.10  Oak, White IgE     Class 0 kU/L <0.10  Elm, American IgE     Class 0 kU/L <0.10  C[place]wood IgE     Class 0 kU/L <0.10  Pecan, Hickory IgE     Class 0 kU/L <0.10  White Mulberry IgE     Class 0 kU/L <0.10  Ragweed, Short IgE     Class 0 kU/L <0.10  Pigweed, Rough IgE     Class 0 kU/L <0.10  Sheep Sorrel IgE Qn     Class 0 kU/L <0.10  Mouse Urine IgE     Class 0 kU/L <0.10  WBC     3.4 - 10.8 x10E3/uL 6.1  RBC     3.77 - 5.28 x10E6/uL 4.14  Hemoglobin     11.1 - 15.9 g/dL 12.5  HCT     34.0 - 46.6 % 37.9  MCV     79 - 97 fL 92  MCH     26.6 - 33.0 pg 30.2  MCHC     31.5 - 35.7 g/dL 33.0  RDW     11.7 - 15.4 % 13.1  Neutrophils     Not Estab. % 69  Lymphs     Not Estab. % 22  Monocytes     Not Estab. % 8  Eos     Not Estab. % 0  Basos     Not Estab. % 0  NEUT#     1.4 - 7.0 x10E3/uL 4.2  Lymphocyte #     0.7 - 3.1 x10E3/uL 1.3  Monocytes Absolute     0.1 - 0.9  x10E3/uL 0.5  EOS (ABSOLUTE)     0.0 - 0.4 x10E3/uL 0.0  Basophils  Absolute     0.0 - 0.2 x10E3/uL 0.0  Immature Granulocytes     Not Estab. % 1  Immature Grans (Abs)     0.0 - 0.1 x10E3/uL 0.1  Glucose     65 - 99 mg/dL 92  BUN     8 - 27 mg/dL 15  Creatinine     0.57 - 1.00 mg/dL 1.23 (H)  GFR, Est Non African American     >59 mL/min/1.73 45 (L)  GFR, Est African American     >59 mL/min/1.73 52 (L)  BUN/Creatinine Ratio     12 - 28 12  Sodium     134 - 144 mmol/L 145 (H)  Potassium     3.5 - 5.2 mmol/L 4.1  Chloride     96 - 106 mmol/L 104  CO2     20 - 29 mmol/L 25  Calcium     8.7 - 10.3 mg/dL 9.7  Total Protein     6.0 - 8.5 g/dL 7.7  Albumin     3.8 - 4.8 g/dL 4.6  Globulin, Total     1.5 - 4.5 g/dL 3.1  Albumin/Globulin Ratio     1.2 - 2.2 1.5  Total Bilirubin     0.0 - 1.2 mg/dL 0.8  Alkaline Phosphatase     39 - 117 IU/L 54  AST     0 - 40 IU/L 16  ALT     0 - 32 IU/L 12  Beef (Bos spp) IgE     <0.35 kU/L <0.10  Class Interpretation      0  Lamb/Mutton (Ovis spp) IgE     <0.35 kU/L <0.10  Class Interpretation      0  Pork (Sus spp) IgE     <0.35 kU/L <0.10  Class Interpretation      0  Alpha Gal IgE*     <0.10 kU/L <0.10  Clam IgE     Class 0 kU/L <0.10  F023-IgE Crab     Class 0 kU/L <0.10  Shrimp IgE     Class 0 kU/L <0.10  Scallop IgE     Class 0 kU/L <0.10  F290-IgE Oyster     Class 0 kU/L <0.10  F080-IgE Lobster     Class 0 kU/L <0.10  Thyroperoxidase Ab SerPl-aCnc     0 - 34 IU/mL 23  Thyroglobulin Antibody     0.0 - 0.9 IU/mL <1.0  cu index     <10 <1.3  Tryptase     2.2 - 13.2 ug/L 10.3  Allergen Tomato, IgE     Class 0 kU/L <0.10    Assessment and plan: Urticaria with angioedema Anaphylaxis due to food  - at this time etiology of hives and swelling is unknown.  Hives can be caused by a variety of different triggers including illness/infection, foods, medications, stings, exercise, pressure, vibrations, extremes of temperature to name a few however majority of the time there is no  identifiable trigger.    - labwork was negative for tomato and shellfish allergy.   Recommend performing a shrimp challenge in the office.    - labwork was reassuring however did show sensitivity to dog dander and molds.  Allergen avoidance measures provided.    - for management of hives/swelling recommend the following high-dose antihistamine regimen:  Allegra 180mg  1 tablet twice a day.   Continue Cimetidine 400mg   1 tablet twice a day with Allegra (take until gone).  Once you run out of Cimetidine then replace with Pepcid 20mg  1 tablet twice a day.   Continue Singulair 10mg  daily at bedtime   - Xolair monthly injections discussed today as step-up therapy for improved hive and swelling control.   Protocol, benefits/risks discussed today.   Will give medication management above more time on complete regimen before deciding if needing to proceed to Xolair.    - Follow emergency action plan in case of future reaction and have access to your Epipen  Follow-up 1 months via tele-visit to discuss symptoms and if needing to proceed to Xolair injections  I appreciate the opportunity to take part in Mikyla's care. Please do not hesitate to contact me with questions.  Sincerely,   Prudy Feeler, MD Allergy/Immunology [facility]

## 2019-03-01 NOTE — Patient Instructions (Signed)
Swelling and Hives Anaphylaxis due to food  - at this time etiology of hives and swelling is unknown.  Hives can be caused by a variety of different triggers including illness/infection, foods, medications, stings, exercise, pressure, vibrations, extremes of temperature to name a few however majority of the time there is no identifiable trigger.    - labwork was negative for tomato and shellfish allergy.   Recommend performing a shrimp challenge in the office.    - labwork was reassuring however did show sensitivity to dog dander and molds.  Allergen avoidance measures provided.    - for management of hives/swelling recommend the following high-dose antihistamine regimen:  Allegra 180mg  1 tablet twice a day.   Continue Cimetidine 400mg  1 tablet twice a day with Allegra (take until gone).  Once you run out of Cimetidine then replace with Pepcid 20mg  1 tablet twice a day.   Continue Singulair 10mg  daily at bedtime   - Xolair monthly injections discussed today as step-up therapy for improved hive and swelling control.   Protocol, benefits/risks discussed today.   Will give medication management above more time on complete regimen before deciding if needing to proceed to Xolair.    - Follow emergency action plan in case of future reaction and have access to your Epipen  Follow-up 1 months via tele-visit to discuss symptoms and if needing to proceed to Xolair injections

## 2019-03-11 ENCOUNTER — Other Ambulatory Visit: Payer: Self-pay | Admitting: Internal Medicine

## 2019-03-14 ENCOUNTER — Other Ambulatory Visit: Payer: Self-pay | Admitting: Internal Medicine

## 2019-03-31 ENCOUNTER — Encounter: Payer: Self-pay | Admitting: Allergy

## 2019-03-31 ENCOUNTER — Ambulatory Visit (INDEPENDENT_AMBULATORY_CARE_PROVIDER_SITE_OTHER): Payer: Medicare Other | Admitting: Allergy

## 2019-03-31 ENCOUNTER — Other Ambulatory Visit: Payer: Self-pay

## 2019-03-31 DIAGNOSIS — L509 Urticaria, unspecified: Secondary | ICD-10-CM

## 2019-03-31 DIAGNOSIS — T783XXD Angioneurotic edema, subsequent encounter: Secondary | ICD-10-CM

## 2019-03-31 DIAGNOSIS — T7800XD Anaphylactic reaction due to unspecified food, subsequent encounter: Secondary | ICD-10-CM | POA: Diagnosis not present

## 2019-03-31 NOTE — Progress Notes (Signed)
RE: Joanna Ray MRN: [id number] DOB: Oct 02, 1950 Date of Telemedicine Visit: 03/31/2019  Referring provider: [name], MD Primary care provider: [name], MD  Chief Complaint: Urticaria   Telemedicine Follow Up Visit via Telephone: I connected with Joanna Ray for a follow up on 03/31/19 by telephone and verified that I am speaking with the correct person using two identifiers.   I discussed the limitations, risks, security and privacy concerns of performing an evaluation and management service by telephone and the availability of in person appointments. I also discussed with the patient that there may be a patient responsible charge related to this service. The patient expressed understanding and agreed to proceed.  Patient is at home.  Provider is at the office.  Visit start time: 1116 Visit end time: [place] consent/check in by: [place] consent and medical assistant/nurse: Sheliah Ray  History of Present Illness: She is a 68 y.o. female, who is being followed for urticaria with angioedema and anaphylaxis due to food. Her previous allergy office visit was on 03/01/2019 with Dr. Nelva Ray.   She states she has been doing well in regards to hives and swelling she had a mild episode after last visit but since then has been hive and swelling free.    However she states she is having constipation and states she has been taking the cimetidine with pepcid instead of cimetidine with allegra.  She states she only has like 10 tablets of the cimetidine left.  She is taking the singulair daily.    Assessment and Plan: Joanna Ray is a 68 y.o. female with: Urticaria and angioedema Anaphylaxis due to food  - at this time etiology of hives and swelling is unknown.  Hives can be caused by a variety of different triggers including illness/infection, foods, medications, stings, exercise, pressure, vibrations, extremes of temperature to name a few however majority of the  time there is no identifiable trigger.    - labwork was negative for tomato and shellfish allergy.   Recommend performing a shrimp challenge in the office.    - labwork was reassuring however did show sensitivity to dog dander and molds.  Allergen avoidance measures provided.     - for management of hives/swelling recommend the following high-dose antihistamine regimen:  Allegra 180mg  1 tablet with Cimetidine 400mg  1 tablet daily. When you run out of Cimetidine then start Pepcid and take Allegra with Pepcid daily. Continue Singulair 10mg  daily at bedtime .  If you are having hives and swelling still on Allegra with either Cimetidine or Pepcid then take both medications twice a day.   - Xolair monthly injections discussed today as step-up therapy if needed to improve hive and swelling control.   Protocol, benefits/risks discussed today.   Will give medication management above more time on complete regimen before deciding if needing to proceed to Xolair.    - Follow emergency action plan in case of future reaction and have access to your Epipen  Follow-up 2-3 months or sooner if needed  Diagnostics: None.  Medication List:  Current Outpatient Medications  Medication Sig Dispense Refill  . ALPRAZolam (XANAX) 0.5 MG tablet Take 1 tablet (0.5 mg total) by mouth 2 (two) times daily as needed for anxiety. 60 tablet 1  . ALPRAZolam (XANAX) 0.5 MG tablet Take 1 tablet by mouth twice daily as needed for anxiety 60 tablet 0  . aspirin 81 MG tablet Take 81 mg by mouth daily.    . cholecalciferol (VITAMIN D)  1000 UNITS tablet Take 1,000 Units by mouth daily.    . cimetidine (TAGAMET) 400 MG tablet Take 400 mg by mouth 3 (three) times daily.    . COD LIVER OIL PO Take 1 capsule by mouth daily.     Marland Kitchen EPINEPHrine 0.3 mg/0.3 mL IJ SOAJ injection Inject 0.3 mLs (0.3 mg total) into the muscle as needed for anaphylaxis. 1 each 5  . escitalopram (LEXAPRO) 10 MG tablet Take 1 tablet (10 mg total) by mouth  daily. 30 tablet 5  . famotidine (PEPCID) 20 MG tablet Take 20 mg by mouth 2 (two) times daily.    Marland Kitchen ketoconazole (NIZORAL) 2 % cream Apply 1 application topically daily as needed. 60 g 0  . mupirocin ointment (BACTROBAN) 2 % Apply to infected leg lesion twice a day 22 g 0  . olmesartan-hydrochlorothiazide (BENICAR HCT) 20-12.5 MG tablet Take 1 tablet by mouth once daily 90 tablet 0  . rosuvastatin (CRESTOR) 20 MG tablet Take 0.5 tablets (10 mg total) by mouth daily. Take 1/2 tablet 90 tablet 1  . vitamin B-12 (CYANOCOBALAMIN) 100 MCG tablet Take 50 mcg by mouth daily.    . vitamin E 100 UNIT capsule Take 100 Units by mouth daily.    . hydrOXYzine (VISTARIL) 25 MG capsule TAKE 1 TO 2 CAPSULES BY MOUTH EVERY DAY AT BEDTIME AS NEEDED FOR ITCHING (MAY CAUSE DROWSINESS)     No current facility-administered medications for this visit.    Allergies: Allergies  Allergen Reactions  . Shellfish Allergy Swelling    Swelling of lips   I reviewed her past medical history, social history, family history, and environmental history and no significant changes have been reported from previous visit on 03/01/2019.  Review of Systems  Constitutional: Negative for chills and fever.  HENT: Negative.   Eyes: Negative.   Respiratory: Negative.   Gastrointestinal: Negative.   Musculoskeletal: Negative.   Skin: Positive for rash.  Neurological: Negative.    Objective: Physical Exam Not obtained as encounter was done via telephone.   Previous notes and tests were reviewed.  I discussed the assessment and treatment plan with the patient. The patient was provided an opportunity to ask questions and all were answered. The patient agreed with the plan and demonstrated an understanding of the instructions.   The patient was advised to call back or seek an in-person evaluation if the symptoms worsen or if the condition fails to improve as anticipated.  I provided 13 minutes of non-face-to-face time during  this encounter.  It was my pleasure to participate in Joanna Ray's care today. Please feel free to contact me with any questions or concerns.   Sincerely,  Joanna Salberg Charmian Muff, MD

## 2019-03-31 NOTE — Patient Instructions (Signed)
Swelling and Hives Anaphylaxis due to food  - at this time etiology of hives and swelling is unknown.  Hives can be caused by a variety of different triggers including illness/infection, foods, medications, stings, exercise, pressure, vibrations, extremes of temperature to name a few however majority of the time there is no identifiable trigger.    - labwork was negative for tomato and shellfish allergy.   Recommend performing a shrimp challenge in the office.    - labwork was reassuring however did show sensitivity to dog dander and molds.  Allergen avoidance measures provided.     - for management of hives/swelling recommend the following high-dose antihistamine regimen:  Allegra 180mg  1 tablet with Cimetidine 400mg  1 tablet daily. When you run out of Cimetidine then start Pepcid and take Allegra with Pepcid daily. Continue Singulair 10mg  daily at bedtime .  If you are having hives and swelling still on Allegra with either Cimetidine or Pepcid then take both medications twice a day.   - Xolair monthly injections discussed today as step-up therapy if needed to improve hive and swelling control.   Protocol, benefits/risks discussed today.   Will give medication management above more time on complete regimen before deciding if needing to proceed to Xolair.    - Follow emergency action plan in case of future reaction and have access to your Epipen  Follow-up 2-3 months or sooner if needed

## 2019-04-05 ENCOUNTER — Telehealth: Payer: Self-pay | Admitting: Internal Medicine

## 2019-04-05 ENCOUNTER — Other Ambulatory Visit: Payer: Self-pay | Admitting: Internal Medicine

## 2019-04-05 DIAGNOSIS — Z1231 Encounter for screening mammogram for malignant neoplasm of breast: Secondary | ICD-10-CM

## 2019-04-05 MED ORDER — OLMESARTAN MEDOXOMIL-HCTZ 20-12.5 MG PO TABS: 1.0000 | ORAL_TABLET | Freq: Every day | ORAL | 1 refills | Status: DC

## 2019-04-05 NOTE — Telephone Encounter (Signed)
Received Fax RX request from  [facility]  Medication - olmesartan-hydrochlorothiazide (BENICAR HCT) 20-12.5 MG tablet    Last Refill - 03/13/19  Last OV - 01/27/19  Last CPE - 09/02/18  Next Appointment -

## 2019-04-12 ENCOUNTER — Ambulatory Visit
Admission: RE | Admit: 2019-04-12 | Discharge: 2019-04-12 | Disposition: A | Discharge status: Home / Self Care | Payer: Medicare Other | Source: Ambulatory Visit | Attending: Internal Medicine | Admitting: Internal Medicine

## 2019-04-12 ENCOUNTER — Other Ambulatory Visit: Payer: Self-pay

## 2019-04-12 DIAGNOSIS — Z1231 Encounter for screening mammogram for malignant neoplasm of breast: Secondary | ICD-10-CM

## 2019-04-28 DIAGNOSIS — C801 Malignant (primary) neoplasm, unspecified: Secondary | ICD-10-CM

## 2019-04-28 HISTORY — DX: Malignant (primary) neoplasm, unspecified: C80.1

## 2019-05-10 ENCOUNTER — Other Ambulatory Visit: Payer: Self-pay | Admitting: Internal Medicine

## 2019-05-10 DIAGNOSIS — H8110 Benign paroxysmal vertigo, unspecified ear: Secondary | ICD-10-CM

## 2019-05-10 DIAGNOSIS — F4321 Adjustment disorder with depressed mood: Secondary | ICD-10-CM

## 2019-05-11 ENCOUNTER — Other Ambulatory Visit: Payer: Self-pay | Admitting: Cardiology

## 2019-05-11 DIAGNOSIS — Z20822 Contact with and (suspected) exposure to covid-19: Secondary | ICD-10-CM | POA: Diagnosis not present

## 2019-05-12 ENCOUNTER — Other Ambulatory Visit: Payer: Medicare Other

## 2019-05-12 LAB — NOVEL CORONAVIRUS, NAA: SARS-CoV-2, NAA: NOT DETECTED

## 2019-06-07 DIAGNOSIS — Z23 Encounter for immunization: Secondary | ICD-10-CM | POA: Diagnosis not present

## 2019-06-29 ENCOUNTER — Other Ambulatory Visit: Payer: Self-pay

## 2019-06-29 ENCOUNTER — Encounter: Payer: Self-pay | Admitting: Allergy

## 2019-06-29 ENCOUNTER — Ambulatory Visit (INDEPENDENT_AMBULATORY_CARE_PROVIDER_SITE_OTHER): Payer: Medicare Other | Admitting: Allergy

## 2019-06-29 DIAGNOSIS — T783XXD Angioneurotic edema, subsequent encounter: Secondary | ICD-10-CM | POA: Diagnosis not present

## 2019-06-29 DIAGNOSIS — L509 Urticaria, unspecified: Secondary | ICD-10-CM

## 2019-06-29 DIAGNOSIS — T7800XD Anaphylactic reaction due to unspecified food, subsequent encounter: Secondary | ICD-10-CM

## 2019-06-29 MED ORDER — FAMOTIDINE 20 MG PO TABS: 20.0000 mg | ORAL_TABLET | Freq: Two times a day (BID) | ORAL | 1 refills | Status: DC

## 2019-06-29 MED ORDER — FEXOFENADINE HCL 180 MG PO TABS: 180.0000 mg | ORAL_TABLET | Freq: Every day | ORAL | 1 refills | Status: AC

## 2019-06-29 NOTE — Progress Notes (Signed)
RE: Joanna Ray MRN: [id number] DOB: 20-Dec-1950 Date of Telemedicine Visit: 06/29/2019  Referring provider: [name], MD Primary care provider: [name], MD  Chief Complaint: Urticaria   Telemedicine Follow Up Visit via Telephone: I connected with Joanna Ray for a follow up on 06/29/19 by telephone and verified that I am speaking with the correct person using two identifiers.   I discussed the limitations, risks, security and privacy concerns of performing an evaluation and management service by telephone and the availability of in person appointments. I also discussed with the patient that there may be a patient responsible charge related to this service. The patient expressed understanding and agreed to proceed.  Patient is at home.  Provider is at the office.  Visit start time: 1129 Visit end time: [place] consent/check in by: Geoffery Spruce Medical consent and medical assistant/nurse: Lamount Cohen  History of Present Illness: She is a 69 y.o. female, who is being followed for urticaria and anaphylaxis due to food. Her previous allergy office visit was on 03/31/2019 with Dr. Nelva Bush.   She states she has been doing well since her last visit without any major health changes, surgeries or hospitalizations.  In regards to her hives and swelling she states she has been hive and swelling free with the use of Allegra and Pepcid daily.  She states she is not taking the Singulair.  Since she has not had any worsening of her hives and swelling she has not needed to increase the antihistamine regimen.  She did finish out the cimetidine and transition over to the Pepcid dosing.  She continues to avoid shellfish but she is interested in a shrimp challenge.  She does have access to her epinephrine device and has not needed to use this.  Assessment and Plan: Joanna Ray is a 69 y.o. female with:   Urticaria with angioedema Anaphylaxis due to food  - at this time etiology of  hives and swelling is idiopathic or spontaneous.  Hives can be caused by a variety of different triggers including illness/infection, foods, medications, stings, exercise, pressure, vibrations, extremes of temperature to name a few however majority of the time there is no identifiable trigger.    - labwork was negative for tomato and shellfish allergy.   Recommend performing a shrimp challenge in the office sometime this summer or afterwards once you are hopefully able to come off of all antihistamines.    - labwork was reassuring however did show sensitivity to dog dander and molds.  Continue avoidance measures for dog dander and molds.   - for management of hives/swelling recommend the following high-dose antihistamine regimen: Continue Allegra 1 tablet daily with Pepcid 1 tablet daily. In April 2021 if you are still hive and swelling free then stop the Pepcid and only take Allegra daily. In May 2021 if you still remain hive and swelling free then stop the Allegra.    - Follow emergency action plan in case of future reaction which has been previously provided and have access to your Epipen  Follow-up 3-4 months or sooner if needed  Diagnostics: None.  Medication List:  Current Outpatient Medications  Medication Sig Dispense Refill  . ALPRAZolam (XANAX) 0.5 MG tablet Take 1 tablet (0.5 mg total) by mouth 2 (two) times daily as needed for anxiety. 60 tablet 1  . ALPRAZolam (XANAX) 0.5 MG tablet Take 1 tablet by mouth twice daily as needed for anxiety 60 tablet 3  . aspirin 81 MG tablet Take 81  mg by mouth daily.    . cholecalciferol (VITAMIN D) 1000 UNITS tablet Take 1,000 Units by mouth daily.    . COD LIVER OIL PO Take 1 capsule by mouth daily.     Marland Kitchen EPINEPHrine 0.3 mg/0.3 mL IJ SOAJ injection Inject 0.3 mLs (0.3 mg total) into the muscle as needed for anaphylaxis. 1 each 5  . escitalopram (LEXAPRO) 10 MG tablet Take 1 tablet (10 mg total) by mouth daily. 30 tablet 5  . famotidine (PEPCID)  20 MG tablet Take 20 mg by mouth 2 (two) times daily.    . hydrOXYzine (VISTARIL) 25 MG capsule TAKE 1 TO 2 CAPSULES BY MOUTH EVERY DAY AT BEDTIME AS NEEDED FOR ITCHING (MAY CAUSE DROWSINESS)    . mupirocin ointment (BACTROBAN) 2 % Apply to infected leg lesion twice a day 22 g 0  . olmesartan-hydrochlorothiazide (BENICAR HCT) 20-12.5 MG tablet Take 1 tablet by mouth daily. 90 tablet 1  . rosuvastatin (CRESTOR) 20 MG tablet Take 0.5 tablets (10 mg total) by mouth daily. Take 1/2 tablet 90 tablet 1  . vitamin B-12 (CYANOCOBALAMIN) 100 MCG tablet Take 50 mcg by mouth daily.    . vitamin E 100 UNIT capsule Take 100 Units by mouth daily.     No current facility-administered medications for this visit.   Allergies: Allergies  Allergen Reactions  . Shellfish Allergy Swelling    Swelling of lips   I reviewed her past medical history, social history, family history, and environmental history and no significant changes have been reported from previous visit on 03/31/2019.  Review of Systems  Constitutional: Negative.   HENT: Negative.   Eyes: Negative.   Respiratory: Negative.   Cardiovascular: Negative.   Gastrointestinal: Negative.   Musculoskeletal: Negative.   Skin: Negative.   Neurological: Negative.    Objective: Physical Exam Not obtained as encounter was done via telephone.   Previous notes and tests were reviewed.  I discussed the assessment and treatment plan with the patient. The patient was provided an opportunity to ask questions and all were answered. The patient agreed with the plan and demonstrated an understanding of the instructions.   The patient was advised to call back or seek an in-person evaluation if the symptoms worsen or if the condition fails to improve as anticipated.  I provided 23 minutes of non-face-to-face time during this encounter.  It was my pleasure to participate in Joanna Ray's care today. Please feel free to contact me with any questions or  concerns.   Sincerely,  Emoni Yang Charmian Muff, MD

## 2019-06-29 NOTE — Patient Instructions (Addendum)
Swelling and Hives Anaphylaxis due to food  - at this time etiology of hives and swelling is idiopathic or spontaneous.  Hives can be caused by a variety of different triggers including illness/infection, foods, medications, stings, exercise, pressure, vibrations, extremes of temperature to name a few however majority of the time there is no identifiable trigger.    - labwork was negative for tomato and shellfish allergy.   Recommend performing a shrimp challenge in the office sometime this summer or afterwards once you are hopefully able to come off of all antihistamines.    - labwork was reassuring however did show sensitivity to dog dander and molds.  Continue avoidance measures for dog dander and molds.   - for management of hives/swelling recommend the following high-dose antihistamine regimen: Continue Allegra 1 tablet daily with Pepcid 1 tablet daily. In April 2021 if you are still hive and swelling free then stop the Pepcid and only take Allegra daily. In May 2021 if you still remain hive and swelling free then stop the Allegra.    - Follow emergency action plan in case of future reaction which has been previously provided and have access to your Epipen  Follow-up 3-4 months or sooner if needed

## 2019-07-05 DIAGNOSIS — Z23 Encounter for immunization: Secondary | ICD-10-CM | POA: Diagnosis not present

## 2019-07-09 ENCOUNTER — Other Ambulatory Visit: Payer: Self-pay | Admitting: Internal Medicine

## 2019-07-09 ENCOUNTER — Encounter (HOSPITAL_COMMUNITY): Payer: Self-pay

## 2019-07-09 ENCOUNTER — Other Ambulatory Visit: Payer: Self-pay

## 2019-07-09 ENCOUNTER — Emergency Department (HOSPITAL_COMMUNITY): Payer: Medicare Other

## 2019-07-09 ENCOUNTER — Emergency Department (HOSPITAL_COMMUNITY)
Admission: EM | Admit: 2019-07-09 | Discharge: 2019-07-09 | Disposition: A | Discharge status: Home / Self Care | Payer: Medicare Other | Attending: Emergency Medicine | Admitting: Emergency Medicine

## 2019-07-09 DIAGNOSIS — Z7982 Long term (current) use of aspirin: Secondary | ICD-10-CM | POA: Diagnosis not present

## 2019-07-09 DIAGNOSIS — Z79899 Other long term (current) drug therapy: Secondary | ICD-10-CM | POA: Insufficient documentation

## 2019-07-09 DIAGNOSIS — R079 Chest pain, unspecified: Secondary | ICD-10-CM | POA: Diagnosis not present

## 2019-07-09 DIAGNOSIS — Z87891 Personal history of nicotine dependence: Secondary | ICD-10-CM | POA: Insufficient documentation

## 2019-07-09 DIAGNOSIS — R110 Nausea: Secondary | ICD-10-CM | POA: Insufficient documentation

## 2019-07-09 DIAGNOSIS — I10 Essential (primary) hypertension: Secondary | ICD-10-CM | POA: Diagnosis not present

## 2019-07-09 LAB — BASIC METABOLIC PANEL
Anion gap: 8 (ref 5–15)
BUN: 19 mg/dL (ref 8–23)
CO2: 28 mmol/L (ref 22–32)
Calcium: 9.6 mg/dL (ref 8.9–10.3)
Chloride: 103 mmol/L (ref 98–111)
Creatinine, Ser: 1.05 mg/dL — ABNORMAL HIGH (ref 0.44–1.00)
GFR calc Af Amer: >60 mL/min (ref >60)
GFR calc non Af Amer: 55 mL/min — ABNORMAL LOW (ref >60)
Glucose, Bld: 125 mg/dL — ABNORMAL HIGH (ref 70–99)
Potassium: 4.1 mmol/L (ref 3.5–5.1)
Sodium: 139 mmol/L (ref 135–145)

## 2019-07-09 LAB — CBC
HCT: 39 % (ref 36.0–46.0)
Hemoglobin: 12.5 g/dL (ref 12.0–15.0)
MCH: 30.8 pg (ref 26.0–34.0)
MCHC: 32.1 g/dL (ref 30.0–36.0)
MCV: 96.1 fL (ref 80.0–100.0)
Platelets: 198 10*3/uL (ref 150–400)
RBC: 4.06 MIL/uL (ref 3.87–5.11)
RDW: 13.4 % (ref 11.5–15.5)
WBC: 6.6 10*3/uL (ref 4.0–10.5)
nRBC: 0 % (ref 0.0–0.2)

## 2019-07-09 LAB — TROPONIN I (HIGH SENSITIVITY)
Troponin I (High Sensitivity): 3 ng/L (ref <18)
Troponin I (High Sensitivity): 4 ng/L (ref <18)

## 2019-07-09 MED ORDER — ASPIRIN 81 MG PO CHEW
324.0000 mg | CHEWABLE_TABLET | Freq: Once | ORAL | Status: AC
  Administered 2019-07-09: 324 mg via ORAL
  Filled 2019-07-09: qty 4

## 2019-07-09 MED ORDER — SODIUM CHLORIDE 0.9% FLUSH
3.0000 mL | Freq: Once | INTRAVENOUS | Status: AC
  Administered 2019-07-09: 02:00:00 3 mL via INTRAVENOUS

## 2019-07-09 NOTE — ED Notes (Signed)
Pt verbalized understanding of d/c instructions, follow up care and s/s requiring return to ED. Pt had no further questions. PT wheelchair out to waiting POV

## 2019-07-09 NOTE — ED Provider Notes (Signed)
TIME SEEN: 1:46 AM  CHIEF COMPLAINT: Chest pain  HPI: Patient is a 69 year old female with history of obesity, hypertension, hyperlipidemia, previous tobacco use who presents to the emergency department with central chest pain that started at 11:30 PM tonight while at rest that she describes as feeling like "indigestion".  She describes it as a pressure without radiation.  Does have associated nausea without vomiting.  No diaphoresis or dizziness.  Did take aspirin at home as well as Pepcid.  Reports pain has now completely resolved.  No other known aggravating or alleviating factors.  No cardiac history.  Does not have a cardiologist.  No lower extremity      Or pain.  No fever or cough.  ROS: See HPI Constitutional: no fever  Eyes: no drainage  ENT: no runny nose   Cardiovascular:   chest pain  Resp:  SOB  GI: no vomiting GU: no dysuria Integumentary: no rash  Allergy: no hives  Musculoskeletal: no leg swelling  Neurological: no slurred speech ROS otherwise negative  PAST MEDICAL HISTORY/PAST SURGICAL HISTORY:  Past Medical History:  Diagnosis Date  . Anxiety   . GERD (gastroesophageal reflux disease)   . Hyperlipidemia   . Hypertension     MEDICATIONS:  Prior to Admission medications   Medication Sig Start Date End Date Taking? Authorizing Provider  ALPRAZolam Duanne Moron) 0.5 MG tablet Take 1 tablet (0.5 mg total) by mouth 2 (two) times daily as needed for anxiety. 09/24/18   [name], MD  ALPRAZolam Duanne Moron) 0.5 MG tablet Take 1 tablet by mouth twice daily as needed for anxiety 05/11/19   [name], MD  aspirin 81 MG tablet Take 81 mg by mouth daily.    [provider]  cholecalciferol (VITAMIN D) 1000 UNITS tablet Take 1,000 Units by mouth daily.    [provider]  COD LIVER OIL PO Take 1 capsule by mouth daily.     [provider]  EPINEPHrine 0.3 mg/0.3 mL IJ SOAJ injection Inject 0.3 mLs (0.3 mg total) into the muscle as needed for  anaphylaxis. 12/09/18   [name], MD  escitalopram (LEXAPRO) 10 MG tablet Take 1 tablet (10 mg total) by mouth daily. 09/02/18   [name], MD  famotidine (PEPCID) 20 MG tablet Take 1 tablet (20 mg total) by mouth 2 (two) times daily. 06/29/19   Kennith Gain, MD  fexofenadine (ALLEGRA) 180 MG tablet Take 1 tablet (180 mg total) by mouth daily. 06/29/19   Kennith Gain, MD  hydrOXYzine (VISTARIL) 25 MG capsule TAKE 1 TO 2 CAPSULES BY MOUTH EVERY DAY AT BEDTIME AS NEEDED FOR ITCHING (MAY CAUSE DROWSINESS) 12/15/18   [provider]  mupirocin ointment (BACTROBAN) 2 % Apply to infected leg lesion twice a day 11/21/18   [name], MD  olmesartan-hydrochlorothiazide (BENICAR HCT) 20-12.5 MG tablet Take 1 tablet by mouth daily. 04/05/19   [name], MD  rosuvastatin (CRESTOR) 20 MG tablet Take 0.5 tablets (10 mg total) by mouth daily. Take 1/2 tablet 09/02/18   [name], MD  vitamin B-12 (CYANOCOBALAMIN) 100 MCG tablet Take 50 mcg by mouth daily.    [provider]  vitamin E 100 UNIT capsule Take 100 Units by mouth daily.    [provider]    ALLERGIES:  Allergies  Allergen Reactions  . Shellfish Allergy Swelling    Swelling of lips    SOCIAL HISTORY:  Social History   Tobacco Use  .  Smoking status: Former Research scientist (life sciences)  . Smokeless tobacco: Never Used  Substance Use Topics  . Alcohol use: No    FAMILY HISTORY: Family History  Problem Relation Age of Onset  . Stroke Mother   . Stroke Father   . Diabetes Sister   . Colon cancer Neg Hx   . Colon polyps Neg Hx   . Esophageal cancer Neg Hx   . Rectal cancer Neg Hx   . Stomach cancer Neg Hx     EXAM: BP (!) 173/80 (BP Location: Right Arm)   Pulse 68   Temp 98.3 F (36.8 C) (Oral)   Resp 18   Ht 5\' 5"  (1.651 m)   Wt 89.9 kg   SpO2 100%   BMI 32.98 kg/m  CONSTITUTIONAL: Alert and oriented and responds appropriately to questions. Well-appearing; well-nourished HEAD:  Normocephalic EYES: Conjunctivae clear, pupils appear equal, EOM appear intact ENT: normal nose; moist mucous membranes NECK: Supple, normal ROM CARD: RRR; S1 and S2 appreciated; no murmurs, no clicks, no rubs, no gallops RESP: Normal chest excursion without splinting or tachypnea; breath sounds clear and equal bilaterally; no wheezes, no rhonchi, no rales, no hypoxia or respiratory distress, speaking full sentences ABD/GI: Normal bowel sounds; non-distended; soft, non-tender, no rebound, no guarding, no peritoneal signs, no hepatosplenomegaly BACK:  The back appears normal EXT: Normal ROM in all joints; no deformity noted, no edema; no cyanosis, no calf tenderness or calf swelling SKIN: Normal color for age and race; warm; no rash on exposed skin NEURO: Moves all extremities equally PSYCH: The patient's mood and manner are appropriate.   MEDICAL DECISION MAKING: Patient here with chest pain.  Initial EKG shows abnormal ST segments in the anterior leads versus artifact.  Third EKG shows that this has resolved but at this time patient is pain-free.  Will discuss with cardiology on-call to review EKGs.  Patient currently pain-free.  Will obtain cardiac labs.  ED PROGRESS: 1:45 AM  Spoke with Dr. Einar Gip with cardiology.  Appreciate his help.  He agrees that this appears to be artifactual in nature.  Recommends obtaining 2 high-sensitivity troponins and if this is negative, he will see the patient in his office tomorrow.  Patient also comfortable with this plan.  Doubt PE, dissection given patient pain-free.  5:30 AM  Pt's labs are unremarkable including 2 normal troponins.  Still pain-free.  Patient comfortable with plan to follow-up with cardiology tomorrow morning.  Discussed return precautions.  She is comfortable with this plan.   At this time, I do not feel there is any life-threatening condition present. I have reviewed, interpreted and discussed all results (EKG, imaging, lab, urine as  appropriate) and exam findings with patient/family. I have reviewed nursing notes and appropriate previous records.  I feel the patient is safe to be discharged home without further emergent workup and can continue workup as an outpatient as needed. Discussed usual and customary return precautions. Patient/family verbalize understanding and are comfortable with this plan.  Outpatient follow-up has been provided as needed. All questions have been answered.      EKG Interpretation  Date/Time:  Sunday July 09 2019 01:09:27 EDT Ventricular Rate:  68 PR Interval:  152 QRS Duration: 88 QT Interval:  416 QTC Calculation: 442 R Axis:     Text Interpretation: Normal sinus rhythm Nonspecific ST and T wave abnormality Abnormal ECG Confirmed by Pryor Curia [phone]) on 07/09/2019 5:31:22 AM        EKG Interpretation  Date/Time:  Sunday July 09 2019 01:14:45 EDT Ventricular Rate:  76 PR Interval:  144 QRS Duration: 82 QT Interval:  396 QTC Calculation: 445 R Axis:   58 Text Interpretation: Normal sinus rhythm Nonspecific ST abnormality Abnormal QRS-T angle, consider primary T wave abnormality Abnormal ECG No significant change since last tracing Confirmed by Pryor Curia [phone]) on 07/09/2019 5:33:27 AM       EKG Interpretation  Date/Time:  Sunday July 09 2019 01:29:50 EDT Ventricular Rate:  71 PR Interval:  144 QRS Duration: 95 QT Interval:  414 QTC Calculation: 450 R Axis:   0 Text Interpretation: Sinus arrhythmia No significant change since last tracing Confirmed by Pryor Curia [phone]) on 07/09/2019 5:35:33 AM           [name] was evaluated in Emergency Department on 07/09/2019 for the symptoms described in the history of present illness. She was evaluated in the context of the global COVID-19 pandemic, which necessitated consideration that the patient might be at risk for infection with the SARS-CoV-2 virus that causes COVID-19. Institutional protocols and  algorithms that pertain to the evaluation of patients at risk for COVID-19 are in a state of rapid change based on information released by regulatory bodies including the CDC and federal and state organizations. These policies and algorithms were followed during the patient's care in the ED.  Patient was seen wearing N95, face shield, gloves.    Philipe Laswell, Delice Bison, DO 07/09/19 [phone]

## 2019-07-09 NOTE — ED Notes (Signed)
Pt stated they took Aspirin PTA.

## 2019-07-09 NOTE — ED Triage Notes (Signed)
Pt came in today c/o of CP. Pt sates it started around 2hrs ago. Pt complained of L-Sided CP that radiated to her back.

## 2019-07-09 NOTE — Discharge Instructions (Signed)
Your cardiac labs and chest x-ray are reassuring today.  Cardiologist has reviewed your EKGs and recommend close follow-up Monday morning.

## 2019-07-09 NOTE — ED Notes (Signed)
PAGED DR Rhae Hammock TO DR WARD--Meris Reede

## 2019-07-10 ENCOUNTER — Ambulatory Visit: Payer: Medicare Other | Admitting: Cardiology

## 2019-07-10 ENCOUNTER — Ambulatory Visit: Payer: Self-pay | Admitting: Cardiology

## 2019-07-10 ENCOUNTER — Encounter: Payer: Self-pay | Admitting: Cardiology

## 2019-07-10 VITALS — BP 144/81 | HR 66 | Ht 65.0 in | Wt 200.0 lb

## 2019-07-10 DIAGNOSIS — I10 Essential (primary) hypertension: Secondary | ICD-10-CM

## 2019-07-10 DIAGNOSIS — R072 Precordial pain: Secondary | ICD-10-CM | POA: Diagnosis not present

## 2019-07-10 DIAGNOSIS — E782 Mixed hyperlipidemia: Secondary | ICD-10-CM | POA: Diagnosis not present

## 2019-07-10 DIAGNOSIS — R9431 Abnormal electrocardiogram [ECG] [EKG]: Secondary | ICD-10-CM

## 2019-07-10 DIAGNOSIS — Z87891 Personal history of nicotine dependence: Secondary | ICD-10-CM

## 2019-07-10 NOTE — Progress Notes (Signed)
REASON FOR CONSULT: Chest pain/hospital follow-up  Chief Complaint  Patient presents with  . Chest Pain    Hospital Follow up    REQUESTING PHYSICIAN:  [name], MD [address] [place],  [place] [address]  HPI  Joanna Ray is a 69 y.o. female who presents to the office with a chief complaint of " chest pain." Patient's past medical history and cardiac risk factors include: Hypertension, hyperlipidemia, postmenopausal female, advanced age, former smoker.  Chest pain: Patient states that her chest discomfort was noted approximately 3 days ago on Saturday.  She describes the chest discomfort as indigestion initially.  She took antacid medications initially without any significant improvement in chest pain.  Patient states that she is resting when she started noticing substernal chest pain, intensity of 2 out of 10, describes it as a pressure-like sensation.  Intermittent, nonradiating, not brought on by effort related activities, not relieved with rest, patient said the symptoms at times did improve with belching.  Patient was concerned that the discomfort was probably cardiac in etiology and took 4 baby aspirin's and felt better.  Patient states that she decided to go to [facility] ED for further evaluation.  Patient states that she was evaluated for several hours while he did test and EKG and was later discharged home she was today for further evaluation.  At the time of evaluation patient denies any chest pain.  Hypertension: Patient states that her blood pressures were initially 140 mmHg.  Her PCP is working with her in regards to changing her antihypertensive medications for better blood pressure management.  She states that now her systolic blood pressures are in the mid 158mmHg and DBP in 80 mmHg.  She tries to consume a low-salt diet.  Currently patient denies chest pain, shortness of breath at rest or effort related symptoms, lightheadedness, dizziness,  palpitations, orthopnea, paroxysmal nocturnal dyspnea, lower extremity swelling, near syncope, syncopal events, hematochezia, hemoptysis, hematemesis, melanotic stools, no symptoms of amaurosis fugax, motor or sensory symptoms or dysphasia in the last 6 months.   Denies prior history of coronary artery disease, myocardial infarction, congestive heart failure, deep venous thrombosis, pulmonary embolism, stroke, transient ischemic attack.  FUNCTIONAL STATUS: Walks in a park 3x per week for 30 minutes in duration.    ALLERGIES: Allergies  Allergen Reactions  . Shellfish Allergy Swelling    Swelling of lips    MEDICATION LIST PRIOR TO VISIT: Current Outpatient Medications on File Prior to Visit  Medication Sig Dispense Refill  . ALPRAZolam (XANAX) 0.5 MG tablet Take 1 tablet (0.5 mg total) by mouth 2 (two) times daily as needed for anxiety. 60 tablet 1  . aspirin 81 MG tablet Take 81 mg by mouth daily.    . cholecalciferol (VITAMIN D) 1000 UNITS tablet Take 1,000 Units by mouth daily.    . COD LIVER OIL PO Take 1 capsule by mouth daily.     Marland Kitchen EPINEPHrine 0.3 mg/0.3 mL IJ SOAJ injection Inject 0.3 mLs (0.3 mg total) into the muscle as needed for anaphylaxis. 1 each 5  . escitalopram (LEXAPRO) 10 MG tablet Take 1 tablet (10 mg total) by mouth daily. (Patient taking differently: Take 10 mg by mouth daily as needed (anxiety / depression). ) 30 tablet 5  . famotidine (PEPCID) 20 MG tablet Take 1 tablet (20 mg total) by mouth 2 (two) times daily. 180 tablet 1  . fexofenadine (ALLEGRA) 180 MG tablet Take 1 tablet (180 mg total) by mouth daily. [place]  tablet 1  . hydrOXYzine (VISTARIL) 25 MG capsule Take 25-50 mg by mouth at bedtime as needed for itching.     . olmesartan-hydrochlorothiazide (BENICAR HCT) 20-12.5 MG tablet Take 1 tablet by mouth once daily 90 tablet 2  . rosuvastatin (CRESTOR) 20 MG tablet Take 0.5 tablets (10 mg total) by mouth daily. Take 1/2 tablet 90 tablet 1  . vitamin E 100 UNIT  capsule Take 100 Units by mouth daily.     No current facility-administered medications on file prior to visit.    PAST MEDICAL HISTORY: Past Medical History:  Diagnosis Date  . Anxiety   . GERD (gastroesophageal reflux disease)   . Hyperlipidemia   . Hypertension     PAST SURGICAL HISTORY: Past Surgical History:  Procedure Laterality Date  . CATARACT EXTRACTION, BILATERAL Bilateral   . DILATION AND CURETTAGE OF UTERUS      FAMILY HISTORY: The patient family history includes Breast cancer in her sister; Diabetes in her sister; Stroke in her father and mother.   SOCIAL HISTORY:  The patient  reports that she has quit smoking. She has never used smokeless tobacco. She reports that she does not drink alcohol or use drugs. dd 14 ORGAN REVIEW OF SYSTEMS: CONSTITUTIONAL: No fever or significant weight loss EYES: No recent significant visual change EARS, NOSE, MOUTH, THROAT: No recent significant change in hearing CARDIOVASCULAR: See discussion in subjective/HPI RESPIRATORY: See discussion in subjective/HPI GASTROINTESTINAL: No recent complaints of abdominal pain GENITOURINARY: No recent significant change in genitourinary status MUSCULOSKELETAL: No recent significant change in musculoskeletal status INTEGUMENTARY: No recent rash NEUROLOGIC: No recent significant change in motor function PSYCHIATRIC: No recent significant change in mood ENDOCRINOLOGIC: No recent significant change in endocrine status HEMATOLOGIC/LYMPHATIC: No recent significant unexpected bruising ALLERGIC/IMMUNOLOGIC: No recent unexplained allergic reaction  PHYSICAL EXAM: Vitals with BMI 07/10/2019 07/09/2019 07/09/2019  Height 5\' 5"  - -  Weight 200 lbs - -  BMI Q000111Q - -  Systolic 123456 [id number]  Diastolic 81 76 82  Pulse 66 62 68   CONSTITUTIONAL: Well-developed and well-nourished. No acute distress.  SKIN: Skin is warm and dry. No rash noted. No cyanosis. No pallor. No jaundice HEAD: Normocephalic and  atraumatic.  EYES: No scleral icterus  MOUTH/THROAT: Moist oral membranes.  NECK: No JVD present. No thyromegaly noted. No carotid bruits  LYMPHATIC: No visible cervical adenopathy.  CHEST Normal respiratory effort. No intercostal retractions  LUNGS: Clear to auscultation bilaterally.  No stridor. No wheezes. No rales.  CARDIOVASCULAR: Regular rate and rhythm, positive S1-S2, no murmurs rubs or gallops appreciated. ABDOMINAL: Soft, nontender, nondistended, positive bowel sounds all 4 quadrants.  No apparent ascites.  EXTREMITIES: No peripheral edema  HEMATOLOGIC: No significant bruising NEUROLOGIC: Oriented to person, place, and time. Nonfocal. Normal muscle tone.  PSYCHIATRIC: Normal mood and affect. Normal behavior. Cooperative  CARDIAC DATABASE: EKG: 07/10/2019: Normal sinus rhythm, poor R wave progression, without underlying ischemia or injury pattern.   Echocardiogram: None  Stress Testing:  None  Heart Catheterization: None  LABORATORY DATA: CBC Latest Ref Rng & Units 07/09/2019 12/28/2018 08/26/2018  WBC 4.0 - 10.5 K/uL 6.6 6.1 4.3  Hemoglobin 12.0 - 15.0 g/dL 12.5 12.5 11.8  Hematocrit 36.0 - 46.0 % 39.0 37.9 35.4  Platelets 150 - 400 K/uL 198 - 275    CMP Latest Ref Rng & Units 07/09/2019 01/09/2019 12/28/2018  Glucose 70 - 99 mg/dL 125(H) 105(H) 92  BUN 8 - 23 mg/dL 19 13 15   Creatinine 0.44 - 1.00 mg/dL 1.05(H)  1.09(H) 1.23(H)  Sodium 135 - 145 mmol/L 139 140 145(H)  Potassium 3.5 - 5.1 mmol/L 4.1 4.6 4.1  Chloride 98 - 111 mmol/L 103 103 104  CO2 22 - 32 mmol/L 28 29 25   Calcium 8.9 - 10.3 mg/dL 9.6 9.5 9.7  Total Protein 6.0 - 8.5 g/dL - - 7.7  Total Bilirubin 0.0 - 1.2 mg/dL - - 0.8  Alkaline Phos 39 - 117 IU/L - - 54  AST 0 - 40 IU/L - - 16  ALT 0 - 32 IU/L - - 12    Lipid Panel     Component Value Date/Time   CHOL 172 08/26/2018 0929   TRIG 103 08/26/2018 0929   HDL 44 (L) 08/26/2018 0929   CHOLHDL 3.9 08/26/2018 0929   VLDL 21 07/31/2016 1009    LDLCALC 108 (H) 08/26/2018 0929    Lab Results  Component Value Date   HGBA1C 5.8 (H) 11/30/2013   HGBA1C 5.6 09/26/2011   No components found for: NTPROBNP Lab Results  Component Value Date   TSH 1.99 08/26/2018   TSH 1.59 08/17/2017   TSH 0.98 07/31/2016    FINAL MEDICATION LIST END OF ENCOUNTER: No orders of the defined types were placed in this encounter.   There are no discontinued medications.   Current Outpatient Medications:  .  ALPRAZolam (XANAX) 0.5 MG tablet, Take 1 tablet (0.5 mg total) by mouth 2 (two) times daily as needed for anxiety., Disp: 60 tablet, Rfl: 1 .  aspirin 81 MG tablet, Take 81 mg by mouth daily., Disp: , Rfl:  .  cholecalciferol (VITAMIN D) 1000 UNITS tablet, Take 1,000 Units by mouth daily., Disp: , Rfl:  .  COD LIVER OIL PO, Take 1 capsule by mouth daily. , Disp: , Rfl:  .  EPINEPHrine 0.3 mg/0.3 mL IJ SOAJ injection, Inject 0.3 mLs (0.3 mg total) into the muscle as needed for anaphylaxis., Disp: 1 each, Rfl: 5 .  escitalopram (LEXAPRO) 10 MG tablet, Take 1 tablet (10 mg total) by mouth daily. (Patient taking differently: Take 10 mg by mouth daily as needed (anxiety / depression). ), Disp: 30 tablet, Rfl: 5 .  famotidine (PEPCID) 20 MG tablet, Take 1 tablet (20 mg total) by mouth 2 (two) times daily., Disp: 180 tablet, Rfl: 1 .  fexofenadine (ALLEGRA) 180 MG tablet, Take 1 tablet (180 mg total) by mouth daily., Disp: 90 tablet, Rfl: 1 .  hydrOXYzine (VISTARIL) 25 MG capsule, Take 25-50 mg by mouth at bedtime as needed for itching. , Disp: , Rfl:  .  olmesartan-hydrochlorothiazide (BENICAR HCT) 20-12.5 MG tablet, Take 1 tablet by mouth once daily, Disp: 90 tablet, Rfl: 2 .  rosuvastatin (CRESTOR) 20 MG tablet, Take 0.5 tablets (10 mg total) by mouth daily. Take 1/2 tablet, Disp: 90 tablet, Rfl: 1 .  vitamin E 100 UNIT capsule, Take 100 Units by mouth daily., Disp: , Rfl:   IMPRESSION:    ICD-10-CM   1. Precordial chest pain  R07.2 EKG 12-Lead     PCV ECHOCARDIOGRAM COMPLETE    Lipid Profile    Hemoglobin A1c  2. Nonspecific abnormal electrocardiogram (ECG) (EKG)  R94.31 PCV MYOCARDIAL PERFUSION WITH LEXISCAN  3. Benign hypertension  I10   4. Mixed hyperlipidemia  E78.2 Lipid Profile    Hemoglobin A1c  5. Former smoker  Z87.891      RECOMMENDATIONS: Joanna Ray is a 69 y.o. female whose past medical history and cardiac risk factors include: Hypertension, hyperlipidemia, postmenopausal female, advanced age,  former smoker. Precordial chest pain:  Patient symptoms of chest discomfort appears to be atypical in nature; however, due to multiple cardiovascular risk factors as noted above would recommend an ischemic evaluation.  Plan echocardiogram to evaluate LV function and regional wall motion abnormalities.  Lexiscan to rule out reversible ischemia.  Educated importance of primary prevention for cardiovascular disease: Check fasting lipid profile and hemoglobin A1c  Benign essential hypertension: . Medication reconciled.  Currently managed by primary team. . Patient is asked to keep a log of both blood pressure and pulse so that medications can be titrated based on a blood pressure trend as opposed to isolated blood pressure readings in the office. . If the blood pressure is consistently greater than 166mmHg patient is asked to call the office to for medication titration sooner than the next office visit.  . Low salt diet recommended. A diet that is rich in fruits, vegetables, legumes, and low-fat dairy products and low in snacks, sweets, and meats (such as the Dietary Approaches to Stop Hypertension [DASH] diet).   Mixed hyperlipidemia: . Continue statin therapy.  Patient's estimated 10-year risk of ASCVD is approximately 12% based on information present. . Check fasting lipid profile. . Patient denies myalgia or other side effects. . Most recent lipid profile reviewed with the patient. Most recent AST and ALT values  reviewed with the patient  Orders Placed This Encounter  Procedures  . Lipid Profile  . Hemoglobin A1c  . PCV MYOCARDIAL PERFUSION WITH LEXISCAN  . EKG 12-Lead  . PCV ECHOCARDIOGRAM COMPLETE   --Continue cardiac medications as reconciled in final medication list. --Return in about 6 weeks (around 08/21/2019) for Discussion of test results. Or sooner if needed. --Continue follow-up with your primary care physician regarding the management of your other chronic comorbid conditions.  Patient's questions and concerns were addressed to her satisfaction. She voices understanding of the instructions provided during this encounter.   This note was created using a voice recognition software as a result there may be grammatical errors inadvertently enclosed that do not reflect the nature of this encounter. Every attempt is made to correct such errors.  Rex Kras, DO, [facility]. [place] Office: [phone]

## 2019-07-10 NOTE — Patient Instructions (Addendum)
Please remember to bring in your medication bottles in at the next visit.   Office will call you to have the following tests scheduled:  Echo  Stress Test   Please get labs done  at the nearest [place].  Please keep a blood pressure log and bring that in at  your next office visit. Call the office if the top number is consistently greater than 194mmHg.   Recommend follow up with your PCP as scheduled.

## 2019-07-11 ENCOUNTER — Other Ambulatory Visit: Payer: Self-pay | Admitting: Cardiology

## 2019-07-11 DIAGNOSIS — R7309 Other abnormal glucose: Secondary | ICD-10-CM | POA: Diagnosis not present

## 2019-07-11 DIAGNOSIS — Z833 Family history of diabetes mellitus: Secondary | ICD-10-CM | POA: Diagnosis not present

## 2019-07-11 DIAGNOSIS — E782 Mixed hyperlipidemia: Secondary | ICD-10-CM | POA: Diagnosis not present

## 2019-07-11 DIAGNOSIS — R072 Precordial pain: Secondary | ICD-10-CM | POA: Diagnosis not present

## 2019-07-12 LAB — LIPID PANEL
Chol/HDL Ratio: 3.2 ratio (ref 0.0–4.4)
Cholesterol, Total: 157 mg/dL (ref 100–199)
HDL: 49 mg/dL (ref >39)
LDL Chol Calc (NIH): 90 mg/dL (ref 0–99)
Triglycerides: 100 mg/dL (ref 0–149)
VLDL Cholesterol Cal: 18 mg/dL (ref 5–40)

## 2019-07-12 LAB — HEMOGLOBIN A1C
Est. average glucose Bld gHb Est-mCnc: 114 mg/dL
Hgb A1c MFr Bld: 5.6 % (ref 4.8–5.6)

## 2019-07-17 ENCOUNTER — Other Ambulatory Visit: Payer: Self-pay | Admitting: Cardiology

## 2019-07-17 DIAGNOSIS — E782 Mixed hyperlipidemia: Secondary | ICD-10-CM

## 2019-07-17 DIAGNOSIS — R072 Precordial pain: Secondary | ICD-10-CM

## 2019-07-19 ENCOUNTER — Other Ambulatory Visit: Payer: Self-pay

## 2019-07-19 ENCOUNTER — Ambulatory Visit: Payer: Medicare Other

## 2019-07-19 DIAGNOSIS — R9431 Abnormal electrocardiogram [ECG] [EKG]: Secondary | ICD-10-CM | POA: Diagnosis not present

## 2019-07-31 ENCOUNTER — Other Ambulatory Visit: Payer: PRIVATE HEALTH INSURANCE

## 2019-08-10 ENCOUNTER — Ambulatory Visit: Payer: Medicare Other

## 2019-08-10 ENCOUNTER — Other Ambulatory Visit: Payer: Self-pay

## 2019-08-10 DIAGNOSIS — R072 Precordial pain: Secondary | ICD-10-CM | POA: Diagnosis not present

## 2019-08-16 ENCOUNTER — Ambulatory Visit: Payer: Medicare Other | Attending: Internal Medicine

## 2019-08-16 ENCOUNTER — Other Ambulatory Visit: Payer: Medicare Other

## 2019-08-16 DIAGNOSIS — Z20822 Contact with and (suspected) exposure to covid-19: Secondary | ICD-10-CM

## 2019-08-17 ENCOUNTER — Telehealth: Payer: Self-pay | Admitting: Internal Medicine

## 2019-08-17 LAB — SARS-COV-2, NAA 2 DAY TAT

## 2019-08-17 LAB — NOVEL CORONAVIRUS, NAA: SARS-CoV-2, NAA: NOT DETECTED

## 2019-08-17 NOTE — Telephone Encounter (Signed)
Patient is calling to receive her negative COVID test results. Patient expressed understanding. 

## 2019-08-21 ENCOUNTER — Ambulatory Visit: Payer: Medicare Other | Admitting: Cardiology

## 2019-08-21 ENCOUNTER — Other Ambulatory Visit: Payer: Self-pay

## 2019-08-21 ENCOUNTER — Encounter: Payer: Self-pay | Admitting: Cardiology

## 2019-08-21 VITALS — BP 142/80 | HR 64 | Temp 98.0°F | Resp 17 | Ht 65.0 in | Wt 202.0 lb

## 2019-08-21 DIAGNOSIS — Z87891 Personal history of nicotine dependence: Secondary | ICD-10-CM

## 2019-08-21 DIAGNOSIS — Z712 Person consulting for explanation of examination or test findings: Secondary | ICD-10-CM | POA: Diagnosis not present

## 2019-08-21 DIAGNOSIS — I10 Essential (primary) hypertension: Secondary | ICD-10-CM

## 2019-08-21 DIAGNOSIS — E782 Mixed hyperlipidemia: Secondary | ICD-10-CM | POA: Diagnosis not present

## 2019-08-21 NOTE — Progress Notes (Signed)
Joanna Ray Date of Birth: 04-24-1951 MRN: [id number] Primary Care Provider:Baxley, Cresenciano Lick, MD Primary Cardiologist: Rex Kras, DO, [facility] (established care 07/10/2019)  Date: 08/21/19 Last Office Visit: 07/10/2019  Chief Complaint  Patient presents with  . Chest Pain  . Follow-up    6 week  . Results   HPI  Joanna Ray is a 69 y.o. female who presents to the office with a chief complaint of " chest pain." Patient's past medical history and cardiac risk factors include: Hypertension, hyperlipidemia, postmenopausal female, advanced age, former smoker.  Patient was initially referred to the office at the request of her primary care provider for evaluation of chest pain and hospital follow-up.  Patient was seen in consultation on July 10, 2019.  At last office visit given the patient's symptoms of chest pain and cardiovascular risk factors she was recommended to undergo an echo and nuclear stress test.  Both of the results were reviewed with the patient in great detail and findings noted below for further reference.  Since last office visit patient states that her chest pain is completely resolved.  Her functional status remains stable.  She continues to walk in the park 3 times a week at least with her sister for at least 30 minutes in duration.  Patient does have hypertension which is currently managed by primary care physician.  She has an appointment with her PCP on Aug 29, 2019.  Medications reconciled.  Will defer further management to primary team.  Denies prior history of coronary artery disease, myocardial infarction, congestive heart failure, deep venous thrombosis, pulmonary embolism, stroke, transient ischemic attack.  FUNCTIONAL STATUS: Walks in a park 3x per week for 30 minutes in duration.    ALLERGIES: Allergies  Allergen Reactions  . Shellfish Allergy Swelling    Swelling of lips    MEDICATION LIST PRIOR TO VISIT: Current Outpatient Medications on  File Prior to Visit  Medication Sig Dispense Refill  . ALPRAZolam (XANAX) 0.5 MG tablet Take 1 tablet (0.5 mg total) by mouth 2 (two) times daily as needed for anxiety. 60 tablet 1  . aspirin 81 MG tablet Take 81 mg by mouth daily.    . cholecalciferol (VITAMIN D) 1000 UNITS tablet Take 1,000 Units by mouth daily.    . COD LIVER OIL PO Take 1 capsule by mouth daily.     Marland Kitchen EPINEPHrine 0.3 mg/0.3 mL IJ SOAJ injection Inject 0.3 mLs (0.3 mg total) into the muscle as needed for anaphylaxis. 1 each 5  . escitalopram (LEXAPRO) 10 MG tablet Take 1 tablet (10 mg total) by mouth daily. (Patient taking differently: Take 10 mg by mouth daily as needed (anxiety / depression). ) 30 tablet 5  . famotidine (PEPCID) 20 MG tablet Take 1 tablet (20 mg total) by mouth 2 (two) times daily. 180 tablet 1  . fexofenadine (ALLEGRA) 180 MG tablet Take 1 tablet (180 mg total) by mouth daily. 90 tablet 1  . hydrOXYzine (VISTARIL) 25 MG capsule Take 25-50 mg by mouth at bedtime as needed for itching.     . olmesartan-hydrochlorothiazide (BENICAR HCT) 20-12.5 MG tablet Take 1 tablet by mouth once daily 90 tablet 2  . rosuvastatin (CRESTOR) 20 MG tablet Take 0.5 tablets (10 mg total) by mouth daily. Take 1/2 tablet 90 tablet 1  . vitamin E 100 UNIT capsule Take 100 Units by mouth daily.     No current facility-administered medications on file prior to visit.    PAST MEDICAL HISTORY: Past  Medical History:  Diagnosis Date  . Anxiety   . GERD (gastroesophageal reflux disease)   . Hyperlipidemia   . Hypertension     PAST SURGICAL HISTORY: Past Surgical History:  Procedure Laterality Date  . CATARACT EXTRACTION, BILATERAL Bilateral   . DILATION AND CURETTAGE OF UTERUS      FAMILY HISTORY: The patient family history includes Breast cancer in her sister; Diabetes in her sister; Stroke in her father and mother.   SOCIAL HISTORY:  The patient  reports that she quit smoking about 41 years ago. Her smoking use included  cigarettes. She has a 5.00 pack-year smoking history. She has never used smokeless tobacco. She reports that she does not drink alcohol or use drugs.  Review of Systems  Constitution: Negative for chills and fever.  HENT: Negative for hoarse voice and nosebleeds.   Eyes: Negative for discharge, double vision and pain.  Cardiovascular: Negative for chest pain, claudication, dyspnea on exertion, leg swelling, near-syncope, orthopnea, palpitations, paroxysmal nocturnal dyspnea and syncope.  Respiratory: Negative for hemoptysis and shortness of breath.   Musculoskeletal: Negative for muscle cramps and myalgias.  Gastrointestinal: Negative for abdominal pain, constipation, diarrhea, hematemesis, hematochezia, melena, nausea and vomiting.  Neurological: Negative for dizziness and light-headedness.    PHYSICAL EXAM: Vitals with BMI 08/21/2019 07/10/2019 07/09/2019  Height 5\' 5"  5\' 5"  -  Weight 202 lbs 200 lbs -  BMI [id number] -  Systolic [id number]  Diastolic 80 81 76  Pulse 64 66 62   CONSTITUTIONAL: Well-developed and well-nourished. No acute distress.  SKIN: Skin is warm and dry. No rash noted. No cyanosis. No pallor. No jaundice HEAD: Normocephalic and atraumatic.  EYES: No scleral icterus  MOUTH/THROAT: Moist oral membranes.  NECK: No JVD present. No thyromegaly noted. No carotid bruits  LYMPHATIC: No visible cervical adenopathy.  CHEST Normal respiratory effort. No intercostal retractions  LUNGS: Clear to auscultation bilaterally.  No stridor. No wheezes. No rales.  CARDIOVASCULAR: Regular rate and rhythm, positive S1-S2, no murmurs rubs or gallops appreciated. ABDOMINAL: Soft, nontender, nondistended, positive bowel sounds all 4 quadrants.  No apparent ascites.  EXTREMITIES: No peripheral edema  HEMATOLOGIC: No significant bruising NEUROLOGIC: Oriented to person, place, and time. Nonfocal. Normal muscle tone.  PSYCHIATRIC: Normal mood and affect. Normal behavior.  Cooperative  CARDIAC DATABASE: EKG: 07/10/2019: Normal sinus rhythm, poor R wave progression, without underlying ischemia or injury pattern.   Echocardiogram: 08/10/2019: LVEF 55% mild concentric LVH, normal diastolic filling pattern, mildly dilated left atrium, mild AR, mild MR, mild TR.  Stress Testing:  Lexiscan (Walking with mod Bruce)Tetrofosmin Stress Test  07/22/2019: Myocardial perfusion is normal. Stress LV EF: 73%. Nondiagnostic ECG stress. Low risk.   Heart Catheterization: None  LABORATORY DATA: CBC Latest Ref Rng & Units 07/09/2019 12/28/2018 08/26/2018  WBC 4.0 - 10.5 K/uL 6.6 6.1 4.3  Hemoglobin 12.0 - 15.0 g/dL 12.5 12.5 11.8  Hematocrit 36.0 - 46.0 % 39.0 37.9 35.4  Platelets 150 - 400 K/uL 198 - 275    CMP Latest Ref Rng & Units 07/09/2019 01/09/2019 12/28/2018  Glucose 70 - 99 mg/dL 125(H) 105(H) 92  BUN 8 - 23 mg/dL 19 13 15   Creatinine 0.44 - 1.00 mg/dL 1.05(H) 1.09(H) 1.23(H)  Sodium 135 - 145 mmol/L 139 140 145(H)  Potassium 3.5 - 5.1 mmol/L 4.1 4.6 4.1  Chloride 98 - 111 mmol/L 103 103 104  CO2 22 - 32 mmol/L 28 29 25   Calcium 8.9 - 10.3 mg/dL 9.6 9.5 9.7  Total  Protein 6.0 - 8.5 g/dL - - 7.7  Total Bilirubin 0.0 - 1.2 mg/dL - - 0.8  Alkaline Phos 39 - 117 IU/L - - 54  AST 0 - 40 IU/L - - 16  ALT 0 - 32 IU/L - - 12    Lipid Panel     Component Value Date/Time   CHOL 157 07/11/2019 0925   TRIG 100 07/11/2019 0925   HDL 49 07/11/2019 0925   CHOLHDL 3.2 07/11/2019 0925   CHOLHDL 3.9 08/26/2018 0929   VLDL 21 07/31/2016 1009   LDLCALC 90 07/11/2019 0925   LDLCALC 108 (H) 08/26/2018 0929   LABVLDL 18 07/11/2019 0925    Lab Results  Component Value Date   HGBA1C 5.6 07/11/2019   HGBA1C 5.8 (H) 11/30/2013   HGBA1C 5.6 09/26/2011   No components found for: NTPROBNP Lab Results  Component Value Date   TSH 1.99 08/26/2018   TSH 1.59 08/17/2017   TSH 0.98 07/31/2016    FINAL MEDICATION LIST END OF ENCOUNTER: No orders of the defined types were  placed in this encounter.   There are no discontinued medications.   Current Outpatient Medications:  .  ALPRAZolam (XANAX) 0.5 MG tablet, Take 1 tablet (0.5 mg total) by mouth 2 (two) times daily as needed for anxiety., Disp: 60 tablet, Rfl: 1 .  aspirin 81 MG tablet, Take 81 mg by mouth daily., Disp: , Rfl:  .  cholecalciferol (VITAMIN D) 1000 UNITS tablet, Take 1,000 Units by mouth daily., Disp: , Rfl:  .  COD LIVER OIL PO, Take 1 capsule by mouth daily. , Disp: , Rfl:  .  EPINEPHrine 0.3 mg/0.3 mL IJ SOAJ injection, Inject 0.3 mLs (0.3 mg total) into the muscle as needed for anaphylaxis., Disp: 1 each, Rfl: 5 .  escitalopram (LEXAPRO) 10 MG tablet, Take 1 tablet (10 mg total) by mouth daily. (Patient taking differently: Take 10 mg by mouth daily as needed (anxiety / depression). ), Disp: 30 tablet, Rfl: 5 .  famotidine (PEPCID) 20 MG tablet, Take 1 tablet (20 mg total) by mouth 2 (two) times daily., Disp: 180 tablet, Rfl: 1 .  fexofenadine (ALLEGRA) 180 MG tablet, Take 1 tablet (180 mg total) by mouth daily., Disp: 90 tablet, Rfl: 1 .  hydrOXYzine (VISTARIL) 25 MG capsule, Take 25-50 mg by mouth at bedtime as needed for itching. , Disp: , Rfl:  .  olmesartan-hydrochlorothiazide (BENICAR HCT) 20-12.5 MG tablet, Take 1 tablet by mouth once daily, Disp: 90 tablet, Rfl: 2 .  rosuvastatin (CRESTOR) 20 MG tablet, Take 0.5 tablets (10 mg total) by mouth daily. Take 1/2 tablet, Disp: 90 tablet, Rfl: 1 .  vitamin E 100 UNIT capsule, Take 100 Units by mouth daily., Disp: , Rfl:   IMPRESSION:    ICD-10-CM   1. Encounter to discuss test results  Z71.2   2. Mixed hyperlipidemia  E78.2   3. Benign hypertension  I10   4. Former smoker  Z87.891      RECOMMENDATIONS: Joanna Ray is a 69 y.o. female whose past medical history and cardiac risk factors include: Hypertension, hyperlipidemia, postmenopausal female, advanced age, former smoker.  Precordial chest pain: resolved.   Echo and stress  test results reviewed with the patient.   Continue lifestyle changes to improve your modificable risk factors.   Hypertension followed by PCP.   Continue statin therapy.   Educated on the importance of lifestyle modifications to improve her modifiable cardiovascular risk factors.  Encouraged 30 minutes of exercise  5 days a week or as tolerated.  Patient is motivated and understands that her blood pressure and cholesterol will improve with such lifestyle modifications.  Benign essential hypertension: . Medication reconciled.  Currently managed by primary team. . Patient is asked to keep a log of her blood pressures and to take it in at her next office visit which is coming up on Aug 29, 2019.   . Low salt diet recommended. A diet that is rich in fruits, vegetables, legumes, and low-fat dairy products and low in snacks, sweets, and meats (such as the Dietary Approaches to Stop Hypertension [DASH] diet).   Mixed hyperlipidemia: . Most recent lipid profile reviewed with the patient.  Noted above for further reference. . Currently on Crestor 10 mg p.o. nightly.  . Patient would like to hold off on further medication titration and focus on lifestyle modifications at the given time.   . Patient denies myalgia or other side effects.  Reviewed the results of the most recent echo and nuclear stress test with the patient findings noted above for further reference.  Independently reviewed the images of the most recent stress test dated 07/22/2019.  --Continue cardiac medications as reconciled in final medication list. --Return in about 6 months (around 02/20/2020) for follow up 54month to re-eval symptoms and RF. Or sooner if needed. --Continue follow-up with your primary care physician regarding the management of your other chronic comorbid conditions.  Patient's questions and concerns were addressed to her satisfaction. She voices understanding of the instructions provided during this encounter.    This note was created using a voice recognition software as a result there may be grammatical errors inadvertently enclosed that do not reflect the nature of this encounter. Every attempt is made to correct such errors.  Rex Kras, DO, [facility]. [place] Office: [phone]

## 2019-08-24 ENCOUNTER — Telehealth: Payer: Self-pay | Admitting: Internal Medicine

## 2019-08-24 NOTE — Telephone Encounter (Signed)
Called and let patient know she should call and follow up with cardiologist, also let her know she should keep a log of her blood pressures so the doctor has something to go by.

## 2019-08-24 NOTE — Telephone Encounter (Signed)
Joanna Ray [phone]  [name] called to say she has been having high blood pressure, it has been running 139/80 or 144/83, it was high at her last appointment with cardiology it was 142/80, she has also been having some dizziness when standing. She takes her blood pressure a couple hours after taking medication and before she goes to bed. Her CPE is scheduled for 09/05/2019.

## 2019-08-24 NOTE — Telephone Encounter (Signed)
She needs to call the Cardiologist for advice

## 2019-08-29 ENCOUNTER — Other Ambulatory Visit: Payer: Medicare Other | Admitting: Internal Medicine

## 2019-08-29 ENCOUNTER — Other Ambulatory Visit: Payer: Self-pay

## 2019-08-29 DIAGNOSIS — Z1329 Encounter for screening for other suspected endocrine disorder: Secondary | ICD-10-CM | POA: Diagnosis not present

## 2019-08-29 DIAGNOSIS — E782 Mixed hyperlipidemia: Secondary | ICD-10-CM

## 2019-08-29 DIAGNOSIS — I10 Essential (primary) hypertension: Secondary | ICD-10-CM | POA: Diagnosis not present

## 2019-08-29 DIAGNOSIS — Z0000 Encounter for general adult medical examination without abnormal findings: Secondary | ICD-10-CM | POA: Diagnosis not present

## 2019-08-30 LAB — COMPLETE METABOLIC PANEL WITH GFR
AG Ratio: 1.4 (calc) (ref 1.0–2.5)
ALT: 11 U/L (ref 6–29)
AST: 15 U/L (ref 10–35)
Albumin: 4.2 g/dL (ref 3.6–5.1)
Alkaline phosphatase (APISO): 43 U/L (ref 37–153)
BUN/Creatinine Ratio: 19 (calc) (ref 6–22)
BUN: 20 mg/dL (ref 7–25)
CO2: 30 mmol/L (ref 20–32)
Calcium: 9.5 mg/dL (ref 8.6–10.4)
Chloride: 105 mmol/L (ref 98–110)
Creat: 1.03 mg/dL — ABNORMAL HIGH (ref 0.50–0.99)
GFR, Est African American: 65 mL/min/{1.73_m2} (ref >=60)
GFR, Est Non African American: 56 mL/min/{1.73_m2} — ABNORMAL LOW (ref >=60)
Globulin: 3.1 g/dL (calc) (ref 1.9–3.7)
Glucose, Bld: 93 mg/dL (ref 65–99)
Potassium: 4.4 mmol/L (ref 3.5–5.3)
Sodium: 142 mmol/L (ref 135–146)
Total Bilirubin: 0.9 mg/dL (ref 0.2–1.2)
Total Protein: 7.3 g/dL (ref 6.1–8.1)

## 2019-08-30 LAB — CBC WITH DIFFERENTIAL/PLATELET
Absolute Monocytes: 558 cells/uL (ref 200–950)
Basophils Absolute: 72 cells/uL (ref 0–200)
Basophils Relative: 1.6 %
Eosinophils Absolute: 122 cells/uL (ref 15–500)
Eosinophils Relative: 2.7 %
HCT: 37 % (ref 35.0–45.0)
Hemoglobin: 12.3 g/dL (ref 11.7–15.5)
Lymphs Abs: 1742 cells/uL (ref 850–3900)
MCH: 31.1 pg (ref 27.0–33.0)
MCHC: 33.2 g/dL (ref 32.0–36.0)
MCV: 93.7 fL (ref 80.0–100.0)
MPV: 11.5 fL (ref 7.5–12.5)
Monocytes Relative: 12.4 %
Neutro Abs: 2007 cells/uL (ref 1500–7800)
Neutrophils Relative %: 44.6 %
Platelets: 216 10*3/uL (ref 140–400)
RBC: 3.95 10*6/uL (ref 3.80–5.10)
RDW: 12.9 % (ref 11.0–15.0)
Total Lymphocyte: 38.7 %
WBC: 4.5 10*3/uL (ref 3.8–10.8)

## 2019-08-30 LAB — LIPID PANEL
Cholesterol: 173 mg/dL (ref <200)
HDL: 48 mg/dL — ABNORMAL LOW (ref >=50)
LDL Cholesterol (Calc): 106 mg/dL (calc) — ABNORMAL HIGH
Non-HDL Cholesterol (Calc): 125 mg/dL (calc) (ref <130)
Total CHOL/HDL Ratio: 3.6 (calc) (ref <5.0)
Triglycerides: 96 mg/dL (ref <150)

## 2019-08-30 LAB — TSH: TSH: 1.94 mIU/L (ref 0.40–4.50)

## 2019-09-05 ENCOUNTER — Ambulatory Visit (INDEPENDENT_AMBULATORY_CARE_PROVIDER_SITE_OTHER): Payer: Medicare Other | Admitting: Internal Medicine

## 2019-09-05 ENCOUNTER — Other Ambulatory Visit: Payer: Self-pay

## 2019-09-05 ENCOUNTER — Encounter: Payer: Self-pay | Admitting: Internal Medicine

## 2019-09-05 VITALS — BP 146/80 | HR 62 | Temp 98.3°F | Ht 65.0 in | Wt 200.0 lb

## 2019-09-05 DIAGNOSIS — I10 Essential (primary) hypertension: Secondary | ICD-10-CM

## 2019-09-05 DIAGNOSIS — E7800 Pure hypercholesterolemia, unspecified: Secondary | ICD-10-CM | POA: Diagnosis not present

## 2019-09-05 DIAGNOSIS — F419 Anxiety disorder, unspecified: Secondary | ICD-10-CM

## 2019-09-05 DIAGNOSIS — Z0000 Encounter for general adult medical examination without abnormal findings: Secondary | ICD-10-CM | POA: Diagnosis not present

## 2019-09-05 DIAGNOSIS — F32A Depression, unspecified: Secondary | ICD-10-CM

## 2019-09-05 DIAGNOSIS — E786 Lipoprotein deficiency: Secondary | ICD-10-CM

## 2019-09-05 DIAGNOSIS — R8290 Unspecified abnormal findings in urine: Secondary | ICD-10-CM | POA: Diagnosis not present

## 2019-09-05 DIAGNOSIS — F329 Major depressive disorder, single episode, unspecified: Secondary | ICD-10-CM

## 2019-09-05 LAB — POCT URINALYSIS DIPSTICK
Bilirubin, UA: NEGATIVE
Glucose, UA: NEGATIVE
Ketones, UA: NEGATIVE
Nitrite, UA: NEGATIVE
Protein, UA: NEGATIVE
Spec Grav, UA: 1.015 (ref 1.010–1.025)
Urobilinogen, UA: 0.2 E.U./dL
pH, UA: 6 (ref 5.0–8.0)

## 2019-09-05 NOTE — Patient Instructions (Addendum)
Increase Benicar to 40/25 (take 2 tablets of what you have now) daily and return in 2 weeks.  Be well-hydrated when you return.  You will need basic metabolic panel drawn at that time.  Blood pressure will be rechecked at that time. Order given for Shingrix.

## 2019-09-05 NOTE — Progress Notes (Signed)
Subjective:    Patient ID: Joanna Ray, female    DOB: 25-Nov-1950, 69 y.o.   MRN: [id number]  HPI  69 year old Female for health maintenance exam, Medicare wellness, and evaluation of medical issues.  History of essential hypertension and hyperlipidemia which are well controlled.  No history of operations or accidents.  No known drug allergies.  Recently had Echocardiogram and stress test after presenting to the emergency department with chest pain in March.  Evaluated by Dr. Terri Skains and Dr. Einar Gip.  Today, discussed results with patient.  Brings in multiple  BP readings ranging from [id number] systolically.  Will adjust blood pressure medication.  Had echocardiogram in April showing mild aortic regurgitation.  Explained to patient that this was of little consequence.  Ejection fraction 55%.  Had myocardial perfusion nuclear stress test which was normal.  She is on statin medication.  LDL is 106, HDL is low at 48 but total cholesterol and triglycerides are normal.  Currently on Crestor 10 mg daily.  History of anxiety and depression treated with Lexapro.  History of allergic rhinitis  History of urticaria treated by Dr. Nelva Bush.  History of anxiety treated with Xanax.  Social history: Husband is retired and has congestive heart failure.  Patient has not smoked for over 20 years.  No alcohol consumption.  She has adult children.  She teaches at [employer].  Family history: Father died at age 74 of a stroke.  Mother died at age 26 with history of stroke in 2010.  1 brother with history of hypertension and MI.  1 sister with history of diabetes.  1 sister age 52 with hypertension and another sister age 68 with hypertension.  Son with history of hypertension.    Review of Systems vaccines up to date. Hgb AIC in March was 5.6%     Objective:   Physical Exam Pulse 62 T 98.3 degrees, Weight 200 pounds BMI 33.28 Skin warm and dry.  Nodes none.  Neck is supple.   No thyromegaly.  No carotid bruits.  Chest clear to auscultation.  Cardiac exam regular rate and rhythm normal S1 and S2 without murmurs or gallops.  Abdomen soft nondistended without hepatosplenomegaly masses or tenderness.  Pap taken in 2018 and will not be repeated due to age.  Neuro intact without focal deficits.      Assessment & Plan:  Elevated and labile BP readings- Increase Benicar HCT 40/25 daily.  Follow-up on May 25  Anxiety treated sparingly with Xanax and also takes Lexapro for anxiety.  History of grief reaction- lost her son and accident in the Summer 2019.  Has had counseling.  History of benign positional vertigo  History of intertrigo treated with Nizoral cream  Hyperlipidemia treated with statin medication  History of urticaria treated by Dr. Nelva Bush  Urine dipstick is abnormal.  Shows moderate occult blood and small LE but culture proved to be negative.  Patient is asymptomatic.  Plan: Benicar HCT increased to 40/25 daily with follow-up May 25.  Be well-hydrated at the time when you return.  Order given for Shingrix vaccine.  Subjective:   Patient presents for Medicare Annual/Subsequent preventive examination.  Review Past Medical/Family/Social: See above   Risk Factors  Current exercise habits: Probably should get more exercise and this was discussed Dietary issues discussed: Low-fat low carbohydrate  Cardiac risk factors: Hyperlipidemia and hypertension  Depression Screen  (Note: if answer to either of the following is "Yes", a more complete depression screening is  indicated)   Over the past two weeks, have you felt down, depressed or hopeless? No  Over the past two weeks, have you felt little interest or pleasure in doing things? No Have you lost interest or pleasure in daily life? No Do you often feel hopeless? No Do you cry easily over simple problems? No   Activities of Daily Living  In your present state of health, do you have any difficulty  performing the following activities?:   Driving? No  Managing money? No  Feeding yourself? No  Getting from bed to chair? No  Climbing a flight of stairs? No  Preparing food and eating?: No  Bathing or showering? No  Getting dressed: No  Getting to the toilet? No  Using the toilet:No  Moving around from place to place: No  In the past year have you fallen or had a near fall?:No  Are you sexually active? Yes Do you have more than one partner? No   Hearing Difficulties: Do you often ask people to speak up or repeat themselves?  Yes Do you experience ringing or noises in your ears? No  Do you have difficulty understanding soft or whispered voices? No  Do you feel that you have a problem with memory?  Sometimes Do you often misplace items?  Sometimes   Home Safety:  Do you have a smoke alarm at your residence? Yes Do you have grab bars in the bathroom?  None Do you have throw rugs in your house?  None   Cognitive Testing  Alert? Yes Normal Appearance?Yes  Oriented to person? Yes Place? Yes  Time? Yes  Recall of three objects? Yes  Can perform simple calculations? Yes  Displays appropriate judgment?Yes  Can read the correct time from a watch face?Yes   List the Names of Other Physician/Practitioners you currently use:  See referral list for the physicians patient is currently seeing.  Cardiologist  Dr. Nelva Bush   Review of Systems: See above   Objective:     General appearance: Appears younger than stated age and mildly obese  Head: Normocephalic, without obvious abnormality, atraumatic  Eyes: conj clear, EOMi PEERLA  Ears: normal TM's and external ear canals both ears  Nose: Nares normal. Septum midline. Mucosa normal. No drainage or sinus tenderness.  Throat: lips, mucosa, and tongue normal; teeth and gums normal  Neck: no adenopathy, no carotid bruit, no JVD, supple, symmetrical, trachea midline and thyroid not enlarged, symmetric, no tenderness/mass/nodules    No CVA tenderness.  Lungs: clear to auscultation bilaterally  Breasts: normal appearance, no masses or tenderness Heart: regular rate and rhythm, S1, S2 normal, no murmur, click, rub or gallop  Abdomen: soft, non-tender; bowel sounds normal; no masses, no organomegaly  Musculoskeletal: ROM normal in all joints, no crepitus, no deformity, Normal muscle strengthen. Back  is symmetric, no curvature. Skin: Skin color, texture, turgor normal. No rashes or lesions  Lymph nodes: Cervical, supraclavicular, and axillary nodes normal.  Neurologic: CN 2 -12 Normal, Normal symmetric reflexes. Normal coordination and gait  Psych: Alert & Oriented x 3, Mood appear stable.    Assessment:    Annual wellness medicare exam   Plan:    During the course of the visit the patient was educated and counseled about appropriate screening and preventive services including:   Tetanus immunization up-to-date  Pneumococcal immunizations up-to-date  Has had to monitor no vaccines for COVID-19     Patient Instructions (the written plan) was given to the patient.  Medicare Attestation  I have personally reviewed:  The patient's medical and social history  Their use of alcohol, tobacco or illicit drugs  Their current medications and supplements  The patient's functional ability including ADLs,fall risks, home safety risks, cognitive, and hearing and visual impairment  Diet and physical activities  Evidence for depression or mood disorders  The patient's weight, height, BMI, and visual acuity have been recorded in the chart. I have made referrals, counseling, and provided education to the patient based on review of the above and I have provided the patient with a written personalized care plan for preventive services.

## 2019-09-06 LAB — URINE CULTURE
MICRO NUMBER:: 10464665
Result:: NO GROWTH
SPECIMEN QUALITY:: ADEQUATE

## 2019-09-19 ENCOUNTER — Ambulatory Visit (INDEPENDENT_AMBULATORY_CARE_PROVIDER_SITE_OTHER): Payer: Medicare Other | Admitting: Internal Medicine

## 2019-09-19 ENCOUNTER — Encounter: Payer: Self-pay | Admitting: Internal Medicine

## 2019-09-19 ENCOUNTER — Other Ambulatory Visit: Payer: Self-pay

## 2019-09-19 VITALS — BP 120/80 | HR 60 | Ht 65.0 in | Wt 197.0 lb

## 2019-09-19 DIAGNOSIS — I10 Essential (primary) hypertension: Secondary | ICD-10-CM

## 2019-09-19 LAB — BASIC METABOLIC PANEL
BUN/Creatinine Ratio: 15 (calc) (ref 6–22)
BUN: 17 mg/dL (ref 7–25)
CO2: 29 mmol/L (ref 20–32)
Calcium: 9.6 mg/dL (ref 8.6–10.4)
Chloride: 102 mmol/L (ref 98–110)
Creat: 1.13 mg/dL — ABNORMAL HIGH (ref 0.50–0.99)
Glucose, Bld: 96 mg/dL (ref 65–99)
Potassium: 4.1 mmol/L (ref 3.5–5.3)
Sodium: 140 mmol/L (ref 135–146)

## 2019-09-19 MED ORDER — OLMESARTAN MEDOXOMIL-HCTZ 40-25 MG PO TABS: 1.0000 | ORAL_TABLET | Freq: Every day | ORAL | 1 refills | Status: DC

## 2019-09-19 NOTE — Progress Notes (Signed)
   Subjective:    Patient ID: Joanna Ray, female    DOB: [birth year], 69 y.o.   MRN: [id number]  HPI 69 year old Female in today to follow-up on hypertension.  At last visit on May 11, we increased olmesartan/HCTZ from 20/12.5 to 40/25 daily.  She is now here for follow-up.  Brings in multiple blood pressure readings which are all very acceptable.  Systolic has ranged from 542 to 715 and diastolic has ranged from 64 to 80.  She feels well on current dose.  She had recent work-up for chest pain by Dr.Sunit Tolia, cardiologist which proved to be negative for coronary disease.  She presented to the emergency department with chest pain on March 2014.  MI was ruled out.  Subsequently cardiologist performed an echocardiogram which was normal and a myocardial perfusion study which was normal.  Left ventricular systolic ejection fraction on echo was 55%.  At last visit her lipid panel was excellent.  Slight elevation of LDL at 106.  Her TSH was normal.  Her creatinine at last visit was slightly elevated at 1.93 but she had not had fluids to drink prior to coming to the office.  B-Met will be repeated today since dose of olmesartan HCTZ was doubled.    Review of Systems feels well, looking forward to getting together with family over the [place] [facility] holiday.     Objective:   Physical Exam Blood pressure excellent at 120/80 pulse 60 pulse oximetry 97% weight 197 pounds BMI 32.78  Skin warm and dry.  Nodes none.  Neck supple without JVD thyromegaly or carotid bruits.  Chest clear to auscultation without rales or wheezing.  Cardiac exam regular rate and rhythm normal S1 and S2 without murmurs or gallops.  Extremities without edema.       Assessment & Plan:  Essential hypertension-control improved on increased dose of olmesartan HCTZ.  Current dose is 40/25.  Basic metabolic panel drawn and pending.  Plan: Return in November 2021 for 73-monthfollow-up which will include lipid panel and c-Met.   Continue current medications as prescribed.

## 2019-09-19 NOTE — Patient Instructions (Signed)
Basic metabolic panel drawn on increased dose of Benicar HCT. BP is excellent. RTC in 6 months. It was a pleasure to see you today.

## 2019-09-25 ENCOUNTER — Encounter: Payer: Self-pay | Admitting: Internal Medicine

## 2019-11-09 ENCOUNTER — Other Ambulatory Visit: Payer: Self-pay

## 2019-11-09 ENCOUNTER — Encounter: Payer: Self-pay | Admitting: Allergy

## 2019-11-09 ENCOUNTER — Ambulatory Visit (INDEPENDENT_AMBULATORY_CARE_PROVIDER_SITE_OTHER): Payer: Medicare Other | Admitting: Allergy

## 2019-11-09 VITALS — BP 118/70 | HR 58 | Resp 16

## 2019-11-09 DIAGNOSIS — T7800XD Anaphylactic reaction due to unspecified food, subsequent encounter: Secondary | ICD-10-CM

## 2019-11-09 DIAGNOSIS — L509 Urticaria, unspecified: Secondary | ICD-10-CM | POA: Diagnosis not present

## 2019-11-09 DIAGNOSIS — T783XXD Angioneurotic edema, subsequent encounter: Secondary | ICD-10-CM

## 2019-11-09 NOTE — Progress Notes (Signed)
Follow-up Note  RE: Joanna Ray MRN: [id number] DOB: Nov 24, 1950 Date of Office Visit: 11/09/2019   History of present illness: Joanna Ray is a 69 y.o. female presenting today for follow-up of hives and food allergy.  Her last visit vis telemedicine was performed on 06/29/19.  She does continue to take famotidine and fexofenadine daily as she has had about 3 episodes of hives since her last visit.  One epsiode she had a hive where her bra rubs and this occurred yesterday.  A second episode she develops having mild tongue swelling about 2 week ago that resolved with benadryl.  Otherwise this is the improvement in the frequency and intensity of her episodes than prior to medication management.  She has received both doses of the Moderna vaccine.   Review of systems: Review of Systems  Constitutional: Negative.   HENT: Negative.   Eyes: Negative.   Respiratory: Negative.   Cardiovascular: Negative.   Gastrointestinal: Negative.   Musculoskeletal: Negative.   Skin: Positive for itching and rash.  Neurological: Negative.     All other systems negative unless noted above in HPI  Past medical/social/surgical/family history have been reviewed and are unchanged unless specifically indicated below.  No changes  Medication List: Current Outpatient Medications  Medication Sig Dispense Refill  . ALPRAZolam (XANAX) 0.5 MG tablet Take 1 tablet (0.5 mg total) by mouth 2 (two) times daily as needed for anxiety. 60 tablet 1  . aspirin 81 MG tablet Take 81 mg by mouth daily.    . cholecalciferol (VITAMIN D) 1000 UNITS tablet Take 1,000 Units by mouth daily.    . COD LIVER OIL PO Take 1 capsule by mouth daily.     Marland Kitchen EPINEPHrine 0.3 mg/0.3 mL IJ SOAJ injection Inject 0.3 mLs (0.3 mg total) into the muscle as needed for anaphylaxis. 1 each 5  . escitalopram (LEXAPRO) 10 MG tablet Take 1 tablet (10 mg total) by mouth daily. (Patient taking differently: Take 10 mg by mouth daily as  needed (anxiety / depression). ) 30 tablet 5  . famotidine (PEPCID) 20 MG tablet Take 1 tablet (20 mg total) by mouth 2 (two) times daily. 180 tablet 1  . fexofenadine (ALLEGRA) 180 MG tablet Take 1 tablet (180 mg total) by mouth daily. 90 tablet 1  . olmesartan-hydrochlorothiazide (BENICAR HCT) 40-25 MG tablet Take 1 tablet by mouth daily. 90 tablet 1  . rosuvastatin (CRESTOR) 20 MG tablet Take 0.5 tablets (10 mg total) by mouth daily. Take 1/2 tablet 90 tablet 1  . vitamin E 100 UNIT capsule Take 100 Units by mouth daily.     No current facility-administered medications for this visit.     Known medication allergies: Allergies  Allergen Reactions  . Shellfish Allergy Swelling    Swelling of lips     Physical examination: Blood pressure 118/70, pulse (!) 58, resp. rate 16, SpO2 97 %.  General: Alert, interactive, in no acute distress. HEENT: PERRLA, TMs pearly gray, turbinates non-edematous without discharge, post-pharynx non erythematous. Neck: Supple without lymphadenopathy. Lungs: Clear to auscultation without wheezing, rhonchi or rales. {no increased work of breathing. CV: Normal S1, S2 without murmurs. Abdomen: Nondistended, nontender. Skin: Warm and dry, without lesions or rashes. Extremities:  No clubbing, cyanosis or edema. Neuro:   Grossly intact.  Diagnositics/Labs: None today  Assessment and plan:   Urticaria and angioedema Anaphylaxis due to food  - at this time etiology of hives and swelling is idiopathic or spontaneous.  Hives can be  caused by a variety of different triggers including illness/infection, foods, medications, stings, exercise, pressure, vibrations, extremes of temperature to name a few however majority of the time there is no identifiable trigger.    - labwork was negative for tomato and shellfish allergy.   Recommend performing a shrimp challenge in the office sometime this summer or afterwards once you are hopefully able to come off of all  antihistamines.    - labwork was reassuring however did show sensitivity to dog dander and molds.  Continue avoidance measures for dog dander and molds.    - for management of hives/swelling recommend the following high-dose antihistamine regimen: Continue Allegra 1 tablet daily with Pepcid 1 tablet daily. At the end of August 2021 if you are hive and swelling free then stop the Pepcid and only take Allegra daily. At the end of September 2021 if you are still hive and swelling free then stop the Allegra.    - Follow emergency action plan in case of future reaction which has been previously provided and have access to your Epipen  Follow-up 4-6 months or sooner if needed I appreciate the opportunity to take part in Joanna Ray's care. Please do not hesitate to contact me with questions.  Sincerely,   Prudy Feeler, MD Allergy/Immunology [facility]

## 2019-11-09 NOTE — Patient Instructions (Addendum)
Swelling and Hives Anaphylaxis due to food  - at this time etiology of hives and swelling is idiopathic or spontaneous.  Hives can be caused by a variety of different triggers including illness/infection, foods, medications, stings, exercise, pressure, vibrations, extremes of temperature to name a few however majority of the time there is no identifiable trigger.    - labwork was negative for tomato and shellfish allergy.   Recommend performing a shrimp challenge in the office sometime this summer or afterwards once you are hopefully able to come off of all antihistamines.    - labwork was reassuring however did show sensitivity to dog dander and molds.  Continue avoidance measures for dog dander and molds.    - for management of hives/swelling recommend the following high-dose antihistamine regimen: Continue Allegra 1 tablet daily with Pepcid 1 tablet daily. At the end of August 2021 if you are hive and swelling free then stop the Pepcid and only take Allegra daily. At the end of September 2021 if you are still hive and swelling free then stop the Allegra.    - Follow emergency action plan in case of future reaction which has been previously provided and have access to your Epipen  Follow-up 4-6 months or sooner if needed

## 2019-12-15 ENCOUNTER — Other Ambulatory Visit: Payer: Self-pay | Admitting: Internal Medicine

## 2019-12-15 DIAGNOSIS — H8110 Benign paroxysmal vertigo, unspecified ear: Secondary | ICD-10-CM

## 2019-12-15 DIAGNOSIS — F4321 Adjustment disorder with depressed mood: Secondary | ICD-10-CM

## 2019-12-15 NOTE — Telephone Encounter (Signed)
Needs  6 month follow up with lipid liver and c-met November before refilling Xanax

## 2019-12-18 NOTE — Telephone Encounter (Signed)
Scheduled 6 month appt in November. Please sign rx.

## 2020-01-24 ENCOUNTER — Other Ambulatory Visit: Payer: Self-pay | Admitting: Internal Medicine

## 2020-01-26 ENCOUNTER — Other Ambulatory Visit: Payer: Self-pay | Admitting: Internal Medicine

## 2020-02-15 ENCOUNTER — Telehealth: Payer: Self-pay | Admitting: Internal Medicine

## 2020-02-15 ENCOUNTER — Encounter: Payer: Self-pay | Admitting: Internal Medicine

## 2020-02-15 ENCOUNTER — Other Ambulatory Visit: Payer: Self-pay

## 2020-02-15 ENCOUNTER — Ambulatory Visit (INDEPENDENT_AMBULATORY_CARE_PROVIDER_SITE_OTHER): Payer: Medicare Other | Admitting: Internal Medicine

## 2020-02-15 VITALS — Temp 97.5°F | Ht 65.0 in

## 2020-02-15 DIAGNOSIS — J22 Unspecified acute lower respiratory infection: Secondary | ICD-10-CM | POA: Diagnosis not present

## 2020-02-15 DIAGNOSIS — R059 Cough, unspecified: Secondary | ICD-10-CM | POA: Diagnosis not present

## 2020-02-15 MED ORDER — CLARITHROMYCIN 500 MG PO TABS: 500.0000 mg | ORAL_TABLET | Freq: Two times a day (BID) | ORAL | 0 refills | Status: DC

## 2020-02-15 MED ORDER — HYDROCODONE-HOMATROPINE 5-1.5 MG/5ML PO SYRP: 5.0000 mL | ORAL_SOLUTION | Freq: Three times a day (TID) | ORAL | 0 refills | Status: DC | PRN

## 2020-02-15 NOTE — Patient Instructions (Addendum)
COVID-19 and respiratory virus panels obtained. Take Biaxin 500 mg twice daily with food for 10 days. Hycodan 1 teaspoon every 8 hours as needed for cough. Rest and drink plenty of fluids.  Please quarantine until results of these tests are back.

## 2020-02-15 NOTE — Telephone Encounter (Signed)
Joanna Ray [phone]  [name] called to say she has cough, congestion yellow mucus, since Sunday. No fever, No COVID exposure, COVID vaccines back in Feb and April

## 2020-02-15 NOTE — Telephone Encounter (Signed)
scheduled

## 2020-02-15 NOTE — Telephone Encounter (Signed)
Car visit 

## 2020-02-15 NOTE — Progress Notes (Signed)
° °  Subjective:    Patient ID: Joanna Ray, female    DOB: May 14, 1950, 69 y.o.   MRN: [id number]  HPI 69 year old Female seen for lower respiratory infection symptoms onset after a trip to the mountains this past weekend.  Patient has had 2 maternal vaccines in February and March of this year.  She has had 2 pneumonia vaccines as well including Prevnar 13 in 2018 and pneumococcal 23 in 2019.  Patient complaining of cough with discolored sputum production.  No documented fever.  Has malaise and fatigue.    Review of Systems no nausea, vomiting, headache, myalgias, dysgeusia.  She is a former smoker.  Quit in 1980.     Objective:   Physical Exam She is afebrile.  Pulse is regular.  COVID-19 test and Respiratory virus panel obtained by nasal swab.  She sounds nasally congested when she speaks.  Chest  is clear.       Assessment & Plan:   Acute lower respiratory infection  Plan: Biaxin 500 mg twice daily for 10 days.  Hycodan 1 teaspoon p.o. every 8 hours as needed cough.  Rest and drink plenty of fluids.  Please quarantine until results of these panels are back.

## 2020-02-20 ENCOUNTER — Encounter: Payer: Self-pay | Admitting: Cardiology

## 2020-02-20 ENCOUNTER — Ambulatory Visit: Payer: Medicare Other | Admitting: Cardiology

## 2020-02-20 ENCOUNTER — Other Ambulatory Visit: Payer: Self-pay

## 2020-02-20 VITALS — BP 139/79 | HR 86 | Ht 65.0 in | Wt 204.0 lb

## 2020-02-20 DIAGNOSIS — I10 Essential (primary) hypertension: Secondary | ICD-10-CM

## 2020-02-20 DIAGNOSIS — R072 Precordial pain: Secondary | ICD-10-CM

## 2020-02-20 DIAGNOSIS — Z87891 Personal history of nicotine dependence: Secondary | ICD-10-CM | POA: Diagnosis not present

## 2020-02-20 DIAGNOSIS — E782 Mixed hyperlipidemia: Secondary | ICD-10-CM | POA: Diagnosis not present

## 2020-02-20 LAB — RESPIRATORY VIRUS PANEL
Adenovirus B: NOT DETECTED
HUMAN PARAINFLU VIRUS 1: DETECTED — AB
HUMAN PARAINFLU VIRUS 2: NOT DETECTED
HUMAN PARAINFLU VIRUS 3: NOT DETECTED
INFLUENZA A SUBTYPE H1: NOT DETECTED
INFLUENZA A SUBTYPE H3: NOT DETECTED
Influenza A: NOT DETECTED
Influenza B: NOT DETECTED
Metapneumovirus: NOT DETECTED
Respiratory Syncytial Virus A: NOT DETECTED
Respiratory Syncytial Virus B: NOT DETECTED
Rhinovirus: NOT DETECTED

## 2020-02-20 LAB — SARS-COV-2 RNA,(COVID-19) QUALITATIVE NAAT: SARS CoV2 RNA: NOT DETECTED

## 2020-02-20 NOTE — Progress Notes (Signed)
Joanna Ray Date of Birth: April 09, 1951 MRN: [id number] Primary Care Provider:Baxley, Cresenciano Lick, MD Primary Cardiologist: Rex Kras, DO, [facility] (established care 07/10/2019)  Date: 02/20/2020 Last Office Visit: 08/21/2019  Chief Complaint  Patient presents with  . Precordial chest pain  . Follow-up    6 month   HPI  Joanna Ray is a 69 y.o. female who presents to the office with a chief complaint of " 93-month Follow-up to reevaluate chest pain." Patient's past medical history and cardiac risk factors include: Hypertension, hyperlipidemia, postmenopausal female, advanced age, former smoker.  Patient was initially referred to the office at the request of her primary care provider for evaluation of chest pain and hospital follow-up.  Patient was seen in consultation on July 10, 2019.  Patient is here for her 6 months follow-up to reevaluate her symptoms of chest pain.  Since last office visit patient states that she feels well from a cardiovascular standpoint.  No recent hospitalizations or urgent care visits for cardiovascular symptoms.  Patient states that she has not noticed any change in her overall physical endurance.  Patient's cardiovascular risk factors include hypertension and hyperlipidemia.  She states that her blood pressure at home usually averages around 137/80 mmHg.  I do not have a blood pressure log to review.  She states that she checks at least once a week.  In regards to hyperlipidemia patient states that she has an upcoming appointment with her PCP in November and will have her lipids checked prior to that.  Patient is encouraged to bring them in at the next office visit.  Denies prior history of coronary artery disease, myocardial infarction, congestive heart failure, deep venous thrombosis, pulmonary embolism, stroke, transient ischemic attack.  FUNCTIONAL STATUS: Walks in a park 3x per week for 30 minutes in duration.    ALLERGIES: Allergies  Allergen  Reactions  . Shellfish Allergy Swelling    Swelling of lips    MEDICATION LIST PRIOR TO VISIT: Current Outpatient Medications on File Prior to Visit  Medication Sig Dispense Refill  . ALPRAZolam (XANAX) 0.5 MG tablet Take 1 tablet by mouth twice daily as needed for anxiety 60 tablet 2  . aspirin 81 MG tablet Take 81 mg by mouth daily.    . cholecalciferol (VITAMIN D) 1000 UNITS tablet Take 1,000 Units by mouth daily.    Marland Kitchen EPINEPHrine 0.3 mg/0.3 mL IJ SOAJ injection Inject 0.3 mLs (0.3 mg total) into the muscle as needed for anaphylaxis. 1 each 5  . escitalopram (LEXAPRO) 10 MG tablet Take 1 tablet (10 mg total) by mouth daily. (Patient taking differently: Take 10 mg by mouth daily as needed (anxiety / depression). ) 30 tablet 5  . fexofenadine (ALLEGRA) 180 MG tablet Take 1 tablet (180 mg total) by mouth daily. 90 tablet 1  . HYDROcodone-homatropine (HYCODAN) 5-1.5 MG/5ML syrup Take 5 mLs by mouth every 8 (eight) hours as needed for cough. 120 mL 0  . rosuvastatin (CRESTOR) 20 MG tablet Take 1/2 (one-half) tablet by mouth once daily 45 tablet 1  . vitamin E 100 UNIT capsule Take 100 Units by mouth daily.     No current facility-administered medications on file prior to visit.    PAST MEDICAL HISTORY: Past Medical History:  Diagnosis Date  . Anxiety   . GERD (gastroesophageal reflux disease)   . Hyperlipidemia   . Hypertension   . Urticaria     PAST SURGICAL HISTORY: Past Surgical History:  Procedure Laterality Date  . CATARACT EXTRACTION,  BILATERAL Bilateral   . DILATION AND CURETTAGE OF UTERUS      FAMILY HISTORY: The patient family history includes Breast cancer in her sister; Diabetes in her sister; Stroke in her father and mother.   SOCIAL HISTORY:  The patient  reports that she quit smoking about 41 years ago. Her smoking use included cigarettes. She has a 5.00 pack-year smoking history. She has never used smokeless tobacco. She reports that she does not drink alcohol  and does not use drugs.  Review of Systems  Constitutional: Negative for chills and fever.  HENT: Negative for hoarse voice and nosebleeds.   Eyes: Negative for discharge, double vision and pain.  Cardiovascular: Negative for chest pain, claudication, dyspnea on exertion, leg swelling, near-syncope, orthopnea, palpitations, paroxysmal nocturnal dyspnea and syncope.  Respiratory: Negative for hemoptysis and shortness of breath.   Musculoskeletal: Negative for muscle cramps and myalgias.  Gastrointestinal: Negative for abdominal pain, constipation, diarrhea, hematemesis, hematochezia, melena, nausea and vomiting.  Neurological: Negative for dizziness and light-headedness.    PHYSICAL EXAM: Vitals with BMI 02/20/2020 02/15/2020 11/09/2019  Height 5\' 5"  5\' 5"  -  Weight 204 lbs - -  BMI 62.69 - -  Systolic 485 - 462  Diastolic 79 - 70  Pulse 86 - 58   CONSTITUTIONAL: Well-developed and well-nourished. No acute distress.  SKIN: Skin is warm and dry. No rash noted. No cyanosis. No pallor. No jaundice HEAD: Normocephalic and atraumatic.  EYES: No scleral icterus  MOUTH/THROAT: Moist oral membranes.  NECK: No JVD present. No thyromegaly noted. No carotid bruits  LYMPHATIC: No visible cervical adenopathy.  CHEST Normal respiratory effort. No intercostal retractions  LUNGS: Clear to auscultation bilaterally.  No stridor. No wheezes. No rales.  CARDIOVASCULAR: Regular rate and rhythm, positive S1-S2, no murmurs rubs or gallops appreciated. ABDOMINAL: Soft, nontender, nondistended, positive bowel sounds all 4 quadrants.  No apparent ascites.  EXTREMITIES: No peripheral edema  HEMATOLOGIC: No significant bruising NEUROLOGIC: Oriented to person, place, and time. Nonfocal. Normal muscle tone.  PSYCHIATRIC: Normal mood and affect. Normal behavior. Cooperative  CARDIAC DATABASE: EKG: 02/20/2020: Normal sinus rhythm, 80 bpm, normal axis, without underlying ischemia or injury  pattern.  Echocardiogram: 08/10/2019: LVEF 55% mild concentric LVH, normal diastolic filling pattern, mildly dilated left atrium, mild AR, mild MR, mild TR.  Stress Testing:  Lexiscan (Walking with mod Bruce)Tetrofosmin Stress Test  07/22/2019: Myocardial perfusion is normal. Stress LV EF: 73%. Nondiagnostic ECG stress. Low risk.   Heart Catheterization: None  LABORATORY DATA: CBC Latest Ref Rng & Units 08/29/2019 07/09/2019 12/28/2018  WBC 3.8 - 10.8 Thousand/uL 4.5 6.6 6.1  Hemoglobin 11.7 - 15.5 g/dL 12.3 12.5 12.5  Hematocrit 35 - 45 % 37.0 39.0 37.9  Platelets 140 - 400 Thousand/uL 216 198 -    CMP Latest Ref Rng & Units 09/19/2019 08/29/2019 07/09/2019  Glucose 65 - 99 mg/dL 96 93 125(H)  BUN 7 - 25 mg/dL 17 20 19   Creatinine 0.50 - 0.99 mg/dL 1.13(H) 1.03(H) 1.05(H)  Sodium 135 - 146 mmol/L 140 142 139  Potassium 3.5 - 5.3 mmol/L 4.1 4.4 4.1  Chloride 98 - 110 mmol/L 102 105 103  CO2 20 - 32 mmol/L 29 30 28   Calcium 8.6 - 10.4 mg/dL 9.6 9.5 9.6  Total Protein 6.1 - 8.1 g/dL - 7.3 -  Total Bilirubin 0.2 - 1.2 mg/dL - 0.9 -  Alkaline Phos 39 - 117 IU/L - - -  AST 10 - 35 U/L - 15 -  ALT 6 -  29 U/L - 11 -    Lipid Panel     Component Value Date/Time   CHOL 173 08/29/2019 1122   CHOL 157 07/11/2019 0925   TRIG 96 08/29/2019 1122   HDL 48 (L) 08/29/2019 1122   HDL 49 07/11/2019 0925   CHOLHDL 3.6 08/29/2019 1122   VLDL 21 07/31/2016 1009   LDLCALC 106 (H) 08/29/2019 1122   LABVLDL 18 07/11/2019 0925    Lab Results  Component Value Date   HGBA1C 5.6 07/11/2019   HGBA1C 5.8 (H) 11/30/2013   HGBA1C 5.6 09/26/2011   No components found for: NTPROBNP Lab Results  Component Value Date   TSH 1.94 08/29/2019   TSH 1.99 08/26/2018   TSH 1.59 08/17/2017    FINAL MEDICATION LIST END OF ENCOUNTER: No orders of the defined types were placed in this encounter.   Medications Discontinued During This Encounter  Medication Reason  . clarithromycin (BIAXIN) 500 MG tablet  Completed Course  . COD LIVER OIL PO No longer needed (for PRN medications)  . famotidine (PEPCID) 20 MG tablet Patient Preference  . olmesartan-hydrochlorothiazide (BENICAR HCT) 40-25 MG tablet Discontinued by provider     Current Outpatient Medications:  .  ALPRAZolam (XANAX) 0.5 MG tablet, Take 1 tablet by mouth twice daily as needed for anxiety, Disp: 60 tablet, Rfl: 2 .  aspirin 81 MG tablet, Take 81 mg by mouth daily., Disp: , Rfl:  .  cholecalciferol (VITAMIN D) 1000 UNITS tablet, Take 1,000 Units by mouth daily., Disp: , Rfl:  .  EPINEPHrine 0.3 mg/0.3 mL IJ SOAJ injection, Inject 0.3 mLs (0.3 mg total) into the muscle as needed for anaphylaxis., Disp: 1 each, Rfl: 5 .  escitalopram (LEXAPRO) 10 MG tablet, Take 1 tablet (10 mg total) by mouth daily. (Patient taking differently: Take 10 mg by mouth daily as needed (anxiety / depression). ), Disp: 30 tablet, Rfl: 5 .  fexofenadine (ALLEGRA) 180 MG tablet, Take 1 tablet (180 mg total) by mouth daily., Disp: 90 tablet, Rfl: 1 .  HYDROcodone-homatropine (HYCODAN) 5-1.5 MG/5ML syrup, Take 5 mLs by mouth every 8 (eight) hours as needed for cough., Disp: 120 mL, Rfl: 0 .  rosuvastatin (CRESTOR) 20 MG tablet, Take 1/2 (one-half) tablet by mouth once daily, Disp: 45 tablet, Rfl: 1 .  vitamin E 100 UNIT capsule, Take 100 Units by mouth daily., Disp: , Rfl:   IMPRESSION:    ICD-10-CM   1. Precordial chest pain  R07.2 EKG 12-Lead  2. Mixed hyperlipidemia  E78.2   3. Benign hypertension  I10   4. Former smoker  Z87.891      RECOMMENDATIONS: SEERAT PEADEN is a 69 y.o. female whose past medical history and cardiac risk factors include: Hypertension, hyperlipidemia, postmenopausal female, advanced age, former smoker.  Reevaluation of precordial chest pain: Since last office visit appears that the patient is stable from a cardiovascular standpoint.  She has not had any recurrence of chest pain.  Her physical endurance remains stable.  EKG at  today's office visit notes normal sinus rhythm without underlying injury pattern.  She has recently undergone an echocardiogram and stress test results which were reviewed at today's visit and noted above for further reference as well.  Educated on the importance of continue lifestyle changes to improve your modificable risk factors. Encouraged 30 minutes of exercise 5 days a week or as tolerated.  Patient is motivated and understands that her blood pressure and cholesterol will improve with such lifestyle modifications.  Benign essential  hypertension: . Medication reconciled.  Currently managed by primary team. . Low salt diet recommended. A diet that is rich in fruits, vegetables, legumes, and low-fat dairy products and low in snacks, sweets, and meats (such as the Dietary Approaches to Stop Hypertension [DASH] diet).   Mixed hyperlipidemia: . Currently on Crestor 20 mg p.o. nightly.  . Patient denies myalgia or other side effects. . Patient plans to have a repeat fasting lipid profile performed prior to her next office visit with PCP.  She is asked me to send [place] a copy or bring it in at the next office visit.  --Continue cardiac medications as reconciled in final medication list. --Return in about 1 year (around 02/19/2021) for 1 year follow for primary prevention. . Or sooner if needed. --Continue follow-up with your primary care physician regarding the management of your other chronic comorbid conditions.  Patient's questions and concerns were addressed to her satisfaction. She voices understanding of the instructions provided during this encounter.   This note was created using a voice recognition software as a result there may be grammatical errors inadvertently enclosed that do not reflect the nature of this encounter. Every attempt is made to correct such errors.  Rex Kras, [place], [facility]  Pager: [phone] Office: [phone]

## 2020-02-26 ENCOUNTER — Ambulatory Visit (INDEPENDENT_AMBULATORY_CARE_PROVIDER_SITE_OTHER): Payer: Medicare Other | Admitting: Internal Medicine

## 2020-02-26 ENCOUNTER — Telehealth: Payer: Self-pay | Admitting: Internal Medicine

## 2020-02-26 ENCOUNTER — Telehealth: Payer: Self-pay

## 2020-02-26 DIAGNOSIS — Z538 Procedure and treatment not carried out for other reasons: Secondary | ICD-10-CM

## 2020-02-26 NOTE — Telephone Encounter (Signed)
Scheduled office visit, she does not have an ortho doctor.

## 2020-02-26 NOTE — Telephone Encounter (Signed)
Joanna Ray [phone]  [name] called to say ever since she was here 2 weeks ago she has had right arm and shoulder pain but it has continued to get worse, this weekend she has not been able to lift her arm and the pain is 15.

## 2020-02-26 NOTE — Telephone Encounter (Signed)
Left message for patient to call the office back concerning an appointment.

## 2020-02-26 NOTE — Telephone Encounter (Signed)
This sounds like a shoulder issue not related to a respiratory infection for which she was last seen. Does she have an orthopedist she can call?

## 2020-02-28 ENCOUNTER — Ambulatory Visit: Payer: Medicare Other | Admitting: Family Medicine

## 2020-03-01 ENCOUNTER — Other Ambulatory Visit: Payer: Medicare Other | Admitting: Internal Medicine

## 2020-03-01 ENCOUNTER — Other Ambulatory Visit: Payer: Self-pay

## 2020-03-01 ENCOUNTER — Ambulatory Visit: Payer: Self-pay

## 2020-03-01 ENCOUNTER — Encounter: Payer: Self-pay | Admitting: Family Medicine

## 2020-03-01 ENCOUNTER — Ambulatory Visit (INDEPENDENT_AMBULATORY_CARE_PROVIDER_SITE_OTHER): Payer: Medicare Other | Admitting: Family Medicine

## 2020-03-01 ENCOUNTER — Telehealth: Payer: Self-pay

## 2020-03-01 DIAGNOSIS — E7800 Pure hypercholesterolemia, unspecified: Secondary | ICD-10-CM

## 2020-03-01 DIAGNOSIS — M25511 Pain in right shoulder: Secondary | ICD-10-CM | POA: Diagnosis not present

## 2020-03-01 DIAGNOSIS — I10 Essential (primary) hypertension: Secondary | ICD-10-CM

## 2020-03-01 MED ORDER — TRAMADOL HCL 50 MG PO TABS: 50.0000 mg | ORAL_TABLET | Freq: Four times a day (QID) | ORAL | 0 refills | Status: DC | PRN

## 2020-03-01 NOTE — Telephone Encounter (Signed)
Patient called in saying pharmacy will not fill for 30 pills can only do 20 .. patient wants to know can dr hilts send 10 pills to [facility] on [address]

## 2020-03-01 NOTE — Telephone Encounter (Signed)
I called the patient. On the first Rx of any opioid, it is true you can only get a 7-day supply. After that, a full Rx can be prescribed and received, if she needs a refill. The patient voiced understanding.

## 2020-03-01 NOTE — Progress Notes (Signed)
Office Visit Note   Patient: Joanna Ray           Date of Birth: 12-Feb-1951           MRN: [id number] Visit Date: 03/01/2020 Requested by: [name], MD [address] [place],  [place] [address] PCP: [name], MD  Subjective: Chief Complaint  Patient presents with  . Right Shoulder - Pain    Pain x 3 weeks, along with decreased ROM. Wakes her from sleep. NKI. The only thing she can think of is that she slept very hard on that arm, while she was taking cough med with codeine.    HPI: She is here with right shoulder pain.  Symptoms started about 3 weeks ago, no injury.  She recalls taking cough syrup and sleeping very heavily one night, she woke up with some numbness in her arm.  He has continued to bother her since then with pain level of 15/10 and weakness.  She is right-hand dominant.  No previous problems with her shoulder.              ROS:   All other systems were reviewed and are negative.  Objective: Vital Signs: There were no vitals taken for this visit.  Physical Exam:  General:  Alert and oriented, in no acute distress. Pulm:  Breathing unlabored. Psy:  Normal mood, congruent affect. Skin: No rash Right shoulder: She has full passive range of motion but significant weakness with abduction.  She also has weakness with external rotation.  Internal rotation strength is normal.  Speeds test is negative, biceps and triceps strength are normal, wrist and intrinsic hand strength is normal.   Imaging: US Guided Needle Placement  Result Date: 03/01/2020 Diagnostic ultrasound right shoulder: Long head biceps tendon is located in its groove.  Subscapularis tendon looks normal.  Supraspinatus has tendinopathy changes but no high-grade tear.  Infraspinatus tendon looks intact.  There is thickening of the subacromial/subdeltoid bursa, no hyperemia on power Doppler imaging.   Assessment & Plan: 1.  Right shoulder weakness, etiology uncertain.  Does not  appear to be rotator cuff tear.  Could be nerve compression injury. -Discussed options with her and elected to inject the subacromial space.  We will have her start physical therapy.  If symptoms persist, then nerve conduction studies.     Procedures: Right shoulder injection: After sterile prep with Betadine, injected 5 cc 1% lidocaine without epinephrine and 6 mg betamethasone into the subacromial space from posterior approach.    PMFS History: Patient Active Problem List   Diagnosis Date Noted  . Angioedema 01/21/2019  . Vertigo 09/28/2016  . Hyperlipidemia 09/25/2011  . Hypertension    Past Medical History:  Diagnosis Date  . Anxiety   . GERD (gastroesophageal reflux disease)   . Hyperlipidemia   . Hypertension   . Urticaria     Family History  Problem Relation Age of Onset  . Stroke Mother   . Stroke Father   . Diabetes Sister   . Breast cancer Sister   . Colon cancer Neg Hx   . Colon polyps Neg Hx   . Esophageal cancer Neg Hx   . Rectal cancer Neg Hx   . Stomach cancer Neg Hx     Past Surgical History:  Procedure Laterality Date  . CATARACT EXTRACTION, BILATERAL Bilateral   . DILATION AND CURETTAGE OF UTERUS     Social History   Occupational History  . Occupation: Theatre manager  Tobacco  Use  . Smoking status: Former Smoker    Packs/day: 0.25    Years: 20.00    Pack years: 5.00    Types: Cigarettes    Quit date: 1980    Years since quitting: 41.8  . Smokeless tobacco: Never Used  Vaping Use  . Vaping Use: Never used  Substance and Sexual Activity  . Alcohol use: No  . Drug use: No  . Sexual activity: Not on file

## 2020-03-01 NOTE — Addendum Note (Signed)
Addended by: Mady Haagensen on: 03/01/2020 09:09 AM   Modules accepted: Orders

## 2020-03-04 ENCOUNTER — Other Ambulatory Visit: Payer: Self-pay

## 2020-03-04 ENCOUNTER — Other Ambulatory Visit: Payer: Medicare Other | Admitting: Internal Medicine

## 2020-03-04 DIAGNOSIS — I10 Essential (primary) hypertension: Secondary | ICD-10-CM

## 2020-03-04 DIAGNOSIS — E7800 Pure hypercholesterolemia, unspecified: Secondary | ICD-10-CM | POA: Diagnosis not present

## 2020-03-04 LAB — HEPATIC FUNCTION PANEL
AG Ratio: 1.1 (calc) (ref 1.0–2.5)
ALT: 14 U/L (ref 6–29)
AST: 18 U/L (ref 10–35)
Albumin: 4.2 g/dL (ref 3.6–5.1)
Alkaline phosphatase (APISO): 42 U/L (ref 37–153)
Bilirubin, Direct: 0.1 mg/dL (ref 0.0–0.2)
Globulin: 3.7 g/dL (calc) (ref 1.9–3.7)
Indirect Bilirubin: 0.6 mg/dL (calc) (ref 0.2–1.2)
Total Bilirubin: 0.7 mg/dL (ref 0.2–1.2)
Total Protein: 7.9 g/dL (ref 6.1–8.1)

## 2020-03-04 LAB — LIPID PANEL
Cholesterol: 165 mg/dL (ref <200)
HDL: 53 mg/dL (ref >=50)
LDL Cholesterol (Calc): 92 mg/dL (calc)
Non-HDL Cholesterol (Calc): 112 mg/dL (calc) (ref <130)
Total CHOL/HDL Ratio: 3.1 (calc) (ref <5.0)
Triglycerides: 108 mg/dL (ref <150)

## 2020-03-04 LAB — COMPLETE METABOLIC PANEL WITH GFR
AG Ratio: 1.1 (calc) (ref 1.0–2.5)
ALT: 14 U/L (ref 6–29)
AST: 18 U/L (ref 10–35)
Albumin: 4.2 g/dL (ref 3.6–5.1)
Alkaline phosphatase (APISO): 42 U/L (ref 37–153)
BUN/Creatinine Ratio: 22 (calc) (ref 6–22)
BUN: 29 mg/dL — ABNORMAL HIGH (ref 7–25)
CO2: 30 mmol/L (ref 20–32)
Calcium: 9.5 mg/dL (ref 8.6–10.4)
Chloride: 101 mmol/L (ref 98–110)
Creat: 1.29 mg/dL — ABNORMAL HIGH (ref 0.50–0.99)
GFR, Est African American: 49 mL/min/{1.73_m2} — ABNORMAL LOW (ref >=60)
GFR, Est Non African American: 42 mL/min/{1.73_m2} — ABNORMAL LOW (ref >=60)
Globulin: 3.7 g/dL (calc) (ref 1.9–3.7)
Glucose, Bld: 84 mg/dL (ref 65–99)
Potassium: 4.3 mmol/L (ref 3.5–5.3)
Sodium: 140 mmol/L (ref 135–146)
Total Bilirubin: 0.7 mg/dL (ref 0.2–1.2)
Total Protein: 7.9 g/dL (ref 6.1–8.1)

## 2020-03-05 ENCOUNTER — Ambulatory Visit (INDEPENDENT_AMBULATORY_CARE_PROVIDER_SITE_OTHER): Payer: Medicare Other | Admitting: Internal Medicine

## 2020-03-05 ENCOUNTER — Encounter: Payer: Self-pay | Admitting: Internal Medicine

## 2020-03-05 ENCOUNTER — Other Ambulatory Visit: Payer: Self-pay

## 2020-03-05 VITALS — BP 120/70 | HR 88 | Ht 65.0 in | Wt 202.0 lb

## 2020-03-05 DIAGNOSIS — F419 Anxiety disorder, unspecified: Secondary | ICD-10-CM

## 2020-03-05 DIAGNOSIS — R7989 Other specified abnormal findings of blood chemistry: Secondary | ICD-10-CM | POA: Diagnosis not present

## 2020-03-05 DIAGNOSIS — I10 Essential (primary) hypertension: Secondary | ICD-10-CM

## 2020-03-05 DIAGNOSIS — Z23 Encounter for immunization: Secondary | ICD-10-CM

## 2020-03-05 DIAGNOSIS — E7800 Pure hypercholesterolemia, unspecified: Secondary | ICD-10-CM | POA: Diagnosis not present

## 2020-03-05 DIAGNOSIS — F32A Depression, unspecified: Secondary | ICD-10-CM

## 2020-03-05 MED ORDER — HYDROCHLOROTHIAZIDE 25 MG PO TABS: 25.0000 mg | ORAL_TABLET | Freq: Every day | ORAL | 0 refills | Status: DC

## 2020-03-05 MED ORDER — AMLODIPINE BESYLATE 5 MG PO TABS: 5.0000 mg | ORAL_TABLET | Freq: Every day | ORAL | 0 refills | Status: DC

## 2020-03-05 NOTE — Patient Instructions (Addendum)
D/C olmesartan/HCTZ due to elevated creatinine. Start HCTZ 25 mg daily and amlodipine 5 mg daily and f/u in 2 weeks. Continue anti-anxiety medication.  Continue Lexapro and Xanax for anxiety/ depression.

## 2020-03-09 ENCOUNTER — Other Ambulatory Visit: Payer: Self-pay | Admitting: Family Medicine

## 2020-03-14 ENCOUNTER — Ambulatory Visit (INDEPENDENT_AMBULATORY_CARE_PROVIDER_SITE_OTHER): Payer: Medicare Other | Admitting: Physical Therapy

## 2020-03-14 ENCOUNTER — Other Ambulatory Visit: Payer: Self-pay

## 2020-03-14 ENCOUNTER — Encounter: Payer: Self-pay | Admitting: Physical Therapy

## 2020-03-14 DIAGNOSIS — M6281 Muscle weakness (generalized): Secondary | ICD-10-CM | POA: Diagnosis not present

## 2020-03-14 DIAGNOSIS — M25511 Pain in right shoulder: Secondary | ICD-10-CM | POA: Diagnosis not present

## 2020-03-14 NOTE — Patient Instructions (Signed)
Access Code: [email or web address] URL: [email or web address] Date: 03/14/2020 Prepared by: Elsie Ra  Exercises Supine Shoulder Flexion Extension AAROM with Dowel - 2 x daily - 6 x weekly - 1-2 sets - 10 reps Supine Shoulder Abduction AAROM with Dowel - 2 x daily - 6 x weekly - 1-2 sets - 10 reps Supine Shoulder External Rotation in 45 Degrees Abduction AAROM with Dowel - 2 x daily - 6 x weekly - 1-2 sets - 10 reps Shoulder External Rotation and Scapular Retraction - 2 x daily - 6 x weekly - 1-2 sets - 10 reps Isometric Shoulder Flexion at Wall - 2 x daily - 6 x weekly - 1-2 sets - 10 reps - 5 hold Standing Isometric Shoulder Abduction with Doorway - Arm Bent - 2 x daily - 6 x weekly - 1-2 sets - 10 reps - 5 hold Standing Isometric Shoulder External Rotation with Doorway and Towel Roll - 2 x daily - 6 x weekly - 1-2 sets - 10 reps - 5 hold  Patient Education TENS Unit

## 2020-03-14 NOTE — Therapy (Signed)
[facility] Physical Therapy [address] [place], [place], [address] Phone: [phone]   Fax:  [phone]  Physical Therapy Evaluation  Patient Details  Name: Joanna Ray MRN: [id number] Date of Birth: April 24, 1951 Referring Provider (PT): Eunice Blase, MD   Encounter Date: 03/14/2020   PT End of Session - 03/14/20 1451    Visit Number 1    Number of Visits 12    Date for PT Re-Evaluation 05/09/20    Authorization Type MCR    PT Start Time 2542    PT Stop Time 1430    PT Time Calculation (min) 45 min    Activity Tolerance Patient limited by pain    Behavior During Therapy [facility] for tasks assessed/performed           Past Medical History:  Diagnosis Date  . Anxiety   . GERD (gastroesophageal reflux disease)   . Hyperlipidemia   . Hypertension   . Urticaria     Past Surgical History:  Procedure Laterality Date  . CATARACT EXTRACTION, BILATERAL Bilateral   . DILATION AND CURETTAGE OF UTERUS      There were no vitals filed for this visit.    Subjective Assessment - 03/14/20 1348    Subjective Pain x 5 weeks, along with decreased ROM. Wakes her from sleep. NKI. The only thing she can think of is that she slept very hard on that arm after taking medicine for respiratory infection. She woke up having numbness and tingling down her arm with svere pain and weakness. Had injection 03/01/20 and this helped some with flexion but not with abduction    Diagnostic tests Diagnostic ultrasound right shoulder: Long head biceps tendon is located in its groove.  Subscapularis tendon looks normal.  Supraspinatus has tendinopathy changes but no high-grade tear.  Infraspinatus tendon looks intact.  There is thickening of the subacromial/subdeltoid bursa, no hyperemia on power Doppler imaging.    Patient Stated Goals reduce pain and improve funciton of her Rt arm    Currently in Pain? Yes    Pain Score 9     Pain Location Shoulder    Pain Orientation Right      Pain Descriptors / Indicators Sharp    Pain Type Acute pain    Pain Radiating Towards down Rt arm, N/T has improved since injection but still with burning sensations    Pain Onset More than a month ago    Pain Frequency Constant    Aggravating Factors  moving her arm    Pain Relieving Factors laying on left side, meds    Effect of Pain on Daily Activities limits most ADLS involving using her arm    Multiple Pain Sites No              [facility] PT Assessment - 03/14/20 0001      Assessment   Medical Diagnosis M25.511 (ICD-10-CM) - Acute pain of right shoulder    Referring Provider (PT) Hilts, Michael, MD    Onset Date/Surgical Date --   5 week onset of pain   Hand Dominance Right    Next MD Visit PRN    Prior Therapy none      Precautions   Precautions None      Balance Screen   Has the patient fallen in the past 6 months No    Has the patient had a decrease in activity level because of a fear of falling?  No    Is the patient reluctant to leave their  home because of a fear of falling?  No      Home Ecologist residence      Prior Function   Level of Independence Independent    Vocation Full time employment    Vocation Requirements teaches    Leisure movies, walk, mountains      Cognition   Overall Cognitive Status Within Functional Limits for tasks assessed      Observation/Other Assessments   Observations some atrophy in Rt shoulder    Focus on Therapeutic Outcomes (FOTO)  32%, predicted 62%      ROM / Strength   AROM / PROM / Strength AROM;PROM;Strength      AROM   AROM Assessment Site Shoulder    Right/Left Shoulder Right    Right Shoulder Flexion 30 Degrees    Right Shoulder ABduction 50 Degrees    Right Shoulder Internal Rotation --   can not clear iliac crest reaching behind back   Right Shoulder External Rotation --   Can not reach behind her head      PROM   Overall PROM Comments PROM WFL except ER limiited to about 70  deg      Strength   Strength Assessment Site Shoulder    Right/Left Shoulder Right    Right Shoulder Flexion 2/5    Right Shoulder Extension 3+/5    Right Shoulder ABduction 2/5    Right Shoulder Internal Rotation 4/5    Right Shoulder External Rotation 2/5      Palpation   Palpation comment TTP traps, RTC, deltoids, biceps tendon      Special Tests   Other special tests negative spulings test, + crank test for labrum, + drop arm test           Objective measurements completed on examination: See above findings.       [facility] Adult PT Treatment/Exercise - 03/14/20 0001      Exercises   Exercises Shoulder      Shoulder Exercises: Supine   External Rotation Right;5 reps;AAROM    Flexion AAROM;Right;5 reps    ABduction AAROM;Right;5 reps      Shoulder Exercises: Isometric Strengthening   Flexion 3X3"    External Rotation 3X3"    ABduction 3X3"      Modalities   Modalities Electrical Stimulation;Moist Heat      Moist Heat Therapy   Number Minutes Moist Heat 10 Minutes    Moist Heat Location Shoulder      Electrical Stimulation   Electrical Stimulation Location Rt shoulder    Electrical Stimulation Action IFC    Electrical Stimulation Parameters tolerance    Electrical Stimulation Goals Pain                  PT Education - 03/14/20 1450    Education Details HEP, POC,TENS    Person(s) Educated Patient    Methods Explanation;Demonstration;Verbal cues;Handout    Comprehension Verbalized understanding;Returned demonstration;Need further instruction            PT Short Term Goals - 03/14/20 1458      PT SHORT TERM GOAL #1   Title Pt will be I and compliant with HEP    Time 4    Period Weeks    Status New    Target Date 04/11/20      PT SHORT TERM GOAL #2   Title Pt will improve FOTO 5%    Time 4    Period Weeks  Status New             PT Long Term Goals - 03/14/20 1459      PT LONG TERM GOAL #1   Title Pt will improve FOTO to 62%  functional    Time 8    Period Weeks    Status New    Target Date 05/09/20      PT LONG TERM GOAL #2   Title Pt will improve Rt shoulder strength to 4+ all planes    Time 8    Period Weeks    Status New      PT LONG TERM GOAL #3   Title Pt will improve Rt shoulder AROM to [facility]    Time 8    Period Weeks    Status New      PT LONG TERM GOAL #4   Title Pt will reduce overall pain to 4 or less    Time 8    Period Weeks    Status New                  Plan - 03/14/20 1452    Clinical Impression Statement Pt presents with Rt shoulder pain and weakness after sleeping on her Rt arm all night. She has numbness and tingling and severe weakness which is a concern for nerve damage or brachial plexus injury. Cervical radicular tests negative today.  She will benefit from skilled PT to address her functional deficits in strength, ROM and use of her Rt arm as well as to decrease overall pain.    Examination-Activity Limitations Carry;Dressing;Hygiene/Grooming;Sleep;Reach Overhead    Examination-Participation Restrictions Cleaning;Driving;Occupation    Stability/Clinical Decision Making Evolving/Moderate complexity    Clinical Decision Making Moderate    Rehab Potential Good    PT Frequency 2x / week    PT Duration 8 weeks    PT Treatment/Interventions ADLs/Self Care Home Management;Cryotherapy;Electrical Stimulation;Iontophoresis 4mg /ml Dexamethasone;Moist Heat;Traction;Ultrasound;Therapeutic activities;Therapeutic exercise;Neuromuscular re-education;Patient/family education;Manual techniques;Dry needling;Passive range of motion;Joint Manipulations;Spinal Manipulations;Vasopneumatic Device;Taping    PT Next Visit Plan how was HEP, focus on strength, modalaties for pain PRN    PT Home Exercise Plan Access Code: [email or web address]    Consulted and Agree with Plan of Care Patient           Patient will benefit from skilled therapeutic intervention in order to improve the following deficits and  impairments:  Decreased activity tolerance, Decreased range of motion, Decreased strength, Pain  Visit Diagnosis: Acute pain of right shoulder  Muscle weakness (generalized)     Problem List Patient Active Problem List   Diagnosis Date Noted  . Angioedema 01/21/2019  . Vertigo 09/28/2016  . Hyperlipidemia 09/25/2011  . Hypertension     [name] 03/14/2020, 3:10 PM  [facility] Physical Therapy [address] [place], [place], [address] Phone: [phone]   Fax:  [phone]  Name: Joanna Ray MRN: [id number] Date of Birth: [birth year]

## 2020-03-18 ENCOUNTER — Other Ambulatory Visit: Payer: Medicare Other | Admitting: Internal Medicine

## 2020-03-18 ENCOUNTER — Other Ambulatory Visit: Payer: Self-pay

## 2020-03-18 DIAGNOSIS — R7989 Other specified abnormal findings of blood chemistry: Secondary | ICD-10-CM

## 2020-03-19 ENCOUNTER — Encounter: Payer: Self-pay | Admitting: Internal Medicine

## 2020-03-19 ENCOUNTER — Encounter: Payer: Self-pay | Admitting: Rehabilitative and Restorative Service Providers"

## 2020-03-19 ENCOUNTER — Ambulatory Visit (INDEPENDENT_AMBULATORY_CARE_PROVIDER_SITE_OTHER): Payer: Medicare Other | Admitting: Rehabilitative and Restorative Service Providers"

## 2020-03-19 ENCOUNTER — Ambulatory Visit (INDEPENDENT_AMBULATORY_CARE_PROVIDER_SITE_OTHER): Payer: Medicare Other | Admitting: Internal Medicine

## 2020-03-19 VITALS — BP 140/70 | HR 76 | Ht 65.0 in | Wt 199.0 lb

## 2020-03-19 DIAGNOSIS — I10 Essential (primary) hypertension: Secondary | ICD-10-CM | POA: Diagnosis not present

## 2020-03-19 DIAGNOSIS — M25511 Pain in right shoulder: Secondary | ICD-10-CM

## 2020-03-19 DIAGNOSIS — R7989 Other specified abnormal findings of blood chemistry: Secondary | ICD-10-CM | POA: Diagnosis not present

## 2020-03-19 DIAGNOSIS — M6281 Muscle weakness (generalized): Secondary | ICD-10-CM | POA: Diagnosis not present

## 2020-03-19 LAB — BASIC METABOLIC PANEL
BUN/Creatinine Ratio: 15 (calc) (ref 6–22)
BUN: 16 mg/dL (ref 7–25)
CO2: 31 mmol/L (ref 20–32)
Calcium: 9.7 mg/dL (ref 8.6–10.4)
Chloride: 101 mmol/L (ref 98–110)
Creat: 1.04 mg/dL — ABNORMAL HIGH (ref 0.50–0.99)
Glucose, Bld: 95 mg/dL (ref 65–99)
Potassium: 3.9 mmol/L (ref 3.5–5.3)
Sodium: 141 mmol/L (ref 135–146)

## 2020-03-19 NOTE — Progress Notes (Signed)
   Subjective:    Patient ID: Joanna Ray, female    DOB: 1951-03-13, 69 y.o.   MRN: [id number]  HPI 69 year old Female to follow up appointment.  She was here November 9 for 84-month follow-up on essential hypertension and hyperlipidemia.  She is on statin medication.  Creatinine was 1.13 in May.  On November 8 , was 1.29.  It was felt that olmesartan may be causing increased creatinine and this was discontinued.  She is now here for follow-up.  Creatinine has now improved from 1.29 to 1.04.      Review of Systems no new complaints     Objective:   Physical Exam Blood pressure 140/70 BMI 33.12 pulse 76 regular pulse oximetry 98% weight 199 pounds height 5 feet 5 inches  Skin warm and dry.  Chest clear to auscultation.  Cardiac exam regular rate and rhythm normal S1 and S2.  Extremities without edema.       Assessment & Plan:  Elevated serum creatinine improved after stopping olmesartan.  Creatinine is now stable.  Follow-up December 28 and see how she is doing with blood pressure and serum creatinine will be drawn.

## 2020-03-19 NOTE — Therapy (Signed)
[facility] Physical Therapy [address] [place], [place], [address] Phone: [phone]   Fax:  [phone]  Physical Therapy Treatment  Patient Details  Name: RIDA LOUDIN MRN: [id number] Date of Birth: 07/08/1950 Referring Provider (PT): Eunice Blase, MD   Encounter Date: 03/19/2020   PT End of Session - 03/19/20 1531    Visit Number 2    Number of Visits 12    Date for PT Re-Evaluation 05/09/20    Authorization Type MCR    PT Start Time 1346    PT Stop Time 1429    PT Time Calculation (min) 43 min    Activity Tolerance Patient limited by pain    Behavior During Therapy [facility] for tasks assessed/performed           Past Medical History:  Diagnosis Date  . Anxiety   . GERD (gastroesophageal reflux disease)   . Hyperlipidemia   . Hypertension   . Urticaria     Past Surgical History:  Procedure Laterality Date  . CATARACT EXTRACTION, BILATERAL Bilateral   . DILATION AND CURETTAGE OF UTERUS      There were no vitals filed for this visit.   Subjective Assessment - 03/19/20 1528    Subjective Akeisha reports early HEP compliance.  She notes some progress already.    Diagnostic tests Diagnostic ultrasound right shoulder: Long head biceps tendon is located in its groove.  Subscapularis tendon looks normal.  Supraspinatus has tendinopathy changes but no high-grade tear.  Infraspinatus tendon looks intact.  There is thickening of the subacromial/subdeltoid bursa, no hyperemia on power Doppler imaging.    Patient Stated Goals reduce pain and improve funciton of her Rt arm    Currently in Pain? No/denies    Pain Onset More than a month ago              [facility] Main PT Assessment - 03/19/20 0001      AROM   Right Shoulder Flexion 130 Degrees    Right Shoulder Internal Rotation 90 Degrees    Right Shoulder External Rotation 100 Degrees    Right Shoulder Horizontal  ADduction 35 Degrees                         [facility] Adult PT  Treatment/Exercise - 03/19/20 0001      Exercises   Exercises Shoulder      Shoulder Exercises: Supine   Protraction AAROM;Right;10 reps   2 sets     Shoulder Exercises: Sidelying   External Rotation Strengthening;Right;10 reps   2 sets     Shoulder Exercises: Standing   External Rotation Strengthening;Right;10 reps;Theraband   slow eccentrics and limited range   Theraband Level (Shoulder External Rotation) Level 2 (Red)    Internal Rotation Strengthening;Right;10 reps;Theraband   slow eccentrics limited range   Theraband Level (Shoulder Internal Rotation) Level 2 (Red)    Retraction Strengthening;Both;10 reps   5 seconds shoulder blade pinches     Shoulder Exercises: Isometric Strengthening   Flexion Other (comment)   10X 5 seconds   External Rotation Other (comment)   10X 5 seconds   Internal Rotation Other (comment)   10X 5 seconds   ABduction Other (comment)   10X 5 seconds                 PT Education - 03/19/20 1530    Education Details Reviewed HEP with progressions for scapular and RTC strength.    Person(s) Educated Patient  Methods Explanation;Demonstration;Tactile cues;Verbal cues;Handout    Comprehension Verbal cues required;Need further instruction;Returned demonstration;Tactile cues required;Verbalized understanding            PT Short Term Goals - 03/19/20 1531      PT SHORT TERM GOAL #1   Title Pt will be I and compliant with HEP    Time 4    Period Weeks    Status On-going    Target Date 04/11/20      PT SHORT TERM GOAL #2   Title Pt will improve FOTO 5%    Time 4    Period Weeks    Status On-going             PT Long Term Goals - 03/19/20 1531      PT LONG TERM GOAL #1   Title Pt will improve FOTO to 62% functional    Time 8    Period Weeks    Status On-going      PT LONG TERM GOAL #2   Title Pt will improve Rt shoulder strength to 4+ all planes    Time 8    Period Weeks    Status On-going      PT LONG TERM GOAL #3    Title Pt will improve Rt shoulder AROM to [facility]    Time 8    Period Weeks    Status On-going      PT LONG TERM GOAL #4   Title Pt will reduce overall pain to 4 or less    Time 8    Period Weeks    Status On-going                 Plan - 03/19/20 1532    Clinical Impression Statement Yazmine notes early progress in her strength from day 1 of PT.  Scapular and RTC strength was progressed today.  Continue strength emphasis to meet long-term goals.    Examination-Activity Limitations Carry;Dressing;Hygiene/Grooming;Sleep;Reach Overhead    Examination-Participation Restrictions Cleaning;Driving;Occupation    Stability/Clinical Decision Making Evolving/Moderate complexity    Rehab Potential Good    PT Frequency 2x / week    PT Duration 8 weeks    PT Treatment/Interventions ADLs/Self Care Home Management;Cryotherapy;Electrical Stimulation;Iontophoresis 4mg /ml Dexamethasone;Moist Heat;Traction;Ultrasound;Therapeutic activities;Therapeutic exercise;Neuromuscular re-education;Patient/family education;Manual techniques;Dry needling;Passive range of motion;Joint Manipulations;Spinal Manipulations;Vasopneumatic Device;Taping    PT Next Visit Plan How was HEP, focus on strength, modalaties for pain PRN    PT Home Exercise Plan Access Code: [email or web address]    Consulted and Agree with Plan of Care Patient           Patient will benefit from skilled therapeutic intervention in order to improve the following deficits and impairments:  Decreased activity tolerance, Decreased range of motion, Decreased strength, Pain  Visit Diagnosis: Muscle weakness (generalized)  Acute pain of right shoulder     Problem List Patient Active Problem List   Diagnosis Date Noted  . Angioedema 01/21/2019  . Vertigo 09/28/2016  . Hyperlipidemia 09/25/2011  . Hypertension     Farley Ly PT, MPT 03/19/2020, 3:34 PM  [facility] Physical Therapy [address] [place], [place],  [address] Phone: [phone]   Fax:  [phone]  Name: TEAIRA CROFT MRN: [id number] Date of Birth: 06-18-1950

## 2020-03-24 NOTE — Progress Notes (Signed)
   Subjective:    Patient ID: Joanna Ray, female    DOB: 08/09/1950, 69 y.o.   MRN: [id number]  HPI 69 year old Female in today for 27-month recheck.  History of essential hypertension and hyperlipidemia.  She is on statin medication consisting of Crestor 20 mg daily.  Her creatinine was elevated today at 1.140 and is now 1.29.  Olmesartan may be causing increased creatinine.  She will discontinue this.  We will follow-up in 2 weeks.  She will take HCTZ 25 mg daily and amlodipine 5 mg daily.  She has a history of anxiety and takes Xanax twice daily as needed.  She is also on Lexapro 10 mg daily.    Review of Systems see above-no new complaints and generally feels well     Objective:   Physical Exam Blood pressure 120/70 pulse 88 pulse oximetry 96% weight 202 pounds height 5 feet 5 inches BMI 33.61  Skin warm and dry.  Nodes none.  Neck is supple without JVD thyromegaly or carotid bruits.  Chest clear to auscultation.  Cardiac exam regular rate and rhythm.  No lower extremity pitting edema.       Assessment & Plan:  Essential hypertension-under good control currently however serum creatinine is elevated likely due to olmesartan.  She has not had elevated serum creatinine in the past.  Recommend discontinue olmesartan.  She will take HCTZ and Norvasc 5 mg daily and follow-up in 2 weeks.  Hyperlipidemia-stable with statin medication.  Lipid panel and liver functions are normal.  Anxiety/depression treated with Xanax and Lexapro  Plan: Return in 2 weeks for follow-up on elevated serum creatinine.  Olmesartan has been discontinued.  CPE booked for May 2022.

## 2020-03-25 ENCOUNTER — Encounter: Payer: Medicare Other | Admitting: Physical Therapy

## 2020-03-26 DIAGNOSIS — Z23 Encounter for immunization: Secondary | ICD-10-CM | POA: Diagnosis not present

## 2020-03-28 ENCOUNTER — Ambulatory Visit (INDEPENDENT_AMBULATORY_CARE_PROVIDER_SITE_OTHER): Payer: Medicare Other | Admitting: Rehabilitative and Restorative Service Providers"

## 2020-03-28 ENCOUNTER — Encounter: Payer: Self-pay | Admitting: Rehabilitative and Restorative Service Providers"

## 2020-03-28 ENCOUNTER — Other Ambulatory Visit: Payer: Self-pay

## 2020-03-28 DIAGNOSIS — M25511 Pain in right shoulder: Secondary | ICD-10-CM | POA: Diagnosis not present

## 2020-03-28 DIAGNOSIS — M6281 Muscle weakness (generalized): Secondary | ICD-10-CM | POA: Diagnosis not present

## 2020-03-28 NOTE — Therapy (Signed)
[facility] Physical Therapy [address] [place], [place], [address] Phone: [phone]   Fax:  [phone]  Physical Therapy Treatment  Patient Details  Name: Joanna Ray MRN: [id number] Date of Birth: [birth year] Referring Provider (PT): Eunice Blase, MD   Encounter Date: 03/28/2020   PT End of Session - 03/28/20 1524    Visit Number 3    Number of Visits 12    Date for PT Re-Evaluation 05/09/20    Authorization Type MCR    PT Start Time 0093    PT Stop Time 1430    PT Time Calculation (min) 45 min    Activity Tolerance No increased pain;Patient tolerated treatment well    Behavior During Therapy [facility] for tasks assessed/performed           Past Medical History:  Diagnosis Date  . Anxiety   . GERD (gastroesophageal reflux disease)   . Hyperlipidemia   . Hypertension   . Urticaria     Past Surgical History:  Procedure Laterality Date  . CATARACT EXTRACTION, BILATERAL Bilateral   . DILATION AND CURETTAGE OF UTERUS      There were no vitals filed for this visit.   Subjective Assessment - 03/28/20 1422    Subjective Joanna Ray notes significant improvements in her AROM and strength.    Diagnostic tests Diagnostic ultrasound right shoulder: Long head biceps tendon is located in its groove.  Subscapularis tendon looks normal.  Supraspinatus has tendinopathy changes but no high-grade tear.  Infraspinatus tendon looks intact.  There is thickening of the subacromial/subdeltoid bursa, no hyperemia on power Doppler imaging.    Patient Stated Goals reduce pain and improve funciton of her Rt arm    Currently in Pain? Yes    Pain Score 5     Pain Location Shoulder    Pain Orientation Right    Pain Descriptors / Indicators Aching    Pain Type Chronic pain    Pain Radiating Towards Shoulder to elbow    Pain Onset More than a month ago    Pain Frequency Intermittent    Aggravating Factors  R UE use    Pain Relieving Factors Rest and change of  position    Effect of Pain on Daily Activities Limits R UE use with reaching and unable to reach overhead or behind the back    Multiple Pain Sites No                             [facility] Adult PT Treatment/Exercise - 03/28/20 0001      Exercises   Exercises Shoulder      Shoulder Exercises: Supine   Protraction AAROM;Right;20 reps   2 sets 0# and 1#, respectively     Shoulder Exercises: Sidelying   External Rotation Strengthening;Right;10 reps   2 sets 0# and 1#, respectively     Shoulder Exercises: Standing   External Rotation Strengthening;Right;Theraband;10 reps   slow eccentrics and 1/2 range   Theraband Level (Shoulder External Rotation) Level 2 (Red)    Internal Rotation Strengthening;Right;10 reps;Theraband   slow eccentrics full range   Theraband Level (Shoulder Internal Rotation) Level 2 (Red)    Row Strengthening;Both;20 reps;Theraband    Theraband Level (Shoulder Row) Level 4 (Blue)    Retraction Strengthening;Both;10 reps   5 seconds shoulder blade pinches     Shoulder Exercises: Pulleys   Flexion 2 minutes;Other (comment)   Protract 1st    Scaption 2 minutes;Other (comment)  Protract 1st     Shoulder Exercises: ROM/Strengthening   UBE (Upper Arm Bike) 8 minutes (:15 push/:15 rest/:15 pull/:15 rest)      Shoulder Exercises: Isometric Strengthening   External Rotation Other (comment)   10X 5 seconds   Internal Rotation Other (comment)   10X 5 seconds                 PT Education - 03/28/20 1523    Education Details Reviewed and progressed HEP.    Person(s) Educated Patient    Methods Explanation;Demonstration;Verbal cues    Comprehension Returned demonstration;Need further instruction;Verbal cues required;Verbalized understanding            PT Short Term Goals - 03/28/20 1523      PT SHORT TERM GOAL #1   Title Pt will be I and compliant with HEP    Time 4    Period Weeks    Status On-going    Target Date 04/11/20      PT  SHORT TERM GOAL #2   Title Pt will improve FOTO 5%    Time 4    Period Weeks    Status On-going             PT Long Term Goals - 03/28/20 1523      PT LONG TERM GOAL #1   Title Pt will improve FOTO to 62% functional    Time 8    Period Weeks    Status On-going      PT LONG TERM GOAL #2   Title Pt will improve Rt shoulder strength to 4+ all planes    Time 8    Period Weeks    Status On-going      PT LONG TERM GOAL #3   Title Pt will improve Rt shoulder AROM to [facility]    Time 8    Period Weeks    Status On-going      PT LONG TERM GOAL #4   Title Pt will reduce overall pain to 4 or less    Time 8    Period Weeks    Status On-going                 Plan - 03/28/20 1524    Clinical Impression Statement Joanna Ray notes improvement in her strength since starting PT.  R UE function is still very limited, although significant gains are noted with her functional reach and HEP.  Continue scapular emphasis with RTC strength progressions as appropriate.    Examination-Activity Limitations Carry;Dressing;Hygiene/Grooming;Sleep;Reach Overhead    Examination-Participation Restrictions Cleaning;Driving;Occupation    Stability/Clinical Decision Making Evolving/Moderate complexity    Rehab Potential Good    PT Frequency 2x / week    PT Duration 8 weeks    PT Treatment/Interventions ADLs/Self Care Home Management;Cryotherapy;Electrical Stimulation;Iontophoresis 4mg /ml Dexamethasone;Moist Heat;Traction;Ultrasound;Therapeutic activities;Therapeutic exercise;Neuromuscular re-education;Patient/family education;Manual techniques;Dry needling;Passive range of motion;Joint Manipulations;Spinal Manipulations;Vasopneumatic Device;Taping    PT Next Visit Plan Scapular and RTC strength progressions    PT Home Exercise Plan Access Code: [email or web address]    Consulted and Agree with Plan of Care Patient           Patient will benefit from skilled therapeutic intervention in order to improve the  following deficits and impairments:  Decreased activity tolerance, Decreased range of motion, Decreased strength, Pain  Visit Diagnosis: Muscle weakness (generalized)  Right shoulder pain, unspecified chronicity     Problem List Patient Active Problem List   Diagnosis Date Noted  . Angioedema 01/21/2019  .  Vertigo 09/28/2016  . Hyperlipidemia 09/25/2011  . Hypertension     Farley Ly PT, MPT 03/28/2020, 3:27 PM  [facility] Physical Therapy [address] [place], [place], [address] Phone: [phone]   Fax:  [phone]  Name: Joanna Ray MRN: [id number] Date of Birth: February 23, 1951

## 2020-04-01 ENCOUNTER — Other Ambulatory Visit: Payer: Self-pay | Admitting: Internal Medicine

## 2020-04-02 ENCOUNTER — Ambulatory Visit (INDEPENDENT_AMBULATORY_CARE_PROVIDER_SITE_OTHER): Payer: Medicare Other | Admitting: Physical Therapy

## 2020-04-02 ENCOUNTER — Other Ambulatory Visit: Payer: Self-pay

## 2020-04-02 DIAGNOSIS — R600 Localized edema: Secondary | ICD-10-CM

## 2020-04-02 DIAGNOSIS — M6281 Muscle weakness (generalized): Secondary | ICD-10-CM | POA: Diagnosis not present

## 2020-04-02 DIAGNOSIS — M25511 Pain in right shoulder: Secondary | ICD-10-CM

## 2020-04-02 NOTE — Therapy (Signed)
[facility] Physical Therapy [address] [place], [place], [address] Phone: [phone]   Fax:  [phone]  Physical Therapy Treatment  Patient Details  Name: Joanna Ray MRN: [id number] Date of Birth: Jul 18, 1950 Referring Provider (PT): Eunice Blase, MD   Encounter Date: 04/02/2020   PT End of Session - 04/02/20 0944    Visit Number 4    Number of Visits 12    Date for PT Re-Evaluation 05/09/20    Authorization Type MCR    PT Start Time 0848    PT Stop Time 0936    PT Time Calculation (min) 48 min    Activity Tolerance No increased pain;Patient tolerated treatment well    Behavior During Therapy [facility] for tasks assessed/performed           Past Medical History:  Diagnosis Date  . Anxiety   . GERD (gastroesophageal reflux disease)   . Hyperlipidemia   . Hypertension   . Urticaria     Past Surgical History:  Procedure Laterality Date  . CATARACT EXTRACTION, BILATERAL Bilateral   . DILATION AND CURETTAGE OF UTERUS      There were no vitals filed for this visit.   Subjective Assessment - 04/02/20 0942    Subjective relays she can see some progress, the exercises at home lying down are getting easier. Pain is about 5/10 overall but she still has difficulty trying to lift her arm in standing    Diagnostic tests Diagnostic ultrasound right shoulder: Long head biceps tendon is located in its groove.  Subscapularis tendon looks normal.  Supraspinatus has tendinopathy changes but no high-grade tear.  Infraspinatus tendon looks intact.  There is thickening of the subacromial/subdeltoid bursa, no hyperemia on power Doppler imaging.    Patient Stated Goals reduce pain and improve funciton of her Rt arm    Pain Onset More than a month ago              [facility] PT Assessment - 04/02/20 0001      Assessment   Medical Diagnosis M25.511 (ICD-10-CM) - Acute pain of right shoulder    Referring Provider (PT) Hilts, Michael, MD    Next MD Visit PRN        AROM   Overall AROM Comments in standing    Right Shoulder Flexion 50 Degrees    Right Shoulder ABduction 50 Degrees    Right Shoulder Internal Rotation --   can not clear iliac crest reaching behind back   Right Shoulder External Rotation --   to ear            [facility] Adult PT Treatment/Exercise - 04/02/20 0001      Shoulder Exercises: Supine   Flexion AAROM;10 reps    ABduction Limitations states this is too painful for her      Shoulder Exercises: Sidelying   External Rotation Right;10 reps    External Rotation Limitations 2 sets, 3 sec hold at top    Flexion Right;10 reps    Flexion Limitations with PT assist    ABduction Right;10 reps    ABduction Limitations with PT assist      Shoulder Exercises: Standing   Other Standing Exercises wall ladder flexion and scaption X 10       Shoulder Exercises: Pulleys   Flexion 2 minutes    ABduction 2 minutes      Shoulder Exercises: ROM/Strengthening   UBE (Upper Arm Bike) L3 3 min fwd, 3 min retro    Ranger 10 reps  flexion, 10 circles      Shoulder Exercises: Isometric Strengthening   Flexion Limitations 5 sec X 10 reps    External Rotation Limitations 5 sec X 10 reps    ABduction Limitations 5 sec X 10 reps      Modalities   Modalities Vasopneumatic      Vasopneumatic   Number Minutes Vasopneumatic  5 minutes    Vasopnuematic Location  Shoulder    Vasopneumatic Pressure Medium    Vasopneumatic Temperature  34                    PT Short Term Goals - 03/28/20 1523      PT SHORT TERM GOAL #1   Title Pt will be I and compliant with HEP    Time 4    Period Weeks    Status On-going    Target Date 04/11/20      PT SHORT TERM GOAL #2   Title Pt will improve FOTO 5%    Time 4    Period Weeks    Status On-going             PT Long Term Goals - 03/28/20 1523      PT LONG TERM GOAL #1   Title Pt will improve FOTO to 62% functional    Time 8    Period Weeks    Status On-going      PT LONG  TERM GOAL #2   Title Pt will improve Rt shoulder strength to 4+ all planes    Time 8    Period Weeks    Status On-going      PT LONG TERM GOAL #3   Title Pt will improve Rt shoulder AROM to [facility]    Time 8    Period Weeks    Status On-going      PT LONG TERM GOAL #4   Title Pt will reduce overall pain to 4 or less    Time 8    Period Weeks    Status On-going                 Plan - 04/02/20 0945    Clinical Impression Statement She is having less pain and had small improvment in AROM but this is still very limited due to weakness and PT still concerned about nerve damage limiting her muscle recruitment into shoulder flexion and abd. Continued to work on [place] with addition of PT manual assistance into flexion and abduction. Her PROM is doing well.    Examination-Activity Limitations Carry;Dressing;Hygiene/Grooming;Sleep;Reach Overhead    Examination-Participation Restrictions Cleaning;Driving;Occupation    Stability/Clinical Decision Making Evolving/Moderate complexity    Rehab Potential Good    PT Frequency 2x / week    PT Duration 8 weeks    PT Treatment/Interventions ADLs/Self Care Home Management;Cryotherapy;Electrical Stimulation;Iontophoresis 4mg /ml Dexamethasone;Moist Heat;Traction;Ultrasound;Therapeutic activities;Therapeutic exercise;Neuromuscular re-education;Patient/family education;Manual techniques;Dry needling;Passive range of motion;Joint Manipulations;Spinal Manipulations;Vasopneumatic Device;Taping    PT Next Visit Plan flexion, abd, Scapular and RTC strength progressions    PT Home Exercise Plan Access Code: [email or web address]    Consulted and Agree with Plan of Care Patient           Patient will benefit from skilled therapeutic intervention in order to improve the following deficits and impairments:  Decreased activity tolerance, Decreased range of motion, Decreased strength, Pain  Visit Diagnosis: Muscle weakness (generalized)  Acute pain of  right shoulder  Localized edema     Problem List Patient Active  Problem List   Diagnosis Date Noted  . Angioedema 01/21/2019  . Vertigo 09/28/2016  . Hyperlipidemia 09/25/2011  . Hypertension     Debbe Odea ,PT,DPT 04/02/2020, 10:08 AM  [facility] Physical Therapy [address] [place], [place], [address] Phone: [phone]   Fax:  [phone]  Name: SHRILEY JOFFE MRN: [id number] Date of Birth: 01/24/1951

## 2020-04-05 ENCOUNTER — Encounter: Payer: Medicare Other | Admitting: Physical Therapy

## 2020-04-09 ENCOUNTER — Other Ambulatory Visit: Payer: Self-pay

## 2020-04-09 ENCOUNTER — Ambulatory Visit (INDEPENDENT_AMBULATORY_CARE_PROVIDER_SITE_OTHER): Payer: Medicare Other | Admitting: Physical Therapy

## 2020-04-09 ENCOUNTER — Telehealth: Payer: Self-pay | Admitting: Internal Medicine

## 2020-04-09 DIAGNOSIS — R600 Localized edema: Secondary | ICD-10-CM | POA: Diagnosis not present

## 2020-04-09 DIAGNOSIS — M6281 Muscle weakness (generalized): Secondary | ICD-10-CM

## 2020-04-09 DIAGNOSIS — M25511 Pain in right shoulder: Secondary | ICD-10-CM

## 2020-04-09 MED ORDER — TRIAMTERENE-HCTZ 37.5-25 MG PO TABS: 1.0000 | ORAL_TABLET | Freq: Every day | ORAL | 0 refills | Status: DC

## 2020-04-09 NOTE — Telephone Encounter (Signed)
Informed patient of medication change and follow up appointment scheduled.

## 2020-04-09 NOTE — Telephone Encounter (Signed)
Joanna Ray [phone]  [name] called to say that her ankles are swelling, she said you said that might happen when she started taking the amlodipine. She wanted to know if she should come in for an appointment.

## 2020-04-09 NOTE — Telephone Encounter (Signed)
Discontinue HCTZ. Try Maxzide 25 daily and follow up in 2 weeks.

## 2020-04-09 NOTE — Therapy (Signed)
[facility] Physical Therapy [address] [place], [place], [address] Phone: [phone]   Fax:  [phone]  Physical Therapy Treatment  Patient Details  Name: Joanna Ray MRN: [id number] Date of Birth: [birth year] Referring Provider (PT): Eunice Blase, MD   Encounter Date: 04/09/2020   PT End of Session - 04/09/20 1508    Visit Number 5    Number of Visits 12    Date for PT Re-Evaluation 05/09/20    Authorization Type MCR    PT Start Time 1062    PT Stop Time 1516    PT Time Calculation (min) 42 min    Activity Tolerance No increased pain;Patient tolerated treatment well    Behavior During Therapy [facility] for tasks assessed/performed           Past Medical History:  Diagnosis Date  . Anxiety   . GERD (gastroesophageal reflux disease)   . Hyperlipidemia   . Hypertension   . Urticaria     Past Surgical History:  Procedure Laterality Date  . CATARACT EXTRACTION, BILATERAL Bilateral   . DILATION AND CURETTAGE OF UTERUS      There were no vitals filed for this visit.   Subjective Assessment - 04/09/20 1454    Subjective relays the pain is overall better and she can feels she can lift her arm a litte better now but its still pretty limited    Diagnostic tests Diagnostic ultrasound right shoulder: Long head biceps tendon is located in its groove.  Subscapularis tendon looks normal.  Supraspinatus has tendinopathy changes but no high-grade tear.  Infraspinatus tendon looks intact.  There is thickening of the subacromial/subdeltoid bursa, no hyperemia on power Doppler imaging.    Patient Stated Goals reduce pain and improve funciton of her Rt arm    Pain Onset More than a month ago            [facility] Adult PT Treatment/Exercise - 04/09/20 0001      Shoulder Exercises: Supine   Flexion AROM;15 reps;Right    Flexion Limitations has to bend elbow, cant yet perform with straight elbow due to weakness      Shoulder Exercises: Sidelying    External Rotation Right;10 reps   2   External Rotation Limitations 2 sets, 3 sec hold at top      Shoulder Exercises: Standing   Other Standing Exercises wall ladder flexion and scaption X 10     Other Standing Exercises bent over shoulder flexion and abd      Shoulder Exercises: Pulleys   Flexion 3 minutes    ABduction 3 minutes      Shoulder Exercises: ROM/Strengthening   Ranger 10 reps flexion, 10 circles      Shoulder Exercises: Isometric Strengthening   Flexion Limitations 5 sec X 10 reps    ABduction Limitations 5 sec X 10 reps      Vasopneumatic   Number Minutes Vasopneumatic  10 minutes    Vasopnuematic Location  Shoulder    Vasopneumatic Pressure High    Vasopneumatic Temperature  34                    PT Short Term Goals - 03/28/20 1523      PT SHORT TERM GOAL #1   Title Pt will be I and compliant with HEP    Time 4    Period Weeks    Status On-going    Target Date 04/11/20      PT SHORT TERM GOAL #  2   Title Pt will improve FOTO 5%    Time 4    Period Weeks    Status On-going             PT Long Term Goals - 03/28/20 1523      PT LONG TERM GOAL #1   Title Pt will improve FOTO to 62% functional    Time 8    Period Weeks    Status On-going      PT LONG TERM GOAL #2   Title Pt will improve Rt shoulder strength to 4+ all planes    Time 8    Period Weeks    Status On-going      PT LONG TERM GOAL #3   Title Pt will improve Rt shoulder AROM to [facility]    Time 8    Period Weeks    Status On-going      PT LONG TERM GOAL #4   Title Pt will reduce overall pain to 4 or less    Time 8    Period Weeks    Status On-going                 Plan - 04/09/20 1509    Clinical Impression Statement Made small improvements in ability to lift her arm into flexion however still continues to be limited with this due to weakness. PT will continue to progress this as able.    Examination-Activity Limitations  Carry;Dressing;Hygiene/Grooming;Sleep;Reach Overhead    Examination-Participation Restrictions Cleaning;Driving;Occupation    Stability/Clinical Decision Making Evolving/Moderate complexity    Rehab Potential Good    PT Frequency 2x / week    PT Duration 8 weeks    PT Treatment/Interventions ADLs/Self Care Home Management;Cryotherapy;Electrical Stimulation;Iontophoresis 4mg /ml Dexamethasone;Moist Heat;Traction;Ultrasound;Therapeutic activities;Therapeutic exercise;Neuromuscular re-education;Patient/family education;Manual techniques;Dry needling;Passive range of motion;Joint Manipulations;Spinal Manipulations;Vasopneumatic Device;Taping    PT Next Visit Plan flexion, abd, Scapular and RTC strength progressions    PT Home Exercise Plan Access Code: [email or web address]    Consulted and Agree with Plan of Care Patient           Patient will benefit from skilled therapeutic intervention in order to improve the following deficits and impairments:  Decreased activity tolerance,Decreased range of motion,Decreased strength,Pain  Visit Diagnosis: Muscle weakness (generalized)  Acute pain of right shoulder  Localized edema  Right shoulder pain, unspecified chronicity     Problem List Patient Active Problem List   Diagnosis Date Noted  . Angioedema 01/21/2019  . Vertigo 09/28/2016  . Hyperlipidemia 09/25/2011  . Hypertension     [name] 04/09/2020, 3:11 PM  [facility] Physical Therapy [address] [place], [place], [address] Phone: [phone]   Fax:  [phone]  Name: Joanna Ray MRN: [id number] Date of Birth: 27-Jan-1951

## 2020-04-11 ENCOUNTER — Ambulatory Visit: Payer: Medicare Other | Admitting: Allergy

## 2020-04-11 ENCOUNTER — Other Ambulatory Visit: Payer: Self-pay

## 2020-04-11 ENCOUNTER — Ambulatory Visit (INDEPENDENT_AMBULATORY_CARE_PROVIDER_SITE_OTHER): Payer: Medicare Other | Admitting: Physical Therapy

## 2020-04-11 DIAGNOSIS — M25511 Pain in right shoulder: Secondary | ICD-10-CM

## 2020-04-11 DIAGNOSIS — M6281 Muscle weakness (generalized): Secondary | ICD-10-CM

## 2020-04-11 DIAGNOSIS — R600 Localized edema: Secondary | ICD-10-CM

## 2020-04-11 NOTE — Therapy (Signed)
[facility] Physical Therapy [address] [place], [place], [address] Phone: [phone]   Fax:  [phone]  Physical Therapy Treatment  Patient Details  Name: Joanna Ray MRN: [id number] Date of Birth: Jun 08, 1950 Referring Provider (PT): Eunice Blase, MD   Encounter Date: 04/11/2020   PT End of Session - 04/11/20 1457    Visit Number 6    Number of Visits 12    Date for PT Re-Evaluation 05/09/20    Authorization Type MCR    PT Start Time 1430    PT Stop Time 1510    PT Time Calculation (min) 40 min    Activity Tolerance No increased pain;Patient tolerated treatment well    Behavior During Therapy [facility] for tasks assessed/performed           Past Medical History:  Diagnosis Date  . Anxiety   . GERD (gastroesophageal reflux disease)   . Hyperlipidemia   . Hypertension   . Urticaria     Past Surgical History:  Procedure Laterality Date  . CATARACT EXTRACTION, BILATERAL Bilateral   . DILATION AND CURETTAGE OF UTERUS      There were no vitals filed for this visit.   Subjective Assessment - 04/11/20 1437    Subjective relays 8/10 soreness in her Rt arm today.    Diagnostic tests Diagnostic ultrasound right shoulder: Long head biceps tendon is located in its groove.  Subscapularis tendon looks normal.  Supraspinatus has tendinopathy changes but no high-grade tear.  Infraspinatus tendon looks intact.  There is thickening of the subacromial/subdeltoid bursa, no hyperemia on power Doppler imaging.    Patient Stated Goals reduce pain and improve funciton of her Rt arm    Pain Onset More than a month ago             [facility] Adult PT Treatment/Exercise - 04/11/20 0001      Shoulder Exercises: Supine   Flexion AROM;Right;10 reps    Flexion Limitations has to bend elbow, cant yet perform with straight elbow due to weakness      Shoulder Exercises: Sidelying   External Rotation Right;15 reps   2   External Rotation Limitations --     ABduction Right;15 reps    ABduction Limitations with PT assist      Shoulder Exercises: Standing   Other Standing Exercises wall ladder flexion and scaption X 10     Other Standing Exercises pendulums circles, A-P, lateral X 15 ea      Shoulder Exercises: Pulleys   Flexion 2 minutes    ABduction 2 minutes      Shoulder Exercises: ROM/Strengthening   UBE (Upper Arm Bike) no resistance 2 min fwd, 2 min retro    Ranger 10 reps flexion, 10 circles      Shoulder Exercises: Isometric Strengthening   Flexion Limitations --    ABduction Limitations --      Vasopneumatic   Number Minutes Vasopneumatic  10 minutes    Vasopnuematic Location  Shoulder    Vasopneumatic Pressure High    Vasopneumatic Temperature  34                    PT Short Term Goals - 03/28/20 1523      PT SHORT TERM GOAL #1   Title Pt will be I and compliant with HEP    Time 4    Period Weeks    Status On-going    Target Date 04/11/20      PT SHORT TERM  GOAL #2   Title Pt will improve FOTO 5%    Time 4    Period Weeks    Status On-going             PT Long Term Goals - 03/28/20 1523      PT LONG TERM GOAL #1   Title Pt will improve FOTO to 62% functional    Time 8    Period Weeks    Status On-going      PT LONG TERM GOAL #2   Title Pt will improve Rt shoulder strength to 4+ all planes    Time 8    Period Weeks    Status On-going      PT LONG TERM GOAL #3   Title Pt will improve Rt shoulder AROM to [facility]    Time 8    Period Weeks    Status On-going      PT LONG TERM GOAL #4   Title Pt will reduce overall pain to 4 or less    Time 8    Period Weeks    Status On-going                 Plan - 04/11/20 1459    Clinical Impression Statement She had a lot of soreness so backed down some on overall activity with todays session. PT will attempt to progress back when soreness resolves some.    Examination-Activity Limitations Carry;Dressing;Hygiene/Grooming;Sleep;Reach  Overhead    Examination-Participation Restrictions Cleaning;Driving;Occupation    Stability/Clinical Decision Making Evolving/Moderate complexity    Rehab Potential Good    PT Frequency 2x / week    PT Duration 8 weeks    PT Treatment/Interventions ADLs/Self Care Home Management;Cryotherapy;Electrical Stimulation;Iontophoresis 4mg /ml Dexamethasone;Moist Heat;Traction;Ultrasound;Therapeutic activities;Therapeutic exercise;Neuromuscular re-education;Patient/family education;Manual techniques;Dry needling;Passive range of motion;Joint Manipulations;Spinal Manipulations;Vasopneumatic Device;Taping    PT Next Visit Plan flexion, abd, Scapular and RTC strength progressions as able    PT Home Exercise Plan Access Code: [email or web address]    Consulted and Agree with Plan of Care Patient           Patient will benefit from skilled therapeutic intervention in order to improve the following deficits and impairments:  Decreased activity tolerance,Decreased range of motion,Decreased strength,Pain  Visit Diagnosis: Muscle weakness (generalized)  Acute pain of right shoulder  Localized edema  Right shoulder pain, unspecified chronicity     Problem List Patient Active Problem List   Diagnosis Date Noted  . Angioedema 01/21/2019  . Vertigo 09/28/2016  . Hyperlipidemia 09/25/2011  . Hypertension     Joanna Ray 04/11/2020, 3:01 PM  [facility] Physical Therapy [address] [place], [place], [address] Phone: [phone]   Fax:  [phone]  Name: Joanna Ray MRN: [id number] Date of Birth: 12-17-1950

## 2020-04-16 ENCOUNTER — Ambulatory Visit (INDEPENDENT_AMBULATORY_CARE_PROVIDER_SITE_OTHER): Payer: Medicare Other | Admitting: Physical Therapy

## 2020-04-16 ENCOUNTER — Encounter: Payer: Self-pay | Admitting: Physical Therapy

## 2020-04-16 ENCOUNTER — Other Ambulatory Visit: Payer: Self-pay

## 2020-04-16 DIAGNOSIS — M6281 Muscle weakness (generalized): Secondary | ICD-10-CM | POA: Diagnosis not present

## 2020-04-16 DIAGNOSIS — R600 Localized edema: Secondary | ICD-10-CM | POA: Diagnosis not present

## 2020-04-16 DIAGNOSIS — M25511 Pain in right shoulder: Secondary | ICD-10-CM

## 2020-04-16 NOTE — Therapy (Signed)
[facility] [address] [place], [place], [address] Phone: [phone]   Fax:  [phone]  Physical Therapy Treatment  Patient Details  Name: Joanna Ray MRN: [id number] Date of Birth: [birth year] Referring Provider (PT): Eunice Blase, MD   Encounter Date: 04/16/2020   PT End of Session - 04/16/20 1309    Visit Number 7    Number of Visits 12    Date for PT Re-Evaluation 05/09/20    Authorization Type MCR    PT Start Time 1302    PT Stop Time 1340    PT Time Calculation (min) 38 min    Activity Tolerance No increased pain;Patient tolerated treatment well    Behavior During Therapy [facility] for tasks assessed/performed           Past Medical History:  Diagnosis Date  . Anxiety   . GERD (gastroesophageal reflux disease)   . Hyperlipidemia   . Hypertension   . Urticaria     Past Surgical History:  Procedure Laterality Date  . CATARACT EXTRACTION, BILATERAL Bilateral   . DILATION AND CURETTAGE OF UTERUS      There were no vitals filed for this visit.   Subjective Assessment - 04/16/20 1307    Subjective Pt arriving today reporting 9/10 pain in right shoulder. Pt reporting she dropped some fish she was cooking and as a reflex she tried to catch it before it hit the floor with her R arm and her pain was 10/10 and sore the next couple of days. Pt still reporting increased pain.    Diagnostic tests Diagnostic ultrasound right shoulder: Long head biceps tendon is located in its groove.  Subscapularis tendon looks normal.  Supraspinatus has tendinopathy changes but no high-grade tear.  Infraspinatus tendon looks intact.  There is thickening of the subacromial/subdeltoid bursa, no hyperemia on power Doppler imaging.    Patient Stated Goals reduce pain and improve funciton of her Rt arm    Currently in Pain? Yes    Pain Score 9     Pain Location Shoulder    Pain Orientation Right    Pain Descriptors / Indicators Sore;Aching    Pain  Type Chronic pain    Pain Onset More than a month ago    Pain Frequency Constant                             [facility] Adult PT Treatment/Exercise - 04/16/20 0001      Shoulder Exercises: Supine   Flexion AROM;Right;10 reps    Flexion Limitations has to bend elbow, cant yet perform with straight elbow due to weakness      Shoulder Exercises: Sidelying   External Rotation Right;15 reps   2     Shoulder Exercises: Standing   Shoulder Elevation Limitations attempted ball rolls up the wall using bilateral UE's pt unable to toerate    Other Standing Exercises wall ladder flexion and scaption X 10, table slides  x 20 bilateral UE's    Other Standing Exercises pendulums circles, A-P, lateral X 15 ea      Shoulder Exercises: Pulleys   Flexion 2 minutes    ABduction 2 minutes      Shoulder Exercises: ROM/Strengthening   UBE (Upper Arm Bike) no resistance 2 min fwd, 2 min retro    Ranger 10 reps flexion, clockwise and counter clockwise circles x 20 each direction      Vasopneumatic   Number Minutes Vasopneumatic  10 minutes    Vasopnuematic Location  Shoulder    Vasopneumatic Pressure High    Vasopneumatic Temperature  34                  PT Education - 04/16/20 1338    Education Details edu in tables slides flexion, scaption, and ER to add to pt's HEP.    Person(s) Educated Patient    Methods Explanation;Demonstration    Comprehension Verbalized understanding;Returned demonstration            PT Short Term Goals - 04/16/20 1317      PT SHORT TERM GOAL #1   Title Pt will be I and compliant with HEP    Status On-going      PT SHORT TERM GOAL #2   Title Pt will improve FOTO 5%    Status On-going             PT Long Term Goals - 04/16/20 1317      PT LONG TERM GOAL #1   Title Pt will improve FOTO to 62% functional    Status On-going      PT LONG TERM GOAL #2   Title Pt will improve Rt shoulder strength to 4+ all planes    Status On-going       PT LONG TERM GOAL #3   Title Pt will improve Rt shoulder AROM to [facility]    Status On-going      PT LONG TERM GOAL #4   Title Pt will reduce overall pain to 4 or less    Status On-going                 Plan - 04/16/20 1310    Clinical Impression Statement Pt arriving to therapy reporting increased pain since attempting to catch her food with her R UE over the weekend. Pt able to tolerate exercises with instructions for technique. Pt allowing increased time for end range exercises. Pt reporting less pain at end of session.  Conitnue skilled PT.    Examination-Activity Limitations Carry;Dressing;Hygiene/Grooming;Sleep;Reach Overhead    Examination-Participation Restrictions Cleaning;Driving;Occupation    Stability/Clinical Decision Making Evolving/Moderate complexity    Rehab Potential Good    PT Frequency 2x / week    PT Duration 8 weeks    PT Treatment/Interventions ADLs/Self Care Home Management;Cryotherapy;Electrical Stimulation;Iontophoresis 4mg /ml Dexamethasone;Moist Heat;Traction;Ultrasound;Therapeutic activities;Therapeutic exercise;Neuromuscular re-education;Patient/family education;Manual techniques;Dry needling;Passive range of motion;Joint Manipulations;Spinal Manipulations;Vasopneumatic Device;Taping    PT Next Visit Plan flexion, abd, Scapular and RTC strength progressions as able    PT Home Exercise Plan Access Code: [email or web address]    Consulted and Agree with Plan of Care Patient           Patient will benefit from skilled therapeutic intervention in order to improve the following deficits and impairments:  Decreased activity tolerance,Decreased range of motion,Decreased strength,Pain  Visit Diagnosis: Muscle weakness (generalized)  Acute pain of right shoulder  Localized edema  Right shoulder pain, unspecified chronicity     Problem List Patient Active Problem List   Diagnosis Date Noted  . Angioedema 01/21/2019  . Vertigo 09/28/2016  . Hyperlipidemia  09/25/2011  . Hypertension     Oretha Caprice, PT, MPT 04/16/2020, 1:45 PM  [facility] [address] [place], [place], [address] Phone: [phone]   Fax:  [phone]  Name: Joanna Ray MRN: [id number] Date of Birth: 05/16/1950

## 2020-04-18 ENCOUNTER — Ambulatory Visit
Admission: RE | Admit: 2020-04-18 | Discharge: 2020-04-18 | Disposition: A | Discharge status: Home / Self Care | Payer: Medicare Other | Source: Ambulatory Visit | Attending: Internal Medicine | Admitting: Internal Medicine

## 2020-04-18 ENCOUNTER — Other Ambulatory Visit: Payer: Self-pay

## 2020-04-18 ENCOUNTER — Encounter: Payer: Self-pay | Admitting: Internal Medicine

## 2020-04-18 ENCOUNTER — Telehealth: Payer: Self-pay | Admitting: Internal Medicine

## 2020-04-18 ENCOUNTER — Ambulatory Visit (INDEPENDENT_AMBULATORY_CARE_PROVIDER_SITE_OTHER): Payer: Medicare Other | Admitting: Internal Medicine

## 2020-04-18 VITALS — Temp 98.8°F

## 2020-04-18 DIAGNOSIS — J22 Unspecified acute lower respiratory infection: Secondary | ICD-10-CM

## 2020-04-18 DIAGNOSIS — I10 Essential (primary) hypertension: Secondary | ICD-10-CM | POA: Diagnosis not present

## 2020-04-18 DIAGNOSIS — R059 Cough, unspecified: Secondary | ICD-10-CM

## 2020-04-18 MED ORDER — HYDROCODONE-HOMATROPINE 5-1.5 MG/5ML PO SYRP: 5.0000 mL | ORAL_SOLUTION | Freq: Three times a day (TID) | ORAL | 0 refills | Status: DC | PRN

## 2020-04-18 MED ORDER — CLARITHROMYCIN 500 MG PO TABS: 500.0000 mg | ORAL_TABLET | Freq: Two times a day (BID) | ORAL | 0 refills | Status: DC

## 2020-04-18 NOTE — Telephone Encounter (Signed)
Joanna Ray [phone]  [name] called to say she has a cough, chest and nasal congestion, she has some nasal drainage that is sometimes clear and yellow, she sounds hoarse to me, she also has scratchy throat. She was tested for COVID on Tuesday and it resulted today as negative. She has been taking Musinex, would like something for cough

## 2020-04-18 NOTE — Telephone Encounter (Signed)
Scheduled

## 2020-04-18 NOTE — Progress Notes (Signed)
   Subjective:    Patient ID: Joanna Ray, female    DOB: 02-08-1951, 69 y.o.   MRN: [id number]  HPI 69 year old Female with history of hypertension called complaining of cough, chest and nasal congestion.  Sometimes has discolored nasal drainage that is yellow.  Noted to be hoarse over the telephone.  She was tested for Covid on December 21 and result was negative.  She has been taking Mucinex.  Office visit was advised.  She has had flu vaccine November 9 and 2 Moderna vaccines in February and March of this year.  Review of Systems see above     Objective:   Physical Exam She is afebrile.  Pulse oximetry is 98% blood pressure 140/70 pulse 76  Pharynx is clear.  TMs are clear.  Neck is supple.  Chest clear.  She sounds nasally congested.       Assessment & Plan:  Acute lower respiratory infection  Essential hypertension  History of elevated creatinine on olmesartan  Plan: Biaxin 500 mg twice daily for 10 days.  Rest and drink fluids.

## 2020-04-18 NOTE — Telephone Encounter (Signed)
Car visit

## 2020-04-23 ENCOUNTER — Ambulatory Visit (INDEPENDENT_AMBULATORY_CARE_PROVIDER_SITE_OTHER): Payer: Medicare Other | Admitting: Internal Medicine

## 2020-04-23 ENCOUNTER — Other Ambulatory Visit: Payer: Self-pay

## 2020-04-23 ENCOUNTER — Encounter: Payer: Self-pay | Admitting: Internal Medicine

## 2020-04-23 VITALS — BP 140/60 | HR 68 | Ht 65.0 in | Wt 203.0 lb

## 2020-04-23 DIAGNOSIS — Z5181 Encounter for therapeutic drug level monitoring: Secondary | ICD-10-CM | POA: Diagnosis not present

## 2020-04-23 DIAGNOSIS — R7989 Other specified abnormal findings of blood chemistry: Secondary | ICD-10-CM

## 2020-04-23 DIAGNOSIS — Z79899 Other long term (current) drug therapy: Secondary | ICD-10-CM

## 2020-04-23 DIAGNOSIS — I10 Essential (primary) hypertension: Secondary | ICD-10-CM

## 2020-04-23 DIAGNOSIS — R609 Edema, unspecified: Secondary | ICD-10-CM | POA: Diagnosis not present

## 2020-04-23 LAB — BASIC METABOLIC PANEL
BUN/Creatinine Ratio: 18 (calc) (ref 6–22)
BUN: 22 mg/dL (ref 7–25)
CO2: 28 mmol/L (ref 20–32)
Calcium: 10.1 mg/dL (ref 8.6–10.4)
Chloride: 106 mmol/L (ref 98–110)
Creat: 1.23 mg/dL — ABNORMAL HIGH (ref 0.50–0.99)
Glucose, Bld: 76 mg/dL (ref 65–99)
Potassium: 5.4 mmol/L — ABNORMAL HIGH (ref 3.5–5.3)
Sodium: 141 mmol/L (ref 135–146)

## 2020-04-23 NOTE — Progress Notes (Signed)
   Subjective:    Patient ID: Joanna Ray, female    DOB: [birth year], 69 y.o.   MRN: [id number]  HPI 69 year old Female seen for follow up on dependednt edema, recently had elevated serum creatinine which occurred on olmesartan which was discontinued, and hypertension.  On December 14 she called saying that her legs were swelling.  She had been placed on amlodipine in place of olmesartan.  Advised to try Maxide 25 and follow-up in 2 weeks.  Continued on amlodipine until today.   Review of Systems patient says BP is  excellent at home     Objective:   Physical Exam  Trace lower extremity edema.  Chest clear.  Cardiac exam regular rate and rhythm. Blood pressure 140/60 pulse 68 pulse oximetry 98%     Assessment & Plan:  Essential hypertension-currently just on Maxide 25.  Elevated serum creatinine  Dependent edema on Norvasc  Elevated serum potassium-?  Hemolyzed specimen  Basic metabolic panel drawn and creatinine is once again elevated at 1.23 on Maxide 25 which is HCTZ and triamterene.  Her potassium is elevated at 5.4 it is well up from 3.9.  It could be a hemolyzed specimen.  We are going to recommend that she follow-up in early January.  She will have repeat lab work when well-hydrated and we will look at the situation again.  She may not tolerate Maxide 25.

## 2020-04-26 ENCOUNTER — Encounter: Payer: Self-pay | Admitting: Internal Medicine

## 2020-04-26 NOTE — Patient Instructions (Signed)
Serum creatinine improved after stopping olmesartan.  Creatinine now stable.  Follow-up December 28.

## 2020-04-26 NOTE — Patient Instructions (Signed)
Not seen and appointment cancelled.  Referring to Ortho.

## 2020-04-26 NOTE — Patient Instructions (Signed)
Repeat basic metabolic panel in early January when well-hydrated.  May need office visit after lab draw.

## 2020-04-26 NOTE — Progress Notes (Signed)
Appointment cancelled for today. She has right arm and shoulder pain that has not improved from visit here 2 weeks ago. Likely has Adhesive capsulitis and should see orthopedist.Appt arranged at [facility].

## 2020-04-26 NOTE — Patient Instructions (Signed)
Your test December 21 reported is negative for COVID-19.  Take Biaxin 500 mg twice daily for 10 days.  Rest and drink fluids.  He has follow-up appointment here on December 28.  This was an acute visit today.

## 2020-05-02 ENCOUNTER — Ambulatory Visit (INDEPENDENT_AMBULATORY_CARE_PROVIDER_SITE_OTHER): Payer: Medicare HMO | Admitting: Rehabilitative and Restorative Service Providers"

## 2020-05-02 ENCOUNTER — Other Ambulatory Visit: Payer: Self-pay

## 2020-05-02 ENCOUNTER — Encounter: Payer: Self-pay | Admitting: Rehabilitative and Restorative Service Providers"

## 2020-05-02 DIAGNOSIS — R600 Localized edema: Secondary | ICD-10-CM

## 2020-05-02 DIAGNOSIS — M25511 Pain in right shoulder: Secondary | ICD-10-CM | POA: Diagnosis not present

## 2020-05-02 DIAGNOSIS — M6281 Muscle weakness (generalized): Secondary | ICD-10-CM | POA: Diagnosis not present

## 2020-05-02 NOTE — Patient Instructions (Signed)
Access Code: [email or web address] URL: [email or web address] Date: 05/02/2020 Prepared by: Pauletta Browns  Exercises Standing Scapular Retraction - 5 x daily - 7 x weekly - 1 sets - 5 reps - 5 second hold Supine Scapular Protraction in Flexion with Dumbbells - 2-3 x daily - 7 x weekly - 2 sets - 20 reps - 3 seconds hold Sidelying Shoulder External Rotation Dumbbell - 2-3 x daily - 7 x weekly - 2 sets - 10 reps - 3 second hold Standing Shoulder Internal Rotation with Anchored Resistance - 2-3 x daily - 7 x weekly - 1 sets - 10 reps - 3 seconds hold  Patient Education TENS Unit

## 2020-05-02 NOTE — Therapy (Signed)
[facility] [address] [place], [place], [address] Phone: [phone]   Fax:  [phone]  Physical Therapy Treatment  Patient Details  Name: Joanna Ray MRN: [id number] Date of Birth: 02/15/1951 Referring Provider (PT): Lavada Mesi, MD   Encounter Date: 05/02/2020   PT End of Session - 05/02/20 1623    Visit Number 8    Number of Visits 12    Date for PT Re-Evaluation 05/09/20    Authorization Type MCR    PT Start Time 1430    PT Stop Time 1515    PT Time Calculation (min) 45 min    Activity Tolerance No increased pain;Patient tolerated treatment well    Behavior During Therapy [facility] for tasks assessed/performed           Past Medical History:  Diagnosis Date  . Anxiety   . GERD (gastroesophageal reflux disease)   . Hyperlipidemia   . Hypertension   . Urticaria     Past Surgical History:  Procedure Laterality Date  . CATARACT EXTRACTION, BILATERAL Bilateral   . DILATION AND CURETTAGE OF UTERUS      There were no vitals filed for this visit.   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    Limitations Lifting    Diagnostic tests Diagnostic ultrasound right shoulder: Long head biceps tendon is located in its groove.  Subscapularis tendon looks normal.  Supraspinatus has tendinopathy changes but no high-grade tear.  Infraspinatus tendon looks intact.  There is thickening of the subacromial/subdeltoid bursa, no hyperemia on power Doppler imaging.    Patient Stated Goals reduce pain and improve funciton of her Rt arm    Currently in Pain? Yes    Pain Score 5     Pain Location Shoulder    Pain Orientation Right    Pain Descriptors / Indicators Aching;Sore    Pain Type Chronic pain    Pain Onset More than a month ago    Pain Frequency Constant    Aggravating Factors  Fatigue and R UE use    Pain Relieving  Factors Rest    Effect of Pain on Daily Activities Unable to use R arm with most ADLs    Multiple Pain Sites No              [facility] PT Assessment - 05/02/20 0001      AROM   Right Shoulder Internal Rotation 70 Degrees    Right Shoulder External Rotation 80 Degrees                         [facility] Adult PT Treatment/Exercise - 05/02/20 0001      Neuro Re-ed    Neuro Re-ed Details  Rhythmic stabilizations (IR/ER and straight up reach) 8 minutes      Exercises   Exercises Shoulder      Shoulder Exercises: Supine   Protraction Strengthening;Right;20 reps    Protraction Limitations AAROM L helps    External Rotation AAROM;Right;10 reps;Other (comment)    External Rotation Limitations 10 seconds stretch    Internal Rotation AAROM;Right;10 reps    Internal Rotation Limitations 10 seconds stretch      Shoulder Exercises: Sidelying   External Rotation Strengthening;Right;10 reps;Other (comment)    External Rotation Limitations Pillow under elbow      Shoulder Exercises: Standing   External Rotation Strengthening;Right  Theraband Level (Shoulder External Rotation) Level 2 (Red)    External Rotation Limitations Attempted-too early    Internal Rotation Strengthening;Right;10 reps;Theraband    Theraband Level (Shoulder Internal Rotation) Level 2 (Red)    Internal Rotation Limitations slow eccentrics    Flexion Strengthening;Right;Theraband    Theraband Level (Shoulder Flexion) Level 2 (Red)    Flexion Limitations Attempted-too weak/early    Extension Strengthening;Right;10 reps;Theraband;Limitations    Theraband Level (Shoulder Extension) Level 2 (Red)    Extension Limitations Elbow straight palm up limited range    Other Standing Exercises Biceps curls 4# with supination and slow eccentrics                  PT Education - 05/02/20 1622    Education Details Reviewed and updated her clinic and home program.    Person(s) Educated Patient    Methods  Explanation;Demonstration;Tactile cues;Verbal cues;Handout    Comprehension Verbal cues required;Need further instruction;Returned demonstration;Verbalized understanding;Tactile cues required            PT Short Term Goals - 05/02/20 1623      PT SHORT TERM GOAL #1   Title Pt will be I and compliant with HEP    Status On-going      PT SHORT TERM GOAL #2   Title Pt will improve FOTO 5%    Status On-going             PT Long Term Goals - 05/02/20 1623      PT LONG TERM GOAL #1   Title Pt will improve FOTO to 62% functional    Status On-going      PT LONG TERM GOAL #2   Title Pt will improve Rt shoulder strength to 4+ all planes    Status On-going      PT LONG TERM GOAL #3   Title Pt will improve Rt shoulder AROM to [facility]    Status On-going      PT LONG TERM GOAL #4   Title Pt will reduce overall pain to 4 or less    Status On-going                 Plan - 05/02/20 1620    Clinical Impression Statement 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  Her home program was reviewed and updated to reflect this.  Although stronger than at evaluation, her strength remains poor and she is unable to reach overhead for anything functional.  She did note she can now do her hair but substituting and flexing her neck.  Scapular, rotator cuff and general UE strength and function will benefit from continued work.    Examination-Activity Limitations Carry;Dressing;Hygiene/Grooming;Sleep;Reach Overhead    Examination-Participation Restrictions Cleaning;Driving;Occupation    Stability/Clinical Decision Making Evolving/Moderate complexity    Rehab Potential Good    PT Frequency 2x / week    PT Duration 8 weeks    PT Treatment/Interventions ADLs/Self Care Home Management;Cryotherapy;Electrical Stimulation;Iontophoresis 4mg /ml Dexamethasone;Moist Heat;Traction;Ultrasound;Therapeutic activities;Therapeutic exercise;Neuromuscular  re-education;Patient/family education;Manual techniques;Dry needling;Passive range of motion;Joint Manipulations;Spinal Manipulations;Vasopneumatic Device;Taping    PT Next Visit Plan flexion, abd, Scapular and RTC strength progressions as able    PT Home Exercise Plan Access Code:    Consulted and Agree with Plan of Care Patient           Patient will benefit from skilled therapeutic intervention in order to improve the following deficits and impairments:  Decreased activity tolerance,Decreased range of  motion,Decreased strength,Pain  Visit Diagnosis: Muscle weakness (generalized)  Right shoulder pain, unspecified chronicity  Localized edema     Problem List Patient Active Problem List   Diagnosis Date Noted  . Angioedema 01/21/2019  . Vertigo 09/28/2016  . Hyperlipidemia 09/25/2011  . Hypertension     Farley Ly PT, MPT 05/02/2020, 4:25 PM  [facility] Therapy [address] [place], [place], [address] Phone: [phone]   Fax:  [phone]  Name: LESLEI SUMBRY MRN: [id number] Date of Birth: [birth year]

## 2020-05-03 ENCOUNTER — Other Ambulatory Visit: Payer: Medicare HMO | Admitting: Internal Medicine

## 2020-05-03 DIAGNOSIS — R7989 Other specified abnormal findings of blood chemistry: Secondary | ICD-10-CM | POA: Diagnosis not present

## 2020-05-04 LAB — BASIC METABOLIC PANEL
BUN/Creatinine Ratio: 15 (calc) (ref 6–22)
BUN: 18 mg/dL (ref 7–25)
CO2: 32 mmol/L (ref 20–32)
Calcium: 9.9 mg/dL (ref 8.6–10.4)
Chloride: 101 mmol/L (ref 98–110)
Creat: 1.2 mg/dL — ABNORMAL HIGH (ref 0.50–0.99)
Glucose, Bld: 90 mg/dL (ref 65–99)
Potassium: 4.6 mmol/L (ref 3.5–5.3)
Sodium: 140 mmol/L (ref 135–146)

## 2020-05-06 ENCOUNTER — Encounter: Payer: Self-pay | Admitting: Physical Therapy

## 2020-05-06 ENCOUNTER — Ambulatory Visit (INDEPENDENT_AMBULATORY_CARE_PROVIDER_SITE_OTHER): Payer: Medicare HMO | Admitting: Physical Therapy

## 2020-05-06 ENCOUNTER — Other Ambulatory Visit: Payer: Self-pay

## 2020-05-06 DIAGNOSIS — M6281 Muscle weakness (generalized): Secondary | ICD-10-CM | POA: Diagnosis not present

## 2020-05-06 DIAGNOSIS — M25511 Pain in right shoulder: Secondary | ICD-10-CM | POA: Diagnosis not present

## 2020-05-06 DIAGNOSIS — R600 Localized edema: Secondary | ICD-10-CM

## 2020-05-06 NOTE — Therapy (Addendum)
[facility] Therapy [address] [place], [place], [address] Phone: [phone]   Fax:  [phone]  Physical Therapy Treatment  Patient Details  Name: Joanna Ray MRN: [id number] Date of Birth: 10/18/1950 Referring Provider (PT): Hilts, Legrand Como, MD  PHYSICAL THERAPY DISCHARGE SUMMARY  Visits from Start of Care: 9  Current functional level related to goals / functional outcomes: See note   Remaining deficits: See note   Education / Equipment: HEP  Plan:                                                    Patient goals were not met. Patient is being discharged due to the physician's request.  ?????     Encounter Date: 05/06/2020   PT End of Session - 05/06/20 1611    Visit Number 9    Number of Visits 12    Date for PT Re-Evaluation 05/09/20    Authorization Type MCR    PT Start Time 1515    PT Stop Time 1555    PT Time Calculation (min) 40 min    Activity Tolerance Patient tolerated treatment well    Behavior During Therapy [facility] for tasks assessed/performed           Past Medical History:  Diagnosis Date  . Anxiety   . GERD (gastroesophageal reflux disease)   . Hyperlipidemia   . Hypertension   . Urticaria     Past Surgical History:  Procedure Laterality Date  . CATARACT EXTRACTION, BILATERAL Bilateral   . DILATION AND CURETTAGE OF UTERUS      There were no vitals filed for this visit.   Subjective Assessment - 05/06/20 1528    Subjective relays she still cant lift her Rt arm up very well and has pain when she tries to, she does not have much pain at rest    Limitations Lifting    Diagnostic tests Diagnostic ultrasound right shoulder: Long head biceps tendon is located in its groove.  Subscapularis tendon looks normal.  Supraspinatus has tendinopathy changes but no high-grade tear.  Infraspinatus tendon looks intact.  There is thickening of the subacromial/subdeltoid bursa, no hyperemia on power Doppler imaging.     Patient Stated Goals reduce pain and improve funciton of her Rt arm    Pain Onset More than a month ago              [facility] PT Assessment - 05/06/20 0001      Assessment   Medical Diagnosis M25.511 (ICD-10-CM) - Acute pain of right shoulder    Referring Provider (PT) Hilts, Michael, MD      AROM   Right Shoulder Flexion 50 Degrees    Right Shoulder ABduction 50 Degrees      Strength   Right Shoulder Flexion 2/5    Right Shoulder ABduction 2/5    Right Shoulder External Rotation 3+/5                         [facility] Adult PT Treatment/Exercise - 05/06/20 0001      Shoulder Exercises: Pulleys   Flexion 3 minutes    ABduction 2 minutes      Shoulder Exercises: ROM/Strengthening   UBE (Upper Arm Bike) no resistance 3 min fwd, 3 min retro      Electrical Stimulation  Electrical Stimulation Location Rt shoulder    Electrical Stimulation Action NMES/Russian 5 sec on/off 12 min total with active flexion, abd, ER (manual resistance)                    PT Short Term Goals - 05/02/20 1623      PT SHORT TERM GOAL #1   Title Pt will be I and compliant with HEP    Status On-going      PT SHORT TERM GOAL #2   Title Pt will improve FOTO 5%    Status On-going             PT Long Term Goals - 05/02/20 1623      PT LONG TERM GOAL #1   Title Pt will improve FOTO to 62% functional    Status On-going      PT LONG TERM GOAL #2   Title Pt will improve Rt shoulder strength to 4+ all planes    Status On-going      PT LONG TERM GOAL #3   Title Pt will improve Rt shoulder AROM to [facility]    Status On-going      PT LONG TERM GOAL #4   Title Pt will reduce overall pain to 4 or less    Status On-going                 Plan - 05/06/20 1612    Clinical Impression Statement She continues to have severe weakness and limited funcitonal use of her Rt shoulder. Tried NMES/Russian stimulation today in efforts to improve motor recruitment and strength in her  delts and RTC. She was encouraged to follow back up with MD due to her weakness and very slow progress. PT will continue to work to improve strength in the meantime.    Examination-Activity Limitations Carry;Dressing;Hygiene/Grooming;Sleep;Reach Overhead    Examination-Participation Restrictions Cleaning;Driving;Occupation    Stability/Clinical Decision Making Evolving/Moderate complexity    Rehab Potential Good    PT Frequency 2x / week    PT Duration 8 weeks    PT Treatment/Interventions ADLs/Self Care Home Management;Cryotherapy;Electrical Stimulation;Iontophoresis 66m/ml Dexamethasone;Moist Heat;Traction;Ultrasound;Therapeutic activities;Therapeutic exercise;Neuromuscular re-education;Patient/family education;Manual techniques;Dry needling;Passive range of motion;Joint Manipulations;Spinal Manipulations;Vasopneumatic Device;Taping    PT Next Visit Plan how was response to [place] needs 10th visit progress note    PT Home Exercise Plan Access Code: [email or web address]   Consulted and Agree with Plan of Care Patient           Patient will benefit from skilled therapeutic intervention in order to improve the following deficits and impairments:  Decreased activity tolerance,Decreased range of motion,Decreased strength,Pain  Visit Diagnosis: Muscle weakness (generalized)  Right shoulder pain, unspecified chronicity  Localized edema  Acute pain of right shoulder     Problem List Patient Active Problem List   Diagnosis Date Noted  . Angioedema 01/21/2019  . Vertigo 09/28/2016  . Hyperlipidemia 09/25/2011  . Hypertension     BSilvestre Mesi1/01/2021, 4:16 PM  RFarley LyPT, MPT  [facility] [address] [address]Phone: [phone]  Fax:  [phone] Name: BTALECIA SHERLINMRN: [id number] of Birth: 1May 02, 1952

## 2020-05-07 ENCOUNTER — Other Ambulatory Visit: Payer: Self-pay

## 2020-05-07 ENCOUNTER — Ambulatory Visit (INDEPENDENT_AMBULATORY_CARE_PROVIDER_SITE_OTHER): Payer: Medicare HMO | Admitting: Internal Medicine

## 2020-05-07 ENCOUNTER — Encounter: Payer: Self-pay | Admitting: Internal Medicine

## 2020-05-07 VITALS — BP 140/70 | HR 64 | Ht 65.0 in | Wt 199.0 lb

## 2020-05-07 DIAGNOSIS — R7989 Other specified abnormal findings of blood chemistry: Secondary | ICD-10-CM | POA: Diagnosis not present

## 2020-05-07 DIAGNOSIS — I10 Essential (primary) hypertension: Secondary | ICD-10-CM

## 2020-05-07 MED ORDER — CARVEDILOL 6.25 MG PO TABS: 6.2500 mg | ORAL_TABLET | Freq: Two times a day (BID) | ORAL | 0 refills | Status: DC

## 2020-05-07 NOTE — Progress Notes (Signed)
   Subjective:    Patient ID: Joanna Ray, female    DOB: 12-06-1950, 70 y.o.   MRN: [id number]  HPI 70 year old Female seen today to discuss antihypertensive medication management and elevated serum creatinine.    Patient has longstanding history of essential hypertension and hyperlipidemia.  She is on statin therapy.   She  was noted to have creatinine elevation of 1.29 on November 8.  She was on olmesartan HCTZ 40/25 at the time.  She was changed from olmesartan HCTZ 40/25 to amlodipine 5 mg daily on November 9 along with HCTZ 25 mg daily.    On November 22, her creatinine was 1.04 on amlodipine and HCTZ.    However, she called on December 14 complaining that her legs were swelling on amlodipine and HCTZ.  Was advised to discontinue HCTZ and try Maxide 25 daily which contained to diuretics (HCTZ and triamterene).   On April 23, 2020, her creatinine was 1.23 and on January 7, .2022 her creatinine was 1.20. I asked her to come in and discuss this increase in creatinine and BP management.    For a long time, she was maintained on lisinopril HCTZ with good BP control.  However, in the in the Fall of 2020, she was having issues with angioedema.  She thought it might be her antihypertensive medication.  She had extensive testing by allergist and had continued intermittent issues with lip swelling.  Admitted to be anxious and depressed.  Component with the pandemic.  It was my feeling that anxiety was triggering episodes of angioedema and I prescribed Xanax and started her on Lexapro.  Allergist had treated her with prednisone September 2020 for 5 days.    I changed her antihypertensive medication in September 2020 from lisinopril HCTZ to Benicar 20/12.5 mg. However, I was not completely convinced that lisinopril had caused her angioedema but she thought perhaps it did.       Her creatinine was 1.09 on January 09, 2019 but had been 1.23 on September 2,2020 when seen by Dr. Nelva Bush,  allergist.  It was felt that she had possible shrimp/shellfish allergy.  She was on lisinopril HCTZ then.  In the March 2021 she presented to the Emergency department with chest pain.  MI was ruled out and she was referred to [facility] where she saw Dr. Terri Skains.  She had echocardiogram and a myocardial perfusion scan. Benicar HCTZ 20/12.5  was continued.  In May 2021, Benicar was increased to 40/25 as BP was elevated at 146/80 then. A  B-met was rechecked May 25 and was 1.13 but had been 1.03 before the increase in dosage of Benicar HCTZ.    Review of Systems see above She has no pitting edema of legs     Objective:   Physical Exam  BP 140/70 pulse 64 Weight 199 pounds BMI 33.12 She weighed 203 pounds December 2021 Chest is clear. Cor RRR. No LE pitting edema     Assessment & Plan:  Elevated serum creatinine- change BP med to Coreg 6.25 mg twice daily and Follow up in 2 weeks with OV BP check and B-met.  Has follow up with Dr. Nelva Bush soon.    Her med list indicates she is taking Lexapro only on a as needed basis as reported on December 28.  This is not a medication being taken on a as needed basis.  She will need to take it daily or discontinue it.

## 2020-05-07 NOTE — Patient Instructions (Addendum)
New blood pressure regimen will be Coreg 6.25 mg twice daily.  Follow-up in 2 weeks with office visit, blood pressure check and basic metabolic panel.  Lexapro cannot be taken on a as needed basis.  Needs to be taken daily or not at all.

## 2020-05-08 ENCOUNTER — Encounter: Payer: Medicare HMO | Admitting: Physical Therapy

## 2020-05-09 ENCOUNTER — Telehealth: Payer: Self-pay | Admitting: Internal Medicine

## 2020-05-09 ENCOUNTER — Ambulatory Visit: Payer: Medicare HMO | Admitting: Allergy

## 2020-05-09 NOTE — Telephone Encounter (Signed)
Please fix the record to reflect this

## 2020-05-09 NOTE — Telephone Encounter (Signed)
Joanna Ray [phone]  [name] said she was returning your call.  escitalopram (LEXAPRO) 10 MG tablet -- this medication she has not taking in 6 months or more.  ALPRAZolam (XANAX) 0.5 MG tablet -- this medication she takes daily.

## 2020-05-13 ENCOUNTER — Encounter: Payer: Medicare Other | Admitting: Physical Therapy

## 2020-05-13 ENCOUNTER — Ambulatory Visit: Payer: Medicare HMO | Admitting: Family Medicine

## 2020-05-15 ENCOUNTER — Encounter: Payer: Medicare Other | Admitting: Physical Therapy

## 2020-05-17 ENCOUNTER — Encounter: Payer: Self-pay | Admitting: Internal Medicine

## 2020-05-17 ENCOUNTER — Ambulatory Visit (INDEPENDENT_AMBULATORY_CARE_PROVIDER_SITE_OTHER): Payer: Medicare HMO | Admitting: Family Medicine

## 2020-05-17 ENCOUNTER — Encounter: Payer: Self-pay | Admitting: Family Medicine

## 2020-05-17 ENCOUNTER — Telehealth: Payer: Self-pay | Admitting: Internal Medicine

## 2020-05-17 ENCOUNTER — Other Ambulatory Visit: Payer: Self-pay

## 2020-05-17 ENCOUNTER — Ambulatory Visit (INDEPENDENT_AMBULATORY_CARE_PROVIDER_SITE_OTHER): Payer: Medicare HMO | Admitting: Internal Medicine

## 2020-05-17 DIAGNOSIS — I10 Essential (primary) hypertension: Secondary | ICD-10-CM | POA: Diagnosis not present

## 2020-05-17 DIAGNOSIS — M25511 Pain in right shoulder: Secondary | ICD-10-CM

## 2020-05-17 DIAGNOSIS — R001 Bradycardia, unspecified: Secondary | ICD-10-CM | POA: Diagnosis not present

## 2020-05-17 DIAGNOSIS — R609 Edema, unspecified: Secondary | ICD-10-CM

## 2020-05-17 MED ORDER — LISINOPRIL-HYDROCHLOROTHIAZIDE 20-12.5 MG PO TABS: 1.0000 | ORAL_TABLET | Freq: Every day | ORAL | 0 refills | Status: DC

## 2020-05-17 MED ORDER — TRAMADOL HCL 50 MG PO TABS: 50.0000 mg | ORAL_TABLET | Freq: Four times a day (QID) | ORAL | 0 refills | Status: DC | PRN

## 2020-05-17 NOTE — Patient Instructions (Signed)
Discontinue carvedilol.  Take lisinopril HCTZ 20/12.5 mg daily.  Watch salt intake.  Call for appointment to be seen early next week.

## 2020-05-17 NOTE — Telephone Encounter (Signed)
Joanna Ray [phone]  [name] called to say that her heart rate was 46 this morning about and 1 hour after she had taking her medication and her legs started swelling last night and are still swollen today. She went ahead and went to work this morning and did not call us until she got off work this afternoon.

## 2020-05-17 NOTE — Progress Notes (Signed)
° °  Subjective:    Patient ID: Joanna Ray, female    DOB: 17-Jul-1950, 70 y.o.   MRN: [id number]  HPI 70 year old Female called today with complaint of lower extremity edema and bradycardia with heart rate of 46.  We have previously tried olmesartan HCTZ but creatinine elevated at 1.29 on November 8.  She was then changed to amlodipine and HCTZ.  Her creatinine normalized.  However she complained that her legs were swelling on amlodipine.  Was changed to Maxide 25 and HCTZ was discontinued.  Her creatinine was elevated at 1.23 on January 7.  Has been placed on Coreg 6.25 mg twice daily as January 11.  Apparently legs began to swell fairly quickly.  This is the first time we had heard that she was having an issue since office visit on January 11.  She had 2-week follow-up scheduled for February 1.  Patient says best blood pressure medicine she ever took in terms of control and few side effects was lisinopril.  However allergist thought it perhaps caused angioedema.  She was on lisinopril HCTZ at that time.  She had extensive allergy testing at the time.  It is late and  the office is closing due to inclement weather..  She is identified using 2 identifiers as Joanna Ray, a patient in this practice and is agreeable to visit in this format today.  She is in her car and I am in my office.    Review of Systems denies shortness of breath.  Says that the edema is pitting somewhat.  Denies chest pain.     Objective:   Physical Exam  Says pulse has been 46 today.  No complaint of chest pain.  Main complaint is pitting edema.      Assessment & Plan:  Dependent edema-on carvedilol 6.25 mg twice daily  Bradycardia on carvedilol.  I would not think this low-dose of carvedilol would cause bradycardia at 46.  No complaint of chest pain or shortness of breath.  Plan: She is going to stop carvidilol.  She previously took lisinopril HCTZ 20/12.5 daily.  We will represcribed this.  It may  take a few days for edema to resolve.  She will contact me if bradycardia persist or if she develops chest pain or shortness of breath.  Suggest follow-up early next week.  She will need to call for appointment.  Time spent reviewing records, speaking with patient, medical decision making and E scribing new prescription is 15 minutes.

## 2020-05-17 NOTE — Progress Notes (Signed)
° °  Office Visit Note   Patient: Joanna Ray           Date of Birth: May 25, 1950           MRN: [id number] Visit Date: 05/17/2020 Requested by: [name], MD [address] [place],  [place] [address] PCP: [name], MD  Subjective: Chief Complaint  Patient presents with   Right Shoulder - Pain, Follow-up    No relief at all from the SA injection in November. Been going to PT. Still cannot raise the left arm up.    HPI: She is here for follow-up right shoulder pain and weakness.  Subacromial injection in November did not help with her pain.  She has been doing physical therapy and is not regaining any strength.  She still cannot raise her arm overhead.  She is very frustrated by her ongoing symptoms.  Denies any numbness or tingling in her hand.                ROS:   All other systems were reviewed and are negative.  Objective: Vital Signs: There were no vitals taken for this visit.  Physical Exam:  General:  Alert and oriented, in no acute distress. Pulm:  Breathing unlabored. Psy:  Normal mood, congruent affect.  Right shoulder: She has good passive range of motion, but actively she is very limited with deltoid and supraspinatus weakness.  She has weakness with external rotation as well, but internal rotation is better.  Biceps, triceps, wrist and intrinsic hand strength are normal.    Imaging: No results found.  Assessment & Plan: 1. persistent right shoulder pain and weakness, concerning for nerve compression. -We will order MRI scan to rule out rotator cuff tear, and nerve conduction studies.  Tramadol refilled to take as needed.     Procedures: No procedures performed        PMFS History: Patient Active Problem List   Diagnosis Date Noted   Angioedema 01/21/2019   Vertigo 09/28/2016   Hyperlipidemia 09/25/2011   Hypertension    Past Medical History:  Diagnosis Date   Anxiety    GERD (gastroesophageal reflux disease)     Hyperlipidemia    Hypertension    Urticaria     Family History  Problem Relation Age of Onset   Stroke Mother    Stroke Father    Diabetes Sister    Breast cancer Sister    Colon cancer Neg Hx    Colon polyps Neg Hx    Esophageal cancer Neg Hx    Rectal cancer Neg Hx    Stomach cancer Neg Hx     Past Surgical History:  Procedure Laterality Date   CATARACT EXTRACTION, BILATERAL Bilateral    DILATION AND CURETTAGE OF UTERUS     Social History   Occupational History   Occupation: Theatre manager  Tobacco Use   Smoking status: Former Smoker    Packs/day: 0.25    Years: 20.00    Pack years: 5.00    Types: Cigarettes    Quit date: 1980    Years since quitting: 42.0   Smokeless tobacco: Never Used  Scientific laboratory technician Use: Never used  Substance and Sexual Activity   Alcohol use: No   Drug use: No   Sexual activity: Not on file

## 2020-05-18 NOTE — Telephone Encounter (Signed)
Spoke with patient Friday. Changed BP meds with follow up needed in office in a few days.

## 2020-05-18 NOTE — Telephone Encounter (Signed)
See phone note

## 2020-05-20 NOTE — Telephone Encounter (Signed)
scheduled

## 2020-05-23 ENCOUNTER — Other Ambulatory Visit: Payer: Self-pay

## 2020-05-23 ENCOUNTER — Encounter: Payer: Self-pay | Admitting: Internal Medicine

## 2020-05-23 ENCOUNTER — Ambulatory Visit (INDEPENDENT_AMBULATORY_CARE_PROVIDER_SITE_OTHER): Payer: Medicare HMO | Admitting: Internal Medicine

## 2020-05-23 VITALS — BP 148/80 | HR 77 | Temp 97.7°F | Ht 65.0 in | Wt 201.0 lb

## 2020-05-23 DIAGNOSIS — I10 Essential (primary) hypertension: Secondary | ICD-10-CM

## 2020-05-23 DIAGNOSIS — R609 Edema, unspecified: Secondary | ICD-10-CM

## 2020-05-23 MED ORDER — VALSARTAN-HYDROCHLOROTHIAZIDE 160-25 MG PO TABS: 1.0000 | ORAL_TABLET | Freq: Every day | ORAL | 0 refills | Status: DC

## 2020-05-23 NOTE — Progress Notes (Signed)
   Subjective:    Patient ID: Joanna Ray, female    DOB: November 24, 1950, 70 y.o.   MRN: [id number]  HPI 70 year old female called late afternoon on January 21 complaining of heart rate of 46 and lower extremity edema.  Had been placed on Coreg 6.25 mg twice daily on January 11.  Had tried amlodipine but said it caused her legs to swell.  She used to take lisinopril and allergist thought it perhaps caused angioedema.  She was on lisinopril HCTZ at the time and had extensive allergy testing.  That worked well for her and she says that is the best blood pressure medicine she ever took.  When she called on January 21 we represcribed this lisinopril/HCTZ 20/12.5.  She is now here for follow-up.  Since her sister takes valsartan HCTZ and does well with that.  She thinks that might be a good choice at this time.  History of hyperlipidemia treated with Crestor.  History of anxiety treated with as needed Xanax  Review of Systems See above says edema is better    Objective:   Physical Exam Blood pressure 148/80 pulse 77 temperature 97.7 pulse oximetry 98% BMI 33.45  Neck is supple.  No carotid bruits.  Chest clear.  Cardiac exam regular rate and rhythm.     Assessment & Plan:   Discontinue lisinopril HCTZ.  Prescribed valsartan HCT 160/25 1 p.o. daily.  Follow-up February 1.  CPE scheduled for May.  Time spent reviewing previous records, medical decision making, discussing her previous treatment with her and medical decision making shared with her is 20 minutes.

## 2020-05-25 NOTE — Patient Instructions (Signed)
Changed to valsartan HCTZ 160/25 daily and follow-up February 1

## 2020-05-27 ENCOUNTER — Telehealth: Payer: Self-pay | Admitting: Internal Medicine

## 2020-05-27 NOTE — Telephone Encounter (Signed)
Would like for it to run 120- 130 most of the time. Continue to monitor it.

## 2020-05-27 NOTE — Telephone Encounter (Signed)
Joanna Ray [phone]  [name] called to give readings on new BP medication  2 hours after taking medication  Friday   147/74 Saturday 127/75 Sunday 127/72  147/78 afternoon Monday 125/72

## 2020-05-27 NOTE — Telephone Encounter (Signed)
Called and let patient know what Dr Baxley said, she verbalized understanding 

## 2020-05-28 ENCOUNTER — Ambulatory Visit: Payer: Medicare HMO | Admitting: Internal Medicine

## 2020-06-04 ENCOUNTER — Ambulatory Visit
Admission: RE | Admit: 2020-06-04 | Discharge: 2020-06-04 | Disposition: A | Discharge status: Home / Self Care | Payer: Medicare HMO | Source: Ambulatory Visit | Attending: Family Medicine | Admitting: Family Medicine

## 2020-06-04 DIAGNOSIS — M25511 Pain in right shoulder: Secondary | ICD-10-CM | POA: Diagnosis not present

## 2020-06-05 ENCOUNTER — Telehealth: Payer: Self-pay | Admitting: Family Medicine

## 2020-06-05 ENCOUNTER — Telehealth: Payer: Self-pay

## 2020-06-05 DIAGNOSIS — R2230 Localized swelling, mass and lump, unspecified upper limb: Secondary | ICD-10-CM

## 2020-06-05 NOTE — Telephone Encounter (Signed)
I spoke to Mrs. Lonzo about her right shoulder MRI scan.  The MRI is concerning for a proximal right humerus mass highly suspicious for metastatic disease or other carcinoma such as plasmacytoma.  Rotator cuff looks intact, there is mild osteoarthritis in the [facility] joint and a small ganglion cyst near the supraspinatus muscle.  Otherwise she is doing well.  Her shoulder pain is actually improving.  She denies any fevers, night sweats, unintentional weight change.  No other areas of pain.  I will arrange oncology consultation as soon as possible.  She will follow-up based on their recommendations.

## 2020-06-05 NOTE — Telephone Encounter (Signed)
Noted, I called patient.

## 2020-06-05 NOTE — Telephone Encounter (Signed)
See other note

## 2020-06-05 NOTE — Telephone Encounter (Signed)
Stacy with [facility] called to give report for MRI, right shoulder.  Please advise Dr. Junius Roads that report is in patient's chart.

## 2020-06-06 ENCOUNTER — Inpatient Hospital Stay: Payer: Medicare HMO

## 2020-06-06 ENCOUNTER — Other Ambulatory Visit: Payer: Self-pay | Admitting: Internal Medicine

## 2020-06-06 ENCOUNTER — Telehealth: Payer: Self-pay | Admitting: Internal Medicine

## 2020-06-06 ENCOUNTER — Inpatient Hospital Stay: Payer: Medicare HMO | Attending: Internal Medicine | Admitting: Internal Medicine

## 2020-06-06 ENCOUNTER — Other Ambulatory Visit: Payer: Self-pay

## 2020-06-06 VITALS — BP 160/81 | HR 66 | Temp 97.0°F | Resp 20 | Ht 65.0 in | Wt 197.3 lb

## 2020-06-06 DIAGNOSIS — K219 Gastro-esophageal reflux disease without esophagitis: Secondary | ICD-10-CM | POA: Diagnosis not present

## 2020-06-06 DIAGNOSIS — M899 Disorder of bone, unspecified: Secondary | ICD-10-CM | POA: Diagnosis not present

## 2020-06-06 DIAGNOSIS — Z7982 Long term (current) use of aspirin: Secondary | ICD-10-CM | POA: Insufficient documentation

## 2020-06-06 DIAGNOSIS — C9030 Solitary plasmacytoma not having achieved remission: Secondary | ICD-10-CM

## 2020-06-06 DIAGNOSIS — I10 Essential (primary) hypertension: Secondary | ICD-10-CM | POA: Insufficient documentation

## 2020-06-06 DIAGNOSIS — Z87891 Personal history of nicotine dependence: Secondary | ICD-10-CM | POA: Diagnosis not present

## 2020-06-06 DIAGNOSIS — Z803 Family history of malignant neoplasm of breast: Secondary | ICD-10-CM | POA: Insufficient documentation

## 2020-06-06 DIAGNOSIS — Z79899 Other long term (current) drug therapy: Secondary | ICD-10-CM | POA: Diagnosis not present

## 2020-06-06 DIAGNOSIS — F419 Anxiety disorder, unspecified: Secondary | ICD-10-CM | POA: Insufficient documentation

## 2020-06-06 DIAGNOSIS — R768 Other specified abnormal immunological findings in serum: Secondary | ICD-10-CM | POA: Diagnosis not present

## 2020-06-06 DIAGNOSIS — E785 Hyperlipidemia, unspecified: Secondary | ICD-10-CM | POA: Diagnosis not present

## 2020-06-06 DIAGNOSIS — C7951 Secondary malignant neoplasm of bone: Secondary | ICD-10-CM | POA: Diagnosis not present

## 2020-06-06 LAB — CBC WITH DIFFERENTIAL (CANCER CENTER ONLY)
Abs Immature Granulocytes: 0.02 10*3/uL (ref 0.00–0.07)
Basophils Absolute: 0.1 10*3/uL (ref 0.0–0.1)
Basophils Relative: 2 %
Eosinophils Absolute: 0 10*3/uL (ref 0.0–0.5)
Eosinophils Relative: 0 %
HCT: 38.4 % (ref 36.0–46.0)
Hemoglobin: 12.7 g/dL (ref 12.0–15.0)
Immature Granulocytes: 0 %
Lymphocytes Relative: 31 %
Lymphs Abs: 1.8 10*3/uL (ref 0.7–4.0)
MCH: 31.1 pg (ref 26.0–34.0)
MCHC: 33.1 g/dL (ref 30.0–36.0)
MCV: 94.1 fL (ref 80.0–100.0)
Monocytes Absolute: 0.6 10*3/uL (ref 0.1–1.0)
Monocytes Relative: 10 %
Neutro Abs: 3.2 10*3/uL (ref 1.7–7.7)
Neutrophils Relative %: 57 %
Platelet Count: 280 10*3/uL (ref 150–400)
RBC: 4.08 MIL/uL (ref 3.87–5.11)
RDW: 12.6 % (ref 11.5–15.5)
WBC Count: 5.7 10*3/uL (ref 4.0–10.5)
nRBC: 0 % (ref 0.0–0.2)

## 2020-06-06 LAB — CMP (CANCER CENTER ONLY)
ALT: 10 U/L (ref 0–44)
AST: 15 U/L (ref 15–41)
Albumin: 4.4 g/dL (ref 3.5–5.0)
Alkaline Phosphatase: 73 U/L (ref 38–126)
Anion gap: 10 (ref 5–15)
BUN: 17 mg/dL (ref 8–23)
CO2: 29 mmol/L (ref 22–32)
Calcium: 9.9 mg/dL (ref 8.9–10.3)
Chloride: 101 mmol/L (ref 98–111)
Creatinine: 1.11 mg/dL — ABNORMAL HIGH (ref 0.44–1.00)
GFR, Estimated: 54 mL/min — ABNORMAL LOW (ref >60)
Glucose, Bld: 100 mg/dL — ABNORMAL HIGH (ref 70–99)
Potassium: 3 mmol/L — ABNORMAL LOW (ref 3.5–5.1)
Sodium: 140 mmol/L (ref 135–145)
Total Bilirubin: 0.7 mg/dL (ref 0.3–1.2)
Total Protein: 9.5 g/dL — ABNORMAL HIGH (ref 6.5–8.1)

## 2020-06-06 LAB — CEA (IN HOUSE-CHCC): CEA (CHCC-In House): <1 ng/mL (ref 0.00–5.00)

## 2020-06-06 LAB — LACTATE DEHYDROGENASE: LDH: 184 U/L (ref 98–192)

## 2020-06-06 NOTE — Progress Notes (Signed)
[facility] [phone]   [phone]  CONSULT NOTE  REFERRING PHYSICIAN: Dr. Legrand Como Hilts  REASON FOR CONSULTATION:  70 years old African-American female with questionable bone metastasis in the right humerus.  HPI Joanna Ray is a 70 y.o. female with past medical history significant for anxiety, GERD, dyslipidemia, hypertension as well as urticaria.  The patient also has remote history of short.  For smoking but quit 30 years ago.  The patient mentioned that in October 2021 she started having pain on the right shoulder area.  She was thought initially to have bursitis and she underwent physical therapy with initial improvement but her pain started getting worse again.  She was seen by her orthopedic surgeon Dr. Junius Roads and he ordered MRI of the right shoulder which was performed on 06/02/2020 and it showed an expansible lesion in the proximal diaphysis and metaphysis of the humerus which measured 5.3 x 3.5 x 3.0 cm.  The lesion extends through the cortex medially and posteriorly.  There is some edema surrounding the lesion and no other local bony lesions identified.  The MRI also showed a cyst measuring 1.3 x 0.6 x 0.9 cm inferior to the supraspinatus consistent with a ganglion. The patient was referred to me today for evaluation and recommendation regarding her condition and treatment options. The patient has been up-to-date with her screening work-up including screening mammogram performed on 04/12/2019 that was unremarkable for any suspicious lesion.  She did not have any screening colonoscopy.  She was planning to have 1 few years ago but this was delayed because of the Covid pandemic. The patient denied having any complaints today except for the pain on the right shoulder area.  She has no chest pain, shortness of breath, cough or hemoptysis.  She denied having any fever or chills.  She has no nausea, vomiting, diarrhea or constipation.  She denied  having any other areas of painful bone disease. Family history significant for mother and father with stroke.  She has a half sister with breast cancer. The patient is married and had 4 children, 3 living and one deceased in a car accident.  The patient works as a Pharmacist, hospital for [employer].  She has a history of smoking less than 1 pack/day for around 10-15 years but quit 30 years ago.  No history of alcohol or drug abuse.  HPI  Past Medical History:  Diagnosis Date  . Anxiety   . GERD (gastroesophageal reflux disease)   . Hyperlipidemia   . Hypertension   . Urticaria     Past Surgical History:  Procedure Laterality Date  . CATARACT EXTRACTION, BILATERAL Bilateral   . DILATION AND CURETTAGE OF UTERUS      Family History  Problem Relation Age of Onset  . Stroke Mother   . Stroke Father   . Diabetes Sister   . Breast cancer Sister   . Colon cancer Neg Hx   . Colon polyps Neg Hx   . Esophageal cancer Neg Hx   . Rectal cancer Neg Hx   . Stomach cancer Neg Hx     Social History Social History   Tobacco Use  . Smoking status: Former Smoker    Packs/day: 0.25    Years: 20.00    Pack years: 5.00    Types: Cigarettes    Quit date: 1980    Years since quitting: 42.1  . Smokeless tobacco: Never Used  Vaping Use  . Vaping Use: Never used  Substance Use Topics  . Alcohol use: No  . Drug use: No    Allergies  Allergen Reactions  . Shellfish Allergy Swelling    Swelling of lips    Current Outpatient Medications  Medication Sig Dispense Refill  . ALPRAZolam (XANAX) 0.5 MG tablet Take 1 tablet by mouth twice daily as needed for anxiety (Patient taking differently: Take 0.5 mg by mouth at bedtime.) 60 tablet 2  . aspirin 81 MG tablet Take 81 mg by mouth daily.    . cholecalciferol (VITAMIN D) 1000 UNITS tablet Take 1,000 Units by mouth daily.    Marland Kitchen EPINEPHrine 0.3 mg/0.3 mL IJ SOAJ injection Inject 0.3 mLs (0.3 mg total) into the muscle as needed for anaphylaxis. 1  each 5  . fexofenadine (ALLEGRA) 180 MG tablet Take 1 tablet (180 mg total) by mouth daily. 90 tablet 1  . rosuvastatin (CRESTOR) 20 MG tablet Take 1/2 (one-half) tablet by mouth once daily 45 tablet 1  . traMADol (ULTRAM) 50 MG tablet Take 1 tablet (50 mg total) by mouth every 6 (six) hours as needed. 30 tablet 0  . valsartan-hydrochlorothiazide (DIOVAN HCT) 160-25 MG tablet Take 1 tablet by mouth daily. 30 tablet 0  . vitamin E 100 UNIT capsule Take 100 Units by mouth daily.     No current facility-administered medications for this visit.    Review of Systems  Constitutional: positive for fatigue Eyes: negative Ears, nose, mouth, throat, and face: negative Respiratory: negative Cardiovascular: negative Gastrointestinal: negative Genitourinary:negative Integument/breast: negative Hematologic/lymphatic: negative Musculoskeletal:positive for bone pain Neurological: negative Behavioral/Psych: negative Endocrine: negative Allergic/Immunologic: negative  Physical Exam  PJS:RPRXY, healthy, no distress, well nourished, well developed and anxious SKIN: skin color, texture, turgor are normal, no rashes or significant lesions HEAD: Normocephalic, No masses, lesions, tenderness or abnormalities EYES: normal, PERRLA, Conjunctiva are pink and non-injected EARS: External ears normal, Canals clear OROPHARYNX:no exudate, no erythema and lips, buccal mucosa, and tongue normal  NECK: supple, no adenopathy, no JVD LYMPH:  no palpable lymphadenopathy, no hepatosplenomegaly BREAST:not examined LUNGS: clear to auscultation , and palpation HEART: regular rate & rhythm, no murmurs and no gallops ABDOMEN:abdomen soft, non-tender, normal bowel sounds and no masses or organomegaly BACK: No CVA tenderness, Range of motion is normal EXTREMITIES:no joint deformities, effusion, or inflammation, no edema  NEURO: alert & oriented x 3 with fluent speech, no focal motor/sensory deficits  PERFORMANCE  STATUS: ECOG 1  LABORATORY DATA: Lab Results  Component Value Date   WBC 5.7 06/06/2020   HGB 12.7 06/06/2020   HCT 38.4 06/06/2020   MCV 94.1 06/06/2020   PLT 280 06/06/2020      Chemistry      Component Value Date/Time   NA 140 05/03/2020 0909   NA 145 (H) 12/28/2018 1436   K 4.6 05/03/2020 0909   CL 101 05/03/2020 0909   CO2 32 05/03/2020 0909   BUN 18 05/03/2020 0909   BUN 15 12/28/2018 1436   CREATININE 1.20 (H) 05/03/2020 0909      Component Value Date/Time   CALCIUM 9.9 05/03/2020 0909   ALKPHOS 54 12/28/2018 1436   AST 18 03/04/2020 1107   AST 18 03/04/2020 1107   ALT 14 03/04/2020 1107   ALT 14 03/04/2020 1107   BILITOT 0.7 03/04/2020 1107   BILITOT 0.7 03/04/2020 1107   BILITOT 0.8 12/28/2018 1436       RADIOGRAPHIC STUDIES: MR SHOULDER RIGHT WO CONTRAST  Result Date: 06/05/2020 CLINICAL DATA:  Onset right shoulder pain, weakness and  limited range of motion in October, 2021. No known injury. EXAM: MRI OF THE RIGHT SHOULDER WITHOUT CONTRAST TECHNIQUE: Multiplanar, multisequence MR imaging of the shoulder was performed. No intravenous contrast was administered. COMPARISON:  None. FINDINGS: Rotator cuff:  Intact with moderate tendinopathy noted. Muscles: No atrophy. A cyst measuring 1.3 cm transverse by 0.6 cm craniocaudal by 0.9 cm AP inferior to the supraspinatus is most consistent with a ganglion. Mild edema is seen subjacent to all of the rotator cuff muscles. Biceps long head: Intact. There is tendinopathy of the intra-articular segment. Acromioclavicular Joint: Mild-to-moderate osteoarthritis. Type 2 acromion. Meso-acromion type os acromiale with degenerative change about the synchondrosis noted. Glenohumeral Joint: Mild degenerative disease. Labrum:  Appears intact. Bones: There is an expansile lesion in the proximal diaphysis and metaphysis of the humerus which measures 5.3 cm craniocaudal by approximately 3.5 cm AP x 3 cm transverse. The lesion extends through  the cortex medially and posteriorly. There is some edema surrounding the lesion. No other focal bony lesion is identified. Other: None. IMPRESSION: Mass in the proximal right humerus has an appearance highly suspicious for metastatic disease or other carcinoma such as plasmacytoma. Rotator cuff and intra-articular long head of biceps tendinopathy without tear. Mild to moderate acromioclavicular osteoarthritis. Os acromiale also noted. Small ganglion cyst subjacent to the supraspinatus muscle belly. Mild edema in the rotator cuff musculature is that mild edema along the inferior aspect of the rotator cuff musculature may be due to strain. These results will be called to the ordering clinician or representative by the Radiologist Assistant, and communication documented in the PACS or [employer]. Electronically Signed   By: Inge Rise M.D.   On: 06/05/2020 10:54    ASSESSMENT: This is a very pleasant 70 years old African-American female who has been complaining of right shoulder pain and recent MRI of the shoulder showed large metastatic lesion in the proximal right humerus.  The source of this metastatic lesion is not clear at this point based on the recent MRI.   PLAN: I had a lengthy discussion with the patient today about her current condition and further investigation to confirm her diagnosis. I recommended for the patient to have CT scan of the chest, abdomen pelvis to identify the primary etiology of the metastatic lesion.  I also ordered a myeloma panel to rule out any suspicious plasmacytoma or multiple myeloma as the source of this lesion. If the scans are negative, will consider the patient for CT-guided core biopsy of the right humerus lesion for tissue diagnosis. I will see the patient back for follow-up visit in 1 week for evaluation and more detailed discussion of her treatment options based on the scan results. For pain management, the patient will continue her current treatment  with tramadol as prescribed by her primary care physician and orthopedic surgeon. She was advised to call immediately if she has any other concerning symptoms in the interval. The patient voices understanding of current disease status and treatment options and is in agreement with the current care plan.  All questions were answered. The patient knows to call the clinic with any problems, questions or concerns. We can certainly see the patient much sooner if necessary.  Thank you so much for allowing me to participate in the care of Joanna Ray. I will continue to follow up the patient with you and assist in her care.  The total time spent in the appointment was 70 minutes.  Disclaimer: This note was dictated with voice recognition  software. Similar sounding words can inadvertently be transcribed and may not be corrected upon review.   Eilleen Kempf June 06, 2020, 2:24 PM

## 2020-06-06 NOTE — Telephone Encounter (Signed)
Received a new hem referral from Dr. Junius Roads for shoulder mass. Joanna Ray has been cld and scheduled to see Dr. Julien Nordmann today at 2:30pm w/labs at 2pm. Pt aware to arrive 15 minutes early.

## 2020-06-10 ENCOUNTER — Telehealth: Payer: Self-pay | Admitting: Internal Medicine

## 2020-06-10 LAB — MULTIPLE MYELOMA PANEL, SERUM
Albumin SerPl Elph-Mcnc: 4.3 g/dL (ref 2.9–4.4)
Albumin/Glob SerPl: 1 (ref 0.7–1.7)
Alpha 1: 0.3 g/dL (ref 0.0–0.4)
Alpha2 Glob SerPl Elph-Mcnc: 0.8 g/dL (ref 0.4–1.0)
B-Globulin SerPl Elph-Mcnc: 1 g/dL (ref 0.7–1.3)
Gamma Glob SerPl Elph-Mcnc: 2.3 g/dL — ABNORMAL HIGH (ref 0.4–1.8)
Globulin, Total: 4.4 g/dL — ABNORMAL HIGH (ref 2.2–3.9)
IgA: 124 mg/dL (ref 87–352)
IgG (Immunoglobin G), Serum: 2783 mg/dL — ABNORMAL HIGH (ref 586–1602)
IgM (Immunoglobulin M), Srm: 61 mg/dL (ref 26–217)
M Protein SerPl Elph-Mcnc: 1.9 g/dL — ABNORMAL HIGH
Total Protein ELP: 8.7 g/dL — ABNORMAL HIGH (ref 6.0–8.5)

## 2020-06-10 NOTE — Telephone Encounter (Signed)
Scheduled per 2/10 los. Called and spoke with pt, confirmed 2/15 and 2/16 appts with pt

## 2020-06-11 ENCOUNTER — Other Ambulatory Visit: Payer: Self-pay

## 2020-06-11 ENCOUNTER — Other Ambulatory Visit: Payer: Self-pay | Admitting: Medical Oncology

## 2020-06-11 ENCOUNTER — Inpatient Hospital Stay: Payer: Medicare HMO

## 2020-06-11 ENCOUNTER — Other Ambulatory Visit: Payer: Self-pay | Admitting: Internal Medicine

## 2020-06-11 ENCOUNTER — Inpatient Hospital Stay (HOSPITAL_BASED_OUTPATIENT_CLINIC_OR_DEPARTMENT_OTHER): Payer: Medicare HMO | Admitting: Internal Medicine

## 2020-06-11 VITALS — BP 148/91 | HR 64 | Temp 97.8°F | Resp 17 | Ht 65.0 in | Wt 199.2 lb

## 2020-06-11 DIAGNOSIS — C7951 Secondary malignant neoplasm of bone: Secondary | ICD-10-CM

## 2020-06-11 DIAGNOSIS — I10 Essential (primary) hypertension: Secondary | ICD-10-CM | POA: Diagnosis not present

## 2020-06-11 DIAGNOSIS — Z79899 Other long term (current) drug therapy: Secondary | ICD-10-CM | POA: Diagnosis not present

## 2020-06-11 DIAGNOSIS — Z7982 Long term (current) use of aspirin: Secondary | ICD-10-CM | POA: Diagnosis not present

## 2020-06-11 DIAGNOSIS — R768 Other specified abnormal immunological findings in serum: Secondary | ICD-10-CM | POA: Diagnosis not present

## 2020-06-11 DIAGNOSIS — C9000 Multiple myeloma not having achieved remission: Secondary | ICD-10-CM | POA: Diagnosis not present

## 2020-06-11 DIAGNOSIS — F419 Anxiety disorder, unspecified: Secondary | ICD-10-CM | POA: Diagnosis not present

## 2020-06-11 DIAGNOSIS — K219 Gastro-esophageal reflux disease without esophagitis: Secondary | ICD-10-CM | POA: Diagnosis not present

## 2020-06-11 DIAGNOSIS — M899 Disorder of bone, unspecified: Secondary | ICD-10-CM | POA: Diagnosis not present

## 2020-06-11 DIAGNOSIS — E785 Hyperlipidemia, unspecified: Secondary | ICD-10-CM | POA: Diagnosis not present

## 2020-06-11 DIAGNOSIS — Z87891 Personal history of nicotine dependence: Secondary | ICD-10-CM | POA: Diagnosis not present

## 2020-06-11 NOTE — Progress Notes (Signed)
[facility] [phone]   [phone]  OFFICE PROGRESS NOTE  [name], MD [address] [place] [address]  DIAGNOSIS: Suspicious multiple myeloma.  PRIOR THERAPY: None  CURRENT THERAPY: None  INTERVAL HISTORY: Joanna Ray 70 y.o. female returns to the clinic today for follow-up visit.  The patient is feeling fine today with no concerning complaints except for the pain in the right arm.  The patient denied having any current chest pain, shortness of breath, cough or hemoptysis.  She denied having any nausea, vomiting, diarrhea or constipation.  She has no headache or visual changes.  She was found to have suspicious bone lesion in the right humerus.  She is currently undergoing work-up for evaluation of this lesion.  Initial blood work including SPEP with immunofixation is suspicious for multiple myeloma.  I canceled her CT scan of the chest, abdomen and pelvis.  The patient is here today for reevaluation and further recommendation regarding her condition.  MEDICAL HISTORY: Past Medical History:  Diagnosis Date  . Anxiety   . GERD (gastroesophageal reflux disease)   . Hyperlipidemia   . Hypertension   . Urticaria     ALLERGIES:  is allergic to shellfish allergy.  MEDICATIONS:  Current Outpatient Medications  Medication Sig Dispense Refill  . ALPRAZolam (XANAX) 0.5 MG tablet Take 1 tablet by mouth twice daily as needed for anxiety (Patient taking differently: Take 0.5 mg by mouth at bedtime.) 60 tablet 2  . aspirin 81 MG tablet Take 81 mg by mouth daily.    . cholecalciferol (VITAMIN D) 1000 UNITS tablet Take 1,000 Units by mouth daily.    Marland Kitchen EPINEPHrine 0.3 mg/0.3 mL IJ SOAJ injection Inject 0.3 mLs (0.3 mg total) into the muscle as needed for anaphylaxis. 1 each 5  . fexofenadine (ALLEGRA) 180 MG tablet Take 1 tablet (180 mg total) by mouth daily. 90 tablet 1  . rosuvastatin (CRESTOR) 20 MG tablet Take 1/2  (one-half) tablet by mouth once daily 45 tablet 1  . traMADol (ULTRAM) 50 MG tablet Take 1 tablet (50 mg total) by mouth every 6 (six) hours as needed. 30 tablet 0  . valsartan-hydrochlorothiazide (DIOVAN HCT) 160-25 MG tablet Take 1 tablet by mouth daily. 30 tablet 0  . vitamin E 100 UNIT capsule Take 100 Units by mouth daily.     No current facility-administered medications for this visit.    SURGICAL HISTORY:  Past Surgical History:  Procedure Laterality Date  . CATARACT EXTRACTION, BILATERAL Bilateral   . DILATION AND CURETTAGE OF UTERUS      REVIEW OF SYSTEMS:  Constitutional: positive for fatigue Eyes: negative Ears, nose, mouth, throat, and face: negative Respiratory: negative Cardiovascular: negative Gastrointestinal: negative Genitourinary:negative Integument/breast: negative Hematologic/lymphatic: negative Musculoskeletal:positive for bone pain Neurological: negative Behavioral/Psych: negative Endocrine: negative Allergic/Immunologic: negative   PHYSICAL EXAMINATION: General appearance: alert, cooperative, fatigued and no distress Head: Normocephalic, without obvious abnormality, atraumatic Neck: no adenopathy, no JVD, supple, symmetrical, trachea midline and thyroid not enlarged, symmetric, no tenderness/mass/nodules Lymph nodes: Cervical, supraclavicular, and axillary nodes normal. Resp: clear to auscultation bilaterally Back: symmetric, no curvature. ROM normal. No CVA tenderness. Cardio: regular rate and rhythm, S1, S2 normal, no murmur, click, rub or gallop GI: soft, non-tender; bowel sounds normal; no masses,  no organomegaly Extremities: extremities normal, atraumatic, no cyanosis or edema Neurologic: Alert and oriented X 3, normal strength and tone. Normal symmetric reflexes. Normal coordination and gait  ECOG PERFORMANCE STATUS: 1 -  Symptomatic but completely ambulatory  Blood pressure (!) 148/91, pulse 64, temperature 97.8 F (36.6 C), temperature  source Tympanic, resp. rate 17, height 5' 5"  (1.651 m), weight 199 lb 3.2 oz (90.4 kg), SpO2 100 %.  LABORATORY DATA: Lab Results  Component Value Date   WBC 5.7 06/06/2020   HGB 12.7 06/06/2020   HCT 38.4 06/06/2020   MCV 94.1 06/06/2020   PLT 280 06/06/2020      Chemistry      Component Value Date/Time   NA 140 06/06/2020 1400   NA 145 (H) 12/28/2018 1436   K 3.0 (L) 06/06/2020 1400   CL 101 06/06/2020 1400   CO2 29 06/06/2020 1400   BUN 17 06/06/2020 1400   BUN 15 12/28/2018 1436   CREATININE 1.11 (H) 06/06/2020 1400   CREATININE 1.20 (H) 05/03/2020 0909      Component Value Date/Time   CALCIUM 9.9 06/06/2020 1400   ALKPHOS 73 06/06/2020 1400   AST 15 06/06/2020 1400   ALT 10 06/06/2020 1400   BILITOT 0.7 06/06/2020 1400       RADIOGRAPHIC STUDIES: MR SHOULDER RIGHT WO CONTRAST  Result Date: 06/05/2020 CLINICAL DATA:  Onset right shoulder pain, weakness and limited range of motion in October, 2021. No known injury. EXAM: MRI OF THE RIGHT SHOULDER WITHOUT CONTRAST TECHNIQUE: Multiplanar, multisequence MR imaging of the shoulder was performed. No intravenous contrast was administered. COMPARISON:  None. FINDINGS: Rotator cuff:  Intact with moderate tendinopathy noted. Muscles: No atrophy. A cyst measuring 1.3 cm transverse by 0.6 cm craniocaudal by 0.9 cm AP inferior to the supraspinatus is most consistent with a ganglion. Mild edema is seen subjacent to all of the rotator cuff muscles. Biceps long head: Intact. There is tendinopathy of the intra-articular segment. Acromioclavicular Joint: Mild-to-moderate osteoarthritis. Type 2 acromion. Meso-acromion type os acromiale with degenerative change about the synchondrosis noted. Glenohumeral Joint: Mild degenerative disease. Labrum:  Appears intact. Bones: There is an expansile lesion in the proximal diaphysis and metaphysis of the humerus which measures 5.3 cm craniocaudal by approximately 3.5 cm AP x 3 cm transverse. The lesion  extends through the cortex medially and posteriorly. There is some edema surrounding the lesion. No other focal bony lesion is identified. Other: None. IMPRESSION: Mass in the proximal right humerus has an appearance highly suspicious for metastatic disease or other carcinoma such as plasmacytoma. Rotator cuff and intra-articular long head of biceps tendinopathy without tear. Mild to moderate acromioclavicular osteoarthritis. Os acromiale also noted. Small ganglion cyst subjacent to the supraspinatus muscle belly. Mild edema in the rotator cuff musculature is that mild edema along the inferior aspect of the rotator cuff musculature may be due to strain. These results will be called to the ordering clinician or representative by the Radiologist Assistant, and communication documented in the PACS or [employer]. Electronically Signed   By: Inge Rise M.D.   On: 06/05/2020 10:54    ASSESSMENT AND PLAN: This is a very pleasant 70 years old African-American female with likely multiple myeloma pending further investigation and confirmation of the diagnosis.  The patient presented with bone lesion and the right humerus suspicious for plasmacytoma. Initial blood work showed elevated serum protein as well as M spike on the serum protein electrophoresis with elevated IgG level. I canceled the CT scan of the chest, abdomen pelvis that was ordered before. I will arrange for the patient to have CT-guided bone marrow biopsy and aspirate for confirmation of her diagnosis. I also order full myeloma panel  today with 24-hour urine protein collection for light chain. I also order skeletal bone survey. I will see the patient back for follow-up visit in less than 2 weeks for further evaluation and discussion of her treatment options based on the pending studies. She was advised to call immediately if she has any concerning symptoms in the interval. The patient voices understanding of current disease status and  treatment options and is in agreement with the current care plan.  All questions were answered. The patient knows to call the clinic with any problems, questions or concerns. We can certainly see the patient much sooner if necessary.  The total time spent in the appointment was 30 minutes.  Disclaimer: This note was dictated with voice recognition software. Similar sounding words can inadvertently be transcribed and may not be corrected upon review.

## 2020-06-11 NOTE — Patient Instructions (Signed)
Multiple Myeloma  Multiple myeloma is a form of cancer. It develops when abnormal plasma cells grow out of control. Plasma cells are a type of white blood cell that is made in the soft tissue inside the bones (bone marrow). They are part of the body's disease-fighting system (immune system). Multiple myeloma damages bones and causes other health problems because of its effect on blood cells. Abnormal plasma cells produce monoclonal proteins (M proteins) and interfere with many important functions that normal cells perform in the body. The disease gets worse over time (progresses) and reduces the body's ability to fight infections. What are the causes? The cause of multiple myeloma is not known. What increases the risk? You are more likely to develop this condition if you:  Are older than age 65.  Are female.  Are African American.  Have a family history of multiple myeloma.  Have a history of monoclonal gammopathy of undetermined significance (MGUS).  Have a history of radiation exposure.  Have been exposed to certain chemicals, such as benzene or pesticides. What are the signs or symptoms? Signs and symptoms of multiple myeloma may include:  Bone pain, especially in the back, ribs, and hips.  Broken bones (fractures).  Having a low level of red blood cells (anemia), white blood cells (leukopenia), and platelets (thrombocytopenia). Platelets are cells that help blood to clot so a wound does not keep bleeding.  Fatigue.  Weakness.  Infections.  Unusual bleeding, such as: ? Bleeding from the nose or gums. ? Bleeding a lot from a small scrape or cut.  High blood calcium levels.  Increased urination.  Confusion.  Shortness of breath.  Weakness or numbness in your legs.  Sudden, severe back pain. How is this diagnosed? This condition is diagnosed based on your symptoms, your medical history, and a physical exam. You will have blood and urine tests to confirm that M  proteins are present. You may also have other tests, including:  Additional blood tests.  X-rays.  MRI.  CT scan.  PET scan.  Tests to check the function of your kidneys.  Heart tests, such as an echocardiogram. An echocardiogram uses sound waves to produce an image of the heart.  A procedure to remove a sample of bone marrow (bone marrow biopsy). The sample is examined for abnormal plasma cells. How is this treated? There is no cure for multiple myeloma. However, treatments can manage symptoms and slow the progression of the disease. Treatment options may vary depending on how much the disease has advanced. Possible treatment options may include:  Medicines that kill cancer cells (chemotherapy).  Radiation therapy. This is the use of high-energy rays to kill cancer cells.  A bone marrow transplant. This procedure replaces diseased bone marrow with healthy bone marrow (stem cell transplant).  Medicines that block the growth and spread of cancer cells (targeted drug therapy).  Medicines that strengthen your immune system's ability to fight cancer cells (immunotherapy or biologic therapy).  Participating in clinical trials to find out if new (experimental) treatments are effective.  Medicines that help to prevent bone damage (bisphosphonates).  Medicines that reduce swelling (corticosteroids).  Surgery to repair bone damage.  A procedure to remove plasma cells from your blood (plasmapheresis).  Other medicines to treat problems such as infections or pain. Follow these instructions at home: Eating and drinking  Drink enough fluid to keep your urine pale yellow.  Try to eat healthy meals on a regular basis. Some of your treatments might affect your appetite.   If you are having problems eating or if you do not have an appetite, meet with a diet and nutrition specialist (dietitian).  Take vitamins or supplements only as told by your health care provider or dietitian. Some  vitamins and supplements may interfere with how well your treatment works.   General instructions  Take over-the-counter and prescription medicines only as told by your health care provider.  Stay active. Talk with your health care provider about what types of exercises and activities are safe for you. ? Avoid activities that cause increased pain. ? Do not lift anything that is heavier than 10 lb (4.5 kg), or the limit that you are told, until your health care provider says that it is safe.  Consider joining a support group or getting counseling to help you cope with the stress of having multiple myeloma.  Keep all follow-up visits as told by your health care provider. This is important. Where to find more information  American Cancer Society: www.cancer.org  Leukemia and Lymphoma Society: www.LLS.[place] www.cancer.gov Contact a health care provider if you:  Have pain that gets worse or does not get better with medicine.  Have a fever.  Have swollen legs.  Have weakness or dizziness.  Have unexplained weight loss.  Have unexplained bleeding or bruising.  Have a cough or symptoms of the common cold.  Feel depressed.  Have changes in urination or bowel movements. Get help right away if you:  Have sudden severe pain, especially back pain.  Have numbness or weakness in your arms, hands, legs, or feet.  Become very confused.  Have weakness on one side of your body.  Have slurred speech.  Have trouble staying awake.  Have shortness of breath.  Have blood in your stool (feces) or urine.  Vomit blood or cough up blood. Summary  Multiple myeloma is a form of cancer. It develops when abnormal plasma cells grow out of control.  There is no cure for multiple myeloma. However, treatments can manage symptoms and slow the progression of the disease. Treatment options may vary depending on how much the disease has advanced.  Do not lift  anything that is heavier than 10 lb (4.5 kg), or the limit that you are told, until your health care provider says that it is safe.  Contact your health care provider if you have any new symptoms or sudden severe pain, especially back pain. This information is not intended to replace advice given to you by your health care provider. Make sure you discuss any questions you have with your health care provider. Document Revised: 09/04/2019 Document Reviewed: 09/04/2019 Elsevier Patient Education  [place].

## 2020-06-12 ENCOUNTER — Inpatient Hospital Stay: Payer: Medicare HMO | Admitting: Internal Medicine

## 2020-06-12 ENCOUNTER — Telehealth: Payer: Self-pay | Admitting: Internal Medicine

## 2020-06-12 DIAGNOSIS — F419 Anxiety disorder, unspecified: Secondary | ICD-10-CM | POA: Diagnosis not present

## 2020-06-12 DIAGNOSIS — Z79899 Other long term (current) drug therapy: Secondary | ICD-10-CM | POA: Diagnosis not present

## 2020-06-12 DIAGNOSIS — M899 Disorder of bone, unspecified: Secondary | ICD-10-CM | POA: Diagnosis not present

## 2020-06-12 DIAGNOSIS — E785 Hyperlipidemia, unspecified: Secondary | ICD-10-CM | POA: Diagnosis not present

## 2020-06-12 DIAGNOSIS — Z7982 Long term (current) use of aspirin: Secondary | ICD-10-CM | POA: Diagnosis not present

## 2020-06-12 DIAGNOSIS — Z87891 Personal history of nicotine dependence: Secondary | ICD-10-CM | POA: Diagnosis not present

## 2020-06-12 DIAGNOSIS — R768 Other specified abnormal immunological findings in serum: Secondary | ICD-10-CM | POA: Diagnosis not present

## 2020-06-12 DIAGNOSIS — K219 Gastro-esophageal reflux disease without esophagitis: Secondary | ICD-10-CM | POA: Diagnosis not present

## 2020-06-12 DIAGNOSIS — I10 Essential (primary) hypertension: Secondary | ICD-10-CM | POA: Diagnosis not present

## 2020-06-12 LAB — KAPPA/LAMBDA LIGHT CHAINS
Kappa free light chain: 111.1 mg/L — ABNORMAL HIGH (ref 3.3–19.4)
Kappa, lambda light chain ratio: 14.43 — ABNORMAL HIGH (ref 0.26–1.65)
Lambda free light chains: 7.7 mg/L (ref 5.7–26.3)

## 2020-06-12 LAB — IGG, IGA, IGM
IgA: 117 mg/dL (ref 87–352)
IgG (Immunoglobin G), Serum: 2426 mg/dL — ABNORMAL HIGH (ref 586–1602)
IgM (Immunoglobulin M), Srm: 57 mg/dL (ref 26–217)

## 2020-06-12 LAB — BETA 2 MICROGLOBULIN, SERUM: Beta-2 Microglobulin: 2 mg/L (ref 0.6–2.4)

## 2020-06-12 NOTE — Telephone Encounter (Signed)
Scheduled appts per 2/15 los. Pt confirmed appt date and time.  

## 2020-06-14 ENCOUNTER — Ambulatory Visit (HOSPITAL_COMMUNITY): Admission: RE | Admit: 2020-06-14 | Payer: Medicare HMO | Source: Ambulatory Visit

## 2020-06-14 LAB — UPEP/UIFE/LIGHT CHAINS/TP, 24-HR UR
% BETA, Urine: 0 %
ALPHA 1 URINE: 0 %
Albumin, U: 100 %
Alpha 2, Urine: 0 %
Free Kappa Lt Chains,Ur: 2.69 mg/L (ref 0.63–113.79)
Free Kappa/Lambda Ratio: >4.01 (ref 1.03–31.76)
Free Lambda Lt Chains,Ur: <0.67 mg/L (ref 0.47–11.77)
GAMMA GLOBULIN URINE: 0 %
Total Protein, Urine-Ur/day: 75 mg/24 hr (ref 30–150)
Total Protein, Urine: 5.8 mg/dL
Total Volume: 1300

## 2020-06-16 ENCOUNTER — Other Ambulatory Visit: Payer: Self-pay | Admitting: Internal Medicine

## 2020-06-16 DIAGNOSIS — H8110 Benign paroxysmal vertigo, unspecified ear: Secondary | ICD-10-CM

## 2020-06-16 DIAGNOSIS — F4321 Adjustment disorder with depressed mood: Secondary | ICD-10-CM

## 2020-06-18 ENCOUNTER — Other Ambulatory Visit: Payer: Self-pay | Admitting: Internal Medicine

## 2020-06-21 ENCOUNTER — Other Ambulatory Visit: Payer: Self-pay

## 2020-06-21 ENCOUNTER — Telehealth: Payer: Self-pay

## 2020-06-21 ENCOUNTER — Encounter: Payer: Self-pay | Admitting: Physical Medicine and Rehabilitation

## 2020-06-21 ENCOUNTER — Ambulatory Visit (INDEPENDENT_AMBULATORY_CARE_PROVIDER_SITE_OTHER): Payer: Medicare HMO | Admitting: Physical Medicine and Rehabilitation

## 2020-06-21 DIAGNOSIS — R531 Weakness: Secondary | ICD-10-CM

## 2020-06-21 DIAGNOSIS — R202 Paresthesia of skin: Secondary | ICD-10-CM

## 2020-06-21 NOTE — Telephone Encounter (Signed)
Pt left a message requesting a call.  I have called the pt back and left a message requesting a return call and if she receives the voicemail, to please state the reason for her call and her DOB.

## 2020-06-21 NOTE — Progress Notes (Signed)
Unable to lift right arm. Right shoulder pain. No numbness or tingling. Right hand dominant No lotion per patient Numeric Pain Rating Scale and Functional Assessment Average Pain 7   In the last MONTH (on 0-10 scale) has pain interfered with the following?  1. General activity like being  able to carry out your everyday physical activities such as walking, climbing stairs, carrying groceries, or moving a chair?  Rating(10)

## 2020-06-24 ENCOUNTER — Other Ambulatory Visit: Payer: Self-pay | Admitting: Student

## 2020-06-24 ENCOUNTER — Other Ambulatory Visit: Payer: Self-pay | Admitting: Radiology

## 2020-06-24 NOTE — Procedures (Signed)
EMG & NCV Findings: Evaluation of the right median (across palm) sensory nerve showed no response (Palm).  All remaining nerves (as indicated in the following tables) were within normal limits.    All examined muscles (as indicated in the following table) showed no evidence of electrical instability.    Impression: Essentially NORMAL electrodiagnostic study of the right upper limb.  There is no significant electrodiagnostic evidence of nerve entrapment, brachial plexopathy or cervical radiculopathy.   As you know, purely sensory or demyelinating radiculopathies and chemical radiculitis may not be detected with this particular electrodiagnostic study.  Pain and weakness likely from the proximal humerus metastasis.  Recommendations: 1.  Follow-up with referring physician. 2.  Continue current management of symptoms.  ___________________________ Laurence Spates FAAPMR Board Certified, American Board of Physical Medicine and Rehabilitation    Nerve Conduction Studies Anti Sensory Summary Table   Stim Site NR Peak (ms) Norm Peak (ms) P-T Amp (V) Norm P-T Amp Site1 Site2 Delta-P (ms) Dist (cm) Vel (m/s) Norm Vel (m/s)  Right Median Acr Palm Anti Sensory (2nd Digit)  30.9C  Wrist    3.5 <3.6 37.8 >10 Wrist Palm  0.0    Palm *NR  <2.0          Right Radial Anti Sensory (Base 1st Digit)  31.1C  Wrist    2.1 <3.1 42.4  Wrist Base 1st Digit 2.1 0.0    Right Ulnar Anti Sensory (5th Digit)  31.2C  Wrist    3.3 <3.7 18.4 >15.0 Wrist 5th Digit 3.3 14.0 42 >38   Motor Summary Table   Stim Site NR Onset (ms) Norm Onset (ms) O-P Amp (mV) Norm O-P Amp Site1 Site2 Delta-0 (ms) Dist (cm) Vel (m/s) Norm Vel (m/s)  Right Median Motor (Abd Poll Brev)  31.2C  Wrist    3.4 <4.2 7.2 >5 Elbow Wrist 3.9 22.5 58 >50  Elbow    7.3  4.8         Right Ulnar Motor (Abd Dig Min)  31.3C  Wrist    2.8 <4.2 3.7 >3 B Elbow Wrist 3.8 22.0 58 >53  B Elbow    6.6  5.9  A Elbow B Elbow 1.6 10.0 63 >53  A Elbow     8.2  6.0  Axilla A Elbow 5.6 0.0    Axilla    13.8  0.0          EMG   Side Muscle Nerve Root Ins Act Fibs Psw Amp Dur Poly Recrt Int Fraser Din Comment  Right 1stDorInt Ulnar C8-T1 Nml Nml Nml Nml Nml 0 Nml Nml   Right Abd Poll Brev Median C8-T1 Nml Nml Nml Nml Nml 0 Nml Nml   Right ExtDigCom   Nml Nml Nml Nml Nml 0 Nml Nml   Right Triceps Radial C6-7-8 Nml Nml Nml Nml Nml 0 Nml Nml   Right Deltoid Axillary C5-6 Nml Nml Nml Nml Nml 0 Nml Nml     Nerve Conduction Studies Anti Sensory Left/Right Comparison   Stim Site L Lat (ms) R Lat (ms) L-R Lat (ms) L Amp (V) R Amp (V) L-R Amp (%) Site1 Site2 L Vel (m/s) R Vel (m/s) L-R Vel (m/s)  Median Acr Palm Anti Sensory (2nd Digit)  30.9C  Wrist  3.5   37.8  Wrist Palm     Palm             Radial Anti Sensory (Base 1st Digit)  31.1C  Wrist  2.1   42.4  Wrist Base 1st Digit     Ulnar Anti Sensory (5th Digit)  31.2C  Wrist  3.3   18.4  Wrist 5th Digit  42    Motor Left/Right Comparison   Stim Site L Lat (ms) R Lat (ms) L-R Lat (ms) L Amp (mV) R Amp (mV) L-R Amp (%) Site1 Site2 L Vel (m/s) R Vel (m/s) L-R Vel (m/s)  Median Motor (Abd Poll Brev)  31.2C  Wrist  3.4   7.2  Elbow Wrist  58   Elbow  7.3   4.8        Ulnar Motor (Abd Dig Min)  31.3C  Wrist  2.8   3.7  B Elbow Wrist  58   B Elbow  6.6   5.9  A Elbow B Elbow  63   A Elbow  8.2   6.0  Axilla A Elbow     Axilla  13.8   0.0           Waveforms:

## 2020-06-24 NOTE — Progress Notes (Signed)
Joanna Ray - 70 y.o. female MRN [id number]  Date of birth: [birth year]  Office Visit Note: Visit Date: 06/21/2020 PCP: [name], MD Referred by: [name], MD  Subjective: Chief Complaint  Patient presents with  . Right Shoulder - Pain   HPI:  AUBRIELLE Ray is a 70 y.o. female who comes in today at the request of Dr. Eunice Ray for electrodiagnostic study of the Right upper extremities.  Patient is Right hand dominant.  She reports worsening right shoulder pain with weakness of the right arm with flexion abduction Duda pain.  Is been ongoing since before November of last year.  No relief with subacromial injection.  No history of paresthesia or numbness or tingling.  Since the order was placed for the electrodiagnostic study the patient has had MRI of the right shoulder showing bone metastasis and likely multiple myeloma.  ROS Otherwise per HPI.  Assessment & Plan: Visit Diagnoses:    ICD-10-CM   1. Weakness  R53.1 NCV with EMG (electromyography)    Plan: Impression: Essentially NORMAL electrodiagnostic study of the right upper limb.  There is no significant electrodiagnostic evidence of nerve entrapment, brachial plexopathy or cervical radiculopathy.   As you know, purely sensory or demyelinating radiculopathies and chemical radiculitis may not be detected with this particular electrodiagnostic study.  Pain and weakness likely from the proximal humerus metastasis.  Recommendations: 1.  Follow-up with referring physician. 2.  Continue current management of symptoms.   Meds & Orders: No orders of the defined types were placed in this encounter.   Orders Placed This Encounter  Procedures  . NCV with EMG (electromyography)    Follow-up: Return for Joanna Blase, MD.   Procedures: No procedures performed  EMG & NCV Findings: Evaluation of the right median (across palm) sensory nerve showed no response (Palm).  All remaining nerves (as indicated in  the following tables) were within normal limits.    All examined muscles (as indicated in the following table) showed no evidence of electrical instability.    Impression: Essentially NORMAL electrodiagnostic study of the right upper limb.  There is no significant electrodiagnostic evidence of nerve entrapment, brachial plexopathy or cervical radiculopathy.   As you know, purely sensory or demyelinating radiculopathies and chemical radiculitis may not be detected with this particular electrodiagnostic study.  Pain and weakness likely from the proximal humerus metastasis.  Recommendations: 1.  Follow-up with referring physician. 2.  Continue current management of symptoms.  ___________________________ Laurence Spates FAAPMR Board Certified, American Board of Physical Medicine and Rehabilitation    Nerve Conduction Studies Anti Sensory Summary Table   Stim Site NR Peak (ms) Norm Peak (ms) P-T Amp (V) Norm P-T Amp Site1 Site2 Delta-P (ms) Dist (cm) Vel (m/s) Norm Vel (m/s)  Right Median Acr Palm Anti Sensory (2nd Digit)  30.9C  Wrist    3.5 <3.6 37.8 >10 Wrist Palm  0.0    Palm *NR  <2.0          Right Radial Anti Sensory (Base 1st Digit)  31.1C  Wrist    2.1 <3.1 42.4  Wrist Base 1st Digit 2.1 0.0    Right Ulnar Anti Sensory (5th Digit)  31.2C  Wrist    3.3 <3.7 18.4 >15.0 Wrist 5th Digit 3.3 14.0 42 >38   Motor Summary Table   Stim Site NR Onset (ms) Norm Onset (ms) O-P Amp (mV) Norm O-P Amp Site1 Site2 Delta-0 (ms) Dist (cm) Vel (m/s) Norm Vel (m/s)  Right Median Motor (Abd Poll Brev)  31.2C  Wrist    3.4 <4.2 7.2 >5 Elbow Wrist 3.9 22.5 58 >50  Elbow    7.3  4.8         Right Ulnar Motor (Abd Dig Min)  31.3C  Wrist    2.8 <4.2 3.7 >3 B Elbow Wrist 3.8 22.0 58 >53  B Elbow    6.6  5.9  A Elbow B Elbow 1.6 10.0 63 >53  A Elbow    8.2  6.0  Axilla A Elbow 5.6 0.0    Axilla    13.8  0.0          EMG   Side Muscle Nerve Root Ins Act Fibs Psw Amp Dur Poly Recrt Int Fraser Din  Comment  Right 1stDorInt Ulnar C8-T1 _0  0 Nml Nml   Right Abd Poll Brev Median C8-T1 _1  0 Nml Nml   Right ExtDigCom   _2  0 Nml Nml   Right Triceps Radial C6-7-8 _3  0 Nml Nml   Right Deltoid Axillary C5-6 _4  0 Nml Nml     Nerve Conduction Studies Anti Sensory Left/Right Comparison   Stim Site L Lat (ms) R Lat (ms) L-R Lat (ms) L Amp (V) R Amp (V) L-R Amp (%) Site1 Site2 L Vel (m/s) R Vel (m/s) L-R Vel (m/s)  Median Acr Palm Anti Sensory (2nd Digit)  30.9C  Wrist  3.5   37.8  Wrist Palm     Palm             Radial Anti Sensory (Base 1st Digit)  31.1C  Wrist  2.1   42.4  Wrist Base 1st Digit     Ulnar Anti Sensory (5th Digit)  31.2C  Wrist  3.3   18.4  Wrist 5th Digit  42    Motor Left/Right Comparison   Stim Site L Lat (ms) R Lat (ms) L-R Lat (ms) L Amp (mV) R Amp (mV) L-R Amp (%) Site1 Site2 L Vel (m/s) R Vel (m/s) L-R Vel (m/s)  Median Motor (Abd Poll Brev)  31.2C  Wrist  3.4   7.2  Elbow Wrist  58   Elbow  7.3   4.8        Ulnar Motor (Abd Dig Min)  31.3C  Wrist  2.8   3.7  B Elbow Wrist  58   B Elbow  6.6   5.9  A Elbow B Elbow  63   A Elbow  8.2   6.0  Axilla A Elbow     Axilla  13.8   0.0           Waveforms:             Clinical History: No specialty comments available.     Objective:  VS:  HT:    WT:   BMI:     BP:   HR: bpm  TEMP: ( )  RESP:  Physical Exam Musculoskeletal:        General: No swelling, tenderness or deformity.     Comments: Inspection reveals no atrophy of the bilateral APB or FDI or hand intrinsics. There is no swelling, color changes, allodynia or dystrophic changes. There is 5 out of 5 strength in the bilateral wrist extension, finger abduction and long finger flexion. There is intact sensation to light touch in all dermatomal and peripheral nerve distributions.  There is a negative Phalen's test bilaterally. There is a negative Hoffmann's test  bilaterally.  Skin:    General: Skin is warm and dry.     Findings: No erythema or rash.  Neurological:     General: No focal deficit present.     Mental Status: She is alert and oriented to person, place, and time.     Motor: No weakness or abnormal muscle tone.     Coordination: Coordination normal.  Psychiatric:        Mood and Affect: Mood normal.        Behavior: Behavior normal.      Imaging: No results found.

## 2020-06-25 ENCOUNTER — Ambulatory Visit (HOSPITAL_COMMUNITY)
Admission: RE | Admit: 2020-06-25 | Discharge: 2020-06-25 | Disposition: A | Discharge status: Home / Self Care | Payer: Medicare HMO | Source: Ambulatory Visit | Attending: Internal Medicine | Admitting: Internal Medicine

## 2020-06-25 ENCOUNTER — Other Ambulatory Visit: Payer: Self-pay

## 2020-06-25 ENCOUNTER — Encounter (HOSPITAL_COMMUNITY): Payer: Self-pay

## 2020-06-25 DIAGNOSIS — D72822 Plasmacytosis: Secondary | ICD-10-CM | POA: Diagnosis not present

## 2020-06-25 DIAGNOSIS — E785 Hyperlipidemia, unspecified: Secondary | ICD-10-CM | POA: Diagnosis not present

## 2020-06-25 DIAGNOSIS — Z803 Family history of malignant neoplasm of breast: Secondary | ICD-10-CM | POA: Diagnosis not present

## 2020-06-25 DIAGNOSIS — Z8249 Family history of ischemic heart disease and other diseases of the circulatory system: Secondary | ICD-10-CM | POA: Diagnosis not present

## 2020-06-25 DIAGNOSIS — C7951 Secondary malignant neoplasm of bone: Secondary | ICD-10-CM | POA: Diagnosis present

## 2020-06-25 DIAGNOSIS — Z79899 Other long term (current) drug therapy: Secondary | ICD-10-CM | POA: Insufficient documentation

## 2020-06-25 DIAGNOSIS — I10 Essential (primary) hypertension: Secondary | ICD-10-CM | POA: Diagnosis not present

## 2020-06-25 DIAGNOSIS — M899 Disorder of bone, unspecified: Secondary | ICD-10-CM | POA: Diagnosis not present

## 2020-06-25 DIAGNOSIS — Z87891 Personal history of nicotine dependence: Secondary | ICD-10-CM | POA: Insufficient documentation

## 2020-06-25 DIAGNOSIS — Z7982 Long term (current) use of aspirin: Secondary | ICD-10-CM | POA: Insufficient documentation

## 2020-06-25 DIAGNOSIS — D7589 Other specified diseases of blood and blood-forming organs: Secondary | ICD-10-CM | POA: Insufficient documentation

## 2020-06-25 DIAGNOSIS — M898X8 Other specified disorders of bone, other site: Secondary | ICD-10-CM | POA: Diagnosis not present

## 2020-06-25 LAB — PROTIME-INR
INR: 1 (ref 0.8–1.2)
Prothrombin Time: 12.7 seconds (ref 11.4–15.2)

## 2020-06-25 LAB — CBC
HCT: 37 % (ref 36.0–46.0)
Hemoglobin: 12.1 g/dL (ref 12.0–15.0)
MCH: 30.9 pg (ref 26.0–34.0)
MCHC: 32.7 g/dL (ref 30.0–36.0)
MCV: 94.4 fL (ref 80.0–100.0)
Platelets: 217 10*3/uL (ref 150–400)
RBC: 3.92 MIL/uL (ref 3.87–5.11)
RDW: 12.8 % (ref 11.5–15.5)
WBC: 4.4 10*3/uL (ref 4.0–10.5)
nRBC: 0 % (ref 0.0–0.2)

## 2020-06-25 MED ORDER — MIDAZOLAM HCL 2 MG/2ML IJ SOLN
INTRAMUSCULAR | Status: AC
  Filled 2020-06-25: qty 4

## 2020-06-25 MED ORDER — FENTANYL CITRATE (PF) 100 MCG/2ML IJ SOLN
INTRAMUSCULAR | Status: AC
  Filled 2020-06-25: qty 2

## 2020-06-25 MED ORDER — FENTANYL CITRATE (PF) 100 MCG/2ML IJ SOLN
INTRAMUSCULAR | Status: AC | PRN
  Administered 2020-06-25 (×2): 50 ug via INTRAVENOUS

## 2020-06-25 MED ORDER — LIDOCAINE HCL (PF) 1 % IJ SOLN
INTRAMUSCULAR | Status: AC | PRN
  Administered 2020-06-25: 10 mL

## 2020-06-25 MED ORDER — SODIUM CHLORIDE 0.9 % IV SOLN: INTRAVENOUS | Status: DC

## 2020-06-25 MED ORDER — MIDAZOLAM HCL 2 MG/2ML IJ SOLN
INTRAMUSCULAR | Status: AC | PRN
  Administered 2020-06-25 (×2): 1 mg via INTRAVENOUS

## 2020-06-25 NOTE — Procedures (Signed)
Interventional Radiology Procedure:   Indications: Myeloma workup.  Bone lesion in right humerus.  Procedure: CT guided bone marrow biopsy  Findings: 2 aspirates and 1 core from right ilium  Complications: None     EBL: Minimal, less than 10 ml  Plan: Discharge to home in one hour.   Danitza Schoenfeldt R. Anselm Pancoast, MD  Pager: [phone]

## 2020-06-25 NOTE — Discharge Instructions (Signed)
Urgent needs - Interventional Radiology on call MD [phone]  Wound - May remove dressing and shower in 24 to 48 hours.  Keep site clean and dry.  Replace with bandaid as needed.  Do not submerge in tub or water until site healing well. If closed with glue, glue will flake off on its own.   Bone Marrow Aspiration and Bone Marrow Biopsy, Adult, Care After This sheet gives you information about how to care for yourself after your procedure. Your health care provider may also give you more specific instructions. If you have problems or questions, contact your health care provider. What can I expect after the procedure? After the procedure, it is common to have:  Mild pain and tenderness.  Swelling.  Bruising. Follow these instructions at home: Puncture site care  Follow instructions from your health care provider about how to take care of the puncture site. Make sure you: ? Wash your hands with soap and water before and after you change your bandage (dressing). If soap and water are not available, use hand sanitizer. ? Change your dressing as told by your health care provider.  Check your puncture site every day for signs of infection. Check for: ? More redness, swelling, or pain. ? Fluid or blood. ? Warmth. ? Pus or a bad smell.   Activity  Return to your normal activities as told by your health care provider. Ask your health care provider what activities are safe for you.  Do not lift anything that is heavier than 10 lb (4.5 kg), or the limit that you are told, until your health care provider says that it is safe.  Do not drive for 24 hours if you were given a sedative during your procedure. General instructions  Take over-the-counter and prescription medicines only as told by your health care provider.  Do not take baths, swim, or use a hot tub until your health care provider approves. Ask your health care provider if you may take showers. You may only be allowed to take  sponge baths.  If directed, put ice on the affected area. To do this: ? Put ice in a plastic bag. ? Place a towel between your skin and the bag. ? Leave the ice on for 20 minutes, 2-3 times a day.  Keep all follow-up visits as told by your health care provider. This is important.   Contact a health care provider if:  Your pain is not controlled with medicine.  You have a fever.  You have more redness, swelling, or pain around the puncture site.  You have fluid or blood coming from the puncture site.  Your puncture site feels warm to the touch.  You have pus or a bad smell coming from the puncture site. Summary  After the procedure, it is common to have mild pain, tenderness, swelling, and bruising.  Follow instructions from your health care provider about how to take care of the puncture site and what activities are safe for you.  Take over-the-counter and prescription medicines only as told by your health care provider.  Contact a health care provider if you have any signs of infection, such as fluid or blood coming from the puncture site. This information is not intended to replace advice given to you by your health care provider. Make sure you discuss any questions you have with your health care provider. Document Revised: 08/30/2018 Document Reviewed: 08/30/2018 Elsevier Patient Education  2021 [employer].   Moderate Conscious Sedation, Adult, Care After  This sheet gives you information about how to care for yourself after your procedure. Your health care provider may also give you more specific instructions. If you have problems or questions, contact your health care provider. What can I expect after the procedure? After the procedure, it is common to have:  Sleepiness for several hours.  Impaired judgment for several hours.  Difficulty with balance.  Vomiting if you eat too soon. Follow these instructions at home: For the time period you were told by your  health care provider:  Rest.  Do not participate in activities where you could fall or become injured.  Do not drive or use machinery.  Do not drink alcohol.  Do not take sleeping pills or medicines that cause drowsiness.  Do not make important decisions or sign legal documents.  Do not take care of children on your own.      Eating and drinking  Follow the diet recommended by your health care provider.  Drink enough fluid to keep your urine pale yellow.  If you vomit: ? Drink water, juice, or soup when you can drink without vomiting. ? Make sure you have little or no nausea before eating solid foods.   General instructions  Take over-the-counter and prescription medicines only as told by your health care provider.  Have a responsible adult stay with you for the time you are told. It is important to have someone help care for you until you are awake and alert.  Do not smoke.  Keep all follow-up visits as told by your health care provider. This is important. Contact a health care provider if:  You are still sleepy or having trouble with balance after 24 hours.  You feel light-headed.  You keep feeling nauseous or you keep vomiting.  You develop a rash.  You have a fever.  You have redness or swelling around the IV site. Get help right away if:  You have trouble breathing.  You have new-onset confusion at home. Summary  After the procedure, it is common to feel sleepy, have impaired judgment, or feel nauseous if you eat too soon.  Rest after you get home. Know the things you should not do after the procedure.  Follow the diet recommended by your health care provider and drink enough fluid to keep your urine pale yellow.  Get help right away if you have trouble breathing or new-onset confusion at home. This information is not intended to replace advice given to you by your health care provider. Make sure you discuss any questions you have with your health  care provider. Document Revised: 08/11/2019 Document Reviewed: 03/09/2019 Elsevier Patient Education  2021 [employer].

## 2020-06-25 NOTE — H&P (Signed)
Chief Complaint: Patient was seen in consultation today for image guided bone marrow biopsy at the request of [facility]  Referring Physician(s): Mohamed,Mohamed  Supervising Physician: Markus Daft  Patient Status: [facility] - Out-pt  History of Present Illness: Joanna Ray is a 70 y.o. female new to our service.  Her past medical history significant for anxiety, GERD, dyslipidemia, hypertension as well as urticaria.  Patient started having right shoulder pain in October 2021, and she saw a orthopedic surgeon who ordered MRI of right shoulder.  The MRI unfortunately, showed a mass in the proximal right humerus highly suspicious for metastatic the or artery carcinoma. Patient was referred to oncology for work-up and recommendation regarding her condition and treatment options.  IR was requested for a image guided bone marrow biopsy.  Patient laying in bed, not in acute distress.  Denise headache, fever, chills, shortness of breath, cough, chest pain, abdominal pain, nausea ,vomiting, and bleeding.   Past Medical History:  Diagnosis Date  . Anxiety   . GERD (gastroesophageal reflux disease)   . Hyperlipidemia   . Hypertension   . Urticaria     Past Surgical History:  Procedure Laterality Date  . CATARACT EXTRACTION, BILATERAL Bilateral   . DILATION AND CURETTAGE OF UTERUS      Allergies: Shellfish allergy  Medications: Prior to Admission medications   Medication Sig Start Date End Date Taking? Authorizing Provider  cholecalciferol (VITAMIN D) 1000 UNITS tablet Take 1,000 Units by mouth daily.   Yes [provider]  fexofenadine (ALLEGRA) 180 MG tablet Take 1 tablet (180 mg total) by mouth daily. 06/29/19  Yes Padgett, Rae Halsted, MD  rosuvastatin (CRESTOR) 20 MG tablet Take 1/2 (one-half) tablet by mouth once daily 01/27/20  Yes Baxley, Cresenciano Lick, MD  valsartan-hydrochlorothiazide (DIOVAN-HCT) 160-25 MG tablet Take 1 tablet by mouth once daily 06/18/20   Yes Baxley, Cresenciano Lick, MD  ALPRAZolam Duanne Moron) 0.5 MG tablet Take 1 tablet by mouth twice daily as needed for anxiety 06/16/20   [name], MD  aspirin 81 MG tablet Take 81 mg by mouth daily.    [provider]  EPINEPHrine 0.3 mg/0.3 mL IJ SOAJ injection Inject 0.3 mLs (0.3 mg total) into the muscle as needed for anaphylaxis. 12/09/18   [name], MD  traMADol (ULTRAM) 50 MG tablet Take 1 tablet (50 mg total) by mouth every 6 (six) hours as needed. 05/17/20   Hilts, Michael, MD  vitamin E 100 UNIT capsule Take 100 Units by mouth daily.    [provider]     Family History  Problem Relation Age of Onset  . Stroke Mother   . Stroke Father   . Diabetes Sister   . Breast cancer Sister   . Colon cancer Neg Hx   . Colon polyps Neg Hx   . Esophageal cancer Neg Hx   . Rectal cancer Neg Hx   . Stomach cancer Neg Hx     Social History   Socioeconomic History  . Marital status: Married    Spouse name: Not on file  . Number of children: 4  . Years of education: Not on file  . Highest education level: Not on file  Occupational History  . Occupation: Theatre manager  Tobacco Use  . Smoking status: Former Smoker    Packs/day: 0.25    Years: 20.00    Pack years: 5.00    Types: Cigarettes    Quit date: 1980    Years since quitting: 42.1  .  Smokeless tobacco: Never Used  Vaping Use  . Vaping Use: Never used  Substance and Sexual Activity  . Alcohol use: No  . Drug use: No  . Sexual activity: Not on file  Other Topics Concern  . Not on file  Social History Narrative  . Not on file   Social Determinants of Health   Financial Resource Strain: Not on file  Food Insecurity: Not on file  Transportation Needs: Not on file  Physical Activity: Not on file  Stress: Not on file  Social Connections: Not on file     Review of Systems: A 12 point ROS discussed and pertinent positives are indicated in the HPI above.  All other systems are negative.   Vital  Signs: BP (!) 160/94   Pulse (!) 55   Temp 98.5 F (36.9 C) (Oral)   Resp 16   SpO2 97%   Physical Examination   Constitutional:      Appearance: Normal appearance.  Cardiovascular:     Rate and Rhythm: Normal rate and regular rhythm.     Pulses: Normal pulses.     Heart sounds: Normal heart sounds.  Pulmonary:     Effort: Pulmonary effort is normal.     Breath sounds: Normal breath sounds.  Abdominal:     General: Abdomen is flat. Bowel sounds are normal.     Palpations: Abdomen is soft.  Neurological:     Mental Status: Patient is alert and oriented to person, place, and time.    MD Evaluation Airway: WNL Heart: WNL Abdomen: WNL Chest/ Lungs: WNL ASA  Classification: 2 Mallampati/Airway Score: One  Imaging: MR SHOULDER RIGHT WO CONTRAST  Result Date: 06/05/2020 CLINICAL DATA:  Onset right shoulder pain, weakness and limited range of motion in October, 2021. No known injury. EXAM: MRI OF THE RIGHT SHOULDER WITHOUT CONTRAST TECHNIQUE: Multiplanar, multisequence MR imaging of the shoulder was performed. No intravenous contrast was administered. COMPARISON:  None. FINDINGS: Rotator cuff:  Intact with moderate tendinopathy noted. Muscles: No atrophy. A cyst measuring 1.3 cm transverse by 0.6 cm craniocaudal by 0.9 cm AP inferior to the supraspinatus is most consistent with a ganglion. Mild edema is seen subjacent to all of the rotator cuff muscles. Biceps long head: Intact. There is tendinopathy of the intra-articular segment. Acromioclavicular Joint: Mild-to-moderate osteoarthritis. Type 2 acromion. Meso-acromion type os acromiale with degenerative change about the synchondrosis noted. Glenohumeral Joint: Mild degenerative disease. Labrum:  Appears intact. Bones: There is an expansile lesion in the proximal diaphysis and metaphysis of the humerus which measures 5.3 cm craniocaudal by approximately 3.5 cm AP x 3 cm transverse. The lesion extends through the cortex medially and  posteriorly. There is some edema surrounding the lesion. No other focal bony lesion is identified. Other: None. IMPRESSION: Mass in the proximal right humerus has an appearance highly suspicious for metastatic disease or other carcinoma such as plasmacytoma. Rotator cuff and intra-articular long head of biceps tendinopathy without tear. Mild to moderate acromioclavicular osteoarthritis. Os acromiale also noted. Small ganglion cyst subjacent to the supraspinatus muscle belly. Mild edema in the rotator cuff musculature is that mild edema along the inferior aspect of the rotator cuff musculature may be due to strain. These results will be called to the ordering clinician or representative by the Radiologist Assistant, and communication documented in the PACS or [employer]. Electronically Signed   By: Inge Rise M.D.   On: 06/05/2020 10:54   NCV with EMG (electromyography)  Result Date: 06/21/2020 Magnus Sinning,  MD     06/24/2020 11:38 AM EMG & NCV Findings: Evaluation of the right median (across palm) sensory nerve showed no response (Palm).  All remaining nerves (as indicated in the following tables) were within normal limits.  All examined muscles (as indicated in the following table) showed no evidence of electrical instability.  Impression: Essentially NORMAL electrodiagnostic study of the right upper limb.  There is no significant electrodiagnostic evidence of nerve entrapment, brachial plexopathy or cervical radiculopathy. As you know, purely sensory or demyelinating radiculopathies and chemical radiculitis may not be detected with this particular electrodiagnostic study. Pain and weakness likely from the proximal humerus metastasis. Recommendations: 1.  Follow-up with referring physician. 2.  Continue current management of symptoms. ___________________________ Laurence Spates FAAPMR Board Certified, American Board of Physical Medicine and Rehabilitation Nerve Conduction Studies Anti Sensory Summary  Table  Stim Site NR Peak (ms) Norm Peak (ms) P-T Amp (V) Norm P-T Amp Site1 Site2 Delta-P (ms) Dist (cm) Vel (m/s) Norm Vel (m/s) Right Median Acr Palm Anti Sensory (2nd Digit)  30.9C Wrist    3.5 <3.6 37.8 >10 Wrist Palm  0.0   Palm *NR  <2.0         Right Radial Anti Sensory (Base 1st Digit)  31.1C Wrist    2.1 <3.1 42.4  Wrist Base 1st Digit 2.1 0.0   Right Ulnar Anti Sensory (5th Digit)  31.2C Wrist    3.3 <3.7 18.4 >15.0 Wrist 5th Digit 3.3 14.0 42 >38 Motor Summary Table  Stim Site NR Onset (ms) Norm Onset (ms) O-P Amp (mV) Norm O-P Amp Site1 Site2 Delta-0 (ms) Dist (cm) Vel (m/s) Norm Vel (m/s) Right Median Motor (Abd Poll Brev)  31.2C Wrist    3.4 <4.2 7.2 >5 Elbow Wrist 3.9 22.5 58 >50 Elbow    7.3  4.8        Right Ulnar Motor (Abd Dig Min)  31.3C Wrist    2.8 <4.2 3.7 >3 B Elbow Wrist 3.8 22.0 58 >53 B Elbow    6.6  5.9  A Elbow B Elbow 1.6 10.0 63 >53 A Elbow    8.2  6.0  Axilla A Elbow 5.6 0.0   Axilla    13.8  0.0        EMG  Side Muscle Nerve Root Ins Act Fibs Psw Amp Dur Poly Recrt Int Fraser Din Comment Right 1stDorInt Ulnar C8-T1 Nml Nml Nml Nml Nml 0 Nml Nml  Right Abd Poll Brev Median C8-T1 Nml Nml Nml Nml Nml 0 Nml Nml  Right ExtDigCom   Nml Nml Nml Nml Nml 0 Nml Nml  Right Triceps Radial C6-7-8 Nml Nml Nml Nml Nml 0 Nml Nml  Right Deltoid Axillary C5-6 Nml Nml Nml Nml Nml 0 Nml Nml  Nerve Conduction Studies Anti Sensory Left/Right Comparison  Stim Site L Lat (ms) R Lat (ms) L-R Lat (ms) L Amp (V) R Amp (V) L-R Amp (%) Site1 Site2 L Vel (m/s) R Vel (m/s) L-R Vel (m/s) Median Acr Palm Anti Sensory (2nd Digit)  30.9C Wrist  3.5   37.8  Wrist Palm    Palm            Radial Anti Sensory (Base 1st Digit)  31.1C Wrist  2.1   42.4  Wrist Base 1st Digit    Ulnar Anti Sensory (5th Digit)  31.2C Wrist  3.3   18.4  Wrist 5th Digit  42  Motor Left/Right Comparison  Stim Site L Lat (ms) R Lat (ms)  L-R Lat (ms) L Amp (mV) R Amp (mV) L-R Amp (%) Site1 Site2 L Vel (m/s) R Vel (m/s) L-R Vel (m/s) Median  Motor (Abd Poll Brev)  31.2C Wrist  3.4   7.2  Elbow Wrist  58  Elbow  7.3   4.8       Ulnar Motor (Abd Dig Min)  31.3C Wrist  2.8   3.7  B Elbow Wrist  58  B Elbow  6.6   5.9  A Elbow B Elbow  63  A Elbow  8.2   6.0  Axilla A Elbow    Axilla  13.8   0.0       Waveforms:         Labs:  CBC: Recent Labs    07/09/19 0131 08/29/19 1122 06/06/20 1400  WBC 6.6 4.5 5.7  HGB 12.5 12.3 12.7  HCT 39.0 37.0 38.4  PLT 198 216 280    COAGS: No results for input(s): INR, APTT in the last 8760 hours.  BMP: Recent Labs    07/09/19 0131 08/29/19 1122 09/19/19 1110 03/04/20 1107 03/18/20 0959 04/23/20 1627 05/03/20 0909 06/06/20 1400  NA 139 142   < > 140 141 141 140 140  K 4.1 4.4   < > 4.3 3.9 5.4* 4.6 3.0*  CL 103 105   < > 101 101 106 101 101  CO2 28 30   < > 30 31 28  32 29  GLUCOSE 125* 93   < > 84 95 76 90 100*  BUN 19 20   < > 29* 16 22 18 17   CALCIUM 9.6 9.5   < > 9.5 9.7 10.1 9.9 9.9  CREATININE 1.05* 1.03*   < > 1.29* 1.04* 1.23* 1.20* 1.11*  GFRNONAA 55* 56*  --  42*  --   --   --  54*  GFRAA >60 65  --  49*  --   --   --   --    < > = values in this interval not displayed.    LIVER FUNCTION TESTS: Recent Labs    08/29/19 1122 03/04/20 1107 06/06/20 1400  BILITOT 0.9 0.7  0.7 0.7  AST 15 18  18 15   ALT 11 14  14 10   ALKPHOS  --   --  73  PROT 7.3 7.9  7.9 9.5*  ALBUMIN  --   --  4.4    TUMOR MARKERS: No results for input(s): AFPTM, CEA, CA199, CHROMGRNA in the last 8760 hours.  Assessment and Plan: 70 y.o. female with recent MRI finding of a right humerus mass highly suspicious for metastatic disease or other carcinoma.  Her oncologist, Dr. Earlie Server, referred her to IR for bone marrow biopsy for further work-up of the right humerus mass. Patient presents to IR for image guided bone marrow biopsy today. N.p.o. since midnight Labs pending, not on blood thinner  Risks and benefits of image guided bone marrow biopsy was discussed with the patient and/or  patient's family including, but not limited to bleeding, infection, damage to adjacent structures or low yield requiring additional tests.  All of the questions were answered and there is agreement to proceed.  Consent signed and in chart.   Thank you for this interesting consult.  I greatly enjoyed meeting CHAYLA SHANDS and look forward to participating in their care.  A copy of this report was sent to the requesting provider on this date.  Electronically Signed: Tera Mater, PA-C 06/25/2020, 8:07 AM  I spent a total of  15 Minutes  in face to face in clinical consultation, greater than 50% of which was counseling/coordinating care for image guided bone marrow biopsy.

## 2020-06-26 ENCOUNTER — Telehealth: Payer: Self-pay | Admitting: Medical Oncology

## 2020-06-26 ENCOUNTER — Inpatient Hospital Stay: Payer: Medicare HMO | Admitting: Internal Medicine

## 2020-06-26 NOTE — Telephone Encounter (Signed)
Pt contacted and appt moved to Friday due to biopsy result may not be ready.

## 2020-06-27 ENCOUNTER — Inpatient Hospital Stay: Payer: Medicare HMO | Admitting: Internal Medicine

## 2020-06-27 LAB — SURGICAL PATHOLOGY

## 2020-06-28 ENCOUNTER — Other Ambulatory Visit: Payer: Self-pay

## 2020-06-28 ENCOUNTER — Telehealth: Payer: Self-pay

## 2020-06-28 ENCOUNTER — Inpatient Hospital Stay: Payer: Medicare HMO | Attending: Internal Medicine | Admitting: Internal Medicine

## 2020-06-28 ENCOUNTER — Encounter: Payer: Self-pay | Admitting: Internal Medicine

## 2020-06-28 VITALS — BP 167/86 | HR 67 | Temp 97.5°F | Resp 18 | Ht 65.0 in | Wt 198.7 lb

## 2020-06-28 DIAGNOSIS — E785 Hyperlipidemia, unspecified: Secondary | ICD-10-CM | POA: Diagnosis not present

## 2020-06-28 DIAGNOSIS — I10 Essential (primary) hypertension: Secondary | ICD-10-CM | POA: Insufficient documentation

## 2020-06-28 DIAGNOSIS — Z7982 Long term (current) use of aspirin: Secondary | ICD-10-CM | POA: Diagnosis not present

## 2020-06-28 DIAGNOSIS — C9000 Multiple myeloma not having achieved remission: Secondary | ICD-10-CM | POA: Diagnosis not present

## 2020-06-28 DIAGNOSIS — C7951 Secondary malignant neoplasm of bone: Secondary | ICD-10-CM | POA: Diagnosis not present

## 2020-06-28 DIAGNOSIS — R531 Weakness: Secondary | ICD-10-CM | POA: Insufficient documentation

## 2020-06-28 DIAGNOSIS — C9031 Solitary plasmacytoma in remission: Secondary | ICD-10-CM | POA: Diagnosis not present

## 2020-06-28 DIAGNOSIS — Z79899 Other long term (current) drug therapy: Secondary | ICD-10-CM | POA: Insufficient documentation

## 2020-06-28 DIAGNOSIS — C9030 Solitary plasmacytoma not having achieved remission: Secondary | ICD-10-CM | POA: Insufficient documentation

## 2020-06-28 DIAGNOSIS — M25511 Pain in right shoulder: Secondary | ICD-10-CM | POA: Diagnosis not present

## 2020-06-28 DIAGNOSIS — F419 Anxiety disorder, unspecified: Secondary | ICD-10-CM | POA: Diagnosis not present

## 2020-06-28 DIAGNOSIS — K219 Gastro-esophageal reflux disease without esophagitis: Secondary | ICD-10-CM | POA: Insufficient documentation

## 2020-06-28 NOTE — Patient Instructions (Signed)
Multiple Myeloma  Multiple myeloma is a form of cancer. It develops when abnormal plasma cells grow out of control. Plasma cells are a type of white blood cell that is made in the soft tissue inside the bones (bone marrow). They are part of the body's disease-fighting system (immune system). Multiple myeloma damages bones and causes other health problems because of its effect on blood cells. Abnormal plasma cells produce monoclonal proteins (M proteins) and interfere with many important functions that normal cells perform in the body. The disease gets worse over time (progresses) and reduces the body's ability to fight infections. What are the causes? The cause of multiple myeloma is not known. What increases the risk? You are more likely to develop this condition if you:  Are older than age 65.  Are female.  Are African American.  Have a family history of multiple myeloma.  Have a history of monoclonal gammopathy of undetermined significance (MGUS).  Have a history of radiation exposure.  Have been exposed to certain chemicals, such as benzene or pesticides. What are the signs or symptoms? Signs and symptoms of multiple myeloma may include:  Bone pain, especially in the back, ribs, and hips.  Broken bones (fractures).  Having a low level of red blood cells (anemia), white blood cells (leukopenia), and platelets (thrombocytopenia). Platelets are cells that help blood to clot so a wound does not keep bleeding.  Fatigue.  Weakness.  Infections.  Unusual bleeding, such as: ? Bleeding from the nose or gums. ? Bleeding a lot from a small scrape or cut.  High blood calcium levels.  Increased urination.  Confusion.  Shortness of breath.  Weakness or numbness in your legs.  Sudden, severe back pain. How is this diagnosed? This condition is diagnosed based on your symptoms, your medical history, and a physical exam. You will have blood and urine tests to confirm that M  proteins are present. You may also have other tests, including:  Additional blood tests.  X-rays.  MRI.  CT scan.  PET scan.  Tests to check the function of your kidneys.  Heart tests, such as an echocardiogram. An echocardiogram uses sound waves to produce an image of the heart.  A procedure to remove a sample of bone marrow (bone marrow biopsy). The sample is examined for abnormal plasma cells. How is this treated? There is no cure for multiple myeloma. However, treatments can manage symptoms and slow the progression of the disease. Treatment options may vary depending on how much the disease has advanced. Possible treatment options may include:  Medicines that kill cancer cells (chemotherapy).  Radiation therapy. This is the use of high-energy rays to kill cancer cells.  A bone marrow transplant. This procedure replaces diseased bone marrow with healthy bone marrow (stem cell transplant).  Medicines that block the growth and spread of cancer cells (targeted drug therapy).  Medicines that strengthen your immune system's ability to fight cancer cells (immunotherapy or biologic therapy).  Participating in clinical trials to find out if new (experimental) treatments are effective.  Medicines that help to prevent bone damage (bisphosphonates).  Medicines that reduce swelling (corticosteroids).  Surgery to repair bone damage.  A procedure to remove plasma cells from your blood (plasmapheresis).  Other medicines to treat problems such as infections or pain. Follow these instructions at home: Eating and drinking  Drink enough fluid to keep your urine pale yellow.  Try to eat healthy meals on a regular basis. Some of your treatments might affect your appetite.   If you are having problems eating or if you do not have an appetite, meet with a diet and nutrition specialist (dietitian).  Take vitamins or supplements only as told by your health care provider or dietitian. Some  vitamins and supplements may interfere with how well your treatment works.   General instructions  Take over-the-counter and prescription medicines only as told by your health care provider.  Stay active. Talk with your health care provider about what types of exercises and activities are safe for you. ? Avoid activities that cause increased pain. ? Do not lift anything that is heavier than 10 lb (4.5 kg), or the limit that you are told, until your health care provider says that it is safe.  Consider joining a support group or getting counseling to help you cope with the stress of having multiple myeloma.  Keep all follow-up visits as told by your health care provider. This is important. Where to find more information  American Cancer Society: www.cancer.org  Leukemia and Lymphoma Society: www.LLS.[place] www.cancer.gov Contact a health care provider if you:  Have pain that gets worse or does not get better with medicine.  Have a fever.  Have swollen legs.  Have weakness or dizziness.  Have unexplained weight loss.  Have unexplained bleeding or bruising.  Have a cough or symptoms of the common cold.  Feel depressed.  Have changes in urination or bowel movements. Get help right away if you:  Have sudden severe pain, especially back pain.  Have numbness or weakness in your arms, hands, legs, or feet.  Become very confused.  Have weakness on one side of your body.  Have slurred speech.  Have trouble staying awake.  Have shortness of breath.  Have blood in your stool (feces) or urine.  Vomit blood or cough up blood. Summary  Multiple myeloma is a form of cancer. It develops when abnormal plasma cells grow out of control.  There is no cure for multiple myeloma. However, treatments can manage symptoms and slow the progression of the disease. Treatment options may vary depending on how much the disease has advanced.  Do not lift  anything that is heavier than 10 lb (4.5 kg), or the limit that you are told, until your health care provider says that it is safe.  Contact your health care provider if you have any new symptoms or sudden severe pain, especially back pain. This information is not intended to replace advice given to you by your health care provider. Make sure you discuss any questions you have with your health care provider. Document Revised: 09/04/2019 Document Reviewed: 09/04/2019 Elsevier Patient Education  [place].

## 2020-06-28 NOTE — Telephone Encounter (Signed)
I called Radiology Scheduling wanting to know why pt did not have her bone scan I was advised the pt was scheduled for 2/18 with a 8:45am arrival time. They spoke directly with the pt, so she was aware.  I then spoke with the pt who acknowledges she was aware and presented to the wrong office. I have provided the pt with the Radiology Scheduling phone number to reschedule her exam. She expressed understanding of this information and the need to the exam.

## 2020-06-28 NOTE — Progress Notes (Signed)
[facility] [phone]   [phone]  OFFICE PROGRESS NOTE  [name], MD [address] 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  PRIOR THERAPY: None  CURRENT THERAPY: The patient will be referred to radiation oncology for palliative radiotherapy to the plasmacytoma of the proximal right humerus.  INTERVAL HISTORY: Joanna Ray 70 y.o. female returns to the clinic today for follow-up visit accompanied by her husband.  The patient is feeling fine today with no concerning complaints except for the pain in the right shoulder area.  She denied having any chest pain, shortness of breath, cough or hemoptysis.  She denied having any fever or chills.  She has no nausea, vomiting, diarrhea or constipation.  She denied having any headache or visual changes.  She had a bone marrow biopsy and aspirate performed recently that showed 6% plasma cells.  The patient also has full myeloma panel and she is here today for evaluation and discussion of her lab results and treatment options.  She was supposed to have a skeletal bone survey but for some reason it was not scheduled as ordered.   MEDICAL HISTORY: Past Medical History:  Diagnosis Date  . Anxiety   . GERD (gastroesophageal reflux disease)   . Hyperlipidemia   . Hypertension   . Urticaria     ALLERGIES:  is allergic to shellfish allergy.  MEDICATIONS:  Current Outpatient Medications  Medication Sig Dispense Refill  . ALPRAZolam (XANAX) 0.5 MG tablet Take 1 tablet by mouth twice daily as needed for anxiety 60 tablet 5  . aspirin 81 MG tablet Take 81 mg by mouth daily.    . cholecalciferol (VITAMIN D) 1000 UNITS tablet Take 1,000 Units by mouth daily.    Marland Kitchen EPINEPHrine 0.3 mg/0.3 mL IJ SOAJ injection Inject 0.3 mLs (0.3 mg total) into the muscle as needed for anaphylaxis. 1 each 5   . escitalopram (LEXAPRO) 10 MG tablet 1 tab(s)    . fexofenadine (ALLEGRA) 180 MG tablet Take 1 tablet (180 mg total) by mouth daily. 90 tablet 1  . hydrocortisone valerate cream (WESTCORT) 0.2 % 1 app    . rosuvastatin (CRESTOR) 20 MG tablet Take 1/2 (one-half) tablet by mouth once daily 45 tablet 1  . traMADol (ULTRAM) 50 MG tablet Take 1 tablet (50 mg total) by mouth every 6 (six) hours as needed. 30 tablet 0  . valsartan-hydrochlorothiazide (DIOVAN-HCT) 160-25 MG tablet Take 1 tablet by mouth once daily 90 tablet 0  . vitamin E 100 UNIT capsule Take 100 Units by mouth daily.     No current facility-administered medications for this visit.    SURGICAL HISTORY:  Past Surgical History:  Procedure Laterality Date  . CATARACT EXTRACTION, BILATERAL Bilateral   . DILATION AND CURETTAGE OF UTERUS      REVIEW OF SYSTEMS:  Constitutional: positive for fatigue Eyes: negative Ears, nose, mouth, throat, and face: negative Respiratory: negative Cardiovascular: negative Gastrointestinal: negative Genitourinary:negative Integument/breast: negative Hematologic/lymphatic: negative Musculoskeletal:positive for bone pain Neurological: negative Behavioral/Psych: negative Endocrine: negative Allergic/Immunologic: negative   PHYSICAL EXAMINATION: General appearance: alert, cooperative, fatigued and no distress Head: Normocephalic, without obvious abnormality, atraumatic Neck: no adenopathy, no JVD, supple, symmetrical, trachea midline and thyroid not enlarged, symmetric, no tenderness/mass/nodules Lymph nodes: Cervical, supraclavicular, and axillary nodes normal. Resp: clear to auscultation bilaterally Back: symmetric, no curvature. ROM normal. No CVA tenderness. Cardio: regular rate and  rhythm, S1, S2 normal, no murmur, click, rub or gallop GI: soft, non-tender; bowel sounds normal; no masses,  no organomegaly Extremities: extremities normal, atraumatic, no cyanosis or edema Neurologic:  Alert and oriented X 3, normal strength and tone. Normal symmetric reflexes. Normal coordination and gait  ECOG PERFORMANCE STATUS: 1 - Symptomatic but completely ambulatory  Blood pressure (!) 167/86, pulse 67, temperature (!) 97.5 F (36.4 C), temperature source Tympanic, resp. rate 18, height 5' 5" (1.651 m), weight 198 lb 11.2 oz (90.1 kg), SpO2 100 %.  LABORATORY DATA: Lab Results  Component Value Date   WBC 4.4 06/25/2020   HGB 12.1 06/25/2020   HCT 37.0 06/25/2020   MCV 94.4 06/25/2020   PLT 217 06/25/2020      Chemistry      Component Value Date/Time   NA 140 06/06/2020 1400   NA 145 (H) 12/28/2018 1436   K 3.0 (L) 06/06/2020 1400   CL 101 06/06/2020 1400   CO2 29 06/06/2020 1400   BUN 17 06/06/2020 1400   BUN 15 12/28/2018 1436   CREATININE 1.11 (H) 06/06/2020 1400   CREATININE 1.20 (H) 05/03/2020 0909      Component Value Date/Time   CALCIUM 9.9 06/06/2020 1400   ALKPHOS 73 06/06/2020 1400   AST 15 06/06/2020 1400   ALT 10 06/06/2020 1400   BILITOT 0.7 06/06/2020 1400       RADIOGRAPHIC STUDIES: MR SHOULDER RIGHT WO CONTRAST  Result Date: 06/05/2020 CLINICAL DATA:  Onset right shoulder pain, weakness and limited range of motion in October, 2021. No known injury. EXAM: MRI OF THE RIGHT SHOULDER WITHOUT CONTRAST TECHNIQUE: Multiplanar, multisequence MR imaging of the shoulder was performed. No intravenous contrast was administered. COMPARISON:  None. FINDINGS: Rotator cuff:  Intact with moderate tendinopathy noted. Muscles: No atrophy. A cyst measuring 1.3 cm transverse by 0.6 cm craniocaudal by 0.9 cm AP inferior to the supraspinatus is most consistent with a ganglion. Mild edema is seen subjacent to all of the rotator cuff muscles. Biceps long head: Intact. There is tendinopathy of the intra-articular segment. Acromioclavicular Joint: Mild-to-moderate osteoarthritis. Type 2 acromion. Meso-acromion type os acromiale with degenerative change about the synchondrosis  noted. Glenohumeral Joint: Mild degenerative disease. Labrum:  Appears intact. Bones: There is an expansile lesion in the proximal diaphysis and metaphysis of the humerus which measures 5.3 cm craniocaudal by approximately 3.5 cm AP x 3 cm transverse. The lesion extends through the cortex medially and posteriorly. There is some edema surrounding the lesion. No other focal bony lesion is identified. Other: None. IMPRESSION: Mass in the proximal right humerus has an appearance highly suspicious for metastatic disease or other carcinoma such as plasmacytoma. Rotator cuff and intra-articular long head of biceps tendinopathy without tear. Mild to moderate acromioclavicular osteoarthritis. Os acromiale also noted. Small ganglion cyst subjacent to the supraspinatus muscle belly. Mild edema in the rotator cuff musculature is that mild edema along the inferior aspect of the rotator cuff musculature may be due to strain. These results will be called to the ordering clinician or representative by the Radiologist Assistant, and communication documented in the PACS or [facility] Dashboard. Electronically Signed   By: Thomas  Dalessio M.D.   On: 06/05/2020 10:54   CT Biopsy  Result Date: 06/25/2020 INDICATION: 69-year-old with a bone lesion in the proximal right humerus. Bone marrow biopsy needed for myeloma workup. EXAM: CT GUIDED BONE MARROW ASPIRATES AND BIOPSY Physician: Adam R. Henn, MD MEDICATIONS: None. ANESTHESIA/SEDATION: Fentanyl 100 mcg IV; Versed 2.0 mg   IV Moderate Sedation Time:  10 minutes The patient was continuously monitored during the procedure by the interventional radiology nurse under my direct supervision. COMPLICATIONS: None immediate. PROCEDURE: The procedure was explained to the patient. The risks and benefits of the procedure were discussed and the patient's questions were addressed. Informed consent was obtained from the patient. The patient was placed prone on CT table. Images of the pelvis were  obtained. The right side of back was prepped and draped in sterile fashion. The skin and right posterior ilium were anesthetized with 1% lidocaine. 11 gauge bone needle was directed into the right ilium with CT guidance. Two aspirates and one core biopsy were obtained. Bandage placed over the puncture site. IMPRESSION: CT guided bone marrow aspiration and core biopsy. Electronically Signed   By: Adam  Henn M.D.   On: 06/25/2020 11:37   NCV with EMG (electromyography)  Result Date: 06/21/2020 Newton, Frederic, MD     06/24/2020 11:38 AM EMG & NCV Findings: Evaluation of the right median (across palm) sensory nerve showed no response (Palm).  All remaining nerves (as indicated in the following tables) were within normal limits.  All examined muscles (as indicated in the following table) showed no evidence of electrical instability.  Impression: Essentially NORMAL electrodiagnostic study of the right upper limb.  There is no significant electrodiagnostic evidence of nerve entrapment, brachial plexopathy or cervical radiculopathy. As you know, purely sensory or demyelinating radiculopathies and chemical radiculitis may not be detected with this particular electrodiagnostic study. Pain and weakness likely from the proximal humerus metastasis. Recommendations: 1.  Follow-up with referring physician. 2.  Continue current management of symptoms. ___________________________ Fred Newton FAAPMR Board Certified, American Board of Physical Medicine and Rehabilitation Nerve Conduction Studies Anti Sensory Summary Table  Stim Site NR Peak (ms) Norm Peak (ms) P-T Amp (V) Norm P-T Amp Site1 Site2 Delta-P (ms) Dist (cm) Vel (m/s) Norm Vel (m/s) Right Median Acr Palm Anti Sensory (2nd Digit)  30.9C Wrist    3.5 <3.6 37.8 >10 Wrist Palm  0.0   Palm *NR  <2.0         Right Radial Anti Sensory (Base 1st Digit)  31.1C Wrist    2.1 <3.1 42.4  Wrist Base 1st Digit 2.1 0.0   Right Ulnar Anti Sensory (5th Digit)  31.2C Wrist    3.3  <3.7 18.4 >15.0 Wrist 5th Digit 3.3 14.0 42 >38 Motor Summary Table  Stim Site NR Onset (ms) Norm Onset (ms) O-P Amp (mV) Norm O-P Amp Site1 Site2 Delta-0 (ms) Dist (cm) Vel (m/s) Norm Vel (m/s) Right Median Motor (Abd Poll Brev)  31.2C Wrist    3.4 <4.2 7.2 >5 Elbow Wrist 3.9 22.5 58 >50 Elbow    7.3  4.8        Right Ulnar Motor (Abd Dig Min)  31.3C Wrist    2.8 <4.2 3.7 >3 B Elbow Wrist 3.8 22.0 58 >53 B Elbow    6.6  5.9  A Elbow B Elbow 1.6 10.0 63 >53 A Elbow    8.2  6.0  Axilla A Elbow 5.6 0.0   Axilla    13.8  0.0        EMG  Side Muscle Nerve Root Ins Act Fibs Psw Amp Dur Poly Recrt Int Pat Comment Right 1stDorInt Ulnar C8-T1 Nml Nml Nml Nml Nml 0 Nml Nml  Right Abd Poll Brev Median C8-T1 Nml Nml Nml Nml Nml 0 Nml Nml  Right ExtDigCom   Nml Nml Nml Nml   Nml 0 Nml Nml  Right Triceps Radial C6-7-8 Nml Nml Nml Nml Nml 0 Nml Nml  Right Deltoid Axillary C5-6 Nml Nml Nml Nml Nml 0 Nml Nml  Nerve Conduction Studies Anti Sensory Left/Right Comparison  Stim Site L Lat (ms) R Lat (ms) L-R Lat (ms) L Amp (V) R Amp (V) L-R Amp (%) Site1 Site2 L Vel (m/s) R Vel (m/s) L-R Vel (m/s) Median Acr Palm Anti Sensory (2nd Digit)  30.9C Wrist  3.5   37.8  Wrist Palm    Palm            Radial Anti Sensory (Base 1st Digit)  31.1C Wrist  2.1   42.4  Wrist Base 1st Digit    Ulnar Anti Sensory (5th Digit)  31.2C Wrist  3.3   18.4  Wrist 5th Digit  42  Motor Left/Right Comparison  Stim Site L Lat (ms) R Lat (ms) L-R Lat (ms) L Amp (mV) R Amp (mV) L-R Amp (%) Site1 Site2 L Vel (m/s) R Vel (m/s) L-R Vel (m/s) Median Motor (Abd Poll Brev)  31.2C Wrist  3.4   7.2  Elbow Wrist  58  Elbow  7.3   4.8       Ulnar Motor (Abd Dig Min)  31.3C Wrist  2.8   3.7  B Elbow Wrist  58  B Elbow  6.6   5.9  A Elbow B Elbow  63  A Elbow  8.2   6.0  Axilla A Elbow    Axilla  13.8   0.0       Waveforms:        CT BONE MARROW BIOPSY & ASPIRATION  Result Date: 06/25/2020 INDICATION: 69-year-old with a bone lesion in the proximal right humerus. Bone  marrow biopsy needed for myeloma workup. EXAM: CT GUIDED BONE MARROW ASPIRATES AND BIOPSY Physician: Adam R. Henn, MD MEDICATIONS: None. ANESTHESIA/SEDATION: Fentanyl 100 mcg IV; Versed 2.0 mg IV Moderate Sedation Time:  10 minutes The patient was continuously monitored during the procedure by the interventional radiology nurse under my direct supervision. COMPLICATIONS: None immediate. PROCEDURE: The procedure was explained to the patient. The risks and benefits of the procedure were discussed and the patient's questions were addressed. Informed consent was obtained from the patient. The patient was placed prone on CT table. Images of the pelvis were obtained. The right side of back was prepped and draped in sterile fashion. The skin and right posterior ilium were anesthetized with 1% lidocaine. 11 gauge bone needle was directed into the right ilium with CT guidance. Two aspirates and one core biopsy were obtained. Bandage placed over the puncture site. IMPRESSION: CT guided bone marrow aspiration and core biopsy. Electronically Signed   By: Adam  Henn M.D.   On: 06/25/2020 11:37    ASSESSMENT AND PLAN: This is a very pleasant 69 years old African-American female with plasmacytoma of the proximal right humerus diagnosed in February 2022.  The patient had extensive work-up for multiple myeloma including a bone marrow biopsy and aspirate that showed only 6% plasma cells.  Her protein studies still suspicious for underlying MGUS/multiple myeloma but the findings are not enough to call it multiple myeloma at this point. She was supposed to have skeletal bone survey which was not performed.  I will make sure that we get her skeletal bone survey scheduled in the next few days. For the painful plasmacytoma of the proximal right humerus, I will refer the patient to radiation oncology for consideration of palliative   radiotherapy to this area. I discussed the biopsy results as well as the myeloma panel with the patient  and her husband I recommended for her to continue on observation for now with repeat multiple myeloma in 3 months. If the skeletal bone survey showed any other concerning lesions, I will call the patient with recommendation. The patient was advised to call immediately if she has any other concerning symptoms in the interval. The patient voices understanding of current disease status and treatment options and is in agreement with the current care plan.  All questions were answered. The patient knows to call the clinic with any problems, questions or concerns. We can certainly see the patient much sooner if necessary.  The total time spent in the appointment was 35 minutes.  Disclaimer: This note was dictated with voice recognition software. Similar sounding words can inadvertently be transcribed and may not be corrected upon review.       

## 2020-07-01 NOTE — Progress Notes (Incomplete)
Histology and Location of Primary Cancer: Plasmacytoma of the proximal humerus- suspicious for early multiple myeloma  Sites of Visceral and Bony Metastatic Disease: Proximal Humerus  Bone Survey 07/03/2020  MRI Shoulder 06/04/2020: Mass in the proximal right humerus has an appearance highly suspicious for metastatic disease or other carcinoma such as plasmacytoma.  Bone Marrow Biopsy 06/25/2020   Past/Anticipated chemotherapy by medical oncology, if any:  Dr. Julien Nordmann 06/28/2020 - patient will be referred to radiation oncology for palliative radiotherapy to the plasmacytoma of the proximal right humerus. -I discussed the biopsy results as well as the myeloma panel with the patient and her husband I recommended for her to continue on observation for now with repeat multiple myeloma in 3 months. -If the skeletal bone survey showed any other concerning lesions, I will call the patient with recommendation.  Pain on a scale of 0-10 is:    Ambulatory status? Walker? Wheelchair?:   SAFETY ISSUES:  Prior radiation?   Pacemaker/ICD?   Possible current pregnancy?   Is the patient on methotrexate?   Current Complaints / other details:

## 2020-07-02 ENCOUNTER — Ambulatory Visit
Admission: RE | Admit: 2020-07-02 | Discharge: 2020-07-02 | Disposition: A | Discharge status: Home / Self Care | Payer: Medicare HMO | Source: Ambulatory Visit | Attending: Radiation Oncology | Admitting: Radiation Oncology

## 2020-07-02 ENCOUNTER — Encounter: Payer: Self-pay | Admitting: Radiation Oncology

## 2020-07-02 ENCOUNTER — Telehealth: Payer: Self-pay | Admitting: Internal Medicine

## 2020-07-02 ENCOUNTER — Other Ambulatory Visit: Payer: Self-pay

## 2020-07-02 VITALS — BP 163/91 | HR 52 | Temp 96.9°F | Resp 18 | Ht 65.0 in | Wt 197.5 lb

## 2020-07-02 DIAGNOSIS — R531 Weakness: Secondary | ICD-10-CM | POA: Insufficient documentation

## 2020-07-02 DIAGNOSIS — K219 Gastro-esophageal reflux disease without esophagitis: Secondary | ICD-10-CM | POA: Diagnosis not present

## 2020-07-02 DIAGNOSIS — C9030 Solitary plasmacytoma not having achieved remission: Secondary | ICD-10-CM

## 2020-07-02 DIAGNOSIS — E785 Hyperlipidemia, unspecified: Secondary | ICD-10-CM | POA: Diagnosis not present

## 2020-07-02 DIAGNOSIS — Z803 Family history of malignant neoplasm of breast: Secondary | ICD-10-CM | POA: Diagnosis not present

## 2020-07-02 DIAGNOSIS — Z87891 Personal history of nicotine dependence: Secondary | ICD-10-CM | POA: Insufficient documentation

## 2020-07-02 DIAGNOSIS — I10 Essential (primary) hypertension: Secondary | ICD-10-CM | POA: Diagnosis not present

## 2020-07-02 DIAGNOSIS — Z79899 Other long term (current) drug therapy: Secondary | ICD-10-CM | POA: Insufficient documentation

## 2020-07-02 DIAGNOSIS — Z7982 Long term (current) use of aspirin: Secondary | ICD-10-CM | POA: Diagnosis not present

## 2020-07-02 NOTE — Progress Notes (Signed)
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   HISTORY OF PRESENT ILLNESS: Joanna Ray is a 70 y.o. female seen at the request of Dr. Julien Nordmann for a diagnosis of plasmacytoma suspicious for early multiple myeloma.  The patient recent complained of shoulder pain in the right arm with limited range of motion and weakness since October 2021 without associated injury.  She was seen by orthopedics and an MRI of the shoulder revealed a 5.3 cm expansile lesion in the proximal diaphysis  of the humerus extending through the cortex medially and posteriorly with some edema surrounding the lesion.  She underwent further evaluation with myeloma panel and features were suspicious for myelomatous changes versus plasmacytoma.  She did undergo a CT-guided bone marrow biopsy which showed hypercellular bone marrow for age with hematopoiesis and 6% plasmacytosis.  She is going to proceed with a bone survey, but is seen to discuss options of radiotherapy for her pain in the right humerus.   PREVIOUS RADIATION THERAPY: No   PAST MEDICAL HISTORY:  Past Medical History:  Diagnosis Date  . Anxiety   . GERD (gastroesophageal reflux disease)   . Hyperlipidemia   . Hypertension   . Urticaria        PAST SURGICAL HISTORY: Past Surgical History:  Procedure Laterality Date  . CATARACT EXTRACTION, BILATERAL Bilateral   . DILATION AND CURETTAGE OF UTERUS       FAMILY HISTORY:  Family History  Problem Relation Age of Onset  . Stroke Mother   . Stroke Father   . Diabetes Sister   . Breast cancer Sister   . Colon cancer Neg Hx   . Colon polyps Neg Hx   . Esophageal cancer Neg Hx   .  Rectal cancer Neg Hx   . Stomach cancer Neg Hx      SOCIAL HISTORY:  reports that she quit smoking about 42 years ago. Her smoking use included cigarettes. She has a 5.00 pack-year smoking history. She has never used smokeless tobacco. She reports that she does not drink alcohol and does not use drugs.The patient is married and lives in [place]. 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 She has adult children and helps also care for grandchildren.   ALLERGIES: Shellfish allergy   MEDICATIONS:  Current Outpatient Medications  Medication Sig Dispense Refill  . ALPRAZolam (XANAX) 0.5 MG tablet Take 1 tablet by mouth twice daily as needed for anxiety 60 tablet 5  . aspirin 81 MG tablet Take 81 mg by mouth daily.    . cholecalciferol (VITAMIN D) 1000 UNITS tablet Take 1,000 Units by mouth daily.    Marland Kitchen EPINEPHrine 0.3 mg/0.3 mL IJ SOAJ injection Inject 0.3 mLs (0.3 mg total) into the muscle as needed for anaphylaxis. 1 each 5  . escitalopram (LEXAPRO) 10 MG tablet 1 tab(s)    . fexofenadine (ALLEGRA) 180 MG tablet Take 1 tablet (180 mg total) by mouth daily. 90 tablet 1  . hydrocortisone valerate cream (WESTCORT) 0.2 % 1 app    . rosuvastatin (CRESTOR) 20 MG tablet Take 1/2 (one-half) tablet by mouth once daily 45 tablet 1  . traMADol (ULTRAM) 50  MG tablet Take 1 tablet (50 mg total) by mouth every 6 (six) hours as needed. 30 tablet 0  . valsartan-hydrochlorothiazide (DIOVAN-HCT) 160-25 MG tablet Take 1 tablet by mouth once daily 90 tablet 0  . vitamin E 100 UNIT capsule Take 100 Units by mouth daily.     No current facility-administered medications for this encounter.     REVIEW OF SYSTEMS: On review of systems, the patient reports that she is doing well overall. She does describe pain in her right shoulder with motion, lifting, and that this is throbbing, and sometimes shooting as well as deep and aching. She denies any chest pain, shortness of breath, cough, fevers, chills, night  sweats, unintended weight changes. She denies any bowel or bladder disturbances, and denies abdominal pain, nausea or vomiting. She denies any new musculoskeletal or joint aches or pains. A complete review of systems is obtained and is otherwise negative.     PHYSICAL EXAM:  Wt Readings from Last 3 Encounters:  07/02/20 197 lb 8 oz (89.6 kg)  06/28/20 198 lb 11.2 oz (90.1 kg)  06/11/20 199 lb 3.2 oz (90.4 kg)   Temp Readings from Last 3 Encounters:  07/02/20 (!) 96.9 F (36.1 C) (Temporal)  06/28/20 (!) 97.5 F (36.4 C) (Tympanic)  06/25/20 98.4 F (36.9 C) (Oral)   BP Readings from Last 3 Encounters:  07/02/20 (!) 163/91  06/28/20 (!) 167/86  06/25/20 (!) 146/86   Pulse Readings from Last 3 Encounters:  07/02/20 (!) 52  06/28/20 67  06/25/20 (!) 59   Pain Assessment Pain Score: 0-No pain/10  In general this is a well appearing African American female in no acute distress. She's alert and oriented x4 and appropriate throughout the examination. Cardiopulmonary assessment is negative for acute distress and she exhibits normal effort.    ECOG = 1  0 - Asymptomatic (Fully active, able to carry on all predisease activities without restriction)  1 - Symptomatic but completely ambulatory (Restricted in physically strenuous activity but ambulatory and able to carry out work of a light or sedentary nature. For example, light housework, office work)  2 - Symptomatic, <50% in bed during the day (Ambulatory and capable of all self care but unable to carry out any work activities. Up and about more than 50% of waking hours)  3 - Symptomatic, >50% in bed, but not bedbound (Capable of only limited self-care, confined to bed or chair 50% or more of waking hours)  4 - Bedbound (Completely disabled. Cannot carry on any self-care. Totally confined to bed or chair)  5 - Death   Eustace Pen MM, Creech RH, Tormey DC, et al. [phone]). "Toxicity and response criteria of the [facility]". [place] Oncol. 5 (6): 649-55    LABORATORY DATA:  Lab Results  Component Value Date   WBC 4.4 06/25/2020   HGB 12.1 06/25/2020   HCT 37.0 06/25/2020   MCV 94.4 06/25/2020   PLT 217 06/25/2020   Lab Results  Component Value Date   NA 140 06/06/2020   K 3.0 (L) 06/06/2020   CL 101 06/06/2020   CO2 29 06/06/2020   Lab Results  Component Value Date   ALT 10 06/06/2020   AST 15 06/06/2020   ALKPHOS 73 06/06/2020   BILITOT 0.7 06/06/2020      RADIOGRAPHY: MR SHOULDER RIGHT WO CONTRAST  Result Date: 06/05/2020 CLINICAL DATA:  Onset right shoulder pain, weakness and limited range of motion in October, 2021. No known injury.  EXAM: MRI OF THE RIGHT SHOULDER WITHOUT CONTRAST TECHNIQUE: Multiplanar, multisequence MR imaging of the shoulder was performed. No intravenous contrast was administered. COMPARISON:  None. FINDINGS: Rotator cuff:  Intact with moderate tendinopathy noted. Muscles: No atrophy. A cyst measuring 1.3 cm transverse by 0.6 cm craniocaudal by 0.9 cm AP inferior to the supraspinatus is most consistent with a ganglion. Mild edema is seen subjacent to all of the rotator cuff muscles. Biceps long head: Intact. There is tendinopathy of the intra-articular segment. Acromioclavicular Joint: Mild-to-moderate osteoarthritis. Type 2 acromion. Meso-acromion type os acromiale with degenerative change about the synchondrosis noted. Glenohumeral Joint: Mild degenerative disease. Labrum:  Appears intact. Bones: There is an expansile lesion in the proximal diaphysis and metaphysis of the humerus which measures 5.3 cm craniocaudal by approximately 3.5 cm AP x 3 cm transverse. The lesion extends through the cortex medially and posteriorly. There is some edema surrounding the lesion. No other focal bony lesion is identified. Other: None. IMPRESSION: Mass in the proximal right humerus has an appearance highly suspicious for metastatic disease or other carcinoma such as  plasmacytoma. Rotator cuff and intra-articular long head of biceps tendinopathy without tear. Mild to moderate acromioclavicular osteoarthritis. Os acromiale also noted. Small ganglion cyst subjacent to the supraspinatus muscle belly. Mild edema in the rotator cuff musculature is that mild edema along the inferior aspect of the rotator cuff musculature may be due to strain. These results will be called to the ordering clinician or representative by the Radiologist Assistant, and communication documented in the PACS or [employer]. Electronically Signed   By: Inge Rise M.D.   On: 06/05/2020 10:54   CT Biopsy  Result Date: 06/25/2020 INDICATION: 70 year old with a bone lesion in the proximal right humerus. Bone marrow biopsy needed for myeloma workup. EXAM: CT GUIDED BONE MARROW ASPIRATES AND BIOPSY Physician: Stephan Minister. Anselm Pancoast, MD MEDICATIONS: None. ANESTHESIA/SEDATION: Fentanyl 100 mcg IV; Versed 2.0 mg IV Moderate Sedation Time:  10 minutes The patient was continuously monitored during the procedure by the interventional radiology nurse under my direct supervision. COMPLICATIONS: None immediate. PROCEDURE: The procedure was explained to the patient. The risks and benefits of the procedure were discussed and the patient's questions were addressed. Informed consent was obtained from the patient. The patient was placed prone on CT table. Images of the pelvis were obtained. The right side of back was prepped and draped in sterile fashion. The skin and right posterior ilium were anesthetized with 1% lidocaine. 11 gauge bone needle was directed into the right ilium with CT guidance. Two aspirates and one core biopsy were obtained. Bandage placed over the puncture site. IMPRESSION: CT guided bone marrow aspiration and core biopsy. Electronically Signed   By: Markus Daft M.D.   On: 06/25/2020 11:37   NCV with EMG (electromyography)  Result Date: 06/21/2020 Magnus Sinning, MD     06/24/2020 11:38 AM EMG & NCV  Findings: Evaluation of the right median (across palm) sensory nerve showed no response (Palm).  All remaining nerves (as indicated in the following tables) were within normal limits.  All examined muscles (as indicated in the following table) showed no evidence of electrical instability.  Impression: Essentially NORMAL electrodiagnostic study of the right upper limb.  There is no significant electrodiagnostic evidence of nerve entrapment, brachial plexopathy or cervical radiculopathy. As you know, purely sensory or demyelinating radiculopathies and chemical radiculitis may not be detected with this particular electrodiagnostic study. Pain and weakness likely from the proximal humerus metastasis. Recommendations: 1.  Follow-up  with referring physician. 2.  Continue current management of symptoms. ___________________________ Laurence Spates FAAPMR Board Certified, American Board of Physical Medicine and Rehabilitation Nerve Conduction Studies Anti Sensory Summary Table  Stim Site NR Peak (ms) Norm Peak (ms) P-T Amp (V) Norm P-T Amp Site1 Site2 Delta-P (ms) Dist (cm) Vel (m/s) Norm Vel (m/s) Right Median Acr Palm Anti Sensory (2nd Digit)  30.9C Wrist    3.5 <3.6 37.8 >10 Wrist Palm  0.0   Palm *NR  <2.0         Right Radial Anti Sensory (Base 1st Digit)  31.1C Wrist    2.1 <3.1 42.4  Wrist Base 1st Digit 2.1 0.0   Right Ulnar Anti Sensory (5th Digit)  31.2C Wrist    3.3 <3.7 18.4 >15.0 Wrist 5th Digit 3.3 14.0 42 >38 Motor Summary Table  Stim Site NR Onset (ms) Norm Onset (ms) O-P Amp (mV) Norm O-P Amp Site1 Site2 Delta-0 (ms) Dist (cm) Vel (m/s) Norm Vel (m/s) Right Median Motor (Abd Poll Brev)  31.2C Wrist    3.4 <4.2 7.2 >5 Elbow Wrist 3.9 22.5 58 >50 Elbow    7.3  4.8        Right Ulnar Motor (Abd Dig Min)  31.3C Wrist    2.8 <4.2 3.7 >3 B Elbow Wrist 3.8 22.0 58 >53 B Elbow    6.6  5.9  A Elbow B Elbow 1.6 10.0 63 >53 A Elbow    8.2  6.0  Axilla A Elbow 5.6 0.0   Axilla    13.8  0.0        EMG  Side Muscle  Nerve Root Ins Act Fibs Psw Amp Dur Poly Recrt Int Fraser Din Comment Right 1stDorInt Ulnar C8-T1 Nml Nml Nml Nml Nml 0 Nml Nml  Right Abd Poll Brev Median C8-T1 Nml Nml Nml Nml Nml 0 Nml Nml  Right ExtDigCom   Nml Nml Nml Nml Nml 0 Nml Nml  Right Triceps Radial C6-7-8 Nml Nml Nml Nml Nml 0 Nml Nml  Right Deltoid Axillary C5-6 Nml Nml Nml Nml Nml 0 Nml Nml  Nerve Conduction Studies Anti Sensory Left/Right Comparison  Stim Site L Lat (ms) R Lat (ms) L-R Lat (ms) L Amp (V) R Amp (V) L-R Amp (%) Site1 Site2 L Vel (m/s) R Vel (m/s) L-R Vel (m/s) Median Acr Palm Anti Sensory (2nd Digit)  30.9C Wrist  3.5   37.8  Wrist Palm    Palm            Radial Anti Sensory (Base 1st Digit)  31.1C Wrist  2.1   42.4  Wrist Base 1st Digit    Ulnar Anti Sensory (5th Digit)  31.2C Wrist  3.3   18.4  Wrist 5th Digit  42  Motor Left/Right Comparison  Stim Site L Lat (ms) R Lat (ms) L-R Lat (ms) L Amp (mV) R Amp (mV) L-R Amp (%) Site1 Site2 L Vel (m/s) R Vel (m/s) L-R Vel (m/s) Median Motor (Abd Poll Brev)  31.2C Wrist  3.4   7.2  Elbow Wrist  58  Elbow  7.3   4.8       Ulnar Motor (Abd Dig Min)  31.3C Wrist  2.8   3.7  B Elbow Wrist  58  B Elbow  6.6   5.9  A Elbow B Elbow  63  A Elbow  8.2   6.0  Axilla A Elbow    Axilla  13.8   0.0  Waveforms:        CT BONE MARROW BIOPSY & ASPIRATION  Result Date: 06/25/2020 INDICATION: 70 year old with a bone lesion in the proximal right humerus. Bone marrow biopsy needed for myeloma workup. EXAM: CT GUIDED BONE MARROW ASPIRATES AND BIOPSY Physician: Stephan Minister. Anselm Pancoast, MD MEDICATIONS: None. ANESTHESIA/SEDATION: Fentanyl 100 mcg IV; Versed 2.0 mg IV Moderate Sedation Time:  10 minutes The patient was continuously monitored during the procedure by the interventional radiology nurse under my direct supervision. COMPLICATIONS: None immediate. PROCEDURE: The procedure was explained to the patient. The risks and benefits of the procedure were discussed and the patient's questions were addressed. Informed  consent was obtained from the patient. The patient was placed prone on CT table. Images of the pelvis were obtained. The right side of back was prepped and draped in sterile fashion. The skin and right posterior ilium were anesthetized with 1% lidocaine. 11 gauge bone needle was directed into the right ilium with CT guidance. Two aspirates and one core biopsy were obtained. Bandage placed over the puncture site. IMPRESSION: CT guided bone marrow aspiration and core biopsy. Electronically Signed   By: Markus Daft M.D.   On: 06/25/2020 11:37       IMPRESSION/PLAN: 1. Plasmacytoma involving the right humerus. Dr. Lisbeth Renshaw discusses the pathology findings from the patient's  and reviews the nature of bone marrow based malignancies. While her tumor is considered a plasmacytoma at this time, she is aware that there is a high probability of transformation into myeloma. Dr. Lisbeth Renshaw discusses the utility of radiotherapy for her isolated lesion in the right humerus but will also follow up with the results of her bone survey tomorrow. We discussed the risks, benefits, short, and long term effects of radiotherapy, as well as the curative intent as well as palliative effect of her symptoms, and the patient is interested in proceeding. Dr. Lisbeth Renshaw discusses the delivery and logistics of radiotherapy and anticipates a course of 5 weeks of radiotherapy to the right humerus. Written consent is obtained and placed in the chart, a copy was provided to the patient. She will simulate on Friday morning and start treatment next week. She will also follow up with Dr. Julien Nordmann regarding bone survey findings and in long term surveillance.  In a visit lasting 60 minutes, greater than 50% of the time was spent face to face discussing the patient's condition, in preparation for the discussion, and coordinating the patient's care.   The above documentation reflects my direct findings during this shared patient visit. Please see the separate note  by Dr. Lisbeth Renshaw on this date for the remainder of the patient's plan of care.    Carola Rhine, [facility]   **Disclaimer: This note was dictated with voice recognition software. Similar sounding words can inadvertently be transcribed and this note may contain transcription errors which may not have been corrected upon publication of note.**

## 2020-07-02 NOTE — Telephone Encounter (Signed)
Scheduled per los. Called and left msg. Mailed printout  °

## 2020-07-02 NOTE — Progress Notes (Signed)
Histology and Location of Primary Cancer: Plasmacytoma of the proximal humerus- suspicious for early multiple myeloma  Sites of Visceral and Bony Metastatic Disease: Proximal Humerus  Bone Survey 07/03/2020  MRI Shoulder 06/04/2020: Mass in the proximal right humerus has an appearance highly suspicious for metastatic disease or other carcinoma such as plasmacytoma.  Bone Marrow Biopsy 06/25/2020   Past/Anticipated chemotherapy by medical oncology, if any:  Dr. Julien Nordmann 06/28/2020 - patient will be referred to radiation oncology for palliative radiotherapy to the plasmacytoma of the proximal right humerus. -I discussed the biopsy results as well as the myeloma panel with the patient and her husband I recommended for her to continue on observation for now with repeat multiple myeloma in 3 months. -If the skeletal bone survey showed any other concerning lesions, I will call the patient with recommendation.  Pain on a scale of 0-10 is: No pain today.  Normally has pain in her right shoulder.   Ambulatory status? Walker? Wheelchair?:   SAFETY ISSUES:  Prior radiation? No  Pacemaker/ICD? No   Possible current pregnancy? Postmenopausal  Is the patient on methotrexate? No  Current Complaints / other details:

## 2020-07-02 NOTE — Addendum Note (Signed)
Encounter addended by: Cori Razor, RN on: 07/02/2020 3:40 PM  Actions taken: Flowsheet accepted, Actions taken from a BestPractice Advisory, Order list changed, Diagnosis association updated

## 2020-07-03 ENCOUNTER — Ambulatory Visit (HOSPITAL_COMMUNITY)
Admission: RE | Admit: 2020-07-03 | Discharge: 2020-07-03 | Disposition: A | Discharge status: Home / Self Care | Payer: Medicare HMO | Source: Ambulatory Visit | Attending: Internal Medicine | Admitting: Internal Medicine

## 2020-07-03 ENCOUNTER — Encounter: Payer: Self-pay | Admitting: *Deleted

## 2020-07-03 DIAGNOSIS — C9000 Multiple myeloma not having achieved remission: Secondary | ICD-10-CM | POA: Diagnosis not present

## 2020-07-03 DIAGNOSIS — C7951 Secondary malignant neoplasm of bone: Secondary | ICD-10-CM | POA: Diagnosis not present

## 2020-07-03 NOTE — Progress Notes (Signed)
[place] Psychosocial Distress Screening Clinical Social Work  Clinical Social Work was referred by distress screening protocol.  The patient scored a 7 on the Psychosocial Distress Thermometer which indicates moderate distress. Clinical Social Worker contacted patient by phone and left a voicemail offering support and information on the support groups and programs at [facility].  CSW provided contact information and encouraged patient to return call.   ONCBCN DISTRESS SCREENING 07/02/2020  Screening Type Initial Screening  Distress experienced in past week (1-10) 7  Emotional problem type Nervousness/Anxiety  Physical Problem type Pain    Johnnye Lana, MSW, LCSW, OSW-C Clinical Social Worker [place] [phone]

## 2020-07-04 ENCOUNTER — Encounter (HOSPITAL_COMMUNITY): Payer: Self-pay | Admitting: Internal Medicine

## 2020-07-04 ENCOUNTER — Telehealth: Payer: Self-pay | Admitting: Medical Oncology

## 2020-07-04 ENCOUNTER — Encounter: Payer: Self-pay | Admitting: General Practice

## 2020-07-04 NOTE — Progress Notes (Signed)
[place] Psychosocial Distress Screening Clinical Social Work  Clinical Social Work was referred by distress screening protocol.  The patient scored a 7 on the Psychosocial Distress Thermometer which indicates moderate distress. Clinical Social Worker contacted patient by phone to assess for distress and other psychosocial needs. No needs at this time, has good support.  CSW will mail her information about [place], encouraged her to participate as she desires.  ONCBCN DISTRESS SCREENING 07/02/2020  Screening Type Initial Screening  Distress experienced in past week (1-10) 7  Emotional problem type Nervousness/Anxiety  Physical Problem type Pain    Clinical Social Worker follow up needed: No.  If yes, follow up plan:  Beverely Pace, [place], LCSW Clinical Social Worker Phone:  [phone]

## 2020-07-04 NOTE — Telephone Encounter (Signed)
Thanks we will simulate tomorrow and start treatment next week.

## 2020-07-04 NOTE — Telephone Encounter (Signed)
Called report - see Bone Survey results-  "IMPRESSION: Lytic lesion within the proximal humeral metadiaphysis complicated by pathologic fracture of the surgical neck of the humerus."  Next med onc appt June.

## 2020-07-05 ENCOUNTER — Ambulatory Visit
Admission: RE | Admit: 2020-07-05 | Discharge: 2020-07-05 | Disposition: A | Discharge status: Home / Self Care | Payer: Medicare HMO | Source: Ambulatory Visit | Attending: Radiation Oncology | Admitting: Radiation Oncology

## 2020-07-05 ENCOUNTER — Other Ambulatory Visit: Payer: Self-pay

## 2020-07-05 DIAGNOSIS — C9030 Solitary plasmacytoma not having achieved remission: Secondary | ICD-10-CM | POA: Insufficient documentation

## 2020-07-05 DIAGNOSIS — Z510 Encounter for antineoplastic radiation therapy: Secondary | ICD-10-CM | POA: Diagnosis not present

## 2020-07-09 ENCOUNTER — Encounter (HOSPITAL_COMMUNITY): Payer: Self-pay | Admitting: Internal Medicine

## 2020-07-12 DIAGNOSIS — C9030 Solitary plasmacytoma not having achieved remission: Secondary | ICD-10-CM | POA: Diagnosis not present

## 2020-07-12 DIAGNOSIS — Z510 Encounter for antineoplastic radiation therapy: Secondary | ICD-10-CM | POA: Diagnosis not present

## 2020-07-15 ENCOUNTER — Ambulatory Visit
Admission: RE | Admit: 2020-07-15 | Discharge: 2020-07-15 | Disposition: A | Discharge status: Home / Self Care | Payer: Medicare HMO | Source: Ambulatory Visit | Attending: Radiation Oncology | Admitting: Radiation Oncology

## 2020-07-15 DIAGNOSIS — Z510 Encounter for antineoplastic radiation therapy: Secondary | ICD-10-CM | POA: Diagnosis not present

## 2020-07-15 DIAGNOSIS — C9030 Solitary plasmacytoma not having achieved remission: Secondary | ICD-10-CM | POA: Diagnosis not present

## 2020-07-16 ENCOUNTER — Ambulatory Visit
Admission: RE | Admit: 2020-07-16 | Discharge: 2020-07-16 | Disposition: A | Discharge status: Home / Self Care | Payer: Medicare HMO | Source: Ambulatory Visit | Attending: Radiation Oncology | Admitting: Radiation Oncology

## 2020-07-16 ENCOUNTER — Other Ambulatory Visit: Payer: Self-pay

## 2020-07-16 DIAGNOSIS — Z510 Encounter for antineoplastic radiation therapy: Secondary | ICD-10-CM | POA: Diagnosis not present

## 2020-07-16 DIAGNOSIS — C9030 Solitary plasmacytoma not having achieved remission: Secondary | ICD-10-CM | POA: Diagnosis not present

## 2020-07-17 ENCOUNTER — Ambulatory Visit
Admission: RE | Admit: 2020-07-17 | Discharge: 2020-07-17 | Disposition: A | Discharge status: Home / Self Care | Payer: Medicare HMO | Source: Ambulatory Visit | Attending: Radiation Oncology | Admitting: Radiation Oncology

## 2020-07-17 DIAGNOSIS — Z510 Encounter for antineoplastic radiation therapy: Secondary | ICD-10-CM | POA: Diagnosis not present

## 2020-07-17 DIAGNOSIS — C9030 Solitary plasmacytoma not having achieved remission: Secondary | ICD-10-CM | POA: Diagnosis not present

## 2020-07-18 ENCOUNTER — Ambulatory Visit
Admission: RE | Admit: 2020-07-18 | Discharge: 2020-07-18 | Disposition: A | Discharge status: Home / Self Care | Payer: Medicare HMO | Source: Ambulatory Visit | Attending: Radiation Oncology | Admitting: Radiation Oncology

## 2020-07-18 ENCOUNTER — Other Ambulatory Visit: Payer: Self-pay

## 2020-07-18 DIAGNOSIS — Z510 Encounter for antineoplastic radiation therapy: Secondary | ICD-10-CM | POA: Diagnosis not present

## 2020-07-18 DIAGNOSIS — C9030 Solitary plasmacytoma not having achieved remission: Secondary | ICD-10-CM | POA: Diagnosis not present

## 2020-07-19 ENCOUNTER — Ambulatory Visit
Admission: RE | Admit: 2020-07-19 | Discharge: 2020-07-19 | Disposition: A | Discharge status: Home / Self Care | Payer: Medicare HMO | Source: Ambulatory Visit | Attending: Radiation Oncology | Admitting: Radiation Oncology

## 2020-07-19 DIAGNOSIS — Z510 Encounter for antineoplastic radiation therapy: Secondary | ICD-10-CM | POA: Diagnosis not present

## 2020-07-19 DIAGNOSIS — C9030 Solitary plasmacytoma not having achieved remission: Secondary | ICD-10-CM | POA: Diagnosis not present

## 2020-07-22 ENCOUNTER — Other Ambulatory Visit: Payer: Self-pay

## 2020-07-22 ENCOUNTER — Ambulatory Visit
Admission: RE | Admit: 2020-07-22 | Discharge: 2020-07-22 | Disposition: A | Discharge status: Home / Self Care | Payer: Medicare HMO | Source: Ambulatory Visit | Attending: Radiation Oncology | Admitting: Radiation Oncology

## 2020-07-22 DIAGNOSIS — C9030 Solitary plasmacytoma not having achieved remission: Secondary | ICD-10-CM | POA: Diagnosis not present

## 2020-07-22 DIAGNOSIS — Z510 Encounter for antineoplastic radiation therapy: Secondary | ICD-10-CM | POA: Diagnosis not present

## 2020-07-23 ENCOUNTER — Ambulatory Visit
Admission: RE | Admit: 2020-07-23 | Discharge: 2020-07-23 | Disposition: A | Discharge status: Home / Self Care | Payer: Medicare HMO | Source: Ambulatory Visit | Attending: Radiation Oncology | Admitting: Radiation Oncology

## 2020-07-23 DIAGNOSIS — C9030 Solitary plasmacytoma not having achieved remission: Secondary | ICD-10-CM | POA: Diagnosis not present

## 2020-07-23 DIAGNOSIS — Z510 Encounter for antineoplastic radiation therapy: Secondary | ICD-10-CM | POA: Diagnosis not present

## 2020-07-24 ENCOUNTER — Ambulatory Visit: Payer: Medicare HMO

## 2020-07-25 ENCOUNTER — Ambulatory Visit
Admission: RE | Admit: 2020-07-25 | Discharge: 2020-07-25 | Disposition: A | Discharge status: Home / Self Care | Payer: Medicare HMO | Source: Ambulatory Visit | Attending: Radiation Oncology | Admitting: Radiation Oncology

## 2020-07-25 ENCOUNTER — Other Ambulatory Visit: Payer: Self-pay

## 2020-07-25 DIAGNOSIS — C9030 Solitary plasmacytoma not having achieved remission: Secondary | ICD-10-CM | POA: Diagnosis not present

## 2020-07-25 DIAGNOSIS — Z510 Encounter for antineoplastic radiation therapy: Secondary | ICD-10-CM | POA: Diagnosis not present

## 2020-07-26 ENCOUNTER — Other Ambulatory Visit: Payer: Self-pay

## 2020-07-26 ENCOUNTER — Ambulatory Visit
Admission: RE | Admit: 2020-07-26 | Discharge: 2020-07-26 | Disposition: A | Discharge status: Home / Self Care | Payer: Medicare HMO | Source: Ambulatory Visit | Attending: Radiation Oncology | Admitting: Radiation Oncology

## 2020-07-26 DIAGNOSIS — Z510 Encounter for antineoplastic radiation therapy: Secondary | ICD-10-CM | POA: Diagnosis not present

## 2020-07-26 DIAGNOSIS — C9030 Solitary plasmacytoma not having achieved remission: Secondary | ICD-10-CM | POA: Diagnosis not present

## 2020-07-29 ENCOUNTER — Ambulatory Visit
Admission: RE | Admit: 2020-07-29 | Discharge: 2020-07-29 | Disposition: A | Discharge status: Home / Self Care | Payer: Medicare HMO | Source: Ambulatory Visit | Attending: Radiation Oncology | Admitting: Radiation Oncology

## 2020-07-29 ENCOUNTER — Other Ambulatory Visit: Payer: Self-pay

## 2020-07-29 DIAGNOSIS — Z510 Encounter for antineoplastic radiation therapy: Secondary | ICD-10-CM | POA: Diagnosis not present

## 2020-07-29 DIAGNOSIS — C9030 Solitary plasmacytoma not having achieved remission: Secondary | ICD-10-CM | POA: Diagnosis not present

## 2020-07-30 ENCOUNTER — Ambulatory Visit
Admission: RE | Admit: 2020-07-30 | Discharge: 2020-07-30 | Disposition: A | Discharge status: Home / Self Care | Payer: Medicare HMO | Source: Ambulatory Visit | Attending: Radiation Oncology | Admitting: Radiation Oncology

## 2020-07-30 DIAGNOSIS — Z510 Encounter for antineoplastic radiation therapy: Secondary | ICD-10-CM | POA: Diagnosis not present

## 2020-07-30 DIAGNOSIS — C9030 Solitary plasmacytoma not having achieved remission: Secondary | ICD-10-CM | POA: Diagnosis not present

## 2020-07-31 ENCOUNTER — Ambulatory Visit
Admission: RE | Admit: 2020-07-31 | Discharge: 2020-07-31 | Disposition: A | Discharge status: Home / Self Care | Payer: Medicare HMO | Source: Ambulatory Visit | Attending: Radiation Oncology | Admitting: Radiation Oncology

## 2020-07-31 DIAGNOSIS — Z510 Encounter for antineoplastic radiation therapy: Secondary | ICD-10-CM | POA: Diagnosis not present

## 2020-07-31 DIAGNOSIS — C9030 Solitary plasmacytoma not having achieved remission: Secondary | ICD-10-CM | POA: Diagnosis not present

## 2020-08-01 ENCOUNTER — Ambulatory Visit
Admission: RE | Admit: 2020-08-01 | Discharge: 2020-08-01 | Disposition: A | Discharge status: Home / Self Care | Payer: Medicare HMO | Source: Ambulatory Visit | Attending: Radiation Oncology | Admitting: Radiation Oncology

## 2020-08-01 DIAGNOSIS — Z510 Encounter for antineoplastic radiation therapy: Secondary | ICD-10-CM | POA: Diagnosis not present

## 2020-08-01 DIAGNOSIS — C9030 Solitary plasmacytoma not having achieved remission: Secondary | ICD-10-CM | POA: Diagnosis not present

## 2020-08-02 ENCOUNTER — Ambulatory Visit
Admission: RE | Admit: 2020-08-02 | Discharge: 2020-08-02 | Disposition: A | Discharge status: Home / Self Care | Payer: Medicare HMO | Source: Ambulatory Visit | Attending: Radiation Oncology | Admitting: Radiation Oncology

## 2020-08-02 ENCOUNTER — Other Ambulatory Visit: Payer: Self-pay

## 2020-08-02 DIAGNOSIS — Z510 Encounter for antineoplastic radiation therapy: Secondary | ICD-10-CM | POA: Diagnosis not present

## 2020-08-02 DIAGNOSIS — C9030 Solitary plasmacytoma not having achieved remission: Secondary | ICD-10-CM | POA: Diagnosis not present

## 2020-08-05 ENCOUNTER — Ambulatory Visit
Admission: RE | Admit: 2020-08-05 | Discharge: 2020-08-05 | Disposition: A | Discharge status: Home / Self Care | Payer: Medicare HMO | Source: Ambulatory Visit | Attending: Radiation Oncology | Admitting: Radiation Oncology

## 2020-08-05 DIAGNOSIS — C9030 Solitary plasmacytoma not having achieved remission: Secondary | ICD-10-CM | POA: Diagnosis not present

## 2020-08-05 DIAGNOSIS — Z510 Encounter for antineoplastic radiation therapy: Secondary | ICD-10-CM | POA: Diagnosis not present

## 2020-08-06 ENCOUNTER — Ambulatory Visit: Payer: Medicare HMO

## 2020-08-06 ENCOUNTER — Other Ambulatory Visit: Payer: Self-pay

## 2020-08-06 ENCOUNTER — Ambulatory Visit
Admission: RE | Admit: 2020-08-06 | Discharge: 2020-08-06 | Disposition: A | Discharge status: Home / Self Care | Payer: Medicare HMO | Source: Ambulatory Visit | Attending: Radiation Oncology | Admitting: Radiation Oncology

## 2020-08-06 DIAGNOSIS — Z510 Encounter for antineoplastic radiation therapy: Secondary | ICD-10-CM | POA: Diagnosis not present

## 2020-08-06 DIAGNOSIS — C9030 Solitary plasmacytoma not having achieved remission: Secondary | ICD-10-CM | POA: Diagnosis not present

## 2020-08-07 ENCOUNTER — Ambulatory Visit: Payer: Medicare HMO

## 2020-08-07 ENCOUNTER — Ambulatory Visit
Admission: RE | Admit: 2020-08-07 | Discharge: 2020-08-07 | Disposition: A | Discharge status: Home / Self Care | Payer: Medicare HMO | Source: Ambulatory Visit | Attending: Radiation Oncology | Admitting: Radiation Oncology

## 2020-08-07 DIAGNOSIS — C9030 Solitary plasmacytoma not having achieved remission: Secondary | ICD-10-CM | POA: Diagnosis not present

## 2020-08-07 DIAGNOSIS — Z510 Encounter for antineoplastic radiation therapy: Secondary | ICD-10-CM | POA: Diagnosis not present

## 2020-08-08 ENCOUNTER — Other Ambulatory Visit: Payer: Self-pay

## 2020-08-08 ENCOUNTER — Ambulatory Visit: Payer: Medicare HMO

## 2020-08-08 ENCOUNTER — Ambulatory Visit
Admission: RE | Admit: 2020-08-08 | Discharge: 2020-08-08 | Disposition: A | Discharge status: Home / Self Care | Payer: Medicare HMO | Source: Ambulatory Visit | Attending: Radiation Oncology | Admitting: Radiation Oncology

## 2020-08-08 DIAGNOSIS — C9030 Solitary plasmacytoma not having achieved remission: Secondary | ICD-10-CM | POA: Diagnosis not present

## 2020-08-08 DIAGNOSIS — Z510 Encounter for antineoplastic radiation therapy: Secondary | ICD-10-CM | POA: Diagnosis not present

## 2020-08-09 ENCOUNTER — Ambulatory Visit
Admission: RE | Admit: 2020-08-09 | Discharge: 2020-08-09 | Disposition: A | Discharge status: Home / Self Care | Payer: Medicare HMO | Source: Ambulatory Visit | Attending: Radiation Oncology | Admitting: Radiation Oncology

## 2020-08-09 ENCOUNTER — Ambulatory Visit: Payer: Medicare HMO

## 2020-08-09 DIAGNOSIS — C9030 Solitary plasmacytoma not having achieved remission: Secondary | ICD-10-CM | POA: Diagnosis not present

## 2020-08-09 DIAGNOSIS — Z510 Encounter for antineoplastic radiation therapy: Secondary | ICD-10-CM | POA: Diagnosis not present

## 2020-08-12 ENCOUNTER — Ambulatory Visit
Admission: RE | Admit: 2020-08-12 | Discharge: 2020-08-12 | Disposition: A | Discharge status: Home / Self Care | Payer: Medicare HMO | Source: Ambulatory Visit | Attending: Radiation Oncology | Admitting: Radiation Oncology

## 2020-08-12 ENCOUNTER — Other Ambulatory Visit: Payer: Self-pay

## 2020-08-12 ENCOUNTER — Ambulatory Visit: Payer: Medicare HMO

## 2020-08-12 DIAGNOSIS — C9030 Solitary plasmacytoma not having achieved remission: Secondary | ICD-10-CM | POA: Diagnosis not present

## 2020-08-12 DIAGNOSIS — Z510 Encounter for antineoplastic radiation therapy: Secondary | ICD-10-CM | POA: Diagnosis not present

## 2020-08-13 ENCOUNTER — Ambulatory Visit
Admission: RE | Admit: 2020-08-13 | Discharge: 2020-08-13 | Disposition: A | Discharge status: Home / Self Care | Payer: Medicare HMO | Source: Ambulatory Visit | Attending: Radiation Oncology | Admitting: Radiation Oncology

## 2020-08-13 DIAGNOSIS — C9030 Solitary plasmacytoma not having achieved remission: Secondary | ICD-10-CM | POA: Diagnosis not present

## 2020-08-13 DIAGNOSIS — Z510 Encounter for antineoplastic radiation therapy: Secondary | ICD-10-CM | POA: Diagnosis not present

## 2020-08-14 ENCOUNTER — Ambulatory Visit
Admission: RE | Admit: 2020-08-14 | Discharge: 2020-08-14 | Disposition: A | Discharge status: Home / Self Care | Payer: Medicare HMO | Source: Ambulatory Visit | Attending: Radiation Oncology | Admitting: Radiation Oncology

## 2020-08-14 ENCOUNTER — Other Ambulatory Visit: Payer: Self-pay

## 2020-08-14 DIAGNOSIS — C9030 Solitary plasmacytoma not having achieved remission: Secondary | ICD-10-CM | POA: Diagnosis not present

## 2020-08-14 DIAGNOSIS — Z510 Encounter for antineoplastic radiation therapy: Secondary | ICD-10-CM | POA: Diagnosis not present

## 2020-08-15 ENCOUNTER — Ambulatory Visit
Admission: RE | Admit: 2020-08-15 | Discharge: 2020-08-15 | Disposition: A | Discharge status: Home / Self Care | Payer: Medicare HMO | Source: Ambulatory Visit | Attending: Radiation Oncology | Admitting: Radiation Oncology

## 2020-08-15 DIAGNOSIS — Z510 Encounter for antineoplastic radiation therapy: Secondary | ICD-10-CM | POA: Diagnosis not present

## 2020-08-15 DIAGNOSIS — C9030 Solitary plasmacytoma not having achieved remission: Secondary | ICD-10-CM | POA: Diagnosis not present

## 2020-08-16 ENCOUNTER — Other Ambulatory Visit: Payer: Self-pay

## 2020-08-16 ENCOUNTER — Ambulatory Visit: Payer: Medicare HMO

## 2020-08-16 ENCOUNTER — Ambulatory Visit
Admission: RE | Admit: 2020-08-16 | Discharge: 2020-08-16 | Disposition: A | Discharge status: Home / Self Care | Payer: Medicare HMO | Source: Ambulatory Visit | Attending: Radiation Oncology | Admitting: Radiation Oncology

## 2020-08-16 DIAGNOSIS — Z510 Encounter for antineoplastic radiation therapy: Secondary | ICD-10-CM | POA: Diagnosis not present

## 2020-08-16 DIAGNOSIS — C9030 Solitary plasmacytoma not having achieved remission: Secondary | ICD-10-CM | POA: Diagnosis not present

## 2020-08-19 ENCOUNTER — Encounter: Payer: Self-pay | Admitting: Radiation Oncology

## 2020-08-19 ENCOUNTER — Ambulatory Visit
Admission: RE | Admit: 2020-08-19 | Discharge: 2020-08-19 | Disposition: A | Discharge status: Home / Self Care | Payer: Medicare HMO | Source: Ambulatory Visit | Attending: Radiation Oncology | Admitting: Radiation Oncology

## 2020-08-19 ENCOUNTER — Other Ambulatory Visit: Payer: Self-pay

## 2020-08-19 DIAGNOSIS — C9030 Solitary plasmacytoma not having achieved remission: Secondary | ICD-10-CM | POA: Diagnosis not present

## 2020-08-19 DIAGNOSIS — Z510 Encounter for antineoplastic radiation therapy: Secondary | ICD-10-CM | POA: Diagnosis not present

## 2020-08-26 ENCOUNTER — Other Ambulatory Visit: Payer: Self-pay | Admitting: Radiation Oncology

## 2020-08-26 DIAGNOSIS — C9030 Solitary plasmacytoma not having achieved remission: Secondary | ICD-10-CM

## 2020-08-26 DIAGNOSIS — M25511 Pain in right shoulder: Secondary | ICD-10-CM | POA: Insufficient documentation

## 2020-08-27 ENCOUNTER — Telehealth: Payer: Self-pay | Admitting: *Deleted

## 2020-08-27 NOTE — Progress Notes (Signed)
  Patient Name: Joanna Ray MRN: [id number] DOB: 1951/01/03 Referring Physician: Curt Bears (Profile Not Attached) Date of Service: 08/19/2020 [facility] [place]                                                        End Of Treatment Note  Diagnoses: C90.30-Solitary plasmacytoma not having achieved remission  Cancer Staging: Plasmacytoma involving the right humerus.  Intent: Palliative  Radiation Treatment Dates: 07/15/2020 through 08/19/2020 Site Technique Total Dose (Gy) Dose per Fx (Gy) Completed Fx Beam Energies  Humerus, Right: Ext_Rt Complex 50/50 2 25/25 6X, 10X   Narrative: The patient tolerated radiation therapy relatively well. She did have fatigue and anticipated changes of the skin in the treatment field. She also had some pain with movement and was set up to meet with PT.  Plan: The patient will receive a call in about one month from the radiation oncology department. She will continue follow up with Dr. Julien Nordmann as well.   ________________________________________________    Carola Rhine, [facility]

## 2020-08-27 NOTE — Telephone Encounter (Signed)
Left voicemail for the patient to let her know a referral has been sent for her to see PT, they will contact her with an appointment day and time.  She can use neosporin with pain relief to the area under her arm that is painful from recent radiation therapy.  Call back number [phone]) was left for further questions or concerns.  Joanna Ray. Leonie Green, BSN

## 2020-09-03 ENCOUNTER — Other Ambulatory Visit: Payer: Self-pay

## 2020-09-03 ENCOUNTER — Other Ambulatory Visit: Payer: Medicare HMO | Admitting: Internal Medicine

## 2020-09-03 DIAGNOSIS — F32A Depression, unspecified: Secondary | ICD-10-CM

## 2020-09-03 DIAGNOSIS — E7800 Pure hypercholesterolemia, unspecified: Secondary | ICD-10-CM

## 2020-09-03 DIAGNOSIS — I10 Essential (primary) hypertension: Secondary | ICD-10-CM

## 2020-09-03 DIAGNOSIS — R001 Bradycardia, unspecified: Secondary | ICD-10-CM

## 2020-09-03 DIAGNOSIS — R7989 Other specified abnormal findings of blood chemistry: Secondary | ICD-10-CM | POA: Diagnosis not present

## 2020-09-03 DIAGNOSIS — F419 Anxiety disorder, unspecified: Secondary | ICD-10-CM

## 2020-09-03 DIAGNOSIS — Z5181 Encounter for therapeutic drug level monitoring: Secondary | ICD-10-CM

## 2020-09-03 DIAGNOSIS — T783XXD Angioneurotic edema, subsequent encounter: Secondary | ICD-10-CM | POA: Diagnosis not present

## 2020-09-03 DIAGNOSIS — Z0000 Encounter for general adult medical examination without abnormal findings: Secondary | ICD-10-CM | POA: Diagnosis not present

## 2020-09-03 DIAGNOSIS — Z79899 Other long term (current) drug therapy: Secondary | ICD-10-CM | POA: Diagnosis not present

## 2020-09-04 LAB — COMPLETE METABOLIC PANEL WITH GFR
AG Ratio: 1.2 (calc) (ref 1.0–2.5)
ALT: 11 U/L (ref 6–29)
AST: 16 U/L (ref 10–35)
Albumin: 4 g/dL (ref 3.6–5.1)
Alkaline phosphatase (APISO): 48 U/L (ref 37–153)
BUN/Creatinine Ratio: 19 (calc) (ref 6–22)
BUN: 21 mg/dL (ref 7–25)
CO2: 33 mmol/L — ABNORMAL HIGH (ref 20–32)
Calcium: 9.5 mg/dL (ref 8.6–10.4)
Chloride: 104 mmol/L (ref 98–110)
Creat: 1.12 mg/dL — ABNORMAL HIGH (ref 0.50–0.99)
GFR, Est African American: 58 mL/min/{1.73_m2} — ABNORMAL LOW (ref >=60)
GFR, Est Non African American: 50 mL/min/{1.73_m2} — ABNORMAL LOW (ref >=60)
Globulin: 3.4 g/dL (calc) (ref 1.9–3.7)
Glucose, Bld: 89 mg/dL (ref 65–99)
Potassium: 4.3 mmol/L (ref 3.5–5.3)
Sodium: 142 mmol/L (ref 135–146)
Total Bilirubin: 0.4 mg/dL (ref 0.2–1.2)
Total Protein: 7.4 g/dL (ref 6.1–8.1)

## 2020-09-04 LAB — CBC WITH DIFFERENTIAL/PLATELET
Absolute Monocytes: 620 cells/uL (ref 200–950)
Basophils Absolute: 88 cells/uL (ref 0–200)
Basophils Relative: 2 %
Eosinophils Absolute: 158 cells/uL (ref 15–500)
Eosinophils Relative: 3.6 %
HCT: 37.4 % (ref 35.0–45.0)
Hemoglobin: 12 g/dL (ref 11.7–15.5)
Lymphs Abs: 1118 cells/uL (ref 850–3900)
MCH: 29.9 pg (ref 27.0–33.0)
MCHC: 32.1 g/dL (ref 32.0–36.0)
MCV: 93.3 fL (ref 80.0–100.0)
MPV: 11.4 fL (ref 7.5–12.5)
Monocytes Relative: 14.1 %
Neutro Abs: 2416 cells/uL (ref 1500–7800)
Neutrophils Relative %: 54.9 %
Platelets: 202 10*3/uL (ref 140–400)
RBC: 4.01 10*6/uL (ref 3.80–5.10)
RDW: 13.1 % (ref 11.0–15.0)
Total Lymphocyte: 25.4 %
WBC: 4.4 10*3/uL (ref 3.8–10.8)

## 2020-09-04 LAB — LIPID PANEL
Cholesterol: 186 mg/dL (ref <200)
HDL: 57 mg/dL (ref >=50)
LDL Cholesterol (Calc): 111 mg/dL (calc) — ABNORMAL HIGH
Non-HDL Cholesterol (Calc): 129 mg/dL (calc) (ref <130)
Total CHOL/HDL Ratio: 3.3 (calc) (ref <5.0)
Triglycerides: 86 mg/dL (ref <150)

## 2020-09-04 LAB — TSH: TSH: 1.56 mIU/L (ref 0.40–4.50)

## 2020-09-04 LAB — HEMOGLOBIN A1C
Hgb A1c MFr Bld: 5.7 % of total Hgb — ABNORMAL HIGH (ref <5.7)
Mean Plasma Glucose: 117 mg/dL
eAG (mmol/L): 6.5 mmol/L

## 2020-09-05 ENCOUNTER — Encounter: Payer: Self-pay | Admitting: Internal Medicine

## 2020-09-05 ENCOUNTER — Ambulatory Visit (INDEPENDENT_AMBULATORY_CARE_PROVIDER_SITE_OTHER): Payer: Medicare HMO | Admitting: Internal Medicine

## 2020-09-05 ENCOUNTER — Other Ambulatory Visit: Payer: Self-pay

## 2020-09-05 VITALS — BP 130/80 | HR 66 | Ht 65.0 in | Wt 204.0 lb

## 2020-09-05 DIAGNOSIS — C9031 Solitary plasmacytoma in remission: Secondary | ICD-10-CM

## 2020-09-05 DIAGNOSIS — Z1211 Encounter for screening for malignant neoplasm of colon: Secondary | ICD-10-CM

## 2020-09-05 DIAGNOSIS — F411 Generalized anxiety disorder: Secondary | ICD-10-CM

## 2020-09-05 DIAGNOSIS — F419 Anxiety disorder, unspecified: Secondary | ICD-10-CM | POA: Diagnosis not present

## 2020-09-05 DIAGNOSIS — Z6833 Body mass index (BMI) 33.0-33.9, adult: Secondary | ICD-10-CM | POA: Diagnosis not present

## 2020-09-05 DIAGNOSIS — E7800 Pure hypercholesterolemia, unspecified: Secondary | ICD-10-CM | POA: Diagnosis not present

## 2020-09-05 DIAGNOSIS — H6501 Acute serous otitis media, right ear: Secondary | ICD-10-CM

## 2020-09-05 DIAGNOSIS — F32A Depression, unspecified: Secondary | ICD-10-CM

## 2020-09-05 DIAGNOSIS — I10 Essential (primary) hypertension: Secondary | ICD-10-CM | POA: Diagnosis not present

## 2020-09-05 DIAGNOSIS — Z0000 Encounter for general adult medical examination without abnormal findings: Secondary | ICD-10-CM

## 2020-09-05 LAB — POCT URINALYSIS DIPSTICK
Appearance: NEGATIVE
Bilirubin, UA: NEGATIVE
Blood, UA: NEGATIVE
Glucose, UA: NEGATIVE
Ketones, UA: NEGATIVE
Leukocytes, UA: NEGATIVE
Nitrite, UA: NEGATIVE
Odor: NEGATIVE
Protein, UA: NEGATIVE
Spec Grav, UA: 1.01 (ref 1.010–1.025)
Urobilinogen, UA: 0.2 E.U./dL
pH, UA: 6.5 (ref 5.0–8.0)

## 2020-09-05 LAB — HEMOCCULT GUIAC POC 1CARD (OFFICE)
Card #1 Date: 5122022
Fecal Occult Blood, POC: NEGATIVE

## 2020-09-05 MED ORDER — AZITHROMYCIN 250 MG PO TABS: ORAL_TABLET | ORAL | 0 refills | Status: AC

## 2020-09-05 NOTE — Progress Notes (Signed)
Subjective:    Patient ID: Joanna Ray, female    DOB: December 06, 1950, 70 y.o.   MRN: [id number]  HPI 70 year old F seen for Medicare wellness and health maintenance exam.  She has a history of hypertension, anxiety and depression as well as hyperlipidemia.    She was seen here in May 17 2020 regarding antihypertensive medication management and elevated serum creatinine of 1.29 noted in November 2021.  Amlodipine had been tried but it caused edema.  She used to take lisinopril and allergist thought it had perhaps called angioedema.  We tried Coreg but that did not work well for her.  We subsequently went back to lisinopril HCTZ in January 2022.  And this seems to have worked well for her.  She was here in late October with an acute lower respiratory infection.  She did mention some right arm and shoulder pain.  Was thought to have perhaps an impingement syndrome.  This was mentioned at the end of that visit.  Recommended orthopedic evaluation.  She saw Dr. Junius Roads and had a subacromial injection.  Subsequently had physical therapy.  Symptoms persisted and in early February she had MRI of right shoulder showing moderate rotator cuff tendinopathy.  Ganglion cyst was noted inferior to the supraspinatus muscle.  The humerus had a 5.3 sonometer expansile lesion with some edema surrounding the lesion.  It was subsequently biopsied in early March and was found to be a plasmacytoma.  Was referred to Dr. Earlie Server.  Had work-up for multiple myeloma.  Had kappa light chains present on myeloma testing.  She has received radiation therapy to the right humerus plasmacytoma.  She had 6% plasma cells in bone marrow biopsy and aspirate.  Findings are not enough to call this multiple myeloma at the present time.  She will have repeat myeloma panel in 3 months.  She presented to the emergency department with chest pain in March 2021.  Was evaluated by Dr. Terri Skains and Dr. Einar Gip.  Did not have MI.  Underwent  echocardiogram and stress test.  Echocardiogram showed mild aortic regurgitation.  Myocardial perfusion nuclear stress test was normal.  History of anxiety and depression treated with Lexapro.  History of allergic rhinitis.  History of urticaria treated by Dr. Nelva Bush.  Anxiety treated with Xanax.  Social history: Husband is retired and has congestive heart failure.  Patient formally smoked but has not smoked for over 20 years.  She has adult children.  She teaches at [employer].  No alcohol consumption.  Family history: Father died at age 65 of a stroke.  Mother died at age 70 with history of stroke in 2010.  1 brother with history of hypertension and MI.  1 sister with history of diabetes.  1 sister age 70 with hypertension and another sister age 70 with hypertension.  Son with history of hypertension.  She lost her son in the summer 2019 and an auto accident.  No history of operations or accidents.  No known drug allergies.  Hyperlipidemia and essential hypertension well-controlled.   Review of Systems see above     Objective:   Physical Exam Blood pressure 130/80 pulse 66 pulse oximetry 98% weight 204 pounds BMI 33.95  Skin: Warm and dry.  No cervical adenopathy or thyromegaly.  No carotid bruits.  Chest clear to auscultation.  Cardiac exam: Regular rate and rhythm without murmurs.  Breast without masses.  Abdomen soft nondistended without hepatosplenomegaly masses or tenderness.  Bimanual exam is normal.  Pap deferred  due to age.  No lower extremity pitting edema.  Neuro is intact without focal deficits.  Affect thought and judgment are normal.       Assessment & Plan:  History of right humerus plasmacytoma.  Being monitored for early myeloma.  Dr. Earlie Server is her oncologist.  Received radiation therapy to humerus plasmacytoma.  History of anxiety treated with Xanax as well as Lexapro.  Remote history of benign positional vertigo  Hyperlipidemia  treated with statin medication  Essential hypertension-stable  Mammogram recommended  Colonoscopy is up-to-date.  Stool is guaiac negative.  Recommend COVID booster.  Has had 2 pneumococcal vaccines and tetanus immunization is up-to-date.  Had flu vaccine November 2021.  Complaining of right ear popping.  Her right TM is slightly full but not red.  I have prescribed Zithromax Z-PAK.  Subjective:   Patient presents for Medicare Annual/Subsequent preventive examination.  Review Past Medical/Family/Social: See above   Risk Factors  Current exercise habits: Light Dietary issues discussed: Yes  Cardiac risk factors: Hypertension, hyperlipidemia  Depression Screen  (Note: if answer to either of the following is "Yes", a more complete depression screening is indicated)   Over the past two weeks, have you felt down, depressed or hopeless? No  Over the past two weeks, have you felt little interest or pleasure in doing things? No Have you lost interest or pleasure in daily life? No Do you often feel hopeless? No Do you cry easily over simple problems? No   Activities of Daily Living  In your present state of health, do you have any difficulty performing the following activities?:   Driving? No  Managing money? No  Feeding yourself? No  Getting from bed to chair? No  Climbing a flight of stairs? No  Preparing food and eating?: No  Bathing or showering? No  Getting dressed: No  Getting to the toilet? No  Using the toilet:No  Moving around from place to place: No  In the past year have you fallen or had a near fall?:No  Are you sexually active?  Yes Do you have more than one partner? No   Hearing Difficulties: No  Do you often ask people to speak up or repeat themselves? No  Do you experience ringing or noises in your ears? No  Do you have difficulty understanding soft or whispered voices? No  Do you feel that you have a problem with memory? No Do you often misplace items?   Sometimes   Home Safety:  Do you have a smoke alarm at your residence? Yes Do you have grab bars in the bathroom?  No Do you have throw rugs in your house?  None   Cognitive Testing  Alert? Yes Normal Appearance?Yes  Oriented to person? Yes Place? Yes  Time? Yes  Recall of three objects? Yes  Can perform simple calculations? Yes  Displays appropriate judgment?Yes  Can read the correct time from a watch face?Yes   List the Names of Other Physician/Practitioners you currently use:  See referral list for the physicians patient is currently seeing.  Dr. Earlie Server   Review of Systems: See above   Objective:     General appearance: Appears younger than stated age Head: Normocephalic, without obvious abnormality, atraumatic  Eyes: conj clear, EOMi PEERLA  Ears: Right TM is full but not red.  Left TM normal. Nose: Nares normal. Septum midline. Mucosa normal. No drainage or sinus tenderness.  Throat: lips, mucosa, and tongue normal; teeth and gums normal  Neck: no adenopathy,  no carotid bruit, no JVD, supple, symmetrical, trachea midline and thyroid not enlarged, symmetric, no tenderness/mass/nodules  No CVA tenderness.  Lungs: clear to auscultation bilaterally  Breasts: normal appearance, no masses or tenderness Heart: regular rate and rhythm, S1, S2 normal, no murmur, click, rub or gallop  Abdomen: soft, non-tender; bowel sounds normal; no masses, no organomegaly  Musculoskeletal: ROM normal in all joints, no crepitus, no deformity, Normal muscle strengthen. Back  is symmetric, no curvature. Skin: Skin color, texture, turgor normal. No rashes or lesions  Lymph nodes: Cervical, supraclavicular, and axillary nodes normal.  Neurologic: CN 2 -12 Normal, Normal symmetric reflexes. Normal coordination and gait  Psych: Alert & Oriented x 3, Mood appear stable.    Assessment:    Annual wellness medicare exam   Plan:    During the course of the visit the patient was educated and  counseled about appropriate screening and preventive services including:   Colonoscopy up-to-date  Mammogram is due  COVID booster is due     Patient Instructions (the written plan) was given to the patient.  Medicare Attestation  I have personally reviewed:  The patient's medical and social history  Their use of alcohol, tobacco or illicit drugs  Their current medications and supplements  The patient's functional ability including ADLs,fall risks, home safety risks, cognitive, and hearing and visual impairment  Diet and physical activities  Evidence for depression or mood disorders  The patient's weight, height, BMI, and visual acuity have been recorded in the chart. I have made referrals, counseling, and provided education to the patient based on review of the above and I have provided the patient with a written personalized care plan for preventive services.       Plan: Follow-up here in 6 months or as needed.  Continue close follow-up with Dr. Earlie Server.

## 2020-09-05 NOTE — Progress Notes (Deleted)
   Subjective:    Patient ID: Joanna Ray, female    DOB: August 08, 1950, 70 y.o.   MRN: [id number]  HPI  70 year old     Review of Systems solitary plasmacytoma      Objective:   Physical Exam BP 130/80, pulse 66, regular       Assessment & Plan:

## 2020-09-05 NOTE — Patient Instructions (Addendum)
Zithromax has been sent in for right ear problem. Continue same meds and RTC in 6 months. Have Covid booster.  Have mammogram.

## 2020-09-10 ENCOUNTER — Other Ambulatory Visit: Payer: Self-pay | Admitting: Radiation Oncology

## 2020-09-10 DIAGNOSIS — M25511 Pain in right shoulder: Secondary | ICD-10-CM

## 2020-09-10 DIAGNOSIS — C9030 Solitary plasmacytoma not having achieved remission: Secondary | ICD-10-CM

## 2020-09-16 ENCOUNTER — Ambulatory Visit
Admission: RE | Admit: 2020-09-16 | Discharge: 2020-09-16 | Disposition: A | Discharge status: Home / Self Care | Payer: Medicare HMO | Source: Ambulatory Visit | Attending: Internal Medicine | Admitting: Internal Medicine

## 2020-09-16 DIAGNOSIS — C9030 Solitary plasmacytoma not having achieved remission: Secondary | ICD-10-CM

## 2020-09-18 ENCOUNTER — Ambulatory Visit: Payer: Medicare HMO | Attending: Radiation Oncology | Admitting: Physical Therapy

## 2020-09-18 ENCOUNTER — Encounter: Payer: Self-pay | Admitting: Physical Therapy

## 2020-09-18 ENCOUNTER — Other Ambulatory Visit: Payer: Self-pay | Admitting: Internal Medicine

## 2020-09-18 ENCOUNTER — Other Ambulatory Visit: Payer: Self-pay

## 2020-09-18 DIAGNOSIS — M6281 Muscle weakness (generalized): Secondary | ICD-10-CM | POA: Diagnosis not present

## 2020-09-18 DIAGNOSIS — M25511 Pain in right shoulder: Secondary | ICD-10-CM | POA: Diagnosis not present

## 2020-09-18 NOTE — Patient Instructions (Signed)
Access Code: [email or web address] URL: [email or web address] Date: 09/18/2020 Prepared by: Venetia Night Xiadani Damman  Exercises Supine Shoulder Flexion Extension AAROM with Dowel - 1 x daily - 7 x weekly - 2 sets - 10 reps Supine Shoulder External Rotation with Dowel at 20 Degrees of Abduction - 1 x daily - 7 x weekly - 2 sets - 10 reps Supine Shoulder Scaption with Dowel - 1 x daily - 7 x weekly - 2 sets - 10 reps Seated Shoulder Flexion Towel Slide at Table Top - 1 x daily - 7 x weekly - 2 sets - 10 reps Seated Shoulder Scaption Slide at Table Top with Forearm in Neutral - 1 x daily - 7 x weekly - 2 sets - 10 reps Seated Shoulder Flexion AAROM with Pulley Behind - 1 x daily - 7 x weekly - 1 sets - 30 reps

## 2020-09-18 NOTE — Therapy (Signed)
[facility] [address] [place], [place], [address] Phone: [phone]   Fax:  [phone]  Physical Therapy Evaluation  Patient Details  Name: Joanna Ray MRN: [id number] Date of Birth: 1950/06/04 Referring Provider (PT): Hayden Pedro, [place]   Encounter Date: 09/18/2020   PT End of Session - 09/18/20 0757    Visit Number 1    Date for PT Re-Evaluation 11/13/20    Authorization Type [employer] - requesting 12 visits    Authorization - Visit Number 1    Progress Note Due on Visit 10    PT Start Time 0800    PT Stop Time 0845    PT Time Calculation (min) 45 min    Activity Tolerance Patient tolerated treatment well;Patient limited by pain    Behavior During Therapy [facility] for tasks assessed/performed           Past Medical History:  Diagnosis Date  . Anxiety   . GERD (gastroesophageal reflux disease)   . Hyperlipidemia   . Hypertension   . Urticaria     Past Surgical History:  Procedure Laterality Date  . CATARACT EXTRACTION, BILATERAL Bilateral   . DILATION AND CURETTAGE OF UTERUS      There were no vitals filed for this visit.    Subjective Assessment - 09/18/20 0759    Subjective Pt referred to OPPT with Rt shoulder pain related to metastatic cancer and radiation treatment to Rt humerus.  Cancer diagnosed in Feb 2022.  Final radiation treatment was 08/19/20 without remission.  Pt is right hand dominant.  She is having pain and limited ROM with use of Rt UE.    Pertinent History final radiation treatment to Rt humerus 08/19/20    Limitations Lifting;Writing;Other (comment);House hold activities   typing, dressing, ADLs   Diagnostic tests MRI Rt shoulder 05/2020    Patient Stated Goals wash my hair, roll my hair at night    Currently in Pain? Yes    Pain Score 5    with use   Pain Location Shoulder    Pain Orientation Right;Upper;Lateral;Posterior;Anterior    Pain Descriptors /  Indicators Sharp    Pain Type Acute pain    Pain Radiating Towards maybe into shoulder blade    Pain Onset More than a month ago    Pain Frequency Intermittent    Aggravating Factors  with use for ADLs and household activities    Pain Relieving Factors being still    Effect of Pain on Daily Activities ADLs, hair wash/style              [facility] PT Assessment - 09/18/20 0001      Assessment   Medical Diagnosis C90.30 (ICD-10-CM) - Solitary plasmacytoma not having achieved remission (HCC)  M25.511 (ICD-10-CM) - Pain in right shoulder    Referring Provider (PT) Hayden Pedro, PA-C    Onset Date/Surgical Date --   01/2020   Hand Dominance Right    Next MD Visit 09/2020    Prior Therapy yes before knew diagnosis, thought bursitis      Precautions   Precautions Other (comment)    Precaution Comments metastatic cancer to Rt humerus: no heat, ice, [place], weight bearing through Rt UE      Restrictions   Weight Bearing Restrictions Yes    RUE Weight Bearing Non weight bearing      Balance Screen   Has the patient fallen in the past 6 months No  Home Environment   Living Environment Private residence    Living Arrangements Spouse/significant other;Children    Type of [place] Access Level entry    Home Layout One level      Prior Function   Level of Independence Needs assistance with ADLs    Vocation Full time employment    Vocation Requirements teaches, some typing, write on board    Leisure travel      Cognition   Overall Cognitive Status Within Functional Limits for tasks assessed      Observation/Other Assessments   Skin Integrity darkened skin along posterior and anterior Rt shoulder from recent radiation, areas of skin breakdown present in axilla, Pt using neosporin on these and they are healing    Focus on Therapeutic Outcomes (FOTO)  32% goal 59%      Posture/Postural Control   Posture/Postural Control Postural limitations    Posture Comments  elevated Rt shoulder girdle      ROM / Strength   AROM / PROM / Strength AROM;PROM;Strength      AROM   Overall AROM Comments Lt shoulder WNL    AROM Assessment Site Cervical;Shoulder    Right/Left Shoulder Right    Right Shoulder Extension 50 Degrees    Right Shoulder Flexion 50 Degrees    Right Shoulder ABduction 75 Degrees    Right Shoulder Internal Rotation --   lower gluteal fold   Right Shoulder External Rotation --   reaches to top of head   Right Shoulder Horizontal  ADduction --   limited with pain, turns head to reach for Lt side of head   Cervical Flexion 65    Cervical Extension 55    Cervical - Right Side Bend 35    Cervical - Left Side Bend 45    Cervical - Right Rotation 80    Cervical - Left Rotation 70      PROM   PROM Assessment Site Shoulder    Right/Left Shoulder Right    Right Shoulder Flexion 160 Degrees   pain barrier     Strength   Overall Strength Comments Lt shoulder WNL      Palpation   Palpation comment slight tenderness with mild TP Rt upper trap, bicep and RC tendons non-tender                      Objective measurements completed on examination: See above findings.       [facility] Adult PT Treatment/Exercise - 09/18/20 0001      Exercises   Exercises Shoulder      Shoulder Exercises: Supine   External Rotation AAROM;Right;10 reps    External Rotation Limitations arc of available motion for ER/IR, towel under elbow, with dowel, x 10    Flexion AAROM;Both;10 reps    Flexion Limitations with dowel    Diagonals Limitations scaption AA/ROM Rt UE with dowel x 10 reps      Shoulder Exercises: Seated   Other Seated Exercises table slides Rt shoulder flexion and scaption      Shoulder Exercises: Pulleys   Flexion 1 minute                  PT Education - 09/18/20 0846    Education Details Access Code: [email or web address]    Person(s) Educated Patient    Methods Explanation;Handout;Demonstration    Comprehension Verbalized  understanding;Returned demonstration            PT Short Term  Goals - 09/18/20 1041      PT SHORT TERM GOAL #1   Title Pt will be ind and compliant with initial HEP    Time 3    Period Weeks    Status New    Target Date 10/09/20      PT SHORT TERM GOAL #2   Title Improve FOTO by at least 10 points to 42%    Time 4    Period Weeks    Status New    Target Date 10/16/20      PT SHORT TERM GOAL #3   Title improve Rt shoulder strength to at least 4-/5    Time 4    Period Weeks    Status New    Target Date 10/16/20      PT SHORT TERM GOAL #4   Title Pt will report greater ease of use of Rt UE for ADLs including dressing, hair washing and styling hair on Rt side by at least 30%    Time 4    Period Weeks    Status New    Target Date 10/16/20             PT Long Term Goals - 09/18/20 1044      PT LONG TERM GOAL #1   Title Pt will be ind with advanced HEP and understand how to safely progress    Time 8    Period Weeks    Status New    Target Date 11/13/20      PT LONG TERM GOAL #2   Title Pt will improve Rt shoulder strength to at least 4+ all planes    Time 8    Period Weeks    Status New    Target Date 11/13/20      PT LONG TERM GOAL #3   Title Pt will be able to reach with Rt UE to shelf at shoulder height or greater with min or no pain with proper mechanics.    Time 8    Period Weeks    Status New    Target Date 11/13/20      PT LONG TERM GOAL #4   Title Pt will report improved pain and use of Rt UE with all ADLs and work tasks by at least 70%    Time 8    Period Weeks    Status New    Target Date 11/13/20      PT LONG TERM GOAL #5   Title Pt will be able to wash and style her hair with use of dominant Rt UE with min compensation and min or no pain.    Time 8    Period Weeks    Status New    Target Date 11/13/20                  Plan - 09/18/20 1025    Clinical Impression Statement Pt is a very pleasant Rt-hand dominant 70yo female  with recent diagnosis (05/2020) of Rt humerus plasmacytoma for which she just completed radiation in late April.  She presents with limited A/ROM and weakness in Rt shoulder.  She has limitations with ADLs, reaching, lifting and sleep secondary to inability to [employer] on Rt side.  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  She has some skin breakdown in Rt axilla but this is healing well with use of neosporin.  She has mild limitations  in cervical ROM for SB and Rot.  Lt shoulder ROM and strength are WNL.  Rt shoulder A/ROM for elevation is 50 deg with pain; P/ROM is 160 deg.  IR is to gluteal fold with pain.  She has trouble reaching to wash her hair and to style opposite side of hair.  Strength of Rt shoulder is grossly 3+/5 with pain.  Scapular mobility is limited secondary to muscle tightness from compensation.  She has tightness and mild tenderness in Rt pectorals.  PT initiated HEP for AA/ROM in supine, sitting and with pulleys today and gave info on where to purchase pulleys should she wish to get some.  Pt will benefit from skilled PT to improve strength, ROM and functional use of dominant Rt UE.    Personal Factors and Comorbidities Comorbidity 1;Profession    Comorbidities Rt humerus cancer, just finished radiation    Examination-Activity Limitations Bathing;Reach Overhead;Self Feeding;Carry;Sleep;Dressing;Lift;Hygiene/Grooming    Examination-Participation Restrictions Cleaning;Meal Prep;Occupation;Community Activity;Shop;Driving;Laundry;Yard Work    Stability/Clinical Decision Making Stable/Uncomplicated    Designer, jewellery Low    Rehab Potential Good    PT Frequency 2x / week   Pt started with 1x/week due to preference for schedule   PT Duration 8 weeks    PT Treatment/Interventions ADLs/Self Care Home Management;Electrical Stimulation;Neuromuscular re-education;Manual techniques;Passive range of motion;Functional mobility  training;Therapeutic activities;Therapeutic exercise;Joint Manipulations    PT Next Visit Plan Rt shoulder mobility: P/ROM, AA/ROM, pulleys, STM pectorals and upper trap, begin light GH and scapular strength arm at side    PT Home Exercise Plan Access Code: [email or web address]    Consulted and Agree with Plan of Care Patient           Patient will benefit from skilled therapeutic intervention in order to improve the following deficits and impairments:  Postural dysfunction,Decreased strength,Pain,Decreased mobility,Impaired UE functional use,Impaired flexibility,Improper body mechanics,Increased fascial restricitons,Decreased range of motion  Visit Diagnosis: Right shoulder pain, unspecified chronicity - Plan: PT plan of care cert/re-cert  Muscle weakness (generalized) - Plan: PT plan of care cert/re-cert     Problem List Patient Active Problem List   Diagnosis Date Noted  . Right shoulder pain 08/26/2020  . Plasmacytoma ([place]) 06/28/2020  . Angioedema 01/21/2019  . Vertigo 09/28/2016  . Hyperlipidemia 09/25/2011  . Hypertension     Baruch Merl, PT 09/18/20 10:52 AM   [facility] [address], [place], [place], [address] Phone: [phone]   Fax:  [phone]  Name: KATHERINE TOUT MRN: [id number] Date of Birth: 1951-03-01

## 2020-09-25 ENCOUNTER — Inpatient Hospital Stay: Payer: Medicare HMO | Attending: Internal Medicine

## 2020-09-25 ENCOUNTER — Other Ambulatory Visit: Payer: Self-pay

## 2020-09-25 DIAGNOSIS — I10 Essential (primary) hypertension: Secondary | ICD-10-CM | POA: Insufficient documentation

## 2020-09-25 DIAGNOSIS — C9000 Multiple myeloma not having achieved remission: Secondary | ICD-10-CM | POA: Insufficient documentation

## 2020-09-25 DIAGNOSIS — K219 Gastro-esophageal reflux disease without esophagitis: Secondary | ICD-10-CM | POA: Diagnosis not present

## 2020-09-25 DIAGNOSIS — E785 Hyperlipidemia, unspecified: Secondary | ICD-10-CM | POA: Diagnosis not present

## 2020-09-25 DIAGNOSIS — Z79899 Other long term (current) drug therapy: Secondary | ICD-10-CM | POA: Insufficient documentation

## 2020-09-25 DIAGNOSIS — Z7982 Long term (current) use of aspirin: Secondary | ICD-10-CM | POA: Diagnosis not present

## 2020-09-25 LAB — CMP (CANCER CENTER ONLY)
ALT: 13 U/L (ref 0–44)
AST: 16 U/L (ref 15–41)
Albumin: 4 g/dL (ref 3.5–5.0)
Alkaline Phosphatase: 45 U/L (ref 38–126)
Anion gap: 12 (ref 5–15)
BUN: 19 mg/dL (ref 8–23)
CO2: 28 mmol/L (ref 22–32)
Calcium: 9.5 mg/dL (ref 8.9–10.3)
Chloride: 105 mmol/L (ref 98–111)
Creatinine: 0.99 mg/dL (ref 0.44–1.00)
GFR, Estimated: >60 mL/min (ref >60)
Glucose, Bld: 94 mg/dL (ref 70–99)
Potassium: 3.9 mmol/L (ref 3.5–5.1)
Sodium: 145 mmol/L (ref 135–145)
Total Bilirubin: 0.6 mg/dL (ref 0.3–1.2)
Total Protein: 8.2 g/dL — ABNORMAL HIGH (ref 6.5–8.1)

## 2020-09-25 LAB — CBC WITH DIFFERENTIAL (CANCER CENTER ONLY)
Abs Immature Granulocytes: 0.05 10*3/uL (ref 0.00–0.07)
Basophils Absolute: 0.1 10*3/uL (ref 0.0–0.1)
Basophils Relative: 2 %
Eosinophils Absolute: 0.1 10*3/uL (ref 0.0–0.5)
Eosinophils Relative: 1 %
HCT: 36.6 % (ref 36.0–46.0)
Hemoglobin: 12 g/dL (ref 12.0–15.0)
Immature Granulocytes: 1 %
Lymphocytes Relative: 30 %
Lymphs Abs: 1.4 10*3/uL (ref 0.7–4.0)
MCH: 30.6 pg (ref 26.0–34.0)
MCHC: 32.8 g/dL (ref 30.0–36.0)
MCV: 93.4 fL (ref 80.0–100.0)
Monocytes Absolute: 0.6 10*3/uL (ref 0.1–1.0)
Monocytes Relative: 13 %
Neutro Abs: 2.5 10*3/uL (ref 1.7–7.7)
Neutrophils Relative %: 53 %
Platelet Count: 196 10*3/uL (ref 150–400)
RBC: 3.92 MIL/uL (ref 3.87–5.11)
RDW: 13.3 % (ref 11.5–15.5)
WBC Count: 4.6 10*3/uL (ref 4.0–10.5)
nRBC: 0 % (ref 0.0–0.2)

## 2020-09-25 LAB — LACTATE DEHYDROGENASE: LDH: 192 U/L (ref 98–192)

## 2020-09-25 NOTE — Progress Notes (Signed)
  Radiation Oncology         [phone] ________________________________  Name: Joanna Ray MRN: [id number]  Date of Service: 09/16/2020  DOB: [birth year]  Post Treatment Telephone Note  Diagnosis:   Plasmacytoma involving the right humerus.  Interval Since Last Radiation:  6 weeks   07/15/2020 through 08/19/2020 Site Technique Total Dose (Gy) Dose per Fx (Gy) Completed Fx Beam Energies  Humerus, Right: Ext_Rt Complex 50/50 2 25/25 6X, 10X     Narrative:  The patient was contacted today for routine follow-up. During treatment she did very well with radiotherapy and her skin did develop delayed skin irritation which is improving. She reports she is doing well with PT and sees the team on Friday.  Impression/Plan: 1. Plasmacytoma involving the right humerus. The patient has been doing well since completion of radiotherapy. We discussed that we would be happy to continue to follow her as needed, but she will also continue to follow up with Dr. Julien Nordmann in medical oncology.      Carola Rhine, PAC

## 2020-09-26 LAB — IGG, IGA, IGM
IgA: 108 mg/dL (ref 87–352)
IgG (Immunoglobin G), Serum: 2145 mg/dL — ABNORMAL HIGH (ref 586–1602)
IgM (Immunoglobulin M), Srm: 54 mg/dL (ref 26–217)

## 2020-09-26 LAB — KAPPA/LAMBDA LIGHT CHAINS
Kappa free light chain: 90.8 mg/L — ABNORMAL HIGH (ref 3.3–19.4)
Kappa, lambda light chain ratio: 9.66 — ABNORMAL HIGH (ref 0.26–1.65)
Lambda free light chains: 9.4 mg/L (ref 5.7–26.3)

## 2020-09-27 ENCOUNTER — Ambulatory Visit: Payer: Medicare HMO | Attending: Radiation Oncology | Admitting: Physical Therapy

## 2020-09-27 ENCOUNTER — Other Ambulatory Visit: Payer: Self-pay

## 2020-09-27 DIAGNOSIS — M25511 Pain in right shoulder: Secondary | ICD-10-CM | POA: Insufficient documentation

## 2020-09-27 DIAGNOSIS — M6281 Muscle weakness (generalized): Secondary | ICD-10-CM | POA: Insufficient documentation

## 2020-09-27 DIAGNOSIS — R600 Localized edema: Secondary | ICD-10-CM | POA: Insufficient documentation

## 2020-09-27 LAB — BETA 2 MICROGLOBULIN, SERUM: Beta-2 Microglobulin: 2 mg/L (ref 0.6–2.4)

## 2020-09-27 NOTE — Therapy (Signed)
[facility] [address], [place], [place], [address] Phone: [phone]   Fax:  [phone]  Physical Therapy Treatment  Patient Details  Name: Joanna Ray MRN: [id number] Date of Birth: March 08, 1951 Referring Provider (PT): Hayden Pedro, [place]   Encounter Date: 09/27/2020   PT End of Session - 09/27/20 0924    Visit Number 2    Date for PT Re-Evaluation 11/13/20    Authorization Type [employer] - requesting 12 visits    Authorization Time Period [employer] approved 12 visits, 09/18/2020-12/11/2020    Authorization - Visit Number 2    Authorization - Number of Visits 12    Progress Note Due on Visit 10    PT Start Time 0805    PT Stop Time 0848    PT Time Calculation (min) 43 min    Activity Tolerance Patient tolerated treatment well;Patient limited by pain    Behavior During Therapy [facility] for tasks assessed/performed           Past Medical History:  Diagnosis Date  . Anxiety   . GERD (gastroesophageal reflux disease)   . Hyperlipidemia   . Hypertension   . Urticaria     Past Surgical History:  Procedure Laterality Date  . CATARACT EXTRACTION, BILATERAL Bilateral   . DILATION AND CURETTAGE OF UTERUS      There were no vitals filed for this visit.   Subjective Assessment - 09/27/20 0807    Subjective Has increased pian today. Is unsure why.    Pertinent History final radiation treatment to Rt humerus 08/19/20    Limitations Lifting;Writing;Other (comment);House hold activities    Diagnostic tests MRI Rt shoulder 05/2020    Patient Stated Goals wash my hair, roll my hair at night    Currently in Pain? Yes    Pain Score 6     Pain Location Shoulder    Pain Orientation Right    Pain Descriptors / Indicators Patsi Sears Adult PT Treatment/Exercise - 09/27/20 0001      Shoulder Exercises: Supine   External Rotation AAROM;Right;10 reps     Flexion AAROM;Both;10 reps    Flexion Limitations with dowel    Other Supine Exercises dowl chest press x10 repetitions    Other Supine Exercises AAROM flexion   therapist assist through partial ROM and patient completing final AROM through available range x 10 repetitions     Shoulder Exercises: Seated   Retraction AROM;Both;10 reps    Flexion AAROM;Right;15 reps    Flexion Weight (lbs) UE ranger    Other Seated Exercises scaption; UE range; x15 reps      Shoulder Exercises: Standing   Flexion AAROM;Both;10 reps    Shoulder Flexion Weight (lbs) large blue ball roll at mat table      Shoulder Exercises: Isometric Strengthening   Flexion Limitations 10 x 3s    Extension Limitations elbow bent; 10 x 3s   VC to prevent extension past neutral   Internal Rotation Limitations belly push; 10 x 3s      Manual Therapy   Manual Therapy Passive ROM    Passive ROM flexion within tolerable range                  PT Education - 09/27/20 0919    Education Details scap squeezes; flexion  isometric; abduction isometric    Person(s) Educated Patient    Methods Explanation;Demonstration;Tactile cues;Verbal cues;Handout    Comprehension Verbalized understanding;Returned demonstration;Verbal cues required;Tactile cues required            PT Short Term Goals - 09/27/20 0923      PT SHORT TERM GOAL #1   Title Pt will be ind and compliant with initial HEP    Time 3    Period Weeks    Status On-going    Target Date 10/09/20             PT Long Term Goals - 09/18/20 1044      PT LONG TERM GOAL #1   Title Pt will be ind with advanced HEP and understand how to safely progress    Time 8    Period Weeks    Status New    Target Date 11/13/20      PT LONG TERM GOAL #2   Title Pt will improve Rt shoulder strength to at least 4+ all planes    Time 8    Period Weeks    Status New    Target Date 11/13/20      PT LONG TERM GOAL #3   Title Pt will be able to reach with Rt UE  to shelf at shoulder height or greater with min or no pain with proper mechanics.    Time 8    Period Weeks    Status New    Target Date 11/13/20      PT LONG TERM GOAL #4   Title Pt will report improved pain and use of Rt UE with all ADLs and work tasks by at least 70%    Time 8    Period Weeks    Status New    Target Date 11/13/20      PT LONG TERM GOAL #5   Title Pt will be able to wash and style her hair with use of dominant Rt UE with min compensation and min or no pain.    Time 8    Period Weeks    Status New    Target Date 11/13/20                 Plan - 09/27/20 0920    Clinical Impression Statement Patient reporting significant pain with flexion using UE ranger. Reporting good tolerance of scaption. Initially reporting increased pain with passive flexion however, reporting decreased pain with education for decreased muscle guarding. Heavy education regarding using submaximal force for isometric exercises. Patient verbalizing understanding. Would benefit from continued skilled intervention to address impairments for improved functional using of Rt UE.    Personal Factors and Comorbidities Comorbidity 1;Profession    Comorbidities Rt humerus cancer, just finished radiation    Examination-Activity Limitations Bathing;Reach Overhead;Self Feeding;Carry;Sleep;Dressing;Lift;Hygiene/Grooming    Examination-Participation Restrictions Cleaning;Meal Prep;Occupation;Community Activity;Shop;Driving;Laundry;Yard Work    Rehab Potential Good    PT Frequency 2x / week    PT Duration 8 weeks    PT Treatment/Interventions ADLs/Self Care Home Management;Electrical Stimulation;Neuromuscular re-education;Manual techniques;Passive range of motion;Functional mobility training;Therapeutic activities;Therapeutic exercise;Joint Manipulations    PT Next Visit Plan continue with Rt shoulder mobility: P/ROM, AA/ROM, pulleys, STM pectorals and upper trap, begin light GH and scapular strength arm at  side    PT Home Exercise Plan Access Code: [email or web address]    Consulted and Agree with Plan of Care Patient           Patient will benefit from skilled therapeutic  intervention in order to improve the following deficits and impairments:  Postural dysfunction,Decreased strength,Pain,Decreased mobility,Impaired UE functional use,Impaired flexibility,Improper body mechanics,Increased fascial restricitons,Decreased range of motion  Visit Diagnosis: Right shoulder pain, unspecified chronicity  Muscle weakness (generalized)     Problem List Patient Active Problem List   Diagnosis Date Noted  . Right shoulder pain 08/26/2020  . Plasmacytoma ([place]) 06/28/2020  . Angioedema 01/21/2019  . Vertigo 09/28/2016  . Hyperlipidemia 09/25/2011  . Hypertension     Everardo All PT, DPT  09/27/20 9:26 AM   [facility] [address], [place], [place], [address] Phone: [phone]   Fax:  [phone]  Name: JASMA SEEVERS MRN: [id number] Date of Birth: [birth year]

## 2020-09-27 NOTE — Patient Instructions (Signed)
Seated Scapular Retraction - 1 x daily - 7 x weekly - 2 sets - 10 reps - 3s hold Isometric Shoulder Abduction - Arm Straight at Wall - 1 x daily - 7 x weekly - 2 sets - 10 reps - 3s hold Seated Isometric Shoulder Flexion at Counter - 1 x daily - 7 x weekly - 2 sets - 10 reps - 3s hold

## 2020-10-01 ENCOUNTER — Other Ambulatory Visit: Payer: Self-pay

## 2020-10-01 ENCOUNTER — Ambulatory Visit: Payer: Medicare HMO | Admitting: Physical Therapy

## 2020-10-01 ENCOUNTER — Encounter: Payer: Self-pay | Admitting: Physical Therapy

## 2020-10-01 DIAGNOSIS — M25511 Pain in right shoulder: Secondary | ICD-10-CM | POA: Diagnosis not present

## 2020-10-01 DIAGNOSIS — M6281 Muscle weakness (generalized): Secondary | ICD-10-CM | POA: Diagnosis not present

## 2020-10-01 DIAGNOSIS — R600 Localized edema: Secondary | ICD-10-CM | POA: Diagnosis not present

## 2020-10-01 NOTE — Therapy (Signed)
[facility] [address], [place], [place], [address] Phone: [phone]   Fax:  [phone]  Physical Therapy Treatment  Patient Details  Name: Joanna Ray MRN: [id number] Date of Birth: [birth year] Referring Provider (PT): Hayden Pedro, [place]   Encounter Date: 10/01/2020   PT End of Session - 10/01/20 0759    Visit Number 3    Date for PT Re-Evaluation 11/13/20    Authorization Type [employer] - requesting 12 visits    Authorization Time Period [employer] approved 12 visits, 09/18/2020-12/11/2020    Authorization - Visit Number 3    Authorization - Number of Visits 12    Progress Note Due on Visit 10    PT Start Time 0800    PT Stop Time 0842    PT Time Calculation (min) 42 min    Activity Tolerance Patient tolerated treatment well;Patient limited by pain    Behavior During Therapy [facility] for tasks assessed/performed           Past Medical History:  Diagnosis Date  . Anxiety   . GERD (gastroesophageal reflux disease)   . Hyperlipidemia   . Hypertension   . Urticaria     Past Surgical History:  Procedure Laterality Date  . CATARACT EXTRACTION, BILATERAL Bilateral   . DILATION AND CURETTAGE OF UTERUS      There were no vitals filed for this visit.   Subjective Assessment - 10/01/20 0801    Subjective My pain has gone up a bit since starting PT.  I have needed OTC meds.  "My shoulder is fighting me."    Pertinent History final radiation treatment to Rt humerus 08/19/20    Limitations Lifting;Writing;Other (comment);House hold activities    Diagnostic tests MRI Rt shoulder 05/2020    Patient Stated Goals wash my hair, roll my hair at night    Currently in Pain? Yes    Pain Score 6     Pain Location Shoulder    Pain Orientation Right    Pain Descriptors / Indicators Sharp;Aching    Pain Type Acute pain    Pain Onset More than a month ago    Pain Frequency Intermittent     Aggravating Factors  with use    Pain Relieving Factors being still, meds    Effect of Pain on Daily Activities ADLs, hair wash/style                             [facility] Adult PT Treatment/Exercise - 10/01/20 0001      Exercises   Exercises Shoulder      Shoulder Exercises: Supine   External Rotation AAROM;20 reps    External Rotation Limitations available arc of motion with dowel IR/ER Rt shoulder with dowel      Shoulder Exercises: Seated   Retraction AROM;Both;15 reps    Retraction Limitations hold 3 sec, relief with this    External Rotation Strengthening;Both;15 reps;Theraband    Theraband Level (Shoulder External Rotation) Level 1 (Yellow)    External Rotation Limitations isom only using tband: increased pain with this so d/c'd      Shoulder Exercises: Sidelying   Other Sidelying Exercises Rt shoulder scapular clocks for PNF patterns and A/ROM x 5 reps, asissted by PT initially then by Pt ind      Shoulder Exercises: Standing   Row Strengthening;20 reps;Theraband;Both    Theraband Level (Shoulder Row) Level 1 (Yellow)  Row Limitations small range      Shoulder Exercises: Pulleys   Flexion 3 minutes      Shoulder Exercises: Isometric Strengthening   Flexion Limitations 20x3"    Extension Limitations elbow bent; 20 x 3s   VC to prevent extension past neutral   Internal Rotation Limitations belly push; 20 x 3s    ABduction Limitations 20 x 3"      Manual Therapy   Manual Therapy Passive ROM;Soft tissue mobilization    Soft tissue mobilization Rt tissues around Rt GH joint, gentle    Passive ROM flexion and scaption within tolerable range, approx 70 deg                    PT Short Term Goals - 09/27/20 0923      PT SHORT TERM GOAL #1   Title Pt will be ind and compliant with initial HEP    Time 3    Period Weeks    Status On-going    Target Date 10/09/20             PT Long Term Goals - 09/18/20 1044      PT LONG TERM GOAL #1    Title Pt will be ind with advanced HEP and understand how to safely progress    Time 8    Period Weeks    Status New    Target Date 11/13/20      PT LONG TERM GOAL #2   Title Pt will improve Rt shoulder strength to at least 4+ all planes    Time 8    Period Weeks    Status New    Target Date 11/13/20      PT LONG TERM GOAL #3   Title Pt will be able to reach with Rt UE to shelf at shoulder height or greater with min or no pain with proper mechanics.    Time 8    Period Weeks    Status New    Target Date 11/13/20      PT LONG TERM GOAL #4   Title Pt will report improved pain and use of Rt UE with all ADLs and work tasks by at least 70%    Time 8    Period Weeks    Status New    Target Date 11/13/20      PT LONG TERM GOAL #5   Title Pt will be able to wash and style her hair with use of dominant Rt UE with min compensation and min or no pain.    Time 8    Period Weeks    Status New    Target Date 11/13/20                 Plan - 10/01/20 0814    Clinical Impression Statement Pt continues to have pain in Rt shoulder which may have increased somewhat since starting PT.  Aspects of her HEP are increasing her pain so PT recommended she only do exercises she tolerates without increased pain, such as wall isometrics, shoulder pulley, and row that was added today with yellow band in small range.  PT performed P/ROM for shoulder mobility today given pain with dowel use, STM to soft tissues surrounding GH joint, and Gr I oscillations at [facility] joint for pain relief.  She continues to have limited A/ROM limited by weakness and pain in Rt shoulder.  P/ROM limited for elevation to approx 70 deg today.  Scapular PNF  used in SL for A/ROM of scapula with good tolerance.  Continue along POC with ongoing assessment of response to treatment.    Comorbidities Rt humerus cancer, just finished radiation    PT Frequency 2x / week    PT Duration 8 weeks    PT Treatment/Interventions ADLs/Self  Care Home Management;Electrical Stimulation;Neuromuscular re-education;Manual techniques;Passive range of motion;Functional mobility training;Therapeutic activities;Therapeutic exercise;Joint Manipulations    PT Next Visit Plan shoulder pulleys, P/ROM, STM and Gr I/II joint mobs, continue scpaular clock in SL, yellow row, shoulder isom, progress AA/ROM when tolerated    PT Home Exercise Plan Access Code: [email or web address]    Consulted and Agree with Plan of Care Patient           Patient will benefit from skilled therapeutic intervention in order to improve the following deficits and impairments:     Visit Diagnosis: Right shoulder pain, unspecified chronicity  Muscle weakness (generalized)     Problem List Patient Active Problem List   Diagnosis Date Noted  . Right shoulder pain 08/26/2020  . Plasmacytoma ([place]) 06/28/2020  . Angioedema 01/21/2019  . Vertigo 09/28/2016  . Hyperlipidemia 09/25/2011  . Hypertension     Baruch Merl, PT 10/01/20 8:47 AM   [facility] [address], [place], [place], [address] Phone: [phone]   Fax:  [phone]  Name: Joanna Ray MRN: [id number] Date of Birth: 05-Apr-1951

## 2020-10-02 ENCOUNTER — Inpatient Hospital Stay (HOSPITAL_BASED_OUTPATIENT_CLINIC_OR_DEPARTMENT_OTHER): Payer: Medicare HMO | Admitting: Internal Medicine

## 2020-10-02 VITALS — BP 152/87 | HR 60 | Temp 97.0°F | Resp 19 | Ht 65.0 in | Wt 199.7 lb

## 2020-10-02 DIAGNOSIS — C9030 Solitary plasmacytoma not having achieved remission: Secondary | ICD-10-CM | POA: Diagnosis not present

## 2020-10-02 DIAGNOSIS — C9021 Extramedullary plasmacytoma in remission: Secondary | ICD-10-CM

## 2020-10-02 DIAGNOSIS — C9000 Multiple myeloma not having achieved remission: Secondary | ICD-10-CM | POA: Diagnosis not present

## 2020-10-02 DIAGNOSIS — Z7982 Long term (current) use of aspirin: Secondary | ICD-10-CM | POA: Diagnosis not present

## 2020-10-02 DIAGNOSIS — I10 Essential (primary) hypertension: Secondary | ICD-10-CM | POA: Diagnosis not present

## 2020-10-02 DIAGNOSIS — K219 Gastro-esophageal reflux disease without esophagitis: Secondary | ICD-10-CM | POA: Diagnosis not present

## 2020-10-02 DIAGNOSIS — Z79899 Other long term (current) drug therapy: Secondary | ICD-10-CM | POA: Diagnosis not present

## 2020-10-02 DIAGNOSIS — E785 Hyperlipidemia, unspecified: Secondary | ICD-10-CM | POA: Diagnosis not present

## 2020-10-02 NOTE — Progress Notes (Signed)
[facility] [phone]   [phone]  OFFICE PROGRESS NOTE  [name], MD [address] 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  PRIOR THERAPY: Palliative radiotherapy to the plasmacytoma of the proximal right humerus under the care of Dr. Sondra Come.  CURRENT THERAPY:   INTERVAL HISTORY: Joanna Ray 70 y.o. female returns to the clinic today for follow-up visit.  The patient is feeling fine today with no concerning complaints.  She denied having any pain in the right arm after the palliative radiotherapy.  She denied having any chest pain, shortness of breath, cough or hemoptysis.  She has no nausea, vomiting, diarrhea or constipation.  She has no fever or chills.  She is here today for evaluation with repeat myeloma panel.  MEDICAL HISTORY: Past Medical History:  Diagnosis Date  . Anxiety   . GERD (gastroesophageal reflux disease)   . Hyperlipidemia   . Hypertension   . Urticaria     ALLERGIES:  is allergic to shellfish allergy.  MEDICATIONS:  Current Outpatient Medications  Medication Sig Dispense Refill  . ALPRAZolam (XANAX) 0.5 MG tablet Take 1 tablet by mouth twice daily as needed for anxiety 60 tablet 5  . aspirin 81 MG tablet Take 81 mg by mouth daily.    . cholecalciferol (VITAMIN D) 1000 UNITS tablet Take 1,000 Units by mouth daily.    Marland Kitchen EPINEPHrine 0.3 mg/0.3 mL IJ SOAJ injection Inject 0.3 mLs (0.3 mg total) into the muscle as needed for anaphylaxis. 1 each 5  . escitalopram (LEXAPRO) 10 MG tablet 1 tab(s)    . fexofenadine (ALLEGRA) 180 MG tablet Take 1 tablet (180 mg total) by mouth daily. 90 tablet 1  . hydrocortisone valerate cream (WESTCORT) 0.2 % 1 app    . rosuvastatin (CRESTOR) 20 MG tablet Take 1/2 (one-half) tablet by mouth once daily 45 tablet 0  . traMADol (ULTRAM) 50 MG tablet Take 1 tablet  (50 mg total) by mouth every 6 (six) hours as needed. 30 tablet 0  . valsartan-hydrochlorothiazide (DIOVAN-HCT) 160-25 MG tablet Take 1 tablet by mouth once daily 90 tablet 0  . vitamin E 100 UNIT capsule Take 100 Units by mouth daily.     No current facility-administered medications for this visit.    SURGICAL HISTORY:  Past Surgical History:  Procedure Laterality Date  . CATARACT EXTRACTION, BILATERAL Bilateral   . DILATION AND CURETTAGE OF UTERUS      REVIEW OF SYSTEMS:  A comprehensive review of systems was negative.   PHYSICAL EXAMINATION: General appearance: alert, cooperative and no distress Head: Normocephalic, without obvious abnormality, atraumatic Neck: no adenopathy, no JVD, supple, symmetrical, trachea midline and thyroid not enlarged, symmetric, no tenderness/mass/nodules Lymph nodes: Cervical, supraclavicular, and axillary nodes normal. Resp: clear to auscultation bilaterally Back: symmetric, no curvature. ROM normal. No CVA tenderness. Cardio: regular rate and rhythm, S1, S2 normal, no murmur, click, rub or gallop GI: soft, non-tender; bowel sounds normal; no masses,  no organomegaly Extremities: extremities normal, atraumatic, no cyanosis or edema  ECOG PERFORMANCE STATUS: 1 - Symptomatic but completely ambulatory  Blood pressure (!) 152/87, pulse 60, temperature (!) 97 F (36.1 C), temperature source Tympanic, resp. rate 19, height _0  (1.651 m), weight 199 lb 11.2 oz (90.6 kg), SpO2 100 %.  LABORATORY DATA: Lab Results  Component Value Date   WBC 4.6 09/25/2020   HGB 12.0 09/25/2020  HCT 36.6 09/25/2020   MCV 93.4 09/25/2020   PLT 196 09/25/2020      Chemistry      Component Value Date/Time   NA 145 09/25/2020 1032   NA 145 (H) 12/28/2018 1436   K 3.9 09/25/2020 1032   CL 105 09/25/2020 1032   CO2 28 09/25/2020 1032   BUN 19 09/25/2020 1032   BUN 15 12/28/2018 1436   CREATININE 0.99 09/25/2020 1032   CREATININE 1.12 (H) 09/03/2020 0909       Component Value Date/Time   CALCIUM 9.5 09/25/2020 1032   ALKPHOS 45 09/25/2020 1032   AST 16 09/25/2020 1032   ALT 13 09/25/2020 1032   BILITOT 0.6 09/25/2020 1032       RADIOGRAPHIC STUDIES: No results found.  ASSESSMENT AND PLAN: This is a very pleasant 70 years old African-American female with plasmacytoma of the proximal right humerus diagnosed in February 2022.  The patient had extensive work-up for multiple myeloma including a bone marrow biopsy and aspirate that showed only 6% plasma cells.  Her protein studies still suspicious for underlying MGUS/multiple myeloma but the findings are not enough to call it multiple myeloma at this point. The patient underwent radiotherapy to the plasmacytoma of the proximal right humerus and tolerated the procedure fairly well.  She is currently on observation.  The skeletal bone survey showed no other lytic bone lesion except the proximal right humerus. She had repeat myeloma panel performed recently.  I personally discussed the lab results with the patient and there is no clear evidence for disease progression. I recommended for her to continue on observation with repeat myeloma panel and 6 months. The patient was advised to call immediately if she has any concerning symptoms in the interval. The patient voices understanding of current disease status and treatment options and is in agreement with the current care plan.  All questions were answered. The patient knows to call the clinic with any problems, questions or concerns. We can certainly see the patient much sooner if necessary.   Disclaimer: This note was dictated with voice recognition software. Similar sounding words can inadvertently be transcribed and may not be corrected upon review.

## 2020-10-08 ENCOUNTER — Telehealth: Payer: Self-pay | Admitting: Internal Medicine

## 2020-10-08 ENCOUNTER — Other Ambulatory Visit: Payer: Self-pay

## 2020-10-08 ENCOUNTER — Ambulatory Visit: Payer: Medicare HMO | Admitting: Physical Therapy

## 2020-10-08 DIAGNOSIS — M25511 Pain in right shoulder: Secondary | ICD-10-CM

## 2020-10-08 DIAGNOSIS — M6281 Muscle weakness (generalized): Secondary | ICD-10-CM

## 2020-10-08 DIAGNOSIS — R600 Localized edema: Secondary | ICD-10-CM | POA: Diagnosis not present

## 2020-10-08 NOTE — Telephone Encounter (Signed)
Scheduled per los. Called and left msg. Mailed printout  °

## 2020-10-08 NOTE — Therapy (Signed)
[facility] [address] [place], [place], [address] Phone: [phone]   Fax:  [phone]  Physical Therapy Treatment  Patient Details  Name: TANITH DAGOSTINO MRN: [id number] Date of Birth: 10/02/1950 Referring Provider (PT): Hayden Pedro, [place]   Encounter Date: 10/08/2020   PT End of Session - 10/08/20 0927     Visit Number 4    Date for PT Re-Evaluation 11/13/20    Authorization Time Period [employer] approved 12 visits, 09/18/2020-12/11/2020    Authorization - Visit Number 4    Authorization - Number of Visits 12    Progress Note Due on Visit 10    PT Start Time 0801    PT Stop Time 0839    PT Time Calculation (min) 38 min    Activity Tolerance Patient tolerated treatment well;Patient limited by pain    Behavior During Therapy [facility] for tasks assessed/performed             Past Medical History:  Diagnosis Date   Anxiety    GERD (gastroesophageal reflux disease)    Hyperlipidemia    Hypertension    Urticaria     Past Surgical History:  Procedure Laterality Date   CATARACT EXTRACTION, BILATERAL Bilateral    DILATION AND CURETTAGE OF UTERUS      There were no vitals filed for this visit.   Subjective Assessment - 10/08/20 0804     Subjective Patient reports some sorneness following last session. Hopes that he shoulder will not always feel this way.    Pertinent History final radiation treatment to Rt humerus 08/19/20    Limitations Lifting;Writing;Other (comment);House hold activities    Diagnostic tests MRI Rt shoulder 05/2020    Patient Stated Goals wash my hair, roll my hair at night    Currently in Pain? Yes    Pain Score 5     Pain Location Shoulder    Pain Orientation Right                               [facility] Adult PT Treatment/Exercise - 10/08/20 0001       Shoulder Exercises: Seated   Retraction AROM;Both;10 reps    Retraction Limitations 3s hold       Shoulder Exercises: Sidelying   External Rotation AROM;Right;10 reps    Other Sidelying Exercises Rt shoulder scapular clocks for PNF patterns; x10 repetitions      Shoulder Exercises: Standing   Flexion AAROM;Both;5 reps    Shoulder Flexion Weight (lbs) table slides; patient reporting significant pain; exercise not continued    Row Strengthening;Both;10 reps;Theraband    Theraband Level (Shoulder Row) Level 1 (Yellow)    Row Limitations tolerable range; VC for increased focus on scapular retraction      Shoulder Exercises: Pulleys   Flexion 3 minutes      Shoulder Exercises: Isometric Strengthening   Flexion Limitations 20x3"   elbow bent   Extension Limitations elbow bent; 20 x 3s   VC to avoid extension past neutral   Internal Rotation Limitations belly push; 20 x 3s   patient reporting that this exercise was most painful   ABduction Limitations 20 x 3"      Manual Therapy   Manual Therapy Soft tissue mobilization;Passive ROM    Soft tissue mobilization upper trap, muscles of RTC  PT Short Term Goals - 10/08/20 0926       PT SHORT TERM GOAL #1   Title Pt will be ind and compliant with initial HEP    Time 3    Period Weeks    Status On-going    Target Date 10/09/20      PT SHORT TERM GOAL #2   Title Improve FOTO by at least 10 points to 42%    Time 4    Period Weeks    Status On-going    Target Date 10/16/20      PT SHORT TERM GOAL #3   Title improve Rt shoulder strength to at least 4-/5    Time 4    Period Weeks    Status On-going    Target Date 10/16/20               PT Long Term Goals - 09/18/20 1044       PT LONG TERM GOAL #1   Title Pt will be ind with advanced HEP and understand how to safely progress    Time 8    Period Weeks    Status New    Target Date 11/13/20      PT LONG TERM GOAL #2   Title Pt will improve Rt shoulder strength to at least 4+ all planes    Time 8    Period Weeks    Status New     Target Date 11/13/20      PT LONG TERM GOAL #3   Title Pt will be able to reach with Rt UE to shelf at shoulder height or greater with min or no pain with proper mechanics.    Time 8    Period Weeks    Status New    Target Date 11/13/20      PT LONG TERM GOAL #4   Title Pt will report improved pain and use of Rt UE with all ADLs and work tasks by at least 70%    Time 8    Period Weeks    Status New    Target Date 11/13/20      PT LONG TERM GOAL #5   Title Pt will be able to wash and style her hair with use of dominant Rt UE with min compensation and min or no pain.    Time 8    Period Weeks    Status New    Target Date 11/13/20                   Plan - 10/08/20 0915     Clinical Impression Statement Patient demonstrates improved tolerance of PROM as she was able to tolerate passive flexion and abduction ROM to 95 degrees. Reporting no more than 4/10 pain with all exercises except table slides for flexion AAROM. Verbal and tactile cues provided for decreased scapular elevation with seated scapular retraction and row exercise. Would benefit from continued skilled therapeutic intervention to address impairments for improved functional use of R UE.    Personal Factors and Comorbidities Comorbidity 1;Profession    Comorbidities Rt humerus cancer, just finished radiation    Examination-Activity Limitations Bathing;Reach Overhead;Self Feeding;Carry;Sleep;Dressing;Lift;Hygiene/Grooming    Examination-Participation Restrictions Cleaning;Meal Prep;Occupation;Community Activity;Shop;Driving;Laundry;Yard Work    Rehab Potential Good    PT Frequency 2x / week    PT Duration 8 weeks    PT Treatment/Interventions ADLs/Self Care Home Management;Electrical Stimulation;Neuromuscular re-education;Manual techniques;Passive range of motion;Functional mobility training;Therapeutic activities;Therapeutic exercise;Joint Manipulations    PT  Next Visit Plan AAROM with progression as tolerated;  continue manual techniques; continue scapular mechanics and isometrics    PT Home Exercise Plan Access Code: [email or web address]    Consulted and Agree with Plan of Care Patient             Patient will benefit from skilled therapeutic intervention in order to improve the following deficits and impairments:  Postural dysfunction, Decreased strength, Pain, Decreased mobility, Impaired UE functional use, Impaired flexibility, Improper body mechanics, Increased fascial restricitons, Decreased range of motion  Visit Diagnosis: Muscle weakness (generalized)  Right shoulder pain, unspecified chronicity  Localized edema     Problem List Patient Active Problem List   Diagnosis Date Noted   Right shoulder pain 08/26/2020   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 [place], [place], [address] Phone: [phone]   Fax:  [phone]  Name: KENDRE SIRES MRN: [id number] Date of Birth: 08/06/1950

## 2020-10-15 ENCOUNTER — Ambulatory Visit: Payer: Medicare HMO | Admitting: Physical Therapy

## 2020-10-15 ENCOUNTER — Other Ambulatory Visit: Payer: Self-pay

## 2020-10-15 DIAGNOSIS — R600 Localized edema: Secondary | ICD-10-CM | POA: Diagnosis not present

## 2020-10-15 DIAGNOSIS — M6281 Muscle weakness (generalized): Secondary | ICD-10-CM | POA: Diagnosis not present

## 2020-10-15 DIAGNOSIS — M25511 Pain in right shoulder: Secondary | ICD-10-CM | POA: Diagnosis not present

## 2020-10-15 NOTE — Therapy (Signed)
[facility] [address], [place], [place], [address] Phone: [phone]   Fax:  [phone]  Physical Therapy Treatment  Patient Details  Name: Joanna Ray MRN: [id number] Date of Birth: 1950-06-15 Referring Provider (PT): Hayden Pedro, [place]   Encounter Date: 10/15/2020   PT End of Session - 10/15/20 0848     Visit Number 5    Date for PT Re-Evaluation 11/13/20    Authorization Time Period [employer] approved 12 visits, 09/18/2020-12/11/2020    Authorization - Visit Number 5    Authorization - Number of Visits 12    Progress Note Due on Visit 10    PT Start Time 0803    PT Stop Time 0844    PT Time Calculation (min) 41 min    Activity Tolerance Patient tolerated treatment well;Patient limited by pain    Behavior During Therapy [facility] for tasks assessed/performed             Past Medical History:  Diagnosis Date   Anxiety    GERD (gastroesophageal reflux disease)    Hyperlipidemia    Hypertension    Urticaria     Past Surgical History:  Procedure Laterality Date   CATARACT EXTRACTION, BILATERAL Bilateral    DILATION AND CURETTAGE OF UTERUS      There were no vitals filed for this visit.   Subjective Assessment - 10/15/20 0809     Subjective Has increased soreness this date. Was unable to perform HEP AROM exercises 2x/day due to soreness.    Pertinent History final radiation treatment to Rt humerus 08/19/20    Limitations Lifting;Writing;Other (comment);House hold activities    Diagnostic tests MRI Rt shoulder 05/2020    Patient Stated Goals wash my hair, roll my hair at night    Currently in Pain? Yes    Pain Score 7     Pain Location Shoulder    Pain Orientation Right    Pain Descriptors / Indicators Sore    Pain Type Acute pain    Pain Onset In the past 7 days    Pain Frequency Constant                               [facility] Adult PT Treatment/Exercise -  10/15/20 0001       Shoulder Exercises: Seated   Retraction AROM;Both;15 reps    Retraction Limitations 3s hold      Shoulder Exercises: Standing   External Rotation Strengthening;Right;10 reps;Theraband    Theraband Level (Shoulder External Rotation) Level 1 (Yellow)    External Rotation Weight (lbs) small ROM; TC/VC for focus on proper muscle activation    Internal Rotation Right;10 reps    Theraband Level (Shoulder Internal Rotation) Level 1 (Yellow)    Internal Rotation Weight (lbs) small ROM; TC/VC for focus on proper muscle activation    Row Strengthening;Both;10 reps;Theraband    Theraband Level (Shoulder Row) Level 1 (Yellow)    Row Limitations tolerable ROM, approx. 50%; TC for focus on rhomboid activation      Shoulder Exercises: Pulleys   Flexion 2 minutes   increased soreness; d/c'd early     Shoulder Exercises: Isometric Strengthening   Flexion Limitations 20x3"; elbow bent    Extension Limitations elbow bent; 20 x 3s    Internal Rotation Limitations belly push; 20 x 3"; patient reporting pain but reporting decreased pain when provided towel to push into  ABduction Limitations elbow bent; 20 x 3"                    PT Education - 10/15/20 0845     Education Details changed low row to 2 sets of 10 rather than 1 set of 20 reps; what is DOMS?; advised patient to take 1-2 days rest from HEP to address increased soreness    Person(s) Educated Patient    Methods Explanation;Demonstration    Comprehension Verbalized understanding;Returned demonstration              PT Short Term Goals - 10/15/20 0848       PT SHORT TERM GOAL #1   Title Pt will be ind and compliant with initial HEP    Time 3    Period Weeks    Status Achieved    Target Date 10/09/20               PT Long Term Goals - 09/18/20 1044       PT LONG TERM GOAL #1   Title Pt will be ind with advanced HEP and understand how to safely progress    Time 8    Period Weeks     Status New    Target Date 11/13/20      PT LONG TERM GOAL #2   Title Pt will improve Rt shoulder strength to at least 4+ all planes    Time 8    Period Weeks    Status New    Target Date 11/13/20      PT LONG TERM GOAL #3   Title Pt will be able to reach with Rt UE to shelf at shoulder height or greater with min or no pain with proper mechanics.    Time 8    Period Weeks    Status New    Target Date 11/13/20      PT LONG TERM GOAL #4   Title Pt will report improved pain and use of Rt UE with all ADLs and work tasks by at least 70%    Time 8    Period Weeks    Status New    Target Date 11/13/20      PT LONG TERM GOAL #5   Title Pt will be able to wash and style her hair with use of dominant Rt UE with min compensation and min or no pain.    Time 8    Period Weeks    Status New    Target Date 11/13/20                   Plan - 10/15/20 0845     Clinical Impression Statement Patient demonstrates improved activity tolerance as she was able to complete progression to tband exercises within small range. Tactile and verbal cues provided throughout session for appropriate muscle activation and for improved proprioception with regards to scapular positioning. Would benefit from continued skilled intervention to address impairments for improved functional use of Rt UE.    Personal Factors and Comorbidities Comorbidity 1;Profession    Comorbidities Rt humerus cancer, just finished radiation    Examination-Activity Limitations Bathing;Reach Overhead;Self Feeding;Carry;Sleep;Dressing;Lift;Hygiene/Grooming    Examination-Participation Restrictions Cleaning;Meal Prep;Occupation;Community Activity;Shop;Driving;Laundry;Yard Work    Rehab Potential Good    PT Frequency 2x / week    PT Duration 8 weeks    PT Treatment/Interventions ADLs/Self Care Home Management;Electrical Stimulation;Neuromuscular re-education;Manual techniques;Passive range of motion;Functional mobility  training;Therapeutic activities;Therapeutic exercise;Joint Manipulations  PT Next Visit Plan AAROM with progression as tolerated; continue manual techniques; continue scapular mechanics and isometrics    PT Home Exercise Plan Access Code: [email or web address]    Consulted and Agree with Plan of Care Patient             Patient will benefit from skilled therapeutic intervention in order to improve the following deficits and impairments:  Postural dysfunction, Decreased strength, Pain, Decreased mobility, Impaired UE functional use, Impaired flexibility, Improper body mechanics, Increased fascial restricitons, Decreased range of motion  Visit Diagnosis: Muscle weakness (generalized)  Right shoulder pain, unspecified chronicity  Localized edema  Acute pain of right shoulder     Problem List Patient Active Problem List   Diagnosis Date Noted   Right shoulder pain 08/26/2020   Plasmacytoma ([place]) 06/28/2020   Angioedema 01/21/2019   Vertigo 09/28/2016   Hyperlipidemia 09/25/2011   Hypertension     Muhamad Serano PT, DPT  10/15/20 8:49 AM   [facility] [address], [place], [place], [address] Phone: [phone]   Fax:  [phone]  Name: LETONIA STEAD MRN: [id number] Date of Birth: Sep 09, 1950

## 2020-10-16 ENCOUNTER — Emergency Department (HOSPITAL_COMMUNITY): Payer: Medicare HMO

## 2020-10-16 ENCOUNTER — Emergency Department (HOSPITAL_COMMUNITY)
Admission: EM | Admit: 2020-10-16 | Discharge: 2020-10-16 | Disposition: A | Discharge status: Home / Self Care | Payer: Medicare HMO | Attending: Emergency Medicine | Admitting: Emergency Medicine

## 2020-10-16 DIAGNOSIS — I10 Essential (primary) hypertension: Secondary | ICD-10-CM | POA: Diagnosis not present

## 2020-10-16 DIAGNOSIS — M79621 Pain in right upper arm: Secondary | ICD-10-CM | POA: Insufficient documentation

## 2020-10-16 DIAGNOSIS — M79601 Pain in right arm: Secondary | ICD-10-CM | POA: Diagnosis not present

## 2020-10-16 DIAGNOSIS — Z7982 Long term (current) use of aspirin: Secondary | ICD-10-CM | POA: Diagnosis not present

## 2020-10-16 DIAGNOSIS — Z79899 Other long term (current) drug therapy: Secondary | ICD-10-CM | POA: Diagnosis not present

## 2020-10-16 DIAGNOSIS — M84421A Pathological fracture, right humerus, initial encounter for fracture: Secondary | ICD-10-CM | POA: Diagnosis not present

## 2020-10-16 DIAGNOSIS — Z87891 Personal history of nicotine dependence: Secondary | ICD-10-CM | POA: Insufficient documentation

## 2020-10-16 MED ORDER — MORPHINE SULFATE (PF) 4 MG/ML IV SOLN
4.0000 mg | Freq: Once | INTRAVENOUS | Status: AC
  Administered 2020-10-16: 4 mg via INTRAMUSCULAR
  Filled 2020-10-16: qty 1

## 2020-10-16 MED ORDER — OXYCODONE-ACETAMINOPHEN 5-325 MG PO TABS: 1.0000 | ORAL_TABLET | Freq: Three times a day (TID) | ORAL | 0 refills | Status: DC | PRN

## 2020-10-16 MED ORDER — HYDROCODONE-ACETAMINOPHEN 5-325 MG PO TABS
1.0000 | ORAL_TABLET | Freq: Once | ORAL | Status: AC
  Administered 2020-10-16: 1 via ORAL
  Filled 2020-10-16: qty 1

## 2020-10-16 NOTE — Progress Notes (Signed)
Orthopedic Tech Progress Note Patient Details:  Joanna Ray Sep 16, 1950 [id number]  Ortho Devices Type of Ortho Device: Sling and swathe Ortho Device/Splint Location: right Ortho Device/Splint Interventions: Application   Post Interventions Patient Tolerated: Well Instructions Provided: Care of device  [place] Pink 10/16/2020, 10:43 AM

## 2020-10-16 NOTE — ED Triage Notes (Signed)
Pt complains of right arm pain for the past week. She is receiving radiation to arm for melanoma. She is receiving physical therapy for the arm but pain is not improving.

## 2020-10-16 NOTE — ED Notes (Signed)
Ortho tech called for sling and swath

## 2020-10-16 NOTE — ED Notes (Signed)
Pt ambulates independently, steady gait, husband driving pt home. Advised pt to wait several hours before taking another dose of pain medication

## 2020-10-16 NOTE — ED Provider Notes (Signed)
[facility] DEPT Provider Note   CSN: [id number] Arrival date & time: 10/16/20  6333     History Chief Complaint  Patient presents with   Arm Pain    Joanna Ray is a 70 y.o. female.  70 year old female with prior medical history as detailed below presents for evaluation of reported right upper extremity pain.  Patient with known oncologic lesion to the right proximal humerus.  She has prior documented pathologic fracture through same.  She reports increased pain to the area over the last several days.  Tylenol did not control her pain.  She denies any recent injury or fall.  She reports that she completed a course of radiation treatment to the area several weeks ago.  She is currently in PT for her right arm pain.  She request additional stronger pain medicine other than Tylenol.  The history is provided by the patient and medical records.  Arm Pain This is a recurrent problem. The current episode started more than 2 days ago. The problem occurs daily. The problem has not changed since onset.Pertinent negatives include no chest pain, no abdominal pain, no headaches and no shortness of breath.      Past Medical History:  Diagnosis Date   Anxiety    GERD (gastroesophageal reflux disease)    Hyperlipidemia    Hypertension    Urticaria     Patient Active Problem List   Diagnosis Date Noted   Right shoulder pain 08/26/2020   Plasmacytoma ([place]) 06/28/2020   Angioedema 01/21/2019   Vertigo 09/28/2016   Hyperlipidemia 09/25/2011   Hypertension     Past Surgical History:  Procedure Laterality Date   CATARACT EXTRACTION, BILATERAL Bilateral    DILATION AND CURETTAGE OF UTERUS       OB History   No obstetric history on file.     Family History  Problem Relation Age of Onset   Stroke Mother    Stroke Father    Diabetes Sister    Breast cancer Sister    Colon cancer Neg Hx    Colon polyps Neg Hx    Esophageal cancer Neg Hx     Rectal cancer Neg Hx    Stomach cancer Neg Hx     Social History   Tobacco Use   Smoking status: Former    Packs/day: 0.25    Years: 20.00    Pack years: 5.00    Types: Cigarettes    Quit date: 1980    Years since quitting: 42.5   Smokeless tobacco: Never  Vaping Use   Vaping Use: Never used  Substance Use Topics   Alcohol use: No   Drug use: No    Home Medications Prior to Admission medications   Medication Sig Start Date End Date Taking? Authorizing Provider  oxyCODONE-acetaminophen (PERCOCET/ROXICET) 5-325 MG tablet Take 1 tablet by mouth every 8 (eight) hours as needed for severe pain. 10/16/20  Yes Valarie Merino, MD  ALPRAZolam Duanne Moron) 0.5 MG tablet Take 1 tablet by mouth twice daily as needed for anxiety 06/16/20   [name], MD  aspirin 81 MG tablet Take 81 mg by mouth daily.    [provider]  cholecalciferol (VITAMIN D) 1000 UNITS tablet Take 1,000 Units by mouth daily.    [provider]  EPINEPHrine 0.3 mg/0.3 mL IJ SOAJ injection Inject 0.3 mLs (0.3 mg total) into the muscle as needed for anaphylaxis. 12/09/18   [name], MD  escitalopram (LEXAPRO) 10 MG  tablet 1 tab(s)    [provider]  fexofenadine (ALLEGRA) 180 MG tablet Take 1 tablet (180 mg total) by mouth daily. 06/29/19   Kennith Gain, MD  hydrocortisone valerate cream (WESTCORT) 0.2 % 1 app    [provider]  rosuvastatin (CRESTOR) 20 MG tablet Take 1/2 (one-half) tablet by mouth once daily 09/19/20   [name], MD  traMADol (ULTRAM) 50 MG tablet Take 1 tablet (50 mg total) by mouth every 6 (six) hours as needed. 05/17/20   Hilts, Legrand Como, MD  valsartan-hydrochlorothiazide (DIOVAN-HCT) 160-25 MG tablet Take 1 tablet by mouth once daily 09/19/20   [name], MD  vitamin E 100 UNIT capsule Take 100 Units by mouth daily.    [provider]    Allergies    Shellfish allergy  Review of Systems   Review of Systems  Respiratory:   Negative for shortness of breath.   Cardiovascular:  Negative for chest pain.  Gastrointestinal:  Negative for abdominal pain.  Neurological:  Negative for headaches.  All other systems reviewed and are negative.  Physical Exam Updated Vital Signs BP (!) 162/86   Pulse (!) 51   Temp 98.2 F (36.8 C) (Oral)   Resp 18   SpO2 100%   Physical Exam Vitals and nursing note reviewed.  Constitutional:      General: She is not in acute distress.    Appearance: Normal appearance. She is well-developed.  HENT:     Head: Normocephalic and atraumatic.  Eyes:     Conjunctiva/sclera: Conjunctivae normal.     Pupils: Pupils are equal, round, and reactive to light.  Cardiovascular:     Rate and Rhythm: Normal rate and regular rhythm.     Heart sounds: Normal heart sounds.  Pulmonary:     Effort: Pulmonary effort is normal. No respiratory distress.     Breath sounds: Normal breath sounds.  Abdominal:     General: There is no distension.     Palpations: Abdomen is soft.     Tenderness: There is no abdominal tenderness.  Musculoskeletal:        General: Swelling present. No deformity.     Cervical back: Normal range of motion and neck supple.     Comments: Moderate tenderness to palpation over the lateral aspect of the right proximal humerus.  Distal right upper extremity is neurovascular intact.  Skin:    General: Skin is warm and dry.  Neurological:     General: No focal deficit present.     Mental Status: She is alert and oriented to person, place, and time.    ED Results / Procedures / Treatments   Labs (all labs ordered are listed, but only abnormal results are displayed) Labs Reviewed - No data to display  EKG None  Radiology DG Humerus Right  Result Date: 10/16/2020 CLINICAL DATA:  Pain post radiation treatment. EXAM: RIGHT HUMERUS - 2+ VIEW COMPARISON:  Bone survey 07/03/2020 MRI 06/04/2020. FINDINGS: Lytic lesion with pathologic nondisplaced fracture again noted in the  proximal right humerus. No significant interval change. No new focal abnormalities identified. IMPRESSION: Lytic lesion with pathologic nondisplaced fracture again noted the proximal right humerus. No significant change. Electronically Signed   By: Marcello Moores  Register   On: 10/16/2020 08:30    Procedures Procedures   Medications Ordered in ED Medications  HYDROcodone-acetaminophen (NORCO/VICODIN) 5-325 MG per tablet 1 tablet (1 tablet Oral Given 10/16/20 0809)  morphine 4 MG/ML injection 4 mg (4 mg Intramuscular Given  10/16/20 1014)    ED Course  I have reviewed the triage vital signs and the nursing notes.  Pertinent labs & imaging results that were available during my care of the patient were reviewed by me and considered in my medical decision making (see chart for details).    MDM Rules/Calculators/A&P                          MDM  MSE complete  RAEYA MERRITTS was evaluated in Emergency Department on 10/16/2020 for the symptoms described in the history of present illness. She was evaluated in the context of the global COVID-19 pandemic, which necessitated consideration that the patient might be at risk for infection with the SARS-CoV-2 virus that causes COVID-19. Institutional protocols and algorithms that pertain to the evaluation of patients at risk for COVID-19 are in a state of rapid change based on information released by regulatory bodies including the CDC and federal and state organizations. These policies and algorithms were followed during the patient's care in the ED.  Patient with right arm pain.  Patient with known history of bony lesion of rioght proximal right humerus with previously documented pathologic fracture through same.  Patient reports increased pain over the last several days.  Pain not improved with administration of Tylenol at home  Plain film of the right proximal humerus does not reveal new or worsening pathology.  Patient does feel improved after  administration of pain medicines in ED.  Will prescribe a short course of narcotic pain medicine for use at home for the next several days.  Patient understands need for close FU. Strict return precautions given and understood.    Final Clinical Impression(s) / ED Diagnoses Final diagnoses:  Right arm pain    Rx / DC Orders ED Discharge Orders          Ordered    oxyCODONE-acetaminophen (PERCOCET/ROXICET) 5-325 MG tablet  Every 8 hours PRN        10/16/20 1038             Valarie Merino, MD 10/16/20 1051

## 2020-10-16 NOTE — Discharge Instructions (Addendum)
Return for any problem.  ?

## 2020-10-17 ENCOUNTER — Telehealth: Payer: Self-pay

## 2020-10-17 ENCOUNTER — Other Ambulatory Visit: Payer: Self-pay | Admitting: Internal Medicine

## 2020-10-17 MED ORDER — OXYCODONE-ACETAMINOPHEN 5-325 MG PO TABS: 1.0000 | ORAL_TABLET | Freq: Three times a day (TID) | ORAL | 0 refills | Status: DC | PRN

## 2020-10-17 NOTE — Telephone Encounter (Signed)
Spoke with pt regarding pain medication management. Message relayed to MD [facility].

## 2020-10-18 ENCOUNTER — Telehealth: Payer: Self-pay

## 2020-10-18 NOTE — Telephone Encounter (Signed)
This patient called and stated the Percocet that was prescribed at the ED visit on 6/22 for her arm pain has not been helping.  She states even though the order says 1 tablet every 8 hours she has been taking 2 tabs and that only dulls the pain she feels that she needs a stronger oral med. Patient  also requested  some pain patches as well. MD made aware.

## 2020-10-18 NOTE — Telephone Encounter (Signed)
-----   Message from Curt Bears, MD sent at 10/18/2020 10:48 AM EDT ----- Regarding: RE: Right Arm Pain She can use lidocaine patch over-the-counter in addition to her current pain medications.  I would not advise going to fentanyl patch at this point but she may need to check with her primary care physician if she is interested in giving her any additional pain medication.  I am expecting the radiotherapy will improve her pain soon.  Thank you. ----- Message ----- From: Estella Husk, LPN Sent: 0/37/0964   9:42 AM EDT To: Curt Bears, MD Subject: Right Arm Pain                                 Good Morning Dr. Julien Nordmann,   This patient called and stated the Percocet that was prescribed at the ED visit on 6/22 for her increased arm pain has not been helping.  She states even though the order says 1 tablet every 8 hours she has been taking 2 tabs and that only dulls the pain she feels that she needs a stronger oral med. She would also like to know if she can have some pain patches as well.     Namya Voges P. LPN

## 2020-10-18 NOTE — Telephone Encounter (Signed)
This nurse made patient aware of Dr. Worthy Flank recommendations.  Patient states that she is still having difficulty moving her arm and it is swollen due to the fracture.  Patient stated that she has completed her radiation treatment and that is how she received the hairline fracture in her right arm. This nurse advised patient Md will be made aware.

## 2020-10-22 ENCOUNTER — Encounter: Payer: Medicare HMO | Admitting: Physical Therapy

## 2020-10-24 ENCOUNTER — Other Ambulatory Visit: Payer: Self-pay

## 2020-10-24 ENCOUNTER — Ambulatory Visit: Payer: Medicare HMO | Admitting: Physical Therapy

## 2020-10-24 DIAGNOSIS — R600 Localized edema: Secondary | ICD-10-CM

## 2020-10-24 DIAGNOSIS — M6281 Muscle weakness (generalized): Secondary | ICD-10-CM

## 2020-10-24 DIAGNOSIS — M25511 Pain in right shoulder: Secondary | ICD-10-CM | POA: Diagnosis not present

## 2020-10-24 NOTE — Therapy (Addendum)
[facility] [address], [place], [place], [address] Phone: [phone]   Fax:  [phone]  Physical Therapy Treatment  Patient Details  Name: Joanna Ray MRN: [id number] Date of Birth: 01-08-1951 Referring Provider (PT): Joanna Ray, [place]   Encounter Date: 10/24/2020   PT End of Session - 10/24/20 1719     Visit Number 6    Date for PT Re-Evaluation 11/13/20    Authorization Type [employer]    Authorization Time Period [employer] approved 12 visits, 09/18/2020-12/11/2020    Authorization - Visit Number 6    Authorization - Number of Visits 12    Progress Note Due on Visit 10    PT Start Time 0930    PT Stop Time 1000    PT Time Calculation (min) 30 min    Activity Tolerance Patient limited by pain    Behavior During Therapy [facility] for tasks assessed/performed             Past Medical History:  Diagnosis Date   Anxiety    GERD (gastroesophageal reflux disease)    Hyperlipidemia    Hypertension    Urticaria     Past Surgical History:  Procedure Laterality Date   CATARACT EXTRACTION, BILATERAL Bilateral    DILATION AND CURETTAGE OF UTERUS      There were no vitals filed for this visit.   Subjective Assessment - 10/24/20 1710     Subjective Patient presenting with significantly increased pain this date. Has returned to use of sling. Had to be seen at ED over the weekend due to pain. Was prescribed stronger pain medication but this is only beneficial when she avoids movement of Rt UE.    Pertinent History final radiation treatment to Rt humerus 08/19/20    Limitations Lifting;Writing;Other (comment);House hold activities    Diagnostic tests MRI Rt shoulder 05/2020    Patient Stated Goals wash my hair, roll my hair at night    Currently in Pain? Yes    Pain Score 8     Pain Location Arm    Pain Orientation Right    Pain Descriptors / Indicators Aching    Pain Type Acute pain     Pain Onset In the past 7 days    Pain Frequency Constant                               [facility] Adult PT Treatment/Exercise - 10/24/20 0001       Shoulder Exercises: Seated   Retraction Weight (lbs) scap squeezes; 10 x 3s hold    Row Both;10 reps;Theraband    Theraband Level (Shoulder Row) Level 1 (Yellow)    Row Weight (lbs) partial ROM with shoulder extension to neutral only    Flexion Right    Flexion Weight (lbs) UE ranger x60s through tolerable range    Abduction Right    ABduction Weight (lbs) UE ranger x60s through tolerable range    Diagonals Right    Diagonals Weight (lbs) UE ranger x60s through tolerable range    Other Seated Exercises circles, UE range; x60s clockwise/counter clockwise                    PT Education - 10/24/20 1712     Education Details Educated patient on regressing HEP to [facility] within tolerable range, scap squeezes, and low row in limited range; wedge for more comfortable sleeping  Person(s) Educated Patient    Methods Explanation;Demonstration;Verbal cues    Comprehension Verbalized understanding;Returned demonstration;Verbal cues required              PT Short Term Goals - 10/24/20 1718       PT SHORT TERM GOAL #1   Title Pt will be ind and compliant with initial HEP    Time 3    Period Weeks    Status Achieved    Target Date 10/09/20      PT SHORT TERM GOAL #3   Title improve Rt shoulder strength to at least 4-/5    Time 4    Period Weeks    Status On-going    Target Date 10/16/20      PT SHORT TERM GOAL #4   Title Pt will report greater ease of use of Rt UE for ADLs including dressing, hair washing and styling hair on Rt side by at least 30%    Time 4    Period Weeks    Status On-going    Target Date 10/16/20               PT Long Term Goals - 09/18/20 1044       PT LONG TERM GOAL #1   Title Pt will be ind with advanced HEP and understand how to safely progress    Time 8     Period Weeks    Status New    Target Date 11/13/20      PT LONG TERM GOAL #2   Title Pt will improve Rt shoulder strength to at least 4+ all planes    Time 8    Period Weeks    Status New    Target Date 11/13/20      PT LONG TERM GOAL #3   Title Pt will be able to reach with Rt UE to shelf at shoulder height or greater with min or no pain with proper mechanics.    Time 8    Period Weeks    Status New    Target Date 11/13/20      PT LONG TERM GOAL #4   Title Pt will report improved pain and use of Rt UE with all ADLs and work tasks by at least 70%    Time 8    Period Weeks    Status New    Target Date 11/13/20      PT LONG TERM GOAL #5   Title Pt will be able to wash and style her hair with use of dominant Rt UE with min compensation and min or no pain.    Time 8    Period Weeks    Status New    Target Date 11/13/20                   Plan - 10/24/20 1714     Clinical Impression Statement Patient extremely limited by pain this date. Able to perform AAROM using UE ranger through very small range only. Patient performing low range through 25% ROM without increased pain. She wants to continue with therapy as she is able. Would benefit from continued skilled intervention to address impairments for improved functional use of Rt UE.    Personal Factors and Comorbidities Comorbidity 1;Profession    Comorbidities Rt humerus cancer, just finished radiation    Examination-Activity Limitations Bathing;Reach Overhead;Self Feeding;Carry;Sleep;Dressing;Lift;Hygiene/Grooming    Examination-Participation Restrictions Cleaning;Meal Prep;Occupation;Community Activity;Shop;Driving;Laundry;Yard Work    Geophysicist/field seismologist    PT  Frequency 2x / week    PT Duration 8 weeks    PT Treatment/Interventions ADLs/Self Care Home Management;Neuromuscular re-education;Manual techniques;Passive range of motion;Functional mobility training;Therapeutic activities;Therapeutic exercise;Joint  Manipulations    PT Next Visit Plan AAROM with progression as tolerated; continue manual techniques; continue scapular mechanics and isometrics if able    PT Home Exercise Plan Access Code: [email or web address]    Consulted and Agree with Plan of Care Patient             Patient will benefit from skilled therapeutic intervention in order to improve the following deficits and impairments:  Postural dysfunction, Decreased strength, Pain, Decreased mobility, Impaired UE functional use, Impaired flexibility, Improper body mechanics, Increased fascial restricitons, Decreased range of motion  Visit Diagnosis: Muscle weakness (generalized)  Acute pain of right shoulder  Localized edema     Problem List Patient Active Problem List   Diagnosis Date Noted   Right shoulder pain 08/26/2020   Plasmacytoma ([place]) 06/28/2020   Angioedema 01/21/2019   Vertigo 09/28/2016   Hyperlipidemia 09/25/2011   Hypertension     Camaria Gerald PT, DPT  10/24/20 5:25 PM  PHYSICAL THERAPY DISCHARGE SUMMARY  Visits from Start of Care: 6  Current functional level related to goals / functional outcomes: See above for current status.  Pt didn't return to PT.   Remaining deficits: See above for most current status.     Education / Equipment: HEP   Patient agrees to discharge. Patient goals were partially met. Patient is being discharged due to not returning since the last visit.  Sigurd Sos, PT 12/31/20 6:55 PM   [facility] [address] [place], [place], [address] Phone: [phone]   Fax:  [phone]  Name: Joanna Ray MRN: [id number] Date of Birth: Jul 09, 1950

## 2020-10-29 ENCOUNTER — Encounter: Payer: Medicare HMO | Admitting: Physical Therapy

## 2020-10-31 ENCOUNTER — Encounter: Payer: Medicare HMO | Admitting: Physical Therapy

## 2020-11-05 ENCOUNTER — Encounter: Payer: Medicare HMO | Admitting: Physical Therapy

## 2020-11-09 ENCOUNTER — Other Ambulatory Visit: Payer: Self-pay | Admitting: Oncology

## 2020-11-09 MED ORDER — OXYCODONE-ACETAMINOPHEN 5-325 MG PO TABS: 1.0000 | ORAL_TABLET | Freq: Three times a day (TID) | ORAL | 0 refills | Status: DC | PRN

## 2020-11-12 ENCOUNTER — Encounter: Payer: Medicare HMO | Admitting: Physical Therapy

## 2021-01-03 ENCOUNTER — Other Ambulatory Visit: Payer: Self-pay | Admitting: Internal Medicine

## 2021-01-03 DIAGNOSIS — F4321 Adjustment disorder with depressed mood: Secondary | ICD-10-CM

## 2021-01-03 DIAGNOSIS — H8110 Benign paroxysmal vertigo, unspecified ear: Secondary | ICD-10-CM

## 2021-01-24 ENCOUNTER — Telehealth: Payer: Self-pay | Admitting: Medical Oncology

## 2021-01-24 NOTE — Telephone Encounter (Signed)
   R arm sensation- Pt reports that in her right arm/shoulder area   "feels like something is moving around".   I instructed pt to contact her PCP or go to urgent care. Pt voiced understanding.  Pt insisted on being transferred to radiation which I did.

## 2021-02-18 ENCOUNTER — Ambulatory Visit: Payer: PRIVATE HEALTH INSURANCE | Admitting: Cardiology

## 2021-02-27 ENCOUNTER — Ambulatory Visit: Payer: PRIVATE HEALTH INSURANCE | Admitting: Cardiology

## 2021-03-04 ENCOUNTER — Other Ambulatory Visit: Payer: Medicare HMO | Admitting: Internal Medicine

## 2021-03-04 DIAGNOSIS — E7800 Pure hypercholesterolemia, unspecified: Secondary | ICD-10-CM

## 2021-03-04 DIAGNOSIS — I10 Essential (primary) hypertension: Secondary | ICD-10-CM

## 2021-03-07 ENCOUNTER — Other Ambulatory Visit: Payer: Medicare HMO | Admitting: Internal Medicine

## 2021-03-07 ENCOUNTER — Other Ambulatory Visit: Payer: Self-pay

## 2021-03-07 DIAGNOSIS — E7800 Pure hypercholesterolemia, unspecified: Secondary | ICD-10-CM | POA: Diagnosis not present

## 2021-03-07 DIAGNOSIS — Z1329 Encounter for screening for other suspected endocrine disorder: Secondary | ICD-10-CM | POA: Diagnosis not present

## 2021-03-07 DIAGNOSIS — I10 Essential (primary) hypertension: Secondary | ICD-10-CM

## 2021-03-07 DIAGNOSIS — Z0000 Encounter for general adult medical examination without abnormal findings: Secondary | ICD-10-CM

## 2021-03-08 LAB — CBC WITH DIFFERENTIAL/PLATELET
Absolute Monocytes: 629 cells/uL (ref 200–950)
Basophils Absolute: 72 cells/uL (ref 0–200)
Basophils Relative: 1.5 %
Eosinophils Absolute: 120 cells/uL (ref 15–500)
Eosinophils Relative: 2.5 %
HCT: 36.5 % (ref 35.0–45.0)
Hemoglobin: 12.2 g/dL (ref 11.7–15.5)
Lymphs Abs: 1546 cells/uL (ref 850–3900)
MCH: 31 pg (ref 27.0–33.0)
MCHC: 33.4 g/dL (ref 32.0–36.0)
MCV: 92.9 fL (ref 80.0–100.0)
MPV: 11.5 fL (ref 7.5–12.5)
Monocytes Relative: 13.1 %
Neutro Abs: 2434 cells/uL (ref 1500–7800)
Neutrophils Relative %: 50.7 %
Platelets: 226 10*3/uL (ref 140–400)
RBC: 3.93 10*6/uL (ref 3.80–5.10)
RDW: 12.6 % (ref 11.0–15.0)
Total Lymphocyte: 32.2 %
WBC: 4.8 10*3/uL (ref 3.8–10.8)

## 2021-03-08 LAB — LIPID PANEL
Cholesterol: 177 mg/dL (ref <200)
HDL: 54 mg/dL (ref >=50)
LDL Cholesterol (Calc): 104 mg/dL (calc) — ABNORMAL HIGH
Non-HDL Cholesterol (Calc): 123 mg/dL (calc) (ref <130)
Total CHOL/HDL Ratio: 3.3 (calc) (ref <5.0)
Triglycerides: 91 mg/dL (ref <150)

## 2021-03-08 LAB — COMPLETE METABOLIC PANEL WITH GFR
AG Ratio: 1.4 (calc) (ref 1.0–2.5)
ALT: 11 U/L (ref 6–29)
AST: 18 U/L (ref 10–35)
Albumin: 4.3 g/dL (ref 3.6–5.1)
Alkaline phosphatase (APISO): 48 U/L (ref 37–153)
BUN/Creatinine Ratio: 16 (calc) (ref 6–22)
BUN: 17 mg/dL (ref 7–25)
CO2: 32 mmol/L (ref 20–32)
Calcium: 9.6 mg/dL (ref 8.6–10.4)
Chloride: 104 mmol/L (ref 98–110)
Creat: 1.08 mg/dL — ABNORMAL HIGH (ref 0.60–1.00)
Globulin: 3.1 g/dL (calc) (ref 1.9–3.7)
Glucose, Bld: 87 mg/dL (ref 65–99)
Potassium: 4.2 mmol/L (ref 3.5–5.3)
Sodium: 142 mmol/L (ref 135–146)
Total Bilirubin: 0.6 mg/dL (ref 0.2–1.2)
Total Protein: 7.4 g/dL (ref 6.1–8.1)
eGFR: 55 mL/min/{1.73_m2} — ABNORMAL LOW (ref >=60)

## 2021-03-08 LAB — TSH: TSH: 1.31 mIU/L (ref 0.40–4.50)

## 2021-03-11 ENCOUNTER — Telehealth: Payer: Self-pay | Admitting: *Deleted

## 2021-03-11 ENCOUNTER — Encounter: Payer: Self-pay | Admitting: Internal Medicine

## 2021-03-11 ENCOUNTER — Other Ambulatory Visit: Payer: Self-pay | Admitting: Radiation Oncology

## 2021-03-11 ENCOUNTER — Other Ambulatory Visit: Payer: Self-pay

## 2021-03-11 ENCOUNTER — Ambulatory Visit (INDEPENDENT_AMBULATORY_CARE_PROVIDER_SITE_OTHER): Payer: Medicare HMO | Admitting: Internal Medicine

## 2021-03-11 VITALS — BP 130/82 | HR 63 | Temp 97.9°F | Ht 65.0 in | Wt 208.0 lb

## 2021-03-11 DIAGNOSIS — Z1211 Encounter for screening for malignant neoplasm of colon: Secondary | ICD-10-CM

## 2021-03-11 DIAGNOSIS — N951 Menopausal and female climacteric states: Secondary | ICD-10-CM | POA: Diagnosis not present

## 2021-03-11 DIAGNOSIS — Z23 Encounter for immunization: Secondary | ICD-10-CM

## 2021-03-11 DIAGNOSIS — C9030 Solitary plasmacytoma not having achieved remission: Secondary | ICD-10-CM

## 2021-03-11 DIAGNOSIS — M25511 Pain in right shoulder: Secondary | ICD-10-CM

## 2021-03-11 NOTE — Telephone Encounter (Signed)
Returned call to the patient regarding voicemail she left.  She reports she continues to have pain in the right arm.  She states she saw her PCP and was told to reach out to Dr. Ida Rogue office since she was treated with radiation earlier this year.  After discussion with the PA it was decided that the patient would need imaging and to be seen by a orthopedic physician.  Patient states she does not currently have an orthopedic physician.  She was informed that we will order imaging and send a referral to orthopedics for her.  She verbalized understanding.  She was advised to give [place] a call back if she does not hear from scheduling regarding these appointments.  Gloriajean Dell. Leonie Green, BSN

## 2021-03-11 NOTE — Progress Notes (Signed)
   Subjective:    Patient ID: Joanna Ray, female    DOB: July 15, 1950, 70 y.o.   MRN: [id number]  HPI 70 year old Female for 6 month follow up.  Had plasmacytoma right humerus diagnosed in March 2022 and followed by Dr. Earlie Server.  Has received radiation therapy to right humerus plasmacytoma.  Had 6% plasma cells on bone marrow biopsy and aspirate.  Findings  are not enough to call this multiple myeloma at the present time.  She has a history of hyperlipidemia treated with Crestor 20 mg daily, hypertension treated with Diovan HCTZ and allergic rhinitis treated with Allegra.  Has not had colon cancer screening  History of  depression treated with Lexapro.  History of anxiety treated with Xanax.  Presented to the emergency department with chest pain March 2021.  MI was ruled out.  Echocardiogram showed mild aortic regurgitation.  Myocardial perfusion nuclear stress test was normal.  Is followed by Dr. Einar Gip and Dr. Terri Skains  Social history: Husband is retired and has congestive heart failure.  Patient has not smoked in over 20 years.  She has adult children.  Teaches at [employer].  No alcohol consumption.    Review of Systems will receive flu vaccine today. Will get Covid booster soon.     Objective:   Physical Exam Blood pressure 130/82 pulse 63 temperature 97.9 degrees pulse oximetry 98% weight 208 pounds BMI 34.61  Skin: Warm and dry.  No cervical adenopathy or thyromegaly.  No carotid bruits.  Chest is clear to auscultation.  Cardiac exam: Regular rate and rhythm.  No lower extremity pitting edema.       Assessment & Plan:  Hyperlipidemia-LDL elevated at 104.  She is on statin therapy with Crestor and should continue with that.  History of plasmacytoma followed by Dr. Earlie Server.  Being watched for multiple myeloma.  Creatinine 1.08  Essential hypertension-stable on current regimen consisting of Diovan HCTZ  Anxiety depression treated with Xanax and Lexapro  History of mild  aortic regurgitation.  Followed by Dr. Einar Gip and Dr. Terri Skains  Plan: Continue current medications.  Reminded to get COVID booster.  Received flu vaccine today.  Return in 6 months for health maintenance exam.  Needs colon cancer screening.

## 2021-03-11 NOTE — Progress Notes (Signed)
See nurse triage note from today's telephone encounters.  Patient was treated to the right humerus in the spring 2022 for plasmacytoma with features suspicious for early multiple myeloma.  She tolerated radiotherAnd appears that by x-ray on 10/16/2020 in the emergency department she had a lytic lesion with pathologic nondisplaced fracture again noted in the proximal right humerus.  There was no significant change to her comparison images from her bone survey in March 2022 or her MRI in February 2022.  Interestingly though her MRI of the shoulder in February did not call out fracture.  She called today stating that she is still feeling like her arm has not healed and would like to have an x-ray.  We offered her plain films of the right humerus which will be scheduled at her convenience, as well as referral to orthopedics to discuss possible pinning.  Otherwise she will continue in observation with Dr. Julien Nordmann as her protein studies are still suspicious for underlying multiple myeloma but the findings are not enough to call it multiple myeloma definitively.apy well.  Following radiation, she developed progressive pain

## 2021-03-13 ENCOUNTER — Ambulatory Visit: Payer: PRIVATE HEALTH INSURANCE | Admitting: Cardiology

## 2021-03-23 ENCOUNTER — Other Ambulatory Visit: Payer: Self-pay

## 2021-03-23 ENCOUNTER — Emergency Department (HOSPITAL_BASED_OUTPATIENT_CLINIC_OR_DEPARTMENT_OTHER)
Admission: EM | Admit: 2021-03-23 | Discharge: 2021-03-23 | Disposition: A | Discharge status: Home / Self Care | Payer: Medicare HMO | Attending: Emergency Medicine | Admitting: Emergency Medicine

## 2021-03-23 ENCOUNTER — Encounter (HOSPITAL_BASED_OUTPATIENT_CLINIC_OR_DEPARTMENT_OTHER): Payer: Self-pay

## 2021-03-23 DIAGNOSIS — R059 Cough, unspecified: Secondary | ICD-10-CM | POA: Diagnosis present

## 2021-03-23 DIAGNOSIS — Z79899 Other long term (current) drug therapy: Secondary | ICD-10-CM | POA: Insufficient documentation

## 2021-03-23 DIAGNOSIS — Z87891 Personal history of nicotine dependence: Secondary | ICD-10-CM | POA: Insufficient documentation

## 2021-03-23 DIAGNOSIS — Z7982 Long term (current) use of aspirin: Secondary | ICD-10-CM | POA: Diagnosis not present

## 2021-03-23 DIAGNOSIS — U071 COVID-19: Secondary | ICD-10-CM | POA: Diagnosis not present

## 2021-03-23 DIAGNOSIS — I10 Essential (primary) hypertension: Secondary | ICD-10-CM | POA: Diagnosis not present

## 2021-03-23 LAB — RESP PANEL BY RT-PCR (FLU A&B, COVID) ARPGX2
Influenza A by PCR: NEGATIVE
Influenza B by PCR: NEGATIVE
SARS Coronavirus 2 by RT PCR: POSITIVE — AB

## 2021-03-23 MED ORDER — ACETAMINOPHEN 325 MG PO TABS
650.0000 mg | ORAL_TABLET | Freq: Once | ORAL | Status: AC | PRN
  Administered 2021-03-23: 18:00:00 650 mg via ORAL
  Filled 2021-03-23: qty 2

## 2021-03-23 MED ORDER — NIRMATRELVIR/RITONAVIR (PAXLOVID) TABLET (RENAL DOSING): 2.0000 | ORAL_TABLET | Freq: Two times a day (BID) | ORAL | 0 refills | Status: AC

## 2021-03-23 NOTE — ED Notes (Signed)
Pt is covid positive. Dr. Maryan Rued aware.

## 2021-03-23 NOTE — ED Triage Notes (Signed)
Pt presents with cough, fever, chills and body aches x1 day, spouse was admitted Friday with covid. Pt received a flu vaccines 2 weeks ago

## 2021-03-23 NOTE — ED Provider Notes (Signed)
Joanna Ray Provider Note   CSN: [id number] Arrival date & time: 03/23/21  1637     History Chief Complaint  Patient presents with   Cough   Fever   Generalized Body Aches    Joanna Ray is a 70 y.o. female.  The history is provided by the patient.  URI Presenting symptoms: congestion, cough, fever and sore throat   Presenting symptoms comment:  Fatigue and myalgias Severity:  Moderate Onset quality:  Gradual Duration:  1 day Timing:  Constant Progression:  Worsening Chronicity:  New Relieved by:  None tried Worsened by:  Nothing Ineffective treatments:  None tried Associated symptoms: myalgias   Associated symptoms: no wheezing   Associated symptoms comment:  No SOB, no n/v/d Risk factors: being elderly   Risk factors comment:  Hx of MM and HTN.  husband currently admitted for covid.  has recieved covid and flu vaccine     Past Medical History:  Diagnosis Date   Anxiety    GERD (gastroesophageal reflux disease)    Hyperlipidemia    Hypertension    Urticaria     Patient Active Problem List   Diagnosis Date Noted   Right shoulder pain 08/26/2020   Plasmacytoma ([place]) 06/28/2020   Angioedema 01/21/2019   Vertigo 09/28/2016   Hyperlipidemia 09/25/2011   Hypertension     Past Surgical History:  Procedure Laterality Date   CATARACT EXTRACTION, BILATERAL Bilateral    DILATION AND CURETTAGE OF UTERUS       OB History   No obstetric history on file.     Family History  Problem Relation Age of Onset   Stroke Mother    Stroke Father    Diabetes Sister    Breast cancer Sister    Colon cancer Neg Hx    Colon polyps Neg Hx    Esophageal cancer Neg Hx    Rectal cancer Neg Hx    Stomach cancer Neg Hx     Social History   Tobacco Use   Smoking status: Former    Packs/day: 0.25    Years: 20.00    Pack years: 5.00    Types: Cigarettes    Quit date: 1980    Years since quitting: 42.9   Smokeless tobacco:  Never  Vaping Use   Vaping Use: Never used  Substance Use Topics   Alcohol use: No   Drug use: No    Home Medications Prior to Admission medications   Medication Sig Start Date End Date Taking? Authorizing Provider  ALPRAZolam Duanne Moron) 0.5 MG tablet Take 1 tablet by mouth twice daily as needed for anxiety 01/03/21   [name], MD  aspirin 81 MG tablet Take 81 mg by mouth daily.    [provider]  cholecalciferol (VITAMIN D) 1000 UNITS tablet Take 1,000 Units by mouth daily.    [provider]  EPINEPHrine 0.3 mg/0.3 mL IJ SOAJ injection Inject 0.3 mLs (0.3 mg total) into the muscle as needed for anaphylaxis. 12/09/18   [name], MD  escitalopram (LEXAPRO) 10 MG tablet 1 tab(s)    [provider]  fexofenadine (ALLEGRA) 180 MG tablet Take 1 tablet (180 mg total) by mouth daily. 06/29/19   Kennith Gain, MD  hydrocortisone valerate cream (WESTCORT) 0.2 % 1 app    [provider]  oxyCODONE-acetaminophen (PERCOCET/ROXICET) 5-325 MG tablet Take 1 tablet by mouth every 8 (eight) hours as needed for severe pain. 11/09/20   Ladell Pier, MD  rosuvastatin (CRESTOR) 20 MG tablet Take 1/2 (one-half) tablet by mouth once daily 01/03/21   [name], MD  traMADol (ULTRAM) 50 MG tablet Take 1 tablet (50 mg total) by mouth every 6 (six) hours as needed. 05/17/20   Hilts, Legrand Como, MD  valsartan-hydrochlorothiazide (DIOVAN-HCT) 160-25 MG tablet Take 1 tablet by mouth once daily 01/03/21   [name], MD  vitamin E 100 UNIT capsule Take 100 Units by mouth daily.    [provider]    Allergies    Shellfish allergy  Review of Systems   Review of Systems  Constitutional:  Positive for fever.  HENT:  Positive for congestion and sore throat.   Respiratory:  Positive for cough. Negative for wheezing.   Musculoskeletal:  Positive for myalgias.  All other systems reviewed and are negative.  Physical Exam Updated Vital Signs BP (!)  166/81   Pulse 88   Temp (!) 100.9 F (38.3 C)   Resp 15   SpO2 94%   Physical Exam Vitals and nursing note reviewed.  Constitutional:      General: She is not in acute distress.    Appearance: She is well-developed.  HENT:     Head: Normocephalic and atraumatic.     Right Ear: Tympanic membrane normal.     Left Ear: Tympanic membrane normal.     Nose: Congestion present.     Mouth/Throat:     Mouth: Mucous membranes are moist.     Pharynx: Posterior oropharyngeal erythema present.  Eyes:     Pupils: Pupils are equal, round, and reactive to light.  Cardiovascular:     Rate and Rhythm: Normal rate and regular rhythm.     Heart sounds: Normal heart sounds. No murmur heard.   No friction rub.  Pulmonary:     Effort: Pulmonary effort is normal.     Breath sounds: Normal breath sounds. No wheezing or rales.  Abdominal:     General: Bowel sounds are normal. There is no distension.     Palpations: Abdomen is soft.     Tenderness: There is no abdominal tenderness. There is no guarding or rebound.  Musculoskeletal:        General: No tenderness. Normal range of motion.     Cervical back: Normal range of motion and neck supple.     Comments: No edema  Lymphadenopathy:     Cervical: No cervical adenopathy.  Skin:    General: Skin is warm and dry.     Findings: No rash.  Neurological:     Mental Status: She is alert and oriented to person, place, and time. Mental status is at baseline.     Cranial Nerves: No cranial nerve deficit.  Psychiatric:        Mood and Affect: Mood normal.        Behavior: Behavior normal.    ED Results / Procedures / Treatments   Labs (all labs ordered are listed, but only abnormal results are displayed) Labs Reviewed  RESP PANEL BY RT-PCR (FLU A&B, COVID) ARPGX2 - Abnormal; Notable for the following components:      Result Value   SARS Coronavirus 2 by RT PCR POSITIVE (*)    All other components within normal limits     EKG None  Radiology No results found.  Procedures Procedures   Medications Ordered in ED Medications  acetaminophen (TYLENOL) tablet 650 mg (650 mg Oral Given 03/23/21 1732)    ED Course  I have reviewed the  triage vital signs and the nursing notes.  Pertinent labs & imaging results that were available during my care of the patient were reviewed by me and considered in my medical decision making (see chart for details).    MDM Rules/Calculators/A&P                           Pt with symptoms consistent with influenza/covid/viral illness.  Normal exam here but is febrile.  No signs of breathing difficulty  No signs of strep pharyngitis, otitis or abnormal abdominal findings.   Patient has tested positive for COVID.  Speaking with the pharmacist she is a candidate for Paxlovid. Will continue antipyretica and rest and fluids and return for any further problems.  Final Clinical Impression(s) / ED Diagnoses Final diagnoses:  COVID    Rx / DC Orders ED Discharge Orders          Ordered    nirmatrelvir/ritonavir EUA, renal dosing, (PAXLOVID) 10 x 150 MG & 10 x 100MG  TABS  2 times daily        03/23/21 Sherry Ruffing, MD 03/23/21 1924

## 2021-03-23 NOTE — ED Notes (Signed)
Pt discharged to home. Discharge instructions have been discussed with patient and/or family members. Pt verbally acknowledges understanding d/c instructions, and endorses comprehension to checkout at registration before leaving.  °

## 2021-03-23 NOTE — ED Notes (Signed)
Pt denies shob 

## 2021-03-25 ENCOUNTER — Ambulatory Visit: Payer: Medicare HMO | Admitting: Orthopaedic Surgery

## 2021-03-26 ENCOUNTER — Telehealth (INDEPENDENT_AMBULATORY_CARE_PROVIDER_SITE_OTHER): Payer: Medicare HMO | Admitting: Internal Medicine

## 2021-03-26 ENCOUNTER — Encounter: Payer: Self-pay | Admitting: Internal Medicine

## 2021-03-26 ENCOUNTER — Other Ambulatory Visit: Payer: Self-pay

## 2021-03-26 ENCOUNTER — Telehealth: Payer: Self-pay | Admitting: Internal Medicine

## 2021-03-26 VITALS — BP 148/81 | HR 61 | Temp 97.1°F

## 2021-03-26 DIAGNOSIS — U071 COVID-19: Secondary | ICD-10-CM | POA: Diagnosis not present

## 2021-03-26 DIAGNOSIS — J22 Unspecified acute lower respiratory infection: Secondary | ICD-10-CM

## 2021-03-26 DIAGNOSIS — Z20822 Contact with and (suspected) exposure to covid-19: Secondary | ICD-10-CM | POA: Diagnosis not present

## 2021-03-26 MED ORDER — AZITHROMYCIN 250 MG PO TABS: ORAL_TABLET | ORAL | 0 refills | Status: AC

## 2021-03-26 NOTE — Progress Notes (Signed)
   Subjective:    Patient ID: Joanna Ray, female    DOB: 1951-02-17, 70 y.o.   MRN: [id number]  HPI 70 year old Female seen via interactive audio and video telecommunications today due to the Coronavirus pandemic.  Patient is at her home and I am at my office.  She is identified using 2 identifiers as Darene I. Sharlett Iles, a patient in this practice.  She is agreeable to visit in this format today.  Tells me her husband was recently hospitalized with COVID-19 and congestive heart failure.  She subsequently contracted COVID-19 herself.  Patient has history of single solitary plasmacytoma in her arm and is under the care of oncology.  She was seen in the Emergency Department on November 27 at [facility] complaining of cough, fever and sore throat along with fatigue and myalgias.  She tested positive for COVID-19 there on November 27.  Was prescribed Paxlovid Apparently did start the medication but is wondering if we can give her something else because she feels that she is still having quite a bit of chest congestion and  Paxlovid is not helping that.  Explained to her that it would take several days for this virus to resolve.  She is taking Mucinex which is an expectorant.  Explained that it was important to stay well-hydrated to walk some to prevent atelectasis and to check her pulse oximetry frequently which she has been doing.    Review of Systems see above     Objective:   Physical Exam  She is seen virtually in no acute distress.  She is not tachypneic.  She is able to give a clear concise history.  She sounds a bit nasally congested.      Assessment & Plan:   Acute COVID-19 virus infection  Plan: I have prescribed additionally Zithromax Z-PAK 2 tabs day 1 followed by 1 tab days 2 through 5.  Continue with Mucinex and Paxlovid.  Must quarantine for a total of 5 days.  Time spent with patient is 15 minutes

## 2021-03-26 NOTE — Telephone Encounter (Signed)
Joanna Ray [phone]  Otila called to say she was diagnosed on Sunday with COVID, she is taking the Paxlovid and they had told her to take Musinex . She stated it is not helping her get the stuff up out of her chest. She wanted to know if there is something else you could give her. Do you want to do Video visit today or tomorrow?

## 2021-03-26 NOTE — Telephone Encounter (Signed)
Video Visit?

## 2021-03-26 NOTE — Telephone Encounter (Signed)
scheduled

## 2021-03-26 NOTE — Patient Instructions (Addendum)
Referral for colon cancer screening.  Continue current medications and return in 6 months.  Continue close follow-up with Dr. Earlie Server.  Flu vaccine given today.  Get COVID booster soon.

## 2021-03-26 NOTE — Patient Instructions (Signed)
Take Zithromax Z-PAK in addition to Paxlovid.  Rest and drink fluids.  Monitor pulse oximetry.  Quarantine for 5 days.  May take 10 days for symptoms to resolve.

## 2021-03-27 ENCOUNTER — Inpatient Hospital Stay: Payer: Medicare HMO

## 2021-03-28 ENCOUNTER — Ambulatory Visit: Payer: PRIVATE HEALTH INSURANCE | Admitting: Cardiology

## 2021-03-29 DIAGNOSIS — Z03818 Encounter for observation for suspected exposure to other biological agents ruled out: Secondary | ICD-10-CM | POA: Diagnosis not present

## 2021-03-29 DIAGNOSIS — Z20822 Contact with and (suspected) exposure to covid-19: Secondary | ICD-10-CM | POA: Diagnosis not present

## 2021-04-01 ENCOUNTER — Other Ambulatory Visit: Payer: Self-pay

## 2021-04-01 ENCOUNTER — Telehealth: Payer: Self-pay | Admitting: Internal Medicine

## 2021-04-01 ENCOUNTER — Ambulatory Visit (INDEPENDENT_AMBULATORY_CARE_PROVIDER_SITE_OTHER): Payer: Medicare HMO | Admitting: Orthopaedic Surgery

## 2021-04-01 ENCOUNTER — Ambulatory Visit: Payer: Self-pay

## 2021-04-01 ENCOUNTER — Encounter: Payer: Self-pay | Admitting: Orthopaedic Surgery

## 2021-04-01 DIAGNOSIS — M84421A Pathological fracture, right humerus, initial encounter for fracture: Secondary | ICD-10-CM

## 2021-04-01 NOTE — Progress Notes (Signed)
Office Visit Note   Patient: Joanna Ray           Date of Birth: 1951-04-23           MRN: [id number] Visit Date: 04/01/2021              Requested by: Hayden Pedro, PA-C [place],  [place] [address] PCP: [name], MD   Assessment & Plan: Visit Diagnoses:  1. Pathological fracture, right humerus, initial encounter for fracture     Plan: Sharniece comes in today for continued right shoulder pain.  Briefly she has a diagnosis of multiple myeloma which was found earlier this year.  She underwent radiation therapy for this.  She states that she continues to have right shoulder pain with any sort of gentle range of motion or activity.  She has pain even with lifting a cell phone or the weight of her arm.  Right shoulder exam is very limited secondary to pain and guarding.  No neurovascular compromise distally.  X-rays from today do show that there is presence of a large lytic lesion of the proximal humerus with cortical erosion concerning for active myeloma.  With ongoing symptoms I am concerned that she has an impending pathologic fracture.  Given these findings I have made a referral to [facility] for subspecialty care with Dr. Magda Bernheim or [facility].  In the meantime of asked her to be very careful with the arm and to wear the shoulder sling  Follow-Up Instructions: No follow-ups on file.   Orders:  Orders Placed This Encounter  Procedures   XR Shoulder Right   No orders of the defined types were placed in this encounter.     Procedures: No procedures performed   Clinical Data: No additional findings.   Subjective: Chief Complaint  Patient presents with   Right Shoulder - Pain    HPI  Review of Systems   Objective: Vital Signs: There were no vitals taken for this visit.  Physical Exam  Ortho Exam  Specialty Comments:  No specialty comments available.  Imaging: XR Shoulder Right  Result Date:  04/01/2021 Large lytic lesion with cortical erosion of the right proximal humerus.    PMFS History: Patient Active Problem List   Diagnosis Date Noted   Right shoulder pain 08/26/2020   Plasmacytoma ([place]) 06/28/2020   Angioedema 01/21/2019   Vertigo 09/28/2016   Hyperlipidemia 09/25/2011   Hypertension    Past Medical History:  Diagnosis Date   Anxiety    GERD (gastroesophageal reflux disease)    Hyperlipidemia    Hypertension    Urticaria     Family History  Problem Relation Age of Onset   Stroke Mother    Stroke Father    Diabetes Sister    Breast cancer Sister    Colon cancer Neg Hx    Colon polyps Neg Hx    Esophageal cancer Neg Hx    Rectal cancer Neg Hx    Stomach cancer Neg Hx     Past Surgical History:  Procedure Laterality Date   CATARACT EXTRACTION, BILATERAL Bilateral    DILATION AND CURETTAGE OF UTERUS     Social History   Occupational History   Occupation: Theatre manager  Tobacco Use   Smoking status: Former    Packs/day: 0.25    Years: 20.00    Pack years: 5.00    Types: Cigarettes    Quit date: 1980    Years  since quitting: 42.9   Smokeless tobacco: Never  Vaping Use   Vaping Use: Never used  Substance and Sexual Activity   Alcohol use: No   Drug use: No   Sexual activity: Not on file

## 2021-04-01 NOTE — Telephone Encounter (Signed)
[place] results to [facility] [phone], phone [phone]   +COVID 03/23/2021

## 2021-04-02 ENCOUNTER — Other Ambulatory Visit: Payer: Self-pay

## 2021-04-02 DIAGNOSIS — M84421A Pathological fracture, right humerus, initial encounter for fracture: Secondary | ICD-10-CM

## 2021-04-03 ENCOUNTER — Inpatient Hospital Stay: Payer: Medicare HMO | Admitting: Internal Medicine

## 2021-04-10 ENCOUNTER — Telehealth: Payer: Self-pay | Admitting: Orthopaedic Surgery

## 2021-04-10 NOTE — Telephone Encounter (Signed)
Pt called stating she has n appt on 04/14/21 with a Dr., Dr.Xu referred her to. She needs to get her x-rays but would like to know if when she comes in on 04/11/21 to sign the release form can we already have the forms ready? Pt would like a CB with an answer please.   [phone]

## 2021-04-11 NOTE — Telephone Encounter (Signed)
IC,sw pt. She stated she just picked up the CD. No further action is needed.

## 2021-04-14 DIAGNOSIS — M84521A Pathological fracture in neoplastic disease, right humerus, initial encounter for fracture: Secondary | ICD-10-CM | POA: Diagnosis not present

## 2021-04-14 DIAGNOSIS — G8929 Other chronic pain: Secondary | ICD-10-CM | POA: Diagnosis not present

## 2021-04-14 DIAGNOSIS — C9030 Solitary plasmacytoma not having achieved remission: Secondary | ICD-10-CM | POA: Diagnosis not present

## 2021-04-14 DIAGNOSIS — M25511 Pain in right shoulder: Secondary | ICD-10-CM | POA: Diagnosis not present

## 2021-04-17 ENCOUNTER — Ambulatory Visit: Payer: PRIVATE HEALTH INSURANCE | Admitting: Cardiology

## 2021-04-18 ENCOUNTER — Other Ambulatory Visit (HOSPITAL_COMMUNITY): Payer: Self-pay | Admitting: Orthopedic Surgery

## 2021-04-18 ENCOUNTER — Other Ambulatory Visit: Payer: Self-pay

## 2021-04-18 ENCOUNTER — Ambulatory Visit (HOSPITAL_COMMUNITY)
Admission: RE | Admit: 2021-04-18 | Discharge: 2021-04-18 | Disposition: A | Discharge status: Home / Self Care | Payer: Medicare HMO | Source: Ambulatory Visit | Attending: Orthopedic Surgery | Admitting: Orthopedic Surgery

## 2021-04-18 DIAGNOSIS — C9030 Solitary plasmacytoma not having achieved remission: Secondary | ICD-10-CM

## 2021-04-22 DIAGNOSIS — M84521D Pathological fracture in neoplastic disease, right humerus, subsequent encounter for fracture with routine healing: Secondary | ICD-10-CM | POA: Diagnosis not present

## 2021-04-22 DIAGNOSIS — Z4789 Encounter for other orthopedic aftercare: Secondary | ICD-10-CM | POA: Diagnosis not present

## 2021-04-22 DIAGNOSIS — M84521A Pathological fracture in neoplastic disease, right humerus, initial encounter for fracture: Secondary | ICD-10-CM | POA: Diagnosis not present

## 2021-04-24 DIAGNOSIS — R7303 Prediabetes: Secondary | ICD-10-CM | POA: Diagnosis not present

## 2021-04-24 DIAGNOSIS — Z8616 Personal history of COVID-19: Secondary | ICD-10-CM | POA: Diagnosis not present

## 2021-04-24 DIAGNOSIS — C9000 Multiple myeloma not having achieved remission: Secondary | ICD-10-CM | POA: Diagnosis not present

## 2021-04-24 DIAGNOSIS — Z6834 Body mass index (BMI) 34.0-34.9, adult: Secondary | ICD-10-CM | POA: Diagnosis not present

## 2021-04-24 DIAGNOSIS — I10 Essential (primary) hypertension: Secondary | ICD-10-CM | POA: Diagnosis not present

## 2021-04-24 DIAGNOSIS — E6609 Other obesity due to excess calories: Secondary | ICD-10-CM | POA: Diagnosis not present

## 2021-04-24 DIAGNOSIS — F418 Other specified anxiety disorders: Secondary | ICD-10-CM | POA: Diagnosis not present

## 2021-04-24 DIAGNOSIS — M84521A Pathological fracture in neoplastic disease, right humerus, initial encounter for fracture: Secondary | ICD-10-CM | POA: Diagnosis not present

## 2021-04-24 DIAGNOSIS — Z7982 Long term (current) use of aspirin: Secondary | ICD-10-CM | POA: Diagnosis not present

## 2021-04-26 DIAGNOSIS — Z01810 Encounter for preprocedural cardiovascular examination: Secondary | ICD-10-CM | POA: Diagnosis not present

## 2021-04-29 ENCOUNTER — Inpatient Hospital Stay: Payer: Medicare HMO | Attending: Internal Medicine

## 2021-04-29 ENCOUNTER — Other Ambulatory Visit: Payer: Self-pay

## 2021-04-29 DIAGNOSIS — E785 Hyperlipidemia, unspecified: Secondary | ICD-10-CM | POA: Insufficient documentation

## 2021-04-29 DIAGNOSIS — M47812 Spondylosis without myelopathy or radiculopathy, cervical region: Secondary | ICD-10-CM | POA: Diagnosis not present

## 2021-04-29 DIAGNOSIS — K219 Gastro-esophageal reflux disease without esophagitis: Secondary | ICD-10-CM | POA: Insufficient documentation

## 2021-04-29 DIAGNOSIS — Z79899 Other long term (current) drug therapy: Secondary | ICD-10-CM | POA: Diagnosis not present

## 2021-04-29 DIAGNOSIS — Z7982 Long term (current) use of aspirin: Secondary | ICD-10-CM | POA: Insufficient documentation

## 2021-04-29 DIAGNOSIS — I10 Essential (primary) hypertension: Secondary | ICD-10-CM | POA: Diagnosis not present

## 2021-04-29 DIAGNOSIS — M47816 Spondylosis without myelopathy or radiculopathy, lumbar region: Secondary | ICD-10-CM | POA: Insufficient documentation

## 2021-04-29 DIAGNOSIS — M47814 Spondylosis without myelopathy or radiculopathy, thoracic region: Secondary | ICD-10-CM | POA: Diagnosis not present

## 2021-04-29 DIAGNOSIS — C9000 Multiple myeloma not having achieved remission: Secondary | ICD-10-CM | POA: Insufficient documentation

## 2021-04-29 DIAGNOSIS — C9030 Solitary plasmacytoma not having achieved remission: Secondary | ICD-10-CM

## 2021-04-29 LAB — CBC WITH DIFFERENTIAL (CANCER CENTER ONLY)
Abs Immature Granulocytes: 0.07 10*3/uL (ref 0.00–0.07)
Basophils Absolute: 0.1 10*3/uL (ref 0.0–0.1)
Basophils Relative: 1 %
Eosinophils Absolute: 0.2 10*3/uL (ref 0.0–0.5)
Eosinophils Relative: 3 %
HCT: 35.7 % — ABNORMAL LOW (ref 36.0–46.0)
Hemoglobin: 11.9 g/dL — ABNORMAL LOW (ref 12.0–15.0)
Immature Granulocytes: 1 %
Lymphocytes Relative: 26 %
Lymphs Abs: 1.6 10*3/uL (ref 0.7–4.0)
MCH: 31.3 pg (ref 26.0–34.0)
MCHC: 33.3 g/dL (ref 30.0–36.0)
MCV: 93.9 fL (ref 80.0–100.0)
Monocytes Absolute: 0.6 10*3/uL (ref 0.1–1.0)
Monocytes Relative: 10 %
Neutro Abs: 3.5 10*3/uL (ref 1.7–7.7)
Neutrophils Relative %: 59 %
Platelet Count: 217 10*3/uL (ref 150–400)
RBC: 3.8 MIL/uL — ABNORMAL LOW (ref 3.87–5.11)
RDW: 13.4 % (ref 11.5–15.5)
WBC Count: 6 10*3/uL (ref 4.0–10.5)
nRBC: 0 % (ref 0.0–0.2)

## 2021-04-29 LAB — CMP (CANCER CENTER ONLY)
ALT: 12 U/L (ref 0–44)
AST: 16 U/L (ref 15–41)
Albumin: 4.1 g/dL (ref 3.5–5.0)
Alkaline Phosphatase: 49 U/L (ref 38–126)
Anion gap: 6 (ref 5–15)
BUN: 19 mg/dL (ref 8–23)
CO2: 30 mmol/L (ref 22–32)
Calcium: 9.5 mg/dL (ref 8.9–10.3)
Chloride: 105 mmol/L (ref 98–111)
Creatinine: 1.08 mg/dL — ABNORMAL HIGH (ref 0.44–1.00)
GFR, Estimated: 55 mL/min — ABNORMAL LOW (ref >60)
Glucose, Bld: 100 mg/dL — ABNORMAL HIGH (ref 70–99)
Potassium: 3.3 mmol/L — ABNORMAL LOW (ref 3.5–5.1)
Sodium: 141 mmol/L (ref 135–145)
Total Bilirubin: 0.6 mg/dL (ref 0.3–1.2)
Total Protein: 7.9 g/dL (ref 6.5–8.1)

## 2021-04-29 LAB — LACTATE DEHYDROGENASE: LDH: 175 U/L (ref 98–192)

## 2021-04-30 LAB — IGG, IGA, IGM
IgA: 95 mg/dL (ref 87–352)
IgG (Immunoglobin G), Serum: 1969 mg/dL — ABNORMAL HIGH (ref 586–1602)
IgM (Immunoglobulin M), Srm: 65 mg/dL (ref 26–217)

## 2021-04-30 LAB — KAPPA/LAMBDA LIGHT CHAINS
Kappa free light chain: 103.4 mg/L — ABNORMAL HIGH (ref 3.3–19.4)
Kappa, lambda light chain ratio: 11.36 — ABNORMAL HIGH (ref 0.26–1.65)
Lambda free light chains: 9.1 mg/L (ref 5.7–26.3)

## 2021-04-30 LAB — BETA 2 MICROGLOBULIN, SERUM: Beta-2 Microglobulin: 1.9 mg/L (ref 0.6–2.4)

## 2021-05-01 DIAGNOSIS — Z8616 Personal history of COVID-19: Secondary | ICD-10-CM | POA: Diagnosis not present

## 2021-05-01 DIAGNOSIS — G8918 Other acute postprocedural pain: Secondary | ICD-10-CM | POA: Diagnosis not present

## 2021-05-01 DIAGNOSIS — M84421A Pathological fracture, right humerus, initial encounter for fracture: Secondary | ICD-10-CM | POA: Diagnosis not present

## 2021-05-01 DIAGNOSIS — C9000 Multiple myeloma not having achieved remission: Secondary | ICD-10-CM | POA: Diagnosis not present

## 2021-05-01 DIAGNOSIS — F418 Other specified anxiety disorders: Secondary | ICD-10-CM | POA: Diagnosis not present

## 2021-05-01 DIAGNOSIS — M84521A Pathological fracture in neoplastic disease, right humerus, initial encounter for fracture: Secondary | ICD-10-CM | POA: Diagnosis not present

## 2021-05-01 DIAGNOSIS — Z9889 Other specified postprocedural states: Secondary | ICD-10-CM | POA: Diagnosis not present

## 2021-05-01 DIAGNOSIS — Z7982 Long term (current) use of aspirin: Secondary | ICD-10-CM | POA: Diagnosis not present

## 2021-05-01 DIAGNOSIS — R7303 Prediabetes: Secondary | ICD-10-CM | POA: Diagnosis not present

## 2021-05-01 DIAGNOSIS — Z6834 Body mass index (BMI) 34.0-34.9, adult: Secondary | ICD-10-CM | POA: Diagnosis not present

## 2021-05-01 DIAGNOSIS — I10 Essential (primary) hypertension: Secondary | ICD-10-CM | POA: Diagnosis not present

## 2021-05-01 DIAGNOSIS — C4001 Malignant neoplasm of scapula and long bones of right upper limb: Secondary | ICD-10-CM | POA: Diagnosis not present

## 2021-05-01 DIAGNOSIS — E6609 Other obesity due to excess calories: Secondary | ICD-10-CM | POA: Diagnosis not present

## 2021-05-05 ENCOUNTER — Other Ambulatory Visit: Payer: Self-pay

## 2021-05-05 NOTE — Patient Outreach (Signed)
[facility]) Care Management  05/05/2021  Joanna Ray October 14, 1950 [id number]  Insurance referral received and assigned to [place], RN for today.  [name] [facility] Management Assistant [phone]

## 2021-05-05 NOTE — Patient Outreach (Signed)
[facility]) Care Management  05/05/2021  SHARREN SCHNURR [birth year] [id number]     Transition of Care Referral  Referral Date: 05/05/2021 Referral Source: Discharge Report Date of Discharge: 05/02/2021 Facility: [facility]     Outreach attempt # 1 to patient. Spoke with patient who was guarded with responses. She reports she is doing fairly well-s/p right humerus surgery. Pain is controlled with current regimen.  She has MD follow up appts in place.  She reports her sister is helping her out and will be taking her to appt. [facility] services reviewed and discussed. Patient does not want ongoing weekly TOC calls. States she is recovering and managing just fine at present. She was provided with contact info to call if anything changes.     Plan: RN CM will close referral.    Enzo Montgomery, RN,BSN,CCM [place] Management Telephonic Care Management Coordinator Direct Phone: [phone] Toll Free: [phone] Fax: [phone]

## 2021-05-06 ENCOUNTER — Other Ambulatory Visit: Payer: Self-pay

## 2021-05-06 ENCOUNTER — Inpatient Hospital Stay (HOSPITAL_BASED_OUTPATIENT_CLINIC_OR_DEPARTMENT_OTHER): Payer: Medicare HMO | Admitting: Internal Medicine

## 2021-05-06 VITALS — BP 166/88 | HR 67 | Temp 97.8°F | Resp 17 | Wt 210.7 lb

## 2021-05-06 DIAGNOSIS — I10 Essential (primary) hypertension: Secondary | ICD-10-CM | POA: Diagnosis not present

## 2021-05-06 DIAGNOSIS — Z7982 Long term (current) use of aspirin: Secondary | ICD-10-CM | POA: Diagnosis not present

## 2021-05-06 DIAGNOSIS — C9030 Solitary plasmacytoma not having achieved remission: Secondary | ICD-10-CM | POA: Diagnosis not present

## 2021-05-06 DIAGNOSIS — E785 Hyperlipidemia, unspecified: Secondary | ICD-10-CM | POA: Diagnosis not present

## 2021-05-06 DIAGNOSIS — Z79899 Other long term (current) drug therapy: Secondary | ICD-10-CM | POA: Diagnosis not present

## 2021-05-06 DIAGNOSIS — C9000 Multiple myeloma not having achieved remission: Secondary | ICD-10-CM | POA: Diagnosis not present

## 2021-05-06 DIAGNOSIS — M47814 Spondylosis without myelopathy or radiculopathy, thoracic region: Secondary | ICD-10-CM | POA: Diagnosis not present

## 2021-05-06 DIAGNOSIS — M47816 Spondylosis without myelopathy or radiculopathy, lumbar region: Secondary | ICD-10-CM | POA: Diagnosis not present

## 2021-05-06 DIAGNOSIS — K219 Gastro-esophageal reflux disease without esophagitis: Secondary | ICD-10-CM | POA: Diagnosis not present

## 2021-05-06 DIAGNOSIS — M47812 Spondylosis without myelopathy or radiculopathy, cervical region: Secondary | ICD-10-CM | POA: Diagnosis not present

## 2021-05-06 NOTE — Progress Notes (Signed)
[place] Telephone:(336) [phone]   Fax:(336) [phone]  OFFICE PROGRESS NOTE  [name], MD [address] 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  PRIOR THERAPY: Palliative radiotherapy to the plasmacytoma of the proximal right humerus under the care of Dr. Sondra Come.  CURRENT THERAPY: Status post tumor resection from the proximal humerus with reconstruction under the care of Dr. Mylo Red at [facility] on May 01, 2021.  INTERVAL HISTORY: KENZLEY KE 71 y.o. female returns to the clinic today for follow-up visit accompanied by her 2 sisters.  The patient is feeling fine today with no concerning complaints except for mild pain on the right arm.  She recently on May 01, 2021 underwent resection of the plasmacytoma from the proximal humerus with reconstruction at [facility] under the care of Dr. Janice Coffin.  She is recovering well from her surgery.  She denied having any current chest pain, shortness of breath, cough or hemoptysis.  She denied having any fever or chills.  She has no nausea, vomiting, diarrhea or constipation.  She has no headache or visual changes.  She has no recent weight loss or night sweats.  She had a myeloma panel performed recently and she is here for evaluation and discussion of her lab results.  MEDICAL HISTORY: Past Medical History:  Diagnosis Date   Anxiety    GERD (gastroesophageal reflux disease)    Hyperlipidemia    Hypertension    Urticaria     ALLERGIES:  is allergic to shellfish allergy.  MEDICATIONS:  Current Outpatient Medications  Medication Sig Dispense Refill   ALPRAZolam (XANAX) 0.5 MG tablet Take 1 tablet by mouth twice daily as needed for anxiety 60 tablet 5   aspirin 81 MG tablet Take 81 mg by mouth daily.     cholecalciferol (VITAMIN D) 1000 UNITS tablet Take 1,000 Units  by mouth daily.     EPINEPHrine 0.3 mg/0.3 mL IJ SOAJ injection Inject 0.3 mLs (0.3 mg total) into the muscle as needed for anaphylaxis. 1 each 5   escitalopram (LEXAPRO) 10 MG tablet 1 tab(s)     fexofenadine (ALLEGRA) 180 MG tablet Take 1 tablet (180 mg total) by mouth daily. 90 tablet 1   hydrocortisone valerate cream (WESTCORT) 0.2 % 1 app     oxyCODONE-acetaminophen (PERCOCET/ROXICET) 5-325 MG tablet Take 1 tablet by mouth every 8 (eight) hours as needed for severe pain. (Patient not taking: Reported on 03/26/2021) 10 tablet 0   rosuvastatin (CRESTOR) 20 MG tablet Take 1/2 (one-half) tablet by mouth once daily 45 tablet prn   traMADol (ULTRAM) 50 MG tablet Take 1 tablet (50 mg total) by mouth every 6 (six) hours as needed. 30 tablet 0   valsartan-hydrochlorothiazide (DIOVAN-HCT) 160-25 MG tablet Take 1 tablet by mouth once daily 90 tablet 1   vitamin E 100 UNIT capsule Take 100 Units by mouth daily.     No current facility-administered medications for this visit.    SURGICAL HISTORY:  Past Surgical History:  Procedure Laterality Date   CATARACT EXTRACTION, BILATERAL Bilateral    DILATION AND CURETTAGE OF UTERUS      REVIEW OF SYSTEMS:  A comprehensive review of systems was negative except for: Musculoskeletal: positive for bone pain   PHYSICAL EXAMINATION: General appearance: alert, cooperative, and no distress Head: Normocephalic, without obvious abnormality, atraumatic Neck: no adenopathy, no JVD, supple, symmetrical, trachea  midline, and thyroid not enlarged, symmetric, no tenderness/mass/nodules Lymph nodes: Cervical, supraclavicular, and axillary nodes normal. Resp: clear to auscultation bilaterally Back: symmetric, no curvature. ROM normal. No CVA tenderness. Cardio: regular rate and rhythm, S1, S2 normal, no murmur, click, rub or gallop GI: soft, non-tender; bowel sounds normal; no masses,  no organomegaly Extremities: No edema  ECOG PERFORMANCE STATUS: 1 - Symptomatic  but completely ambulatory  Blood pressure (!) 166/88, pulse 67, temperature 97.8 F (36.6 C), temperature source Tympanic, resp. rate 17, weight 210 lb 11.2 oz (95.6 kg), SpO2 96 %.  LABORATORY DATA: Lab Results  Component Value Date   WBC 6.0 04/29/2021   HGB 11.9 (L) 04/29/2021   HCT 35.7 (L) 04/29/2021   MCV 93.9 04/29/2021   PLT 217 04/29/2021      Chemistry      Component Value Date/Time   NA 141 04/29/2021 1517   NA 145 (H) 12/28/2018 1436   K 3.3 (L) 04/29/2021 1517   CL 105 04/29/2021 1517   CO2 30 04/29/2021 1517   BUN 19 04/29/2021 1517   BUN 15 12/28/2018 1436   CREATININE 1.08 (H) 04/29/2021 1517   CREATININE 1.08 (H) 03/07/2021 0919      Component Value Date/Time   CALCIUM 9.5 04/29/2021 1517   ALKPHOS 49 04/29/2021 1517   AST 16 04/29/2021 1517   ALT 12 04/29/2021 1517   BILITOT 0.6 04/29/2021 1517       RADIOGRAPHIC STUDIES: DG Bone Survey Met  Result Date: 04/18/2021 CLINICAL DATA:  Solitary plasmacytoma. EXAM: METASTATIC BONE SURVEY COMPARISON:  07/03/2020. FINDINGS: There is a lytic lesion in the proximal femoral neck/diaphysis on the right with cortical destruction and disruption, compatible with pathologic fracture with distraction of up to 2 cm. There is periosteal elevation along the lateral aspect of the proximal humeral shaft. No other lytic or destructive lesion is seen within the bones. Degenerative changes are present in the cervical, thoracic, and lumbar spine. No acute process is noted in the chest or abdomen. IMPRESSION: 1. Progression of lytic lesion in the proximal humeral diaphysis with pathologic fracture and distraction of approximally 2 cm. 2. No other new lytic or destructive lesion in the bones. Electronically Signed   By: Brett Fairy M.D.   On: 04/18/2021 23:13    ASSESSMENT AND PLAN: This is a very pleasant 71 years old African-American female with plasmacytoma of the proximal right humerus diagnosed in February 2022.  The patient  had extensive work-up for multiple myeloma including a bone marrow biopsy and aspirate that showed only 6% plasma cells.  Her protein studies still suspicious for underlying MGUS/multiple myeloma but the findings are not enough to call it multiple myeloma at this point. The patient underwent radiotherapy to the plasmacytoma of the proximal right humerus and tolerated the procedure fairly well.  She is currently on observation.  The skeletal bone survey showed no other lytic bone lesion except the proximal right humerus. The patient was treated with palliative radiotherapy in the past but recent skeletal bone survey showed progression of the lytic lesion in the proximal humeral diaphysis with pathologic fracture.  She underwent surgical resection of the tumor with reconstruction under the care of Dr. Mylo Red at [facility] on May 01, 2021.  The patient is recovering well from her surgery. She had repeat myeloma panel performed last week.  Her myeloma panel showed stable disease except for mild further increase in the free kappa light chain. I recommended for the patient to continue  on observation for now with repeat myeloma panel in 5 months. She was advised to call immediately if she has any other concerning symptoms in the interval The patient voices understanding of current disease status and treatment options and is in agreement with the current care plan.  All questions were answered. The patient knows to call the clinic with any problems, questions or concerns. We can certainly see the patient much sooner if necessary.   Disclaimer: This note was dictated with voice recognition software. Similar sounding words can inadvertently be transcribed and may not be corrected upon review.

## 2021-05-15 ENCOUNTER — Ambulatory Visit: Payer: PRIVATE HEALTH INSURANCE | Admitting: Cardiology

## 2021-05-22 ENCOUNTER — Other Ambulatory Visit: Payer: Medicare HMO

## 2021-06-24 DIAGNOSIS — M84521D Pathological fracture in neoplastic disease, right humerus, subsequent encounter for fracture with routine healing: Secondary | ICD-10-CM | POA: Diagnosis not present

## 2021-06-24 DIAGNOSIS — Z96611 Presence of right artificial shoulder joint: Secondary | ICD-10-CM | POA: Diagnosis not present

## 2021-06-24 DIAGNOSIS — Z471 Aftercare following joint replacement surgery: Secondary | ICD-10-CM | POA: Diagnosis not present

## 2021-06-24 DIAGNOSIS — C9030 Solitary plasmacytoma not having achieved remission: Secondary | ICD-10-CM | POA: Diagnosis not present

## 2021-07-15 ENCOUNTER — Encounter: Payer: Self-pay | Admitting: Physical Therapy

## 2021-07-15 ENCOUNTER — Other Ambulatory Visit: Payer: Self-pay

## 2021-07-15 ENCOUNTER — Ambulatory Visit (INDEPENDENT_AMBULATORY_CARE_PROVIDER_SITE_OTHER): Payer: Medicare HMO | Admitting: Physical Therapy

## 2021-07-15 DIAGNOSIS — M6281 Muscle weakness (generalized): Secondary | ICD-10-CM | POA: Diagnosis not present

## 2021-07-15 DIAGNOSIS — M25511 Pain in right shoulder: Secondary | ICD-10-CM

## 2021-07-15 DIAGNOSIS — R600 Localized edema: Secondary | ICD-10-CM | POA: Diagnosis not present

## 2021-07-15 NOTE — Therapy (Addendum)
?OUTPATIENT PHYSICAL THERAPY SHOULDER EVALUATION ? ? ?Patient Name: Joanna Ray ?MRN: [id number] ?DOB:12/21/1950, 72 y.o., female ?Today's Date: 07/15/2021 ? ? PT End of Session - 07/15/21 1059   ? ? Visit Number 1   ? Number of Visits 24   ? Date for PT Re-Evaluation 10/07/21   ? Authorization Type [employer] MCR   ? PT Start Time 1015   ? PT Stop Time 1058   ? PT Time Calculation (min) 43 min   ? Activity Tolerance Patient tolerated treatment well   ? ?  ?  ? ?  ? ? ?Past Medical History:  ?Diagnosis Date  ? Anxiety   ? GERD (gastroesophageal reflux disease)   ? Hyperlipidemia   ? Hypertension   ? Urticaria   ? ?Past Surgical History:  ?Procedure Laterality Date  ? CATARACT EXTRACTION, BILATERAL Bilateral   ? DILATION AND CURETTAGE OF UTERUS    ? ?Patient Active Problem List  ? Diagnosis Date Noted  ? Right shoulder pain 08/26/2020  ? Plasmacytoma ([place]) 06/28/2020  ? Angioedema 01/21/2019  ? Vertigo 09/28/2016  ? Hyperlipidemia 09/25/2011  ? Hypertension   ? ? ?PCP: [name], MD ? ?REFERRING PROVIDER: Janice Coffin, MD ? ?REFERRING DIAG: A41.660Y (ICD-10-CM) - Pathological fracture of right humerus in neoplastic disease ?C90.30 (ICD-10-CM) - Solitary plasmacytoma not having achieved remission ?Z96.611 (ICD-10-CM) - Presence of right artificial shoulder joint ? ?THERAPY DIAG:  ?Acute pain of right shoulder ? ?Muscle weakness (generalized) ? ?Localized edema ? ? ?ONSET DATE: 05/21/21 "S/P right proximal humerus replacement ? ?SUBJECTIVE:                                                                                                                                                                                     ? ?SUBJECTIVE STATEMENT: ?She reports she is still having some pain and weakness after her surgery, still cant drive yet.  ? ? ?PAIN:  ?Are you having pain? Yes: NPRS scale: 6/10 ?Pain location: Rt anterior shoulder ?Pain description: sharp, intermittent with movement ?Aggravating factors: getting  dressed, laying in bed turning over ?Relieving factors: meds, repositioning her shoulder across her chest ? ?PRECAUTIONS: S/P right proximal humerus replacement 05/01/21, start with PROM 0-90 forward flexion up to 120 by 6 weeks. Abd to 120, ER to 40, IR to 60. Add AROM to same limits. Strengthening to add at 3-4 weeks max lifting 15#" ? ?WEIGHT BEARING RESTRICTIONS None listed however limit weight bearing to Rt UE at this time due to post op status ? ?FALLS:  ?Has patient fallen in last 6 months? No Number of falls: 0 ? 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 ?  PLOF: Independent ? ?PATIENT GOALS : be able to comb and fix her hair and be able to raise her arm again ? ?OBJECTIVE:  ? ?DIAGNOSTIC FINDINGS:  ?XR in care everywhere section ? ?PATIENT SURVEYS:  ?FOTO 45% funcitonal, goal is 48% ? ?COGNITION: ? Overall cognitive status: Within functional limits for tasks assessed ?    ?SENSATION: ?Light touch: WFL ? ? ?UPPER EXTREMITY ROM:  ? ? AROM/PROM Right ?07/15/2021   ?Shoulder flexion 0/50   ?Shoulder extension 40/   ?Shoulder abduction 30/70   ?Shoulder adduction    ?Shoulder internal rotation 50/60 at 45 deg abd   ?Shoulder external rotation 10/25 at 45 deg abd   ?(Blank rows = not tested) ? ?UPPER EXTREMITY MMT: ? ?MMT Right ?07/15/2021   ?Shoulder flexion 2   ?Shoulder extension 3   ?Shoulder abduction 2   ?Shoulder adduction    ?Shoulder internal rotation 3   ?Shoulder external rotation 2   ?Middle trapezius    ?Lower trapezius    ?Elbow flexion    ?Elbow extension    ?Grip strength (lbs)    ?(Blank rows = not tested) ? ? ?PALPATION:  ?TTP Rt anterior shoulder ?  ?TODAY'S TREATMENT:  ?07/15/21: performed 5 reps ea of HEP listed below. PROM to Rt shoulder to her tolerance X 5 mi ? ? ?PATIENT EDUCATION: ?Education details: HEP,POC ?Person educated: Patient ?Education method: Explanation, Demonstration, Verbal cues, and Handouts ?Education comprehension: verbalized understanding, returned demonstration, and  needs further education ? ? ?HOME EXERCISE PROGRAM: ?Access Code: [email or web address] ?URL: [email or web address] ?Date: 07/15/2021 ?Prepared by: Elsie Ra ? ?Exercises ?Seated Shoulder Flexion Towel Slide at Table Top - 2 x daily - 6 x weekly - 1-2 sets - 10 reps ?Seated Shoulder External Rotation PROM on Table - 2 x daily - 6 x weekly - 1-2 sets - 10 reps - 5 hold ?Seated Shoulder Abduction Towel Slide at Table Top - 2 x daily - 6 x weekly - 1-2 sets - 10 reps - 5 hold ?Isometric Shoulder Flexion - 2 x daily - 6 x weekly - 1-2 sets - 10 reps - 5 hold ?Isometric Shoulder External Rotation - 2 x daily - 6 x weekly - 1-2 sets - 10 reps - 5 hold ?Isometric Shoulder Internal Rotation - 2 x daily - 6 x weekly - 1-2 sets - 10 reps - 5 hold ?Isometric Shoulder Abduction at Wall - 2 x daily - 6 x weekly - 1-2 sets - 10 reps - 5 hold ?Seated Scapular Retraction - 2 x daily - 6 x weekly - 2 sets - 10 reps - 5 sec hold ?Supine Shoulder Flexion AAROM with Hands Clasped - 2 x daily - 6 x weekly - 1-2 sets - 10 reps ? ? ? ? ?ASSESSMENT: ? ?CLINICAL IMPRESSION: Patient presents with Rt shoulder pain and weakness with ROM deficits S/P right proximal humerus replacement 05/01/21, "Strengthening to add at 3-4 weeks max lifting 15#". Patient will benefit from skilled PT to address below impairments and improve overall function. ? ?OBJECTIVE IMPAIRMENTS: decreased activity tolerance, decreased endurance, decreased mobility, decreased ROM, decreased strength, impaired flexibility, impaired LE use, postural dysfunction, and pain. ? ?ACTIVITY LIMITATIONS:  lifting, carry,  cleaning, dressing, bathing, driving, and or occupation ? ?REHAB POTENTIAL: Good ? ?CLINICAL DECISION MAKING: stable/uncomplicated ? ?EVALUATION COMPLEXITY: Low ? ? ? ?GOALS: ?Short term PT Goals Target date: 08/12/2021 ?Pt will be I and compliant with HEP. ?Baseline:  ?Goal status: New ?Pt will improve Rt shoulder abduction and  flexion PROM >90 deg ?Baseline: ?Goal  status: New ? ?Long term PT goals Target date: 10/07/2021 ?Pt will improve Rt shoulder ROM to [facility] to improve functional mobility ?Baseline: ?Goal status: New ?Pt will improve  Rt shoulder strength to at least 4/5 MMT to improve functional strength ?Baseline: ?Goal status: New ?Pt will improve FOTO to at least 48% functional to show improved function ?Baseline: ?Goal status: New ?Pt will reduce pain to overall less than 2-3/10 with usual activity and work activity. ?Baseline: ?Goal status: New ?Pt will be able to dress herself and fix her hair without more than mild complaints in Rt UE. ?  Goal status: New ?PLAN: ?PT FREQUENCY: 2 times per week  ? ?PT DURATION: 8-12 weeks ? ?PLANNED INTERVENTIONS (unless contraindicated): aquatic PT, Canalith repositioning, cryotherapy, Electrical stimulation, Iontophoresis with 4 mg/ml dexamethasome, Moist heat, traction, Ultrasound, gait training, Therapeutic exercise, balance training, neuromuscular re-education, patient/family education, prosthetic training, manual techniques, passive ROM, dry needling, taping, vasopnuematic device, vestibular, spinal manipulations, joint manipulations ? ?PLAN FOR NEXT SESSION: How was HEP? Needs ROM and beginning muscle activation Rt shoulder, 15# limit per MD for strengthening. ? ?Referring diagnosis? K99.833A (ICD-10-CM) - Pathological fracture of right humerus in neoplastic disease ?Treatment diagnosis? (if different than referring diagnosis) m25.511 ?What was this (referring dx) caused by? ?'[x]'$  Surgery ?'[]'$  Fall ?'[]'$  Ongoing issue ?'[x]'$  Arthritis ?'[]'$  Other: ____________ ? ?Laterality: ?'[x]'$  Rt ?'[]'$  Lt ?'[]'$  Both ? ?Check all possible CPT codes:  *CHOOSE 10 OR LESS*    ?'[x]'$  97110 (Therapeutic Exercise)  '[]'$  92507 (SLP Treatment)  ?'[x]'$  [id number] (Neuro Re-ed)   '[]'$  92526 (Swallowing Treatment)  ? '[]'$  [id number] (Gait Training)   '[]'$  [id number] (Cognitive Training, 1st 15 minutes) ?'[x]'$  97140 (Manual Therapy)   '[]'$  97130 (Cognitive Training, each add'l 15 minutes)  ?'[x]'$   [id number] (Re-evaluation)                              '[]'$  Other, List CPT Code ____________  ?'[x]'$  [phone] (Therapeutic Activities)     ?'[x]'$  97535 (Self Care)  ? '[]'$  All codes above (97110 - 97535) ? '[]'$  [id number]

## 2021-07-30 ENCOUNTER — Ambulatory Visit (INDEPENDENT_AMBULATORY_CARE_PROVIDER_SITE_OTHER): Payer: Medicare HMO | Admitting: Physical Therapy

## 2021-07-30 ENCOUNTER — Encounter: Payer: Self-pay | Admitting: Physical Therapy

## 2021-07-30 DIAGNOSIS — R600 Localized edema: Secondary | ICD-10-CM | POA: Diagnosis not present

## 2021-07-30 DIAGNOSIS — M6281 Muscle weakness (generalized): Secondary | ICD-10-CM | POA: Diagnosis not present

## 2021-07-30 DIAGNOSIS — M25511 Pain in right shoulder: Secondary | ICD-10-CM

## 2021-07-30 NOTE — Therapy (Signed)
?OUTPATIENT PHYSICAL THERAPY TREATMENT NOTE ? ? ?Patient Name: Joanna Ray ?MRN: [id number] ?DOB:10/28/1950, 71 y.o., female ?Today's Date: 07/30/2021 ? ?PCP: [name], MD ?REFERRING PROVIDER: Janice Coffin, MD ? ?END OF SESSION:  ? PT End of Session - 07/30/21 1519   ? ? Visit Number 2   ? Number of Visits 24   ? Date for PT Re-Evaluation 10/07/21   ? Authorization Type [employer] MCR   ? PT Start Time 1515   ? PT Stop Time 1062   ? PT Time Calculation (min) 40 min   ? Activity Tolerance Patient tolerated treatment well   ? ?  ?  ? ?  ? ? ?Past Medical History:  ?Diagnosis Date  ? Anxiety   ? GERD (gastroesophageal reflux disease)   ? Hyperlipidemia   ? Hypertension   ? Urticaria   ? ?Past Surgical History:  ?Procedure Laterality Date  ? CATARACT EXTRACTION, BILATERAL Bilateral   ? DILATION AND CURETTAGE OF UTERUS    ? ?Patient Active Problem List  ? Diagnosis Date Noted  ? Right shoulder pain 08/26/2020  ? Plasmacytoma ([place]) 06/28/2020  ? Angioedema 01/21/2019  ? Vertigo 09/28/2016  ? Hyperlipidemia 09/25/2011  ? Hypertension   ? ? ? ?THERAPY DIAG:  ?Acute pain of right shoulder ? ?Muscle weakness (generalized) ? ?Localized edema ? ?Right shoulder pain, unspecified chronicity ? ? ? ?PCP: [name], MD ?  ?REFERRING PROVIDER: Janice Coffin, MD ?  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  ?  ?ONSET DATE: 05/21/21 S/P right proximal humerus replacement ?  ?SUBJECTIVE:                                                                                                                                                                                     ?  ?SUBJECTIVE STATEMENT: ?She reports her shoulder is doing ok, she has been trying to do her HEP ?  ?  ?PAIN:  ?Are you having pain? Yes: NPRS scale: 5/10 ?Pain  location: Rt anterior shoulder ?Pain description: sharp, intermittent with movement ?Aggravating factors: getting dressed, laying in bed turning over ?Relieving factors: meds, repositioning her shoulder across her chest ?  ?PRECAUTIONS: S/P right proximal humerus replacement 05/01/21, start with PROM 0-90 forward flexion up to 120 by 6 weeks. Abd to 120, ER to 40, IR to 60. Add AROM to same limits. Strengthening to add at 3-4 weeks max lifting 15#" ?  ?WEIGHT BEARING RESTRICTIONS None listed however limit weight bearing to Rt UE at  this time due to post op status ? 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  ?PLOF: Independent ?  ?PATIENT GOALS : be able to comb and fix her hair and be able to raise her arm again ?  ?OBJECTIVE:  ?  ?DIAGNOSTIC FINDINGS:  ?XR in care everywhere section ?  ?PATIENT SURVEYS:  ?FOTO 45% funcitonal, goal is 48% ? ?  ?UPPER EXTREMITY ROM:  ?  ? AROM/PROM Right ?07/15/2021    ?Shoulder flexion 0/50    ?Shoulder extension 40/    ?Shoulder abduction 30/70    ?Shoulder adduction      ?Shoulder internal rotation 50/60 at 45 deg abd    ?Shoulder external rotation 10/25 at 45 deg abd    ?(Blank rows = not tested) ?  ?UPPER EXTREMITY MMT: ?  ?MMT Right ?07/15/2021    ?Shoulder flexion 2    ?Shoulder extension 3    ?Shoulder abduction 2    ?Shoulder adduction      ?Shoulder internal rotation 3    ?Shoulder external rotation 2    ?Middle trapezius      ?Lower trapezius      ?Elbow flexion      ?Elbow extension      ?Grip strength (lbs)      ?(Blank rows = not tested) ?  ?  ?PALPATION:  ?07/15/21: TTP Rt anterior shoulder ?            ?TODAY'S TREATMENT:  ?07/30/21 ?UBE no resistance 2 min fwd, 2 min reverse ?Pulleys 2 min flexion, 2 min abd ?Standing pendumums Rt shoulder X15 each for CW,CCW, A-P, lateral ?Standing scapular retractions 5 seconds 2X10 ?Seated Rt Shoulder AAROM tableslide flexion, abduction,ER 5 sec holds X10 ea ?Standing AAROM into Rt shoulder flexion with hands clasped 5 sec  X10 ?Doorway isometrics for Rt shoulder IR,ER,abd,flexion 5 sec X10 ea ?Vasopnuematic to Rt shoulder X10 minutes lowest temperature low compression ? ? ? ?07/15/21: performed 5 reps ea of HEP listed below. PROM to Rt shoulder to her tolerance X 5 mi ?  ?  ?PATIENT EDUCATION: ?07/15/21 ?Education details: HEP,POC ?Person educated: Patient ?Education method: Explanation, Demonstration, Verbal cues, and Handouts ?Education comprehension: verbalized understanding, returned demonstration, and needs further education ?  ?  ?HOME EXERCISE PROGRAM: ?Access Code: [email or web address] ?URL: [email or web address] ?Date: 07/15/2021 ?Prepared by: Elsie Ra ?  ?Exercises ?Seated Shoulder Flexion Towel Slide at Table Top - 2 x daily - 6 x weekly - 1-2 sets - 10 reps ?Seated Shoulder External Rotation PROM on Table - 2 x daily - 6 x weekly - 1-2 sets - 10 reps - 5 hold ?Seated Shoulder Abduction Towel Slide at Table Top - 2 x daily - 6 x weekly - 1-2 sets - 10 reps - 5 hold ?Isometric Shoulder Flexion - 2 x daily - 6 x weekly - 1-2 sets - 10 reps - 5 hold ?Isometric Shoulder External Rotation - 2 x daily - 6 x weekly - 1-2 sets - 10 reps - 5 hold ?Isometric Shoulder Internal Rotation - 2 x daily - 6 x weekly - 1-2 sets - 10 reps - 5 hold ?Isometric Shoulder Abduction at Wall - 2 x daily - 6 x weekly - 1-2 sets - 10 reps - 5 hold ?Seated Scapular Retraction - 2 x daily - 6 x weekly - 2 sets - 10 reps - 5 sec hold ?Supine Shoulder Flexion AAROM with Hands Clasped - 2 x daily - 6 x weekly - 1-2 sets - 10  reps ?  ?  ?  ?  ?ASSESSMENT: ?  ?CLINICAL IMPRESSION: Session focused on improving overall ROM with PROM and AAROM exercises as well as deltoid and RTC activation with isometrics to her tolerance. She is very limited in these areas and will continue to benefit from skilled PT.  ?  ?OBJECTIVE IMPAIRMENTS: decreased activity tolerance, decreased endurance, decreased mobility, decreased ROM, decreased strength, impaired flexibility,  impaired LE use, postural dysfunction, and pain. ?  ?ACTIVITY LIMITATIONS:  lifting, carry,  cleaning, dressing, bathing, driving, and or occupation ?  ?REHAB POTENTIAL: Good ?  ?CLINICAL DECISION MAKING: stable/uncomplicated ?  ?EVALUATION COMPLEXITY: Low ?  ?  ?  ?GOALS: ?Short term PT Goals Target date: 08/12/2021 ?Pt will be I and compliant with HEP. ?Baseline:  ?Goal status: New ?Pt will improve Rt shoulder abduction and flexion PROM >90 deg ?Baseline: ?Goal status: New ?  ?Long term PT goals Target date: 10/07/2021 ?Pt will improve Rt shoulder ROM to [facility] to improve functional mobility ?Baseline: ?Goal status: New ?Pt will improve  Rt shoulder strength to at least 4/5 MMT to improve functional strength ?Baseline: ?Goal status: New ?Pt will improve FOTO to at least 48% functional to show improved function ?Baseline: ?Goal status: New ?Pt will reduce pain to overall less than 2-3/10 with usual activity and work activity. ?Baseline: ?Goal status: New ?Pt will be able to dress herself and fix her hair without more than mild complaints in Rt UE. ?                 Goal status: New ?PLAN: ?PT FREQUENCY: 2 times per week  ?  ?PT DURATION: 8-12 weeks ?  ?PLANNED INTERVENTIONS (unless contraindicated): aquatic PT, Canalith repositioning, cryotherapy, Electrical stimulation, Iontophoresis with 4 mg/ml dexamethasome, Moist heat, traction, Ultrasound, gait training, Therapeutic exercise, balance training, neuromuscular re-education, patient/family education, prosthetic training, manual techniques, passive ROM, dry needling, taping, vasopnuematic device, vestibular, spinal manipulations, joint manipulations ?  ?PLAN FOR NEXT SESSION: Needs ROM and beginning muscle activation Rt shoulder, 15# limit per MD for strengthening. Vaso if desired ? ?Debbe Odea, PT,DPT ?07/30/2021, 3:20 PM ? ?  ? ?

## 2021-07-31 ENCOUNTER — Ambulatory Visit (INDEPENDENT_AMBULATORY_CARE_PROVIDER_SITE_OTHER): Payer: Medicare HMO | Admitting: Physical Therapy

## 2021-07-31 ENCOUNTER — Encounter: Payer: Self-pay | Admitting: Physical Therapy

## 2021-07-31 DIAGNOSIS — R600 Localized edema: Secondary | ICD-10-CM

## 2021-07-31 DIAGNOSIS — M25511 Pain in right shoulder: Secondary | ICD-10-CM

## 2021-07-31 DIAGNOSIS — M6281 Muscle weakness (generalized): Secondary | ICD-10-CM

## 2021-07-31 NOTE — Therapy (Signed)
?OUTPATIENT PHYSICAL THERAPY TREATMENT NOTE ? ? ?Patient Name: Joanna Ray ?MRN: [id number] ?DOB:03-Jan-1951, 71 y.o., female ?Today's Date: 07/31/2021 ? ?PCP: [name], MD ?REFERRING PROVIDER: Janice Coffin, MD ? ?END OF SESSION:  ? PT End of Session - 07/31/21 1054   ? ? Visit Number 3   ? Number of Visits 24   ? Date for PT Re-Evaluation 10/07/21   ? Authorization Type [employer] MCR   ? PT Start Time 1015   ? PT Stop Time 1100   ? PT Time Calculation (min) 45 min   ? Activity Tolerance Patient tolerated treatment well   ? ?  ?  ? ?  ? ? ? ?Past Medical History:  ?Diagnosis Date  ? Anxiety   ? GERD (gastroesophageal reflux disease)   ? Hyperlipidemia   ? Hypertension   ? Urticaria   ? ?Past Surgical History:  ?Procedure Laterality Date  ? CATARACT EXTRACTION, BILATERAL Bilateral   ? DILATION AND CURETTAGE OF UTERUS    ? ?Patient Active Problem List  ? Diagnosis Date Noted  ? Right shoulder pain 08/26/2020  ? Plasmacytoma ([place]) 06/28/2020  ? Angioedema 01/21/2019  ? Vertigo 09/28/2016  ? Hyperlipidemia 09/25/2011  ? Hypertension   ? ? ? ?THERAPY DIAG:  ?Acute pain of right shoulder ? ?Muscle weakness (generalized) ? ?Localized edema ? ?Right shoulder pain, unspecified chronicity ? ? ? ?PCP: [name], MD ?  ?REFERRING PROVIDER: Janice Coffin, MD ?  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  ?  ?ONSET DATE: 05/21/21 S/P right proximal humerus replacement ?  ?SUBJECTIVE:                                                                                                                                                                                     ?  ?SUBJECTIVE STATEMENT: ?She reports her shoulder is stiff today ?  ?  ?PAIN:  ?Are you having pain? Yes: NPRS scale: 5/10 ?Pain location: Rt anterior  shoulder ?Pain description: sharp, intermittent with movement ?Aggravating factors: getting dressed, laying in bed turning over ?Relieving factors: meds, repositioning her shoulder across her chest ?  ?PRECAUTIONS: S/P right proximal humerus replacement 05/01/21, start with PROM 0-90 forward flexion up to 120 by 6 weeks. Abd to 120, ER to 40, IR to 60. Add AROM to same limits. Strengthening to add at 3-4 weeks max lifting 15#" ?  ?WEIGHT BEARING RESTRICTIONS None listed however limit weight bearing to Rt UE at this time due to post op status ? ?  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  ?PLOF: Independent ?  ?PATIENT GOALS : be able to comb and fix her hair and be able to raise her arm again ?  ?OBJECTIVE:  ?  ?DIAGNOSTIC FINDINGS:  ?XR in care everywhere section ?  ?PATIENT SURVEYS:  ?FOTO 45% funcitonal, goal is 48% ? ?  ?UPPER EXTREMITY ROM:  ?  ? AROM/PROM Right ?07/15/2021    ?Shoulder flexion 0/50    ?Shoulder extension 40/    ?Shoulder abduction 30/70    ?Shoulder adduction      ?Shoulder internal rotation 50/60 at 45 deg abd    ?Shoulder external rotation 10/25 at 45 deg abd    ?(Blank rows = not tested) ?  ?UPPER EXTREMITY MMT: ?  ?MMT Right ?07/15/2021    ?Shoulder flexion 2    ?Shoulder extension 3    ?Shoulder abduction 2    ?Shoulder adduction      ?Shoulder internal rotation 3    ?Shoulder external rotation 2    ?Middle trapezius      ?Lower trapezius      ?Elbow flexion      ?Elbow extension      ?Grip strength (lbs)      ?(Blank rows = not tested) ?  ?  ?PALPATION:  ?07/15/21: TTP Rt anterior shoulder ?            ?TODAY'S TREATMENT:  ?07/31/21 ?UBE no resistance 2 min fwd, 2 min reverse ?Pulleys 3 min flexion, 3 min abd ?Standing pendumums Rt shoulder X15 each for CW,CCW, A-P, lateral ?Standing scapular retractions 5 seconds 2X10 ?Seated Rt Shoulder AAROM tableslide flexion, abduction,ER 5 sec holds X10 ea ?Standing AAROM into Rt shoulder flexion with hands clasped 5 sec 2X10 ?Doorway isometrics  for Rt shoulder IR,ER,abd,flexion 5 sec X10 ea ?Vasopnuematic to Rt shoulder X10 minutes lowest temperature low compression ? ?07/30/21 ?UBE no resistance 2 min fwd, 2 min reverse ?Pulleys 2 min flexion, 2 min abd ?Standing pendumums Rt shoulder X15 each for CW,CCW, A-P, lateral ?Standing scapular retractions 5 seconds 2X10 ?Seated Rt Shoulder AAROM tableslide flexion, abduction,ER 5 sec holds X10 ea ?Standing AAROM into Rt shoulder flexion with hands clasped 5 sec X10 ?Doorway isometrics for Rt shoulder IR,ER,abd,flexion 5 sec X10 ea ?Vasopnuematic to Rt shoulder X10 minutes lowest temperature low compression ? ? ? ?07/15/21: performed 5 reps ea of HEP listed below. PROM to Rt shoulder to her tolerance X 5 mi ?  ?  ?PATIENT EDUCATION: ?07/15/21 ?Education details: HEP,POC ?Person educated: Patient ?Education method: Explanation, Demonstration, Verbal cues, and Handouts ?Education comprehension: verbalized understanding, returned demonstration, and needs further education ?  ?  ?HOME EXERCISE PROGRAM: ?Access Code: [email or web address] ?URL: [email or web address] ?Date: 07/15/2021 ?Prepared by: Elsie Ra ?  ?Exercises ?Seated Shoulder Flexion Towel Slide at Table Top - 2 x daily - 6 x weekly - 1-2 sets - 10 reps ?Seated Shoulder External Rotation PROM on Table - 2 x daily - 6 x weekly - 1-2 sets - 10 reps - 5 hold ?Seated Shoulder Abduction Towel Slide at Table Top - 2 x daily - 6 x weekly - 1-2 sets - 10 reps - 5 hold ?Isometric Shoulder Flexion - 2 x daily - 6 x weekly - 1-2 sets - 10 reps - 5 hold ?Isometric Shoulder External Rotation - 2 x daily - 6 x weekly - 1-2 sets - 10 reps - 5 hold ?Isometric Shoulder Internal Rotation - 2 x daily - 6 x weekly - 1-2  sets - 10 reps - 5 hold ?Isometric Shoulder Abduction at Wall - 2 x daily - 6 x weekly - 1-2 sets - 10 reps - 5 hold ?Seated Scapular Retraction - 2 x daily - 6 x weekly - 2 sets - 10 reps - 5 sec hold ?Supine Shoulder Flexion AAROM with Hands Clasped - 2 x  daily - 6 x weekly - 1-2 sets - 10 reps ?  ?  ?  ?  ?ASSESSMENT: ?  ?CLINICAL IMPRESSION: She was able to tolerate slight progression in her AAROM exercises today. We will continue to work to improve her impairments with this in PT and with HEP. ?  ?OBJECTIVE IMPAIRMENTS: decreased activity tolerance, decreased endurance, decreased mobility, decreased ROM, decreased strength, impaired flexibility, impaired LE use, postural dysfunction, and pain. ?  ?ACTIVITY LIMITATIONS:  lifting, carry,  cleaning, dressing, bathing, driving, and or occupation ?  ?REHAB POTENTIAL: Good ?  ?CLINICAL DECISION MAKING: stable/uncomplicated ?  ?EVALUATION COMPLEXITY: Low ?  ?  ?  ?GOALS: ?Short term PT Goals Target date: 08/12/2021 ?Pt will be I and compliant with HEP. ?Baseline:  ?Goal status: New ?Pt will improve Rt shoulder abduction and flexion PROM >90 deg ?Baseline: ?Goal status: New ?  ?Long term PT goals Target date: 10/07/2021 ?Pt will improve Rt shoulder ROM to [facility] to improve functional mobility ?Baseline: ?Goal status: New ?Pt will improve  Rt shoulder strength to at least 4/5 MMT to improve functional strength ?Baseline: ?Goal status: New ?Pt will improve FOTO to at least 48% functional to show improved function ?Baseline: ?Goal status: New ?Pt will reduce pain to overall less than 2-3/10 with usual activity and work activity. ?Baseline: ?Goal status: New ?Pt will be able to dress herself and fix her hair without more than mild complaints in Rt UE. ?                 Goal status: New ?PLAN: ?PT FREQUENCY: 2 times per week  ?  ?PT DURATION: 8-12 weeks ?  ?PLANNED INTERVENTIONS (unless contraindicated): aquatic PT, Canalith repositioning, cryotherapy, Electrical stimulation, Iontophoresis with 4 mg/ml dexamethasome, Moist heat, traction, Ultrasound, gait training, Therapeutic exercise, balance training, neuromuscular re-education, patient/family education, prosthetic training, manual techniques, passive ROM, dry needling, taping,  vasopnuematic device, vestibular, spinal manipulations, joint manipulations ?  ?PLAN FOR NEXT SESSION: Needs ROM and beginning muscle activation Rt shoulder, 15# limit per MD for strengthening. Vaso if desired ?

## 2021-08-05 ENCOUNTER — Encounter: Payer: Self-pay | Admitting: Physical Therapy

## 2021-08-05 ENCOUNTER — Ambulatory Visit (INDEPENDENT_AMBULATORY_CARE_PROVIDER_SITE_OTHER): Payer: Medicare HMO | Admitting: Physical Therapy

## 2021-08-05 DIAGNOSIS — M6281 Muscle weakness (generalized): Secondary | ICD-10-CM

## 2021-08-05 DIAGNOSIS — R600 Localized edema: Secondary | ICD-10-CM

## 2021-08-05 DIAGNOSIS — M25511 Pain in right shoulder: Secondary | ICD-10-CM

## 2021-08-05 NOTE — Therapy (Signed)
?OUTPATIENT PHYSICAL THERAPY TREATMENT NOTE ? ? ?Patient Name: Joanna Ray ?MRN: [id number] ?DOB:08-05-1950, 71 y.o., female ?Today's Date: 08/05/2021 ? ?PCP: [name], MD ?REFERRING PROVIDER: Janice Coffin, MD ? ?END OF SESSION:  ? PT End of Session - 08/05/21 0937   ? ? Visit Number 4   ? Number of Visits 24   ? Date for PT Re-Evaluation 10/07/21   ? Authorization Type [employer] MCR   ? PT Start Time 0930   ? PT Stop Time 1015   ? PT Time Calculation (min) 45 min   ? Activity Tolerance Patient tolerated treatment well   ? ?  ?  ? ?  ? ? ? ?Past Medical History:  ?Diagnosis Date  ? Anxiety   ? GERD (gastroesophageal reflux disease)   ? Hyperlipidemia   ? Hypertension   ? Urticaria   ? ?Past Surgical History:  ?Procedure Laterality Date  ? CATARACT EXTRACTION, BILATERAL Bilateral   ? DILATION AND CURETTAGE OF UTERUS    ? ?Patient Active Problem List  ? Diagnosis Date Noted  ? Right shoulder pain 08/26/2020  ? Plasmacytoma ([place]) 06/28/2020  ? Angioedema 01/21/2019  ? Vertigo 09/28/2016  ? Hyperlipidemia 09/25/2011  ? Hypertension   ? ? ? ?THERAPY DIAG:  ?Acute pain of right shoulder ? ?Muscle weakness (generalized) ? ?Localized edema ? ?Right shoulder pain, unspecified chronicity ? ? ? ?PCP: [name], MD ?  ?REFERRING PROVIDER: Janice Coffin, MD ?  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  ?  ?ONSET DATE: 05/21/21 S/P right proximal humerus replacement ?  ?SUBJECTIVE:                                                                                                                                                                                     ?  ?SUBJECTIVE STATEMENT: ?She reports her shoulder feels better today ?  ?  ?PAIN:  ?Are you having pain? Yes: NPRS scale: 3/10 ?Pain location: Rt anterior  shoulder ?Pain description: sharp, intermittent with movement ?Aggravating factors: getting dressed, laying in bed turning over ?Relieving factors: meds, repositioning her shoulder across her chest ?  ?PRECAUTIONS: S/P right proximal humerus replacement 05/01/21, start with PROM 0-90 forward flexion up to 120 by 6 weeks. Abd to 120, ER to 40, IR to 60. Add AROM to same limits. Strengthening to add at 3-4 weeks max lifting 15#" ?  ?WEIGHT BEARING RESTRICTIONS None listed however limit weight bearing to Rt UE at this time due to post op status ? ?  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  ?PLOF: Independent ?  ?PATIENT GOALS : be able to comb and fix her hair and be able to raise her arm again ?  ?OBJECTIVE:  ?  ?DIAGNOSTIC FINDINGS:  ?XR in care everywhere section ?  ?PATIENT SURVEYS:  ?FOTO 45% funcitonal, goal is 48% ? ?  ?UPPER EXTREMITY ROM:  ?  ? AROM/PROM Right ?07/15/2021    ?Shoulder flexion 0/50    ?Shoulder extension 40/    ?Shoulder abduction 30/70    ?Shoulder adduction      ?Shoulder internal rotation 50/60 at 45 deg abd    ?Shoulder external rotation 10/25 at 45 deg abd    ?(Blank rows = not tested) ?  ?UPPER EXTREMITY MMT: ?  ?MMT Right ?07/15/2021    ?Shoulder flexion 2    ?Shoulder extension 3    ?Shoulder abduction 2    ?Shoulder adduction      ?Shoulder internal rotation 3    ?Shoulder external rotation 2    ?Middle trapezius      ?Lower trapezius      ?Elbow flexion      ?Elbow extension      ?Grip strength (lbs)      ?(Blank rows = not tested) ?  ?  ?PALPATION:  ?07/15/21: TTP Rt anterior shoulder ?            ?TODAY'S TREATMENT:  ?08/05/21 ?UBE no resistance 3 min fwd, 3 min reverse ?Pulleys 3 min flexion, 3 min abd ?Standing Wall ladder X 10 flexion and X 10 scaption holding 5 sec ?Standing scapular retractions 5 seconds 2X10 ?Standing AAROM into Rt shoulder flexion with hands clasped 5 sec 2X10 ?Doorway isometrics for Rt shoulder IR,ER,abd,flexion 5 sec X10 ea ?Vasopnuematic to Rt shoulder  X10 minutes lowest temperature low compression ? ?07/31/21 ?UBE no resistance 2 min fwd, 2 min reverse ?Pulleys 3 min flexion, 3 min abd ?Standing pendumums Rt shoulder X15 each for CW,CCW, A-P, lateral ?Standing scapular retractions 5 seconds 2X10 ?Seated Rt Shoulder AAROM tableslide flexion, abduction,ER 5 sec holds X10 ea ?Standing AAROM into Rt shoulder flexion with hands clasped 5 sec 2X10 ?Doorway isometrics for Rt shoulder IR,ER,abd,flexion 5 sec X10 ea ?Vasopnuematic to Rt shoulder X10 minutes lowest temperature low compression ? ?  ?PATIENT EDUCATION: ?07/15/21 ?Education details: HEP,POC ?Person educated: Patient ?Education method: Explanation, Demonstration, Verbal cues, and Handouts ?Education comprehension: verbalized understanding, returned demonstration, and needs further education ?  ?  ?HOME EXERCISE PROGRAM: ?Access Code: [email or web address] ?URL: [email or web address] ?Date: 07/15/2021 ?Prepared by: Elsie Ra ?  ?Exercises ?Seated Shoulder Flexion Towel Slide at Table Top - 2 x daily - 6 x weekly - 1-2 sets - 10 reps ?Seated Shoulder External Rotation PROM on Table - 2 x daily - 6 x weekly - 1-2 sets - 10 reps - 5 hold ?Seated Shoulder Abduction Towel Slide at Table Top - 2 x daily - 6 x weekly - 1-2 sets - 10 reps - 5 hold ?Isometric Shoulder Flexion - 2 x daily - 6 x weekly - 1-2 sets - 10 reps - 5 hold ?Isometric Shoulder External Rotation - 2 x daily - 6 x weekly - 1-2 sets - 10 reps - 5 hold ?Isometric Shoulder Internal Rotation - 2 x daily - 6 x weekly - 1-2 sets - 10 reps - 5 hold ?Isometric Shoulder Abduction at Wall - 2 x daily - 6 x weekly - 1-2 sets - 10 reps - 5 hold ?Seated Scapular Retraction -  2 x daily - 6 x weekly - 2 sets - 10 reps - 5 sec hold ?Supine Shoulder Flexion AAROM with Hands Clasped - 2 x daily - 6 x weekly - 1-2 sets - 10 reps ?  ?  ?  ?  ?ASSESSMENT: ?  ?CLINICAL IMPRESSION: She is making slow but steady progress with her Rt shoulder. Able to progress to wall  ladder exercise today working on improving ROM. Her pain levels and activity tolerance were better overall today. She will continue to need PT.  ? ?OBJECTIVE IMPAIRMENTS: decreased activity tolerance, decreased endurance, decreased mobility, decreased ROM, decreased strength, impaired flexibility, impaired LE use, postural dysfunction, and pain. ?  ?ACTIVITY LIMITATIONS:  lifting, carry,  cleaning, dressing, bathing, driving, and or occupation ?  ?REHAB POTENTIAL: Good ?  ?CLINICAL DECISION MAKING: stable/uncomplicated ?  ?EVALUATION COMPLEXITY: Low ?  ?  ?  ?GOALS: ?Short term PT Goals Target date: 08/12/2021 ?Pt will be I and compliant with HEP. ?Baseline:  ?Goal status: New ?Pt will improve Rt shoulder abduction and flexion PROM >90 deg ?Baseline: ?Goal status: New ?  ?Long term PT goals Target date: 10/07/2021 ?Pt will improve Rt shoulder ROM to [facility] to improve functional mobility ?Baseline: ?Goal status: New ?Pt will improve  Rt shoulder strength to at least 4/5 MMT to improve functional strength ?Baseline: ?Goal status: New ?Pt will improve FOTO to at least 48% functional to show improved function ?Baseline: ?Goal status: New ?Pt will reduce pain to overall less than 2-3/10 with usual activity and work activity. ?Baseline: ?Goal status: New ?Pt will be able to dress herself and fix her hair without more than mild complaints in Rt UE. ?                 Goal status: New ?PLAN: ?PT FREQUENCY: 2 times per week  ?  ?PT DURATION: 8-12 weeks ?  ?PLANNED INTERVENTIONS (unless contraindicated): aquatic PT, Canalith repositioning, cryotherapy, Electrical stimulation, Iontophoresis with 4 mg/ml dexamethasome, Moist heat, traction, Ultrasound, gait training, Therapeutic exercise, balance training, neuromuscular re-education, patient/family education, prosthetic training, manual techniques, passive ROM, dry needling, taping, vasopnuematic device, vestibular, spinal manipulations, joint manipulations ?  ?PLAN FOR NEXT SESSION:  Needs ROM and beginning muscle activation Rt shoulder, 15# limit per MD for strengthening. Vaso if desired ? ?Debbe Odea, PT,DPT ?08/05/2021, 9:38 AM ? ?  ? ?

## 2021-08-07 ENCOUNTER — Encounter: Payer: Self-pay | Admitting: Physical Therapy

## 2021-08-07 ENCOUNTER — Other Ambulatory Visit: Payer: Self-pay | Admitting: Internal Medicine

## 2021-08-07 ENCOUNTER — Ambulatory Visit (INDEPENDENT_AMBULATORY_CARE_PROVIDER_SITE_OTHER): Payer: Medicare HMO | Admitting: Physical Therapy

## 2021-08-07 DIAGNOSIS — M6281 Muscle weakness (generalized): Secondary | ICD-10-CM

## 2021-08-07 DIAGNOSIS — R600 Localized edema: Secondary | ICD-10-CM

## 2021-08-07 DIAGNOSIS — M25511 Pain in right shoulder: Secondary | ICD-10-CM

## 2021-08-07 NOTE — Therapy (Signed)
?OUTPATIENT PHYSICAL THERAPY TREATMENT NOTE ? ? ?Patient Name: Joanna Ray ?MRN: [id number] ?DOB:[birth year], 71 y.o., female ?Today's Date: 08/07/2021 ? ?PCP: [name], MD ?REFERRING PROVIDER: Janice Coffin, MD ? ?END OF SESSION:  ? PT End of Session - 08/07/21 1025   ? ? Visit Number 5   ? Number of Visits 24   ? Date for PT Re-Evaluation 10/07/21   ? Authorization Type [employer] MCR   ? PT Start Time 1015   ? PT Stop Time 1100   ? PT Time Calculation (min) 45 min   ? Activity Tolerance Patient tolerated treatment well   ? ?  ?  ? ?  ? ? ? ?Past Medical History:  ?Diagnosis Date  ? Anxiety   ? GERD (gastroesophageal reflux disease)   ? Hyperlipidemia   ? Hypertension   ? Urticaria   ? ?Past Surgical History:  ?Procedure Laterality Date  ? CATARACT EXTRACTION, BILATERAL Bilateral   ? DILATION AND CURETTAGE OF UTERUS    ? ?Patient Active Problem List  ? Diagnosis Date Noted  ? Right shoulder pain 08/26/2020  ? Plasmacytoma ([place]) 06/28/2020  ? Angioedema 01/21/2019  ? Vertigo 09/28/2016  ? Hyperlipidemia 09/25/2011  ? Hypertension   ? ? ? ?THERAPY DIAG:  ?Acute pain of right shoulder ? ?Muscle weakness (generalized) ? ?Localized edema ? ?Right shoulder pain, unspecified chronicity ? ? ? ?PCP: [name], MD ?  ?REFERRING PROVIDER: Janice Coffin, MD ?  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  ?  ?ONSET DATE: 05/21/21 S/P right proximal humerus replacement ?  ?SUBJECTIVE:                                                                                                                                                                                     ?  ?SUBJECTIVE STATEMENT: ?She reports her shoulder is doing good, no pain at rest but sore with activity ?  ?  ?PAIN:  ?Are you having pain? Yes: NPRS scale:  3/10 ?Pain location: Rt anterior shoulder ?Pain description: sore with movement ?Aggravating factors: getting dressed, laying in bed turning over ?Relieving factors: meds, repositioning her shoulder across her chest ?  ?PRECAUTIONS: S/P right proximal humerus replacement 05/01/21, start with PROM 0-90 forward flexion up to 120 by 6 weeks. Abd to 120, ER to 40, IR to 60. Add AROM to same limits. Strengthening to add at 3-4 weeks max lifting 15#" ?  ?WEIGHT BEARING RESTRICTIONS None listed however limit weight bearing to Rt UE at  this time due to post op status ? 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  ?PLOF: Independent ?  ?PATIENT GOALS : be able to comb and fix her hair and be able to raise her arm again ?  ?OBJECTIVE:  ?  ?DIAGNOSTIC FINDINGS:  ?XR in care everywhere section ?  ?PATIENT SURVEYS:  ?FOTO 45% funcitonal, goal is 48% ? ?  ?UPPER EXTREMITY ROM:  ?  ? AROM/PROM Right ?supine ?07/15/2021 Right  ?sitting ?08/07/21  ?Shoulder flexion 0/50  10/90  ?Shoulder extension 40/    ?Shoulder abduction 30/70  50/85  ?Shoulder adduction      ?Shoulder internal rotation 50/60 at 45 deg abd    ?Shoulder external rotation 10/25 at 45 deg abd  30/  ?(Blank rows = not tested) ?  ?UPPER EXTREMITY MMT: ?  ?MMT Right ?07/15/2021    ?Shoulder flexion 2    ?Shoulder extension 3    ?Shoulder abduction 2    ?Shoulder adduction      ?Shoulder internal rotation 3    ?Shoulder external rotation 2    ?Middle trapezius      ?Lower trapezius      ?Elbow flexion      ?Elbow extension      ?Grip strength (lbs)      ?(Blank rows = not tested) ?  ?  ?PALPATION:  ?07/15/21: TTP Rt anterior shoulder ?            ?TODAY'S TREATMENT:  ?08/07/21 ?UBE no resistance 3 min fwd, 3 min reverse ?Pulleys 3 min flexion, 3 min abd ?Standing Wall ladder X 10 flexion and X 10 scaption holding 5 sec ?Sitting AAROM with 1# bar flexion (bar vertical), abduction and ER (bar horizontal) X15 ea ?Standing AAROM into Rt shoulder flexion with hands clasped 5  sec X15 ?Doorway isometrics for Rt shoulder IR,ER,abd,flexion 5 sec X10 ea ?Vasopnuematic to Rt shoulder X10 minutes lowest temperature low compression ? ?08/05/21 ?UBE no resistance 3 min fwd, 3 min reverse ?Pulleys 3 min flexion, 3 min abd ?Standing Wall ladder X 10 flexion and X 10 scaption holding 5 sec ?Standing scapular retractions 5 seconds 2X10 ?Standing AAROM into Rt shoulder flexion with hands clasped 5 sec 2X10 ?Doorway isometrics for Rt shoulder IR,ER,abd,flexion 5 sec X10 ea ?Vasopnuematic to Rt shoulder X10 minutes lowest temperature low compression ? ?07/31/21 ?UBE no resistance 2 min fwd, 2 min reverse ?Pulleys 3 min flexion, 3 min abd ?Standing pendumums Rt shoulder X15 each for CW,CCW, A-P, lateral ?Standing scapular retractions 5 seconds 2X10 ?Seated Rt Shoulder AAROM tableslide flexion, abduction,ER 5 sec holds X10 ea ?Standing AAROM into Rt shoulder flexion with hands clasped 5 sec 2X10 ?Doorway isometrics for Rt shoulder IR,ER,abd,flexion 5 sec X10 ea ?Vasopnuematic to Rt shoulder X10 minutes lowest temperature low compression ? ?  ?PATIENT EDUCATION: ?07/15/21 ?Education details: HEP,POC ?Person educated: Patient ?Education method: Explanation, Demonstration, Verbal cues, and Handouts ?Education comprehension: verbalized understanding, returned demonstration, and needs further education ?  ?  ?HOME EXERCISE PROGRAM: ?Access Code: [email or web address] ?URL: [email or web address] ?Date: 08/07/2021 ?Prepared by: Elsie Ra ? ?Exercises ?- Seated Shoulder Flexion Towel Slide at Table Top  - 2 x daily - 6 x weekly - 1-2 sets - 10 reps ?- Seated Shoulder External Rotation PROM on Table  - 2 x daily - 6 x weekly - 1-2 sets - 10 reps - 5 hold ?- Seated Shoulder Abduction Towel Slide at Table Top  - 2 x daily - 6 x weekly - 1-2  sets - 10 reps - 5 hold ?- Isometric Shoulder Flexion  - 2 x daily - 6 x weekly - 1-2 sets - 10 reps - 5 hold ?- Isometric Shoulder External Rotation  - 2 x daily - 6 x weekly -  1-2 sets - 10 reps - 5 hold ?- Isometric Shoulder Internal Rotation  - 2 x daily - 6 x weekly - 1-2 sets - 10 reps - 5 hold ?- Isometric Shoulder Abduction at Wall  - 2 x daily - 6 x weekly - 1-2 sets - 10 reps - 5 hold ?- Seated Scapular Retraction  - 2 x daily - 6 x weekly - 2 sets - 10 reps - 5 sec hold ?- Seated Shoulder External Rotation AAROM with Dowel  - 2 x daily - 6 x weekly - 1-2 sets - 10 reps ?- Seated Shoulder Abduction AAROM with Dowel  - 2 x daily - 6 x weekly - 1-2 sets - 10 reps ?- Seated Shoulder Flexion AAROM with Dowel  - 2 x daily - 6 x weekly - 1-2 sets - 10 reps ?  ?  ?  ?ASSESSMENT: ?  ?CLINICAL IMPRESSION: Active and PROM measurements showed improvements but still very limited with these along with continued weakness in Rt shoulder. We will continue to work to improve this with stretching and strengthening exercises. I progressed her HEP some today and she showed good understanding ? ?OBJECTIVE IMPAIRMENTS: decreased activity tolerance, decreased endurance, decreased mobility, decreased ROM, decreased strength, impaired flexibility, impaired LE use, postural dysfunction, and pain. ?  ?ACTIVITY LIMITATIONS:  lifting, carry,  cleaning, dressing, bathing, driving, and or occupation ?  ?REHAB POTENTIAL: Good ?  ?CLINICAL DECISION MAKING: stable/uncomplicated ?  ?EVALUATION COMPLEXITY: Low ?  ?  ?  ?GOALS: ?Short term PT Goals Target date: 08/12/2021 ?Pt will be I and compliant with HEP. ?Baseline:  ?Goal status: New ?Pt will improve Rt shoulder abduction and flexion PROM >90 deg ?Baseline: ?Goal status: New ?  ?Long term PT goals Target date: 10/07/2021 ?Pt will improve Rt shoulder ROM to [facility] to improve functional mobility ?Baseline: ?Goal status: New ?Pt will improve  Rt shoulder strength to at least 4/5 MMT to improve functional strength ?Baseline: ?Goal status: New ?Pt will improve FOTO to at least 48% functional to show improved function ?Baseline: ?Goal status: New ?Pt will reduce pain to  overall less than 2-3/10 with usual activity and work activity. ?Baseline: ?Goal status: New ?Pt will be able to dress herself and fix her hair without more than mild complaints in Rt UE. ?

## 2021-08-12 ENCOUNTER — Encounter: Payer: Self-pay | Admitting: Physical Therapy

## 2021-08-12 ENCOUNTER — Ambulatory Visit (INDEPENDENT_AMBULATORY_CARE_PROVIDER_SITE_OTHER): Payer: Medicare HMO | Admitting: Physical Therapy

## 2021-08-12 DIAGNOSIS — M6281 Muscle weakness (generalized): Secondary | ICD-10-CM

## 2021-08-12 DIAGNOSIS — R600 Localized edema: Secondary | ICD-10-CM | POA: Diagnosis not present

## 2021-08-12 DIAGNOSIS — M25511 Pain in right shoulder: Secondary | ICD-10-CM | POA: Diagnosis not present

## 2021-08-12 NOTE — Therapy (Signed)
?OUTPATIENT PHYSICAL THERAPY TREATMENT NOTE ? ? ?Patient Name: Joanna Ray ?MRN: [id number] ?DOB:[birth year], 71 y.o., female ?Today's Date: 08/12/2021 ? ?PCP: [name], MD ?REFERRING PROVIDER: Janice Coffin, MD ? ?END OF SESSION:  ? PT End of Session - 08/12/21 1018   ? ? Visit Number 6   ? Number of Visits 24   ? Date for PT Re-Evaluation 10/07/21   ? Authorization Type [employer] MCR   ? PT Start Time 1015   ? PT Stop Time 1100   ? PT Time Calculation (min) 45 min   ? Activity Tolerance Patient tolerated treatment well   ? ?  ?  ? ?  ? ? ? ?Past Medical History:  ?Diagnosis Date  ? Anxiety   ? GERD (gastroesophageal reflux disease)   ? Hyperlipidemia   ? Hypertension   ? Urticaria   ? ?Past Surgical History:  ?Procedure Laterality Date  ? CATARACT EXTRACTION, BILATERAL Bilateral   ? DILATION AND CURETTAGE OF UTERUS    ? ?Patient Active Problem List  ? Diagnosis Date Noted  ? Right shoulder pain 08/26/2020  ? Plasmacytoma ([place]) 06/28/2020  ? Angioedema 01/21/2019  ? Vertigo 09/28/2016  ? Hyperlipidemia 09/25/2011  ? Hypertension   ? ? ? ?THERAPY DIAG:  ?Acute pain of right shoulder ? ?Muscle weakness (generalized) ? ?Localized edema ? ?Right shoulder pain, unspecified chronicity ? ? ? ?PCP: [name], MD ?  ?REFERRING PROVIDER: Janice Coffin, MD ?  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  ?  ?ONSET DATE: 05/21/21 S/P right proximal humerus replacement ?  ?SUBJECTIVE:                                                                                                                                                                                     ?  ?SUBJECTIVE STATEMENT: ?She reports her shoulder was sore starting this weekend, not sure why but still sore today ?  ?  ?PAIN:  ?Are you having pain? Yes:  NPRS scale: 5/10 ?Pain location: Rt anterior shoulder ?Pain description: sore with movement ?Aggravating factors: getting dressed, laying in bed turning over ?Relieving factors: meds, repositioning her shoulder across her chest ?  ?PRECAUTIONS: S/P right proximal humerus replacement 05/01/21, start with PROM 0-90 forward flexion up to 120 by 6 weeks. Abd to 120, ER to 40, IR to 60. Add AROM to same limits. Strengthening to add at 3-4 weeks max lifting 15#" ?  ?WEIGHT BEARING RESTRICTIONS None listed however limit weight bearing to Rt UE  at this time due to post op status ? 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  ?PLOF: Independent ?  ?PATIENT GOALS : be able to comb and fix her hair and be able to raise her arm again ?  ?OBJECTIVE:  ?  ?DIAGNOSTIC FINDINGS:  ?XR in care everywhere section ?  ?PATIENT SURVEYS:  ?FOTO 45% funcitonal, goal is 48% ? ?  ?UPPER EXTREMITY ROM:  ?  ? AROM/PROM Right ?supine ?07/15/2021 Right  ?sitting ?08/07/21  ?Shoulder flexion 0/50  10/90  ?Shoulder extension 40/    ?Shoulder abduction 30/70  50/85  ?Shoulder adduction      ?Shoulder internal rotation 50/60 at 45 deg abd    ?Shoulder external rotation 10/25 at 45 deg abd  30/  ?(Blank rows = not tested) ?  ?UPPER EXTREMITY MMT: ?  ?MMT Right ?07/15/2021  Right ?08/07/21  ?Shoulder flexion 2  2  ?Shoulder extension 3  3+  ?Shoulder abduction 2  2  ?Shoulder adduction      ?Shoulder internal rotation 3  3+  ?Shoulder external rotation 2  2+  ?Middle trapezius      ?Lower trapezius      ?Elbow flexion      ?Elbow extension      ?Grip strength (lbs)      ?(Blank rows = not tested) ?  ?  ?PALPATION:  ?07/15/21: TTP Rt anterior shoulder ?            ?TODAY'S TREATMENT:  ?08/12/21 ?UBE L3 74mn fwd, 2 min reverse ?Pulleys 3 min flexion, 3 min abd ?Standing green ball rolls for AAROM flexion, abd, ER Rt shoulder 5 sec X 10 ea ?Standing Wall ladder X 10 flexion and X 10 scaption holding 5 sec ?Sitting AAROM with 1# bar flexion (bar vertical),  abduction and ER (bar horizontal) X10 ea holding 5 sec ?Standing AAROM into Rt shoulder flexion with hands clasped 5 sec X10 ?Vasopnuematic to Rt shoulder X10 minutes lowest temperature low compression ? ?08/07/21 ?UBE no resistance 3 min fwd, 3 min reverse ?Pulleys 3 min flexion, 3 min abd ?Standing Wall ladder X 10 flexion and X 10 scaption holding 5 sec ?Sitting AAROM with 1# bar flexion (bar vertical), abduction and ER (bar horizontal) X15 ea ?Standing AAROM into Rt shoulder flexion with hands clasped 5 sec X15 ?Doorway isometrics for Rt shoulder IR,ER,abd,flexion 5 sec X10 ea ?Vasopnuematic to Rt shoulder X10 minutes lowest temperature low compression ? ?08/05/21 ?UBE no resistance 3 min fwd, 3 min reverse ?Pulleys 3 min flexion, 3 min abd ?Standing Wall ladder X 10 flexion and X 10 scaption holding 5 sec ?Standing scapular retractions 5 seconds 2X10 ?Standing AAROM into Rt shoulder flexion with hands clasped 5 sec 2X10 ?Doorway isometrics for Rt shoulder IR,ER,abd,flexion 5 sec X10 ea ?Vasopnuematic to Rt shoulder X10 minutes lowest temperature low compression ? ?  ?PATIENT EDUCATION: ?07/15/21 ?Education details: HEP,POC ?Person educated: Patient ?Education method: Explanation, Demonstration, Verbal cues, and Handouts ?Education comprehension: verbalized understanding, returned demonstration, and needs further education ?  ?  ?HOME EXERCISE PROGRAM: ?Access Code: [email or web address]?URL: [email or web address] ?Date: 08/07/2021 ?Prepared by: BElsie Ra? ?Exercises ?- Seated Shoulder Flexion Towel Slide at Table Top  - 2 x daily - 6 x weekly - 1-2 sets - 10 reps ?- Seated Shoulder External Rotation PROM on Table  - 2 x daily - 6 x weekly - 1-2 sets - 10 reps - 5 hold ?- Seated Shoulder Abduction Towel Slide at Table Top  -  2 x daily - 6 x weekly - 1-2 sets - 10 reps - 5 hold ?- Isometric Shoulder Flexion  - 2 x daily - 6 x weekly - 1-2 sets - 10 reps - 5 hold ?- Isometric Shoulder External Rotation  - 2 x  daily - 6 x weekly - 1-2 sets - 10 reps - 5 hold ?- Isometric Shoulder Internal Rotation  - 2 x daily - 6 x weekly - 1-2 sets - 10 reps - 5 hold ?- Isometric Shoulder Abduction at Wall  - 2 x daily - 6 x weekly - 1-2 sets - 10 reps - 5 hold ?- Seated Scapular Retraction  - 2 x daily - 6 x weekly - 2 sets - 10 reps - 5 sec hold ?- Seated Shoulder External Rotation AAROM with Dowel  - 2 x daily - 6 x weekly - 1-2 sets - 10 reps ?- Seated Shoulder Abduction AAROM with Dowel  - 2 x daily - 6 x weekly - 1-2 sets - 10 reps ?- Seated Shoulder Flexion AAROM with Dowel  - 2 x daily - 6 x weekly - 1-2 sets - 10 reps ?  ?  ?  ?ASSESSMENT: ?  ?CLINICAL IMPRESSION: Session focused on continued efforts to improve her Rt shoulder AAROM and strength to her tolerance. She will continue to benefit from PT to address these functional limitations. She does get good pain relief from vaso.  ? ?OBJECTIVE IMPAIRMENTS: decreased activity tolerance, decreased endurance, decreased mobility, decreased ROM, decreased strength, impaired flexibility, impaired LE use, postural dysfunction, and pain. ?  ?ACTIVITY LIMITATIONS:  lifting, carry,  cleaning, dressing, bathing, driving, and or occupation ?  ?REHAB POTENTIAL: Good ?  ?CLINICAL DECISION MAKING: stable/uncomplicated ?  ?EVALUATION COMPLEXITY: Low ?  ?  ?  ?GOALS: ?Short term PT Goals Target date: 08/12/2021 ?Pt will be I and compliant with HEP. ?Baseline:  ?Goal status: MET ?Pt will improve Rt shoulder abduction and flexion PROM >90 deg ?Baseline: ?Goal status: mostly met 90 flexion 85 abuction ?  ?Long term PT goals Target date: 10/07/2021 ?Pt will improve Rt shoulder ROM to [facility] to improve functional mobility ?Baseline: ?Goal status: ongoing ?Pt will improve  Rt shoulder strength to at least 4/5 MMT to improve functional strength ?Baseline: ?Goal status: ongoing ?Pt will improve FOTO to at least 48% functional to show improved function ?Baseline: ?Goal status: ongoing ?Pt will reduce pain to  overall less than 2-3/10 with usual activity and work activity. ?Baseline: ?Goal status: ongoing ?Pt will be able to dress herself and fix her hair without more than mild complaints in Rt UE. ?

## 2021-08-14 ENCOUNTER — Ambulatory Visit: Payer: Medicare HMO | Admitting: Physical Therapy

## 2021-08-14 ENCOUNTER — Encounter: Payer: Self-pay | Admitting: Physical Therapy

## 2021-08-14 DIAGNOSIS — M6281 Muscle weakness (generalized): Secondary | ICD-10-CM

## 2021-08-14 DIAGNOSIS — M25511 Pain in right shoulder: Secondary | ICD-10-CM | POA: Diagnosis not present

## 2021-08-14 DIAGNOSIS — R600 Localized edema: Secondary | ICD-10-CM

## 2021-08-14 NOTE — Therapy (Signed)
?OUTPATIENT PHYSICAL THERAPY TREATMENT NOTE ? ? ?Patient Name: Joanna Ray ?MRN: [id number] ?DOB:17-Jun-1950, 71 y.o., female ?Today's Date: 08/14/2021 ? ?PCP: [name], MD ?REFERRING PROVIDER: Janice Coffin, MD ? ?END OF SESSION:  ? PT End of Session - 08/14/21 1023   ? ? Visit Number 7   ? Number of Visits 24   ? Date for PT Re-Evaluation 10/07/21   ? Authorization Type [employer] MCR   ? PT Start Time 1015   ? PT Stop Time 1100   ? PT Time Calculation (min) 45 min   ? Activity Tolerance Patient tolerated treatment well   ? ?  ?  ? ?  ? ? ? ?Past Medical History:  ?Diagnosis Date  ? Anxiety   ? GERD (gastroesophageal reflux disease)   ? Hyperlipidemia   ? Hypertension   ? Urticaria   ? ?Past Surgical History:  ?Procedure Laterality Date  ? CATARACT EXTRACTION, BILATERAL Bilateral   ? DILATION AND CURETTAGE OF UTERUS    ? ?Patient Active Problem List  ? Diagnosis Date Noted  ? Right shoulder pain 08/26/2020  ? Plasmacytoma ([place]) 06/28/2020  ? Angioedema 01/21/2019  ? Vertigo 09/28/2016  ? Hyperlipidemia 09/25/2011  ? Hypertension   ? ? ? ?THERAPY DIAG:  ?Acute pain of right shoulder ? ?Muscle weakness (generalized) ? ?Localized edema ? ?Right shoulder pain, unspecified chronicity ? ? ? ?PCP: [name], MD ?  ?REFERRING PROVIDER: Janice Coffin, MD ?  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  ?  ?ONSET DATE: 05/21/21 S/P right proximal humerus replacement ?  ?SUBJECTIVE:                                                                                                                                                                                     ?  ?SUBJECTIVE STATEMENT: ?She reports her shoulder feels pretty good with pain at rest but still unable to use her arm very much and has pain with activity ?   ?  ?PAIN:  ?Are you having pain? Yes: NPRS scale: 5/10 ?Pain location: Rt anterior shoulder ?Pain description: sore with movement ?Aggravating factors: getting dressed, laying in bed turning over ?Relieving factors: meds, repositioning her shoulder across her chest ?  ?PRECAUTIONS: S/P right proximal humerus replacement 05/01/21, start with PROM 0-90 forward flexion up to 120 by 6 weeks. Abd to 120, ER to 40, IR to 60. Add AROM to same limits. Strengthening to add at 3-4 weeks max lifting 15#" ?  ?WEIGHT BEARING RESTRICTIONS  None listed however limit weight bearing to Rt UE at this time due to post op status ? 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  ?PLOF: Independent ?  ?PATIENT GOALS : be able to comb and fix her hair and be able to raise her arm again ?  ?OBJECTIVE:  ?  ?DIAGNOSTIC FINDINGS:  ?XR in care everywhere section ?  ?PATIENT SURVEYS:  ?FOTO 45% funcitonal, goal is 48% ? ?  ?UPPER EXTREMITY ROM:  ?  ? AROM/PROM Right ?supine ?07/15/2021 Right  ?sitting ?08/07/21  ?Shoulder flexion 0/50  10/90  ?Shoulder extension 40/    ?Shoulder abduction 30/70  50/85  ?Shoulder adduction      ?Shoulder internal rotation 50/60 at 45 deg abd    ?Shoulder external rotation 10/25 at 45 deg abd  30/  ?(Blank rows = not tested) ?  ?UPPER EXTREMITY MMT: ?  ?MMT Right ?07/15/2021  Right ?08/12/21  ?Shoulder flexion 2  2  ?Shoulder extension 3  3+  ?Shoulder abduction 2  2  ?Shoulder adduction      ?Shoulder internal rotation 3  3+  ?Shoulder external rotation 2  2+  ?Middle trapezius      ?Lower trapezius      ?Elbow flexion      ?Elbow extension      ?Grip strength (lbs)      ?(Blank rows = not tested) ?  ?  ?PALPATION:  ?07/15/21: TTP Rt anterior shoulder ? ?TODAY'S TREATMENT ?            ?08/14/21 ?UBE L3 23mn fwd, 2 min reverse ?Pulleys 3 min flexion, 3 min abd ?Standing UE ranger 8 inch from bottom for flexion X10 and circles CW,CCW X 10 ?Standing green ball rolls for AAROM flexion, abd, ER Rt shoulder 5 sec X 10  ea ?Standing Wall ladder X 10 flexion and X 10 scaption holding 5 sec ?Sitting AAROM with 1# bar flexion (bar vertical), abduction and ER (bar horizontal) X10 ea holding 5 sec ?Vasopnuematic to Rt shoulder X10 minutes lowest temperature low compression ? ? ?08/12/21 ?UBE L3 256m fwd, 2 min reverse ?Pulleys 3 min flexion, 3 min abd ?Standing green ball rolls for AAROM flexion, abd, ER Rt shoulder 5 sec X 10 ea ?Standing Wall ladder X 10 flexion and X 10 scaption holding 5 sec ?Sitting AAROM with 1# bar flexion (bar vertical), abduction and ER (bar horizontal) X10 ea holding 5 sec ?Standing AAROM into Rt shoulder flexion with hands clasped 5 sec X10 ?Vasopnuematic to Rt shoulder X10 minutes lowest temperature low compression ? ?08/07/21 ?UBE no resistance 3 min fwd, 3 min reverse ?Pulleys 3 min flexion, 3 min abd ?Standing Wall ladder X 10 flexion and X 10 scaption holding 5 sec ?Sitting AAROM with 1# bar flexion (bar vertical), abduction and ER (bar horizontal) X15 ea ?Standing AAROM into Rt shoulder flexion with hands clasped 5 sec X15 ?Doorway isometrics for Rt shoulder IR,ER,abd,flexion 5 sec X10 ea ?Vasopnuematic to Rt shoulder X10 minutes lowest temperature low compression ? ? ?  ?PATIENT EDUCATION: ?07/15/21 ?Education details: HEP,POC ?Person educated: Patient ?Education method: Explanation, Demonstration, Verbal cues, and Handouts ?Education comprehension: verbalized understanding, returned demonstration, and needs further education ?  ?  ?HOME EXERCISE PROGRAM: ?Access Code: [email or web address]: [email or web address] ?Date: 08/07/2021 ?Prepared by: BrElsie Ra ?Exercises ?- Seated Shoulder Flexion Towel Slide at Table Top  - 2 x daily - 6 x weekly - 1-2 sets - 10 reps ?- Seated Shoulder External Rotation PROM on  Table  - 2 x daily - 6 x weekly - 1-2 sets - 10 reps - 5 hold ?- Seated Shoulder Abduction Towel Slide at Table Top  - 2 x daily - 6 x weekly - 1-2 sets - 10 reps - 5 hold ?- Isometric  Shoulder Flexion  - 2 x daily - 6 x weekly - 1-2 sets - 10 reps - 5 hold ?- Isometric Shoulder External Rotation  - 2 x daily - 6 x weekly - 1-2 sets - 10 reps - 5 hold ?- Isometric Shoulder Internal Rotation  - 2 x daily - 6 x weekly - 1-2 sets - 10 reps - 5 hold ?- Isometric Shoulder Abduction at Wall  - 2 x daily - 6 x weekly - 1-2 sets - 10 reps - 5 hold ?- Seated Scapular Retraction  - 2 x daily - 6 x weekly - 2 sets - 10 reps - 5 sec hold ?- Seated Shoulder External Rotation AAROM with Dowel  - 2 x daily - 6 x weekly - 1-2 sets - 10 reps ?- Seated Shoulder Abduction AAROM with Dowel  - 2 x daily - 6 x weekly - 1-2 sets - 10 reps ?- Seated Shoulder Flexion AAROM with Dowel  - 2 x daily - 6 x weekly - 1-2 sets - 10 reps ?  ?  ?  ?ASSESSMENT: ?  ?CLINICAL IMPRESSION: She was able to progress to UE ranger but still difficulty raising her arm with this due to weakness. PT will continue to work to improve strength and ROM as tolerated. ? ?OBJECTIVE IMPAIRMENTS: decreased activity tolerance, decreased endurance, decreased mobility, decreased ROM, decreased strength, impaired flexibility, impaired LE use, postural dysfunction, and pain. ?  ?ACTIVITY LIMITATIONS:  lifting, carry,  cleaning, dressing, bathing, driving, and or occupation ?  ?REHAB POTENTIAL: Good ?  ?CLINICAL DECISION MAKING: stable/uncomplicated ?  ?EVALUATION COMPLEXITY: Low ?  ?  ?  ?GOALS: ?Short term PT Goals Target date: 08/12/2021 ?Pt will be I and compliant with HEP. ?Baseline:  ?Goal status: MET ?Pt will improve Rt shoulder abduction and flexion PROM >90 deg ?Baseline: ?Goal status: mostly met 90 flexion 85 abuction ?  ?Long term PT goals Target date: 10/07/2021 ?Pt will improve Rt shoulder ROM to [facility] to improve functional mobility ?Baseline: ?Goal status: ongoing ?Pt will improve  Rt shoulder strength to at least 4/5 MMT to improve functional strength ?Baseline: ?Goal status: ongoing ?Pt will improve FOTO to at least 48% functional to show  improved function ?Baseline: ?Goal status: ongoing ?Pt will reduce pain to overall less than 2-3/10 with usual activity and work activity. ?Baseline: ?Goal status: ongoing ?Pt will be able to dress herself and fix he

## 2021-08-19 ENCOUNTER — Encounter: Payer: Medicare HMO | Admitting: Physical Therapy

## 2021-08-20 ENCOUNTER — Encounter: Payer: Self-pay | Admitting: Internal Medicine

## 2021-08-21 ENCOUNTER — Ambulatory Visit (INDEPENDENT_AMBULATORY_CARE_PROVIDER_SITE_OTHER): Payer: Medicare HMO | Admitting: Physical Therapy

## 2021-08-21 ENCOUNTER — Encounter: Payer: Self-pay | Admitting: Physical Therapy

## 2021-08-21 DIAGNOSIS — R600 Localized edema: Secondary | ICD-10-CM | POA: Diagnosis not present

## 2021-08-21 DIAGNOSIS — M25511 Pain in right shoulder: Secondary | ICD-10-CM

## 2021-08-21 DIAGNOSIS — M6281 Muscle weakness (generalized): Secondary | ICD-10-CM | POA: Diagnosis not present

## 2021-08-21 NOTE — Therapy (Signed)
?OUTPATIENT PHYSICAL THERAPY TREATMENT NOTE ? ? ?Patient Name: Joanna Ray ?MRN: [id number] ?DOB:[birth year], 71 y.o., female ?Today's Date: 08/21/2021 ? ?PCP: [name], MD ?REFERRING PROVIDER: Janice Coffin, MD ? ?END OF SESSION:  ? PT End of Session - 08/21/21 1037   ? ? Visit Number 8   ? Number of Visits 24   ? Date for PT Re-Evaluation 10/07/21   ? Authorization Type [employer] MCR   ? PT Start Time 1020   ? PT Stop Time 1105   ? PT Time Calculation (min) 45 min   ? Activity Tolerance Patient tolerated treatment well   ? Behavior During Therapy [facility] for tasks assessed/performed   ? ?  ?  ? ?  ? ? ? ?Past Medical History:  ?Diagnosis Date  ? Anxiety   ? GERD (gastroesophageal reflux disease)   ? Hyperlipidemia   ? Hypertension   ? Urticaria   ? ?Past Surgical History:  ?Procedure Laterality Date  ? CATARACT EXTRACTION, BILATERAL Bilateral   ? DILATION AND CURETTAGE OF UTERUS    ? ?Patient Active Problem List  ? Diagnosis Date Noted  ? Right shoulder pain 08/26/2020  ? Plasmacytoma ([place]) 06/28/2020  ? Angioedema 01/21/2019  ? Vertigo 09/28/2016  ? Hyperlipidemia 09/25/2011  ? Hypertension   ? ? ? ?THERAPY DIAG:  ?Acute pain of right shoulder ? ?Muscle weakness (generalized) ? ?Localized edema ? ?Right shoulder pain, unspecified chronicity ? ? ? ?PCP: [name], MD ?  ?REFERRING PROVIDER: Janice Coffin, MD ?  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  ?  ?ONSET DATE: 05/21/21 S/P right proximal humerus replacement ?  ?SUBJECTIVE:                                                                                                                                                                                     ?  ?SUBJECTIVE STATEMENT: ?She reports her shoulder is slowly improving, less pain and can lift it  a little better ?  ?  ?PAIN:  ?Are you having pain? Yes: NPRS scale: 3/10 ?Pain location: Rt anterior shoulder ?Pain description: sore with movement ?Aggravating factors: getting dressed, laying in bed turning over ?Relieving factors: meds, repositioning her shoulder across her chest ?  ?PRECAUTIONS: S/P right proximal humerus replacement 05/01/21, start with PROM 0-90 forward flexion up to 120 by 6 weeks. Abd to 120, ER to 40, IR to 60. Add AROM to same limits. Strengthening to add at 3-4 weeks max lifting 15#" ?  ?WEIGHT BEARING  RESTRICTIONS None listed however limit weight bearing to Rt UE at this time due to post op status ? 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  ?PLOF: Independent ?  ?PATIENT GOALS : be able to comb and fix her hair and be able to raise her arm again ?  ?OBJECTIVE:  ?  ?DIAGNOSTIC FINDINGS:  ?XR in care everywhere section ?  ?PATIENT SURVEYS:  ?FOTO 45% funcitonal, goal is 48% ? ?  ?UPPER EXTREMITY ROM:  ?  ? AROM/PROM Right ?supine ?07/15/2021 Right  ?sitting ?08/07/21 Right ?Standing ?08/21/21  ?Shoulder flexion 0/50  10/90 60  ?Shoulder extension 40/     ?Shoulder abduction 30/70  50/85 80  ?Shoulder adduction       ?Shoulder internal rotation 50/60 at 45 deg abd     ?Shoulder external rotation 10/25 at 45 deg abd  30/ 55/  ?(Blank rows = not tested) ?  ?UPPER EXTREMITY MMT: ?  ?MMT Right ?07/15/2021  Right ?08/12/21  ?Shoulder flexion 2  2  ?Shoulder extension 3  3+  ?Shoulder abduction 2  2  ?Shoulder adduction      ?Shoulder internal rotation 3  3+  ?Shoulder external rotation 2  2+  ?Middle trapezius      ?Lower trapezius      ?Elbow flexion      ?Elbow extension      ?Grip strength (lbs)      ?(Blank rows = not tested) ?  ?  ?PALPATION:  ?07/15/21: TTP Rt anterior shoulder ? ?TODAY'S TREATMENT ? ? 08/21/21 ?UBE L3 2.5 min fwd, 2.5  min reverse ?Pulleys 2 min flexion, 2 min abd ?Standing UE ranger 8 inch from bottom for flexion X10 and circles CW,CCW X 10 ?Standing Wall ladder X 10  flexion and X 10 scaption holding 5 sec ?Standing red band rows, extensions, ER X 15 each on Rt ?Vasopnuematic to Rt shoulder X10 minutes lowest temperature low compression ? ? ?08/14/21 ?UBE L3 34mn fwd, 2 min reverse ?Pulleys 3 min flexion, 3 min abd ?Standing UE ranger 8 inch from bottom for flexion X10 and circles CW,CCW X 10 ?Standing green ball rolls for AAROM flexion, abd, ER Rt shoulder 5 sec X 10 ea ?Standing Wall ladder X 10 flexion and X 10 scaption holding 5 sec ?Sitting AAROM with 1# bar flexion (bar vertical), abduction and ER (bar horizontal) X10 ea holding 5 sec ?Vasopnuematic to Rt shoulder X10 minutes lowest temperature low compression ? ? ?08/12/21 ?UBE L3 210m fwd, 2 min reverse ?Pulleys 3 min flexion, 3 min abd ?Standing green ball rolls for AAROM flexion, abd, ER Rt shoulder 5 sec X 10 ea ?Standing Wall ladder X 10 flexion and X 10 scaption holding 5 sec ?Sitting AAROM with 1# bar flexion (bar vertical), abduction and ER (bar horizontal) X10 ea holding 5 sec ?Standing AAROM into Rt shoulder flexion with hands clasped 5 sec X10 ?Vasopnuematic to Rt shoulder X10 minutes lowest temperature low compression ? ?08/07/21 ?UBE no resistance 3 min fwd, 3 min reverse ?Pulleys 3 min flexion, 3 min abd ?Standing Wall ladder X 10 flexion and X 10 scaption holding 5 sec ?Sitting AAROM with 1# bar flexion (bar vertical), abduction and ER (bar horizontal) X15 ea ?Standing AAROM into Rt shoulder flexion with hands clasped 5 sec X15 ?Doorway isometrics for Rt shoulder IR,ER,abd,flexion 5 sec X10 ea ?Vasopnuematic to Rt shoulder X10 minutes lowest temperature low compression ? ? ?  ?PATIENT EDUCATION: ?07/15/21 ?Education details: HEP,POC ?Person educated: Patient ?Education  method: Explanation, Demonstration, Verbal cues, and Handouts ?Education comprehension: verbalized understanding, returned demonstration, and needs further education ?  ?  ?HOME EXERCISE PROGRAM: ?Access Code: [email or web address] ?URL:  [email or web address] ?Date: 08/07/2021 ?Prepared by: Elsie Ra ? ?Exercises ?- Seated Shoulder Flexion Towel Slide at Table Top  - 2 x daily - 6 x weekly - 1-2 sets - 10 reps ?- Seated Shoulder External Rotation PROM on Table  - 2 x daily - 6 x weekly - 1-2 sets - 10 reps - 5 hold ?- Seated Shoulder Abduction Towel Slide at Table Top  - 2 x daily - 6 x weekly - 1-2 sets - 10 reps - 5 hold ?- Isometric Shoulder Flexion  - 2 x daily - 6 x weekly - 1-2 sets - 10 reps - 5 hold ?- Isometric Shoulder External Rotation  - 2 x daily - 6 x weekly - 1-2 sets - 10 reps - 5 hold ?- Isometric Shoulder Internal Rotation  - 2 x daily - 6 x weekly - 1-2 sets - 10 reps - 5 hold ?- Isometric Shoulder Abduction at Wall  - 2 x daily - 6 x weekly - 1-2 sets - 10 reps - 5 hold ?- Seated Scapular Retraction  - 2 x daily - 6 x weekly - 2 sets - 10 reps - 5 sec hold ?- Seated Shoulder External Rotation AAROM with Dowel  - 2 x daily - 6 x weekly - 1-2 sets - 10 reps ?- Seated Shoulder Abduction AAROM with Dowel  - 2 x daily - 6 x weekly - 1-2 sets - 10 reps ?- Seated Shoulder Flexion AAROM with Dowel  - 2 x daily - 6 x weekly - 1-2 sets - 10 reps ?  ?  ?  ?ASSESSMENT: ?  ?CLINICAL IMPRESSION: She showed improvements in Rt shoulder strength by able to lift her arm against gravity with improved AROM, see measurements above. She is still limited with this and PT recommending to continue to work to improve strength and ROM as tolerated. ? ?OBJECTIVE IMPAIRMENTS: decreased activity tolerance, decreased endurance, decreased mobility, decreased ROM, decreased strength, impaired flexibility, impaired LE use, postural dysfunction, and pain. ?  ?ACTIVITY LIMITATIONS:  lifting, carry,  cleaning, dressing, bathing, driving, and or occupation ?  ?REHAB POTENTIAL: Good ?  ?CLINICAL DECISION MAKING: stable/uncomplicated ?  ?EVALUATION COMPLEXITY: Low ?  ?  ?  ?GOALS: ?Short term PT Goals Target date: 08/12/2021 ?Pt will be I and compliant  with HEP. ?Baseline:  ?Goal status: MET ?Pt will improve Rt shoulder abduction and flexion PROM >90 deg ?Baseline: ?Goal status: mostly met 90 flexion 85 abuction ?  ?Long term PT goals Target date: 10/07/2021 ?Pt will impr

## 2021-08-26 ENCOUNTER — Encounter: Payer: Self-pay | Admitting: Physical Therapy

## 2021-08-26 ENCOUNTER — Ambulatory Visit (INDEPENDENT_AMBULATORY_CARE_PROVIDER_SITE_OTHER): Payer: Medicare HMO | Admitting: Physical Therapy

## 2021-08-26 ENCOUNTER — Other Ambulatory Visit: Payer: Self-pay

## 2021-08-26 DIAGNOSIS — M25511 Pain in right shoulder: Secondary | ICD-10-CM | POA: Diagnosis not present

## 2021-08-26 DIAGNOSIS — R600 Localized edema: Secondary | ICD-10-CM | POA: Diagnosis not present

## 2021-08-26 DIAGNOSIS — M6281 Muscle weakness (generalized): Secondary | ICD-10-CM | POA: Diagnosis not present

## 2021-08-26 NOTE — Therapy (Signed)
?OUTPATIENT PHYSICAL THERAPY TREATMENT NOTE ? ? ?Patient Name: Joanna Ray ?MRN: [id number] ?DOB:Feb 21, 1951, 71 y.o., female ?Today's Date: 08/26/2021 ? ?PCP: [name], MD ?REFERRING PROVIDER: Janice Coffin, MD ? ?END OF SESSION:  ? PT End of Session - 08/26/21 1017   ? ? Visit Number 9   ? Number of Visits 24   ? Date for PT Re-Evaluation 10/07/21   ? Authorization Type [employer] MCR   ? PT Start Time 1017   ? PT Stop Time 1100   ? PT Time Calculation (min) 43 min   ? ?  ?  ? ?  ? ? ? ? ?Past Medical History:  ?Diagnosis Date  ? Anxiety   ? GERD (gastroesophageal reflux disease)   ? Hyperlipidemia   ? Hypertension   ? Urticaria   ? ?Past Surgical History:  ?Procedure Laterality Date  ? CATARACT EXTRACTION, BILATERAL Bilateral   ? DILATION AND CURETTAGE OF UTERUS    ? ?Patient Active Problem List  ? Diagnosis Date Noted  ? Right shoulder pain 08/26/2020  ? Plasmacytoma ([place]) 06/28/2020  ? Angioedema 01/21/2019  ? Vertigo 09/28/2016  ? Hyperlipidemia 09/25/2011  ? Hypertension   ? ? ? ?THERAPY DIAG:  ?Acute pain of right shoulder ? ?Muscle weakness (generalized) ? ?Localized edema ? ?Right shoulder pain, unspecified chronicity ? ? ? ?PCP: [name], MD ?  ?REFERRING PROVIDER: Janice Coffin, MD ?  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  ?  ?ONSET DATE: 05/21/21 S/P right proximal humerus replacement ?  ?SUBJECTIVE:                                                                                                                                                                                    ?  ?SUBJECTIVE STATEMENT: ?Pt arriving today reporting 4/10 pain in her Rt shoulder. Pt stating she is still having difficulty with sleeping. Pt reporting she is still having to take her time with her ADL's and is  still unable to reach behind her head for bathing.  ?  ?  ?PAIN:  ?Are you having pain? Yes: NPRS scale: 4/10 ?Pain location: Rt anterior shoulder ?Pain description: sore with movement ?Aggravating factors: getting dressed, laying in bed turning over ?Relieving factors: meds, repositioning her shoulder across her chest ?  ?PRECAUTIONS: S/P right proximal humerus replacement 05/01/21, start with PROM 0-90 forward flexion up to 120 by 6 weeks. Abd to 120, ER to 40, IR to 60. Add AROM to same limits. Strengthening to add at  3-4 weeks max lifting 15#" ?  ?WEIGHT BEARING RESTRICTIONS None listed however limit weight bearing to Rt UE at this time due to post op status ? 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  ?PLOF: Independent ?  ?PATIENT GOALS : be able to comb and fix her hair and be able to raise her arm again ?  ?OBJECTIVE:  ?  ?DIAGNOSTIC FINDINGS:  ?XR in care everywhere section ?  ?PATIENT SURVEYS:  ?FOTO 45% funcitonal, goal is 48% ? ?  ?UPPER EXTREMITY ROM:  ?  ? AROM/PROM Right ?supine ?07/15/2021 Right  ?sitting ?08/07/21 Right ?Standing ?08/21/21 Right  ?Supine ?Passive ?08/26/21  ?Shoulder flexion 0/50  10/90 60 140  ?Shoulder extension 40/      ?Shoulder abduction 30/70  50/85 80 110  ?Shoulder adduction        ?Shoulder internal rotation 50/60 at 45 deg abd    75 ?Shoulder abd 45 degrees  ?Shoulder external rotation 10/25 at 45 deg abd  30/ 55/ 70  ?Shoulder abd to 45 degrees   ?(Blank rows = not tested) ?  ?UPPER EXTREMITY MMT: ?  ?MMT Right ?07/15/2021  Right ?08/12/21  ?Shoulder flexion 2  2  ?Shoulder extension 3  3+  ?Shoulder abduction 2  2  ?Shoulder adduction      ?Shoulder internal rotation 3  3+  ?Shoulder external rotation 2  2+  ?Middle trapezius      ?Lower trapezius      ?Elbow flexion      ?Elbow extension      ?Grip strength (lbs)      ?(Blank rows = not tested) ?  ?  ?PALPATION:  ?07/15/21: TTP Rt anterior shoulder ? ?TODAY'S TREATMENT ? ?08/26/21: ?TherEX: ?UBE L3 x 3 minutes each  direction ?Pulleys 2 min flexion, 2 min abd ?Standing UE ranger 8 inch from bottom for flexion X10 and circles CW,CCW X 10 ?Standing Wall ladder X 10 flexion and X 10 scaption holding 5 sec ?Standing Level 2 band ER X 15 each on Rt ?Standing Rows: Level 3 band x 15 on Rt ?Shoulder Isometrics x 10 holding 5 seconds each c flexion, extension, IR and ER ?Modalities: ?Vasopnuematic to Rt shoulder X10 minutes lowest temperature low compression ? ? ? ? 08/21/21 ?UBE L3 2.5 min fwd, 2.5  min reverse ?Pulleys 2 min flexion, 2 min abd ?Standing UE ranger 8 inch from bottom for flexion X10 and circles CW,CCW X 10 ?Standing Wall ladder X 10 flexion and X 10 scaption holding 5 sec ?Standing red band rows, extensions, ER X 15 each on Rt ?Vasopnuematic to Rt shoulder X10 minutes lowest temperature low compression ? ? ?08/14/21 ?UBE L3 90mn fwd, 2 min reverse ?Pulleys 3 min flexion, 3 min abd ?Standing UE ranger 8 inch from bottom for flexion X10 and circles CW,CCW X 10 ?Standing green ball rolls for AAROM flexion, abd, ER Rt shoulder 5 sec X 10 ea ?Standing Wall ladder X 10 flexion and X 10 scaption holding 5 sec ?Sitting AAROM with 1# bar flexion (bar vertical), abduction and ER (bar horizontal) X10 ea holding 5 sec ?Vasopnuematic to Rt shoulder X10 minutes lowest temperature low compression ? ? ?  ?PATIENT EDUCATION: ?07/15/21 ?Education details: added isometrics to pt's HEP against the wall  ?Person educated: Patient ?Education method: Explanation, Demonstration, Verbal cues, and Handouts ?Education comprehension: verbalized understanding, returned demonstration, and needs further education ?  ?  ?HOME EXERCISE PROGRAM: ?Access Code: [email or web address]?URL: [email or web address] ?Date: 08/07/2021 ?Prepared by: BAaron Edelman  [employer] ? ?Exercises ?- Seated Shoulder Flexion Towel Slide at Table Top  - 2 x daily - 6 x weekly - 1-2 sets - 10 reps ?- Seated Shoulder External Rotation PROM on Table  - 2 x daily - 6 x weekly - 1-2 sets - 10  reps - 5 hold ?- Seated Shoulder Abduction Towel Slide at Table Top  - 2 x daily - 6 x weekly - 1-2 sets - 10 reps - 5 hold ?- Isometric Shoulder Flexion  - 2 x daily - 6 x weekly - 1-2 sets - 10 reps - 5 hold ?- Isometric Shoulder External Rotation  - 2 x daily - 6 x weekly - 1-2 sets - 10 reps - 5 hold ?- Isometric Shoulder Internal Rotation  - 2 x daily - 6 x weekly - 1-2 sets - 10 reps - 5 hold ?- Isometric Shoulder Abduction at Wall  - 2 x daily - 6 x weekly - 1-2 sets - 10 reps - 5 hold ?- Seated Scapular Retraction  - 2 x daily - 6 x weekly - 2 sets - 10 reps - 5 sec hold ?- Seated Shoulder External Rotation AAROM with Dowel  - 2 x daily - 6 x weekly - 1-2 sets - 10 reps ?- Seated Shoulder Abduction AAROM with Dowel  - 2 x daily - 6 x weekly - 1-2 sets - 10 reps ?- Seated Shoulder Flexion AAROM with Dowel  - 2 x daily - 6 x weekly - 1-2 sets - 10 reps ?  ?  ?  ?ASSESSMENT: ?  ?CLINICAL IMPRESSION: ?Pt presenting today with 4/10 pain in her Rt shoulder. Pt still with limited strength against gravity and overall ROM. We instructed pt with isometrics against the wall which pt was able to perform better than against her own resistance.Pt was instructed to perform 4 way shoulder isometrics with wall resistance at home in standing position.  Continue with skilled PT to maximize pt's function.  ? ? ?OBJECTIVE IMPAIRMENTS: decreased activity tolerance, decreased endurance, decreased mobility, decreased ROM, decreased strength, impaired flexibility, impaired LE use, postural dysfunction, and pain. ?  ?ACTIVITY LIMITATIONS:  lifting, carry,  cleaning, dressing, bathing, driving, and or occupation ?  ?REHAB POTENTIAL: Good ?  ?CLINICAL DECISION MAKING: stable/uncomplicated ?  ?EVALUATION COMPLEXITY: Low ?  ?  ?GOALS: ?Short term PT Goals Target date: 08/12/2021 ?Pt will be I and compliant with HEP. ?Baseline:  ?Goal status: MET ?Pt will improve Rt shoulder abduction and flexion PROM >90 deg ?Baseline: ?Goal status:  mostly met 90 flexion 85 abuction ?  ?Long term PT goals Target date: 10/07/2021 ?Pt will improve Rt shoulder ROM to [facility] to improve functional mobility ?Baseline: ?Goal status: Ongoing 08/26/2021 ?      2.    Pt will

## 2021-08-28 ENCOUNTER — Encounter: Payer: Self-pay | Admitting: Rehabilitative and Restorative Service Providers"

## 2021-08-28 ENCOUNTER — Ambulatory Visit (INDEPENDENT_AMBULATORY_CARE_PROVIDER_SITE_OTHER): Payer: Medicare HMO | Admitting: Rehabilitative and Restorative Service Providers"

## 2021-08-28 DIAGNOSIS — M25511 Pain in right shoulder: Secondary | ICD-10-CM | POA: Diagnosis not present

## 2021-08-28 DIAGNOSIS — M6281 Muscle weakness (generalized): Secondary | ICD-10-CM

## 2021-08-28 DIAGNOSIS — R600 Localized edema: Secondary | ICD-10-CM

## 2021-08-28 NOTE — Therapy (Signed)
?OUTPATIENT PHYSICAL THERAPY TREATMENT/PROGRESS NOTE ? ? ?Patient Name: Joanna Ray ?MRN: [id number] ?DOB:10/27/1950, 71 y.o., female ?Today's Date: 08/28/2021 ? ?PCP: [name], MD ?REFERRING PROVIDER: Janice Coffin, MD ? ?Referring diagnosis? 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 (if different than referring diagnosis) M62.81  M25.511  R60.0 ?What was this (referring dx) caused by? ?'[x]'$  Surgery ?'[x]'$  Fall ?'[]'$  Ongoing issue ?'[]'$  Arthritis ?'[]'$  Other: ____________ ? ?Laterality: ?'[x]'$  Rt ?'[]'$  Lt ?'[]'$  Both ? ?Check all possible CPT codes:  *CHOOSE 10 OR LESS*    ?'[x]'$  97110 (Therapeutic Exercise)  '[]'$  92507 (SLP Treatment)  ?'[x]'$  [id number] (Neuro Re-ed)   '[]'$  92526 (Swallowing Treatment)  ? '[]'$  [id number] (Gait Training)   '[]'$  [id number] (Cognitive Training, 1st 15 minutes) ?'[x]'$  97140 (Manual Therapy)   '[]'$  97130 (Cognitive Training, each add'l 15 minutes)  ?'[]'$  [id number] (Re-evaluation)                              '[]'$  Other, List CPT Code ____________  ?'[x]'$  84132 (Therapeutic Activities)     ?'[x]'$  97535 (Self Care)  ? '[x]'$  All codes above (97110 - 97535) ? '[]'$  [id number] (Mechanical Traction) ? '[]'$  97014 (E-stim Unattended) ? '[]'$  97032 (E-stim manual) ? '[]'$  97033 (Ionto) ? '[]'$  97035 (Ultrasound) ? '[]'$  [phone] Therapist, art) ?'[x]'$  27253 (Physical Performance Training) ?'[]'$  66440 (Aquatic Therapy) ?'[]'$  34742 (Contrast Bath) ?'[]'$  97018 (Paraffin) ?'[]'$  97597 (Wound Care 1st 20 sq cm) ?'[]'$  97598 (Wound Care each add'l 20 sq cm) ?'[]'$  97016 (Vasopneumatic Device) ?'[]'$  [phone] Comptroller) ?'[]'$  [id number] (Prosthetic Training)  ?END OF SESSION:  ? PT End of Session - 08/28/21 1042   ? ? Visit Number 10   ? Number of Visits 24   ? Date for PT Re-Evaluation 10/07/21   ? Authorization Type [employer] MCR   ? PT Start Time 1018   ? PT Stop Time 1059   ? PT Time  Calculation (min) 41 min   ? Activity Tolerance Patient tolerated treatment well;No increased pain   ? Behavior During Therapy [facility] for tasks assessed/performed   ? ?  ?  ? ?  ? ? ? ? ? ?Past Medical History:  ?Diagnosis Date  ? Anxiety   ? GERD (gastroesophageal reflux disease)   ? Hyperlipidemia   ? Hypertension   ? Urticaria   ? ?Past Surgical History:  ?Procedure Laterality Date  ? CATARACT EXTRACTION, BILATERAL Bilateral   ? DILATION AND CURETTAGE OF UTERUS    ? ?Patient Active Problem List  ? Diagnosis Date Noted  ? Right shoulder pain 08/26/2020  ? Plasmacytoma ([place]) 06/28/2020  ? Angioedema 01/21/2019  ? Vertigo 09/28/2016  ? Hyperlipidemia 09/25/2011  ? Hypertension   ? ? ? ?THERAPY DIAG:  ?Muscle weakness (generalized) ? ?Right shoulder pain, unspecified chronicity ? ?Acute pain of right shoulder ? ?Localized edema ? ? ? ?PCP: [name], MD ?  ?REFERRING PROVIDER: Janice Coffin, MD ?  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  ?  ?ONSET DATE: 05/21/21 S/P right proximal humerus replacement ?  ?SUBJECTIVE:                                                                                                                                                                                    ?  ?  SUBJECTIVE STATEMENT: ?Jilliana notes improvements in her R shoulder since starting PT.  Motion is particularly improved.  She notes R shoulder weakness continues to limit function. ?  ?  ?PAIN:  ?Are you having pain? Yes: NPRS scale: 4/10 ?Pain location: Rt shoulder ?Pain description: sore with reaching, unable to function overhead ?Aggravating factors: getting dressed, R shoulder use, particularly reaching and overhead ?Relieving factors: rest and exercises ?  ?PRECAUTIONS: S/P right proximal humerus replacement 05/01/21,  start with PROM 0-90 forward flexion up to 120 by 6 weeks. Abd to 120, ER to 40, IR to 60. Add AROM to same limits. Strengthening to add at 3-4 weeks max lifting 15#" ?  ?WEIGHT BEARING RESTRICTIONS None listed however limit weight bearing to Rt UE at this time due to post op status ? 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  ?PLOF: Independent ?  ?PATIENT GOALS : Be able to comb and fix her hair and be able to raise her arm again ?  ?OBJECTIVE:  ?  ?DIAGNOSTIC FINDINGS:  ?XR in care everywhere section ?  ?PATIENT SURVEYS:  ?FOTO 45% funcitonal, goal is 48% ? ?  ?UPPER EXTREMITY ROM:  ?  ? AROM/PROM Right ?supine ?07/15/2021 Right  ?sitting ?08/07/21 Right ?Standing ?08/21/21 Right  ?Supine ?Passive ?08/26/21 Right Supine Passive 08/28/2021  ?Shoulder flexion 0/50  10/90 60 140 135  ?Shoulder extension 40/       ?Shoulder abduction 30/70  50/85 80 110   ?Shoulder adduction         ?Shoulder internal rotation 50/60 at 45 deg abd    75 ?Shoulder abd 45 degrees 70 degrees at 70 degrees abduction  ?Shoulder external rotation 10/25 at 45 deg abd  30/ 55/ 70  ?Shoulder abd to 45 degrees  60 degrees at 70 degrees abduction   ?(Blank rows = not tested) ?  ?UPPER EXTREMITY MMT: ?  ?MMT Right ?07/15/2021  Right ?08/12/21 L/R in pounds 08/28/2021  ?Shoulder flexion 2  2   ?Shoulder extension 3  3+   ?Shoulder abduction 2  2   ?Shoulder adduction       ?Shoulder internal rotation 3  3+ 40.1/5.3  ?Shoulder external rotation 2  2+ 25.1/<3 (0.0)  ?Middle trapezius       ?Lower trapezius       ?Elbow flexion       ?Elbow extension       ?Grip strength (lbs)       ?(Blank rows = not tested) ?  ?  ?PALPATION:  ?07/15/21: TTP Rt anterior shoulder ? ?TODAY'S TREATMENT ?08/28/2021: ? Therapeutic Exercises: ? Supine arm raises (Scapular protraction) 2 sets of 10 for 3 seconds Side lie ER 2 sets of 10 for 3 seconds ?  ?UBE (Push/Pull/Rest :20 intervals for 6 minutes ?Theraband IR/ER Yellow with help from the L side for full range and slow  eccentrics ?Pull to chest Red 20X 3 seconds ? ? Reassessment completed (needs FOTO) ? ? ?08/26/21: ?TherEX: ?UBE L3 x 3 minutes each direction ?Pulleys 2 min flexion, 2 min abd ?Standing UE ranger 8 inch from bottom for flexion X10 and circles CW,CCW X 10 ?Standing Wall ladder X 10 flexion and X 10 scaption holding 5 sec ?Standing Level 2 band ER X 15 each on Rt ?Standing Rows: Level 3 band x 15 on Rt ?Shoulder Isometrics x 10 holding 5 seconds each c flexion, extension, IR and ER ?Modalities: ?Vasopnuematic to Rt shoulder X10 minutes lowest temperature low compression ? ? ?  08/21/21 ?UBE L3 2.5 min fwd, 2.5  min reverse ?Pulleys 2 min flexion, 2 min abd ?Standing UE ranger 8 inch from bottom for flexion X10 and circles CW,CCW X 10 ?Standing Wall ladder X 10 flexion and X 10 scaption holding 5 sec ?Standing red band rows, extensions, ER X 15 each on Rt ?Vasopnuematic to Rt shoulder X10 minutes lowest temperature low compression ? ? ? ?  ?PATIENT EDUCATION: ?08/28/21 ?Education details: Reviewed reassessment findings and updated HEP with strength emphasis ?Person educated: Patient ?Education method: Explanation, Demonstration, Verbal cues, and Handouts ?Education comprehension: verbalized understanding, returned demonstration, and needs further education ?  ?  ?HOME EXERCISE PROGRAM: ? Access Code: [email or web address] ?URL: [email or web address] ?Date: 08/28/2021 ?Prepared by: Vista Mink ? ?Exercises ?- Seated Shoulder Flexion Towel Slide at Table Top  - 2 x daily - 6 x weekly - 1-2 sets - 10 reps ?- Seated Shoulder External Rotation PROM on Table  - 2 x daily - 6 x weekly - 1-2 sets - 10 reps - 5 hold ?- Seated Shoulder Abduction Towel Slide at Table Top  - 2 x daily - 6 x weekly - 1-2 sets - 10 reps - 5 hold ?- Isometric Shoulder Flexion  - 2 x daily - 6 x weekly - 1-2 sets - 10 reps - 5 hold ?- Isometric Shoulder External Rotation  - 2 x daily - 6 x weekly - 1-2 sets - 10 reps - 5 hold ?- Isometric Shoulder  Internal Rotation  - 2 x daily - 6 x weekly - 1-2 sets - 10 reps - 5 hold ?- Isometric Shoulder Abduction at Wall  - 2 x daily - 6 x weekly - 1-2 sets - 10 reps - 5 hold ?- Seated Scapular Retraction  - 2 x daily - 6 x w

## 2021-09-02 ENCOUNTER — Encounter: Payer: Medicare HMO | Admitting: Physical Therapy

## 2021-09-04 ENCOUNTER — Encounter: Payer: Medicare HMO | Admitting: Physical Therapy

## 2021-09-10 ENCOUNTER — Encounter: Payer: Self-pay | Admitting: Physical Therapy

## 2021-09-10 ENCOUNTER — Ambulatory Visit (INDEPENDENT_AMBULATORY_CARE_PROVIDER_SITE_OTHER): Payer: Medicare HMO | Admitting: Physical Therapy

## 2021-09-10 DIAGNOSIS — R600 Localized edema: Secondary | ICD-10-CM

## 2021-09-10 DIAGNOSIS — M25511 Pain in right shoulder: Secondary | ICD-10-CM

## 2021-09-10 DIAGNOSIS — M6281 Muscle weakness (generalized): Secondary | ICD-10-CM | POA: Diagnosis not present

## 2021-09-10 NOTE — Therapy (Signed)
?OUTPATIENT PHYSICAL THERAPY TREATMENT/PROGRESS NOTE ? ? ?Patient Name: Joanna Ray ?MRN: [id number] ?DOB:01-03-1951, 71 y.o., female ?Today's Date: 09/10/2021 ? ?PCP: [name], MD ?REFERRING PROVIDER: Janice Coffin, MD ? ?END OF SESSION:  ? PT End of Session - 09/10/21 1617   ? ? Visit Number 11   ? Number of Visits 24   ? Date for PT Re-Evaluation 10/07/21   ? Authorization Type [employer] MCR   ? PT Start Time 0400   ? PT Stop Time 0453   ? PT Time Calculation (min) 53 min   ? Activity Tolerance Patient tolerated treatment well;No increased pain   ? Behavior During Therapy [facility] for tasks assessed/performed   ? ?  ?  ? ?  ? ? ? ? ? ?Past Medical History:  ?Diagnosis Date  ? Anxiety   ? GERD (gastroesophageal reflux disease)   ? Hyperlipidemia   ? Hypertension   ? Urticaria   ? ?Past Surgical History:  ?Procedure Laterality Date  ? CATARACT EXTRACTION, BILATERAL Bilateral   ? DILATION AND CURETTAGE OF UTERUS    ? ?Patient Active Problem List  ? Diagnosis Date Noted  ? Right shoulder pain 08/26/2020  ? Plasmacytoma ([place]) 06/28/2020  ? Angioedema 01/21/2019  ? Vertigo 09/28/2016  ? Hyperlipidemia 09/25/2011  ? Hypertension   ? ? ? ?THERAPY DIAG:  ?Muscle weakness (generalized) ? ?Right shoulder pain, unspecified chronicity ? ?Acute pain of right shoulder ? ?Localized edema ? ? ? ?PCP: [name], MD ?  ?REFERRING PROVIDER: Janice Coffin, MD ?  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  ?  ?ONSET DATE: 05/21/21 S/P right proximal humerus replacement ?  ?SUBJECTIVE:                                                                                                                                                                                    ?  ?SUBJECTIVE STATEMENT: ?Joanna Ray has just returned from [place] so  has not been able to do her HEP over the last 10 days ?  ?  ?PAIN:  ?Are you having pain? Yes: NPRS scale: 3/10 ?Pain location: Rt shoulder ?Pain description: sore with reaching, unable to function overhead ?Aggravating factors: getting dressed, R shoulder use, particularly reaching and overhead ?Relieving factors: rest and exercises ?  ?PRECAUTIONS: S/P right proximal humerus replacement 05/01/21, start with PROM 0-90 forward flexion up to 120 by 6 weeks. Abd to 120, ER to 40, IR to 60. Add AROM to same limits. Strengthening to add at 3-4  weeks max lifting 15#" ?  ?WEIGHT BEARING RESTRICTIONS None listed however limit weight bearing to Rt UE at this time due to post op status ? 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  ?PLOF: Independent ?  ?PATIENT GOALS : Be able to comb and fix her hair and be able to raise her arm again ?  ?OBJECTIVE:  ?  ?DIAGNOSTIC FINDINGS:  ?XR in care everywhere section ?  ?PATIENT SURVEYS:  ?FOTO 45% funcitonal, goal is 48% ? ?  ?UPPER EXTREMITY ROM:  ?  ? AROM/PROM Right ?supine ?07/15/2021 Right  ?sitting ?08/07/21 Right ?Standing ?08/21/21 Right  ?Supine ?Passive ?08/26/21 Right Supine Passive 08/28/2021 Right ?Active in standing  ?Shoulder flexion 0/50  10/90 60 140 135 60  ?Shoulder extension 40/        ?Shoulder abduction 30/70  50/85 80 110  80 with elbow in 90 deg of flexiona  ?Shoulder adduction          ?Shoulder internal rotation 50/60 at 45 deg abd    75 ?Shoulder abd 45 degrees 70 degrees at 70 degrees abduction   ?Shoulder external rotation 10/25 at 45 deg abd  30/ 55/ 70  ?Shoulder abd to 45 degrees  60 degrees at 70 degrees abduction    ?(Blank rows = not tested) ?  ?UPPER EXTREMITY MMT: ?  ?MMT Right ?07/15/2021  Right ?08/12/21 L/R in pounds 08/28/2021  ?Shoulder flexion 2  2   ?Shoulder extension 3  3+   ?Shoulder abduction 2  2   ?Shoulder adduction       ?Shoulder internal rotation 3  3+ 40.1/5.3  ?Shoulder external rotation 2  2+ 25.1/<3 (0.0)  ?Middle trapezius        ?Lower trapezius       ?Elbow flexion       ?Elbow extension       ?Grip strength (lbs)       ?(Blank rows = not tested) ?  ?  ?PALPATION:  ?07/15/21: TTP Rt anterior shoulder ? ?TODAY'S TREATMENT ?09/10/21: ?TherEX: ?UBE L3 x 3 minutes each direction ?Pulleys 2 min flexion, 2 min abd ?Standing bilat shoulder abd with short lever (elbows bent to 90 deg) 2X10 ?Standing shoulder flexion AAROM 2X10 (hands clasped) ?Standing UE ranger 8 inch from bottom for flexion X10 and circles CW,CCW X 10 ?Standing Wall ladder X 10 flexion and X 10 scaption holding 5 sec ?Standing Level 2 band ER step outs with isometric hold 2X10 ?Standing Rows: Level 3 band x 15 on Rt ?Vasopnuematic to Rt shoulder X10 minutes lowest temperature low compression ? ? ?  ?PATIENT EDUCATION: ?08/28/21 ?Education details: Reviewed reassessment findings and updated HEP with strength emphasis ?Person educated: Patient ?Education method: Explanation, Demonstration, Verbal cues, and Handouts ?Education comprehension: verbalized understanding, returned demonstration, and needs further education ?  ?  ?HOME EXERCISE PROGRAM: ? Access Code: [email or web address] ?URL: [email or web address] ?Date: 08/28/2021 ?Prepared by: Vista Mink ? ?Exercises ?- Seated Shoulder Flexion Towel Slide at Table Top  - 2 x daily - 6 x weekly - 1-2 sets - 10 reps ?- Seated Shoulder External Rotation PROM on Table  - 2 x daily - 6 x weekly - 1-2 sets - 10 reps - 5 hold ?- Seated Shoulder Abduction Towel Slide at Table Top  - 2 x daily - 6 x weekly - 1-2 sets - 10 reps - 5 hold ?- Isometric Shoulder Flexion  - 2 x daily - 6 x weekly - 1-2 sets - 10 reps -  5 hold ?- Isometric Shoulder External Rotation  - 2 x daily - 6 x weekly - 1-2 sets - 10 reps - 5 hold ?- Isometric Shoulder Internal Rotation  - 2 x daily - 6 x weekly - 1-2 sets - 10 reps - 5 hold ?- Isometric Shoulder Abduction at Wall  - 2 x daily - 6 x weekly - 1-2 sets - 10 reps - 5 hold ?- Seated Scapular Retraction  - 2 x  daily - 6 x weekly - 2 sets - 10 reps - 5 sec hold ?- Seated Shoulder External Rotation AAROM with Dowel  - 2 x daily - 6 x weekly - 1-2 sets - 10 reps ?- Seated Shoulder Abduction AAROM with Dowel  - 2 x daily - 6 x weekly - 1-2 sets - 10 reps ?- Seated Shoulder Flexion AAROM with Dowel  - 2 x daily - 6 x weekly - 1-2 sets - 10 reps ?- Supine Scapular Protraction in Flexion with Dumbbells  - 2-3 x daily - 7 x weekly - 1 sets - 20 reps - 3 seconds hold ?  ?ASSESSMENT: ?  ?CLINICAL IMPRESSION: ?Joanna Ray is doing well with pain and her PROM is improving well however her AROM continues to be limited due to significant Rt shoulder weakness. We are continuing to work to improve this as able. ? ? ?OBJECTIVE IMPAIRMENTS: decreased activity tolerance, decreased endurance, decreased mobility, decreased ROM, decreased strength, impaired flexibility, impaired LE use, postural dysfunction, and pain. ?  ?ACTIVITY LIMITATIONS:  lifting, carry,  cleaning, dressing, bathing, driving, and or occupation ?  ?REHAB POTENTIAL: Good ?  ?CLINICAL DECISION MAKING: stable/uncomplicated ?  ?EVALUATION COMPLEXITY: Low ?  ?  ?GOALS: ?Short term PT Goals Target date: 08/12/2021 ?Pt will be I and compliant with HEP. ?Baseline:  ?Goal status: MET ?Pt will improve Rt shoulder abduction and flexion PROM >90 deg ?Baseline: ?Goal status: MET ?  ?Long term PT goals Target date: 10/07/2021 ?Pt will improve Rt shoulder ROM to [facility] to improve functional mobility ?Baseline: ?Goal status: On going 08/28/2021 ?      2.    Pt will improve  Rt shoulder strength to at least 4/5 MMT to improve functional strength ?Baseline: ?Goal status: On going 08/28/2021 ?Pt will improve FOTO to at least 48% functional to show improved function ?Baseline: 45 ?Goal status: On going ?Pt will reduce pain to overall less than 2-3/10 with usual activity and work activity. ?Baseline: 6/10 (On Going at 4/10 08/28/2021) ?Goal status: ongoing 08/26/2021 ?Pt will be able to dress herself and fix  her hair without more than mild complaints in Rt UE. ?                 Goal status: On going 08/28/2021 ? ?PLAN: ?PT FREQUENCY: 2 times per week  ?  ?PT DURATION: 4-8 weeks ?  ?PLANNED INTERVENTIONS (unless

## 2021-09-16 ENCOUNTER — Ambulatory Visit: Payer: Medicare HMO | Admitting: Physical Therapy

## 2021-09-16 ENCOUNTER — Encounter: Payer: Self-pay | Admitting: Physical Therapy

## 2021-09-16 DIAGNOSIS — M6281 Muscle weakness (generalized): Secondary | ICD-10-CM | POA: Diagnosis not present

## 2021-09-16 DIAGNOSIS — R600 Localized edema: Secondary | ICD-10-CM

## 2021-09-16 DIAGNOSIS — M25511 Pain in right shoulder: Secondary | ICD-10-CM

## 2021-09-16 NOTE — Therapy (Signed)
OUTPATIENT PHYSICAL THERAPY TREATMENT/PROGRESS NOTE   Patient Name: Joanna Ray MRN: [id number] DOB:February 25, 1951, 71 y.o., female Today's Date: 09/16/2021  PCP: [name], MD REFERRING PROVIDER: Janice Coffin, MD  END OF SESSION:   PT End of Session - 09/16/21 1523     Visit Number 12    Number of Visits 24    Date for PT Re-Evaluation 10/07/21    Authorization Type [employer] MCR    PT Start Time 2694    PT Stop Time 1600    PT Time Calculation (min) 45 min    Activity Tolerance Patient tolerated treatment well;No increased pain    Behavior During Therapy WFL for tasks assessed/performed                Past Medical History:  Diagnosis Date   Anxiety    GERD (gastroesophageal reflux disease)    Hyperlipidemia    Hypertension    Urticaria    Past Surgical History:  Procedure Laterality Date   CATARACT EXTRACTION, BILATERAL Bilateral    DILATION AND CURETTAGE OF UTERUS     Patient Active Problem List   Diagnosis Date Noted   Right shoulder pain 08/26/2020   Plasmacytoma ([place]) 06/28/2020   Angioedema 01/21/2019   Vertigo 09/28/2016   Hyperlipidemia 09/25/2011   Hypertension      THERAPY DIAG:  Muscle weakness (generalized)  Right shoulder pain, unspecified chronicity  Acute pain of right shoulder  Localized edema    PCP: [name], MD   REFERRING PROVIDER: Janice Coffin, MD   REFERRING DIAG: [phone] (ICD-10-CM) - 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 Yes: NPRS scale: 1/10 upon arrival. Pain location: Rt shoulder Pain description: sore with reaching, unable to function overhead Aggravating factors: getting dressed, R shoulder use, particularly reaching and overhead Relieving factors: rest and exercises   PRECAUTIONS: S/P right proximal humerus replacement 05/01/21, start with PROM 0-90 forward flexion up to 120 by 6 weeks. Abd to 120, ER to 40, IR to 60. Add AROM to same limits. Strengthening to add  at 3-4 weeks max lifting 15#"   WEIGHT BEARING RESTRICTIONS None listed however limit weight bearing to Rt UE at this time due to post op status  OCCUPATION: Math and Reading Teacher to adults   PLOF: Independent   PATIENT GOALS : Be able to comb and fix her hair and be able to raise her arm again   OBJECTIVE:    DIAGNOSTIC FINDINGS:  XR in care everywhere section   PATIENT SURVEYS:  FOTO 45% funcitonal, goal is 48%    UPPER EXTREMITY ROM:     AROM/PROM Right supine 07/15/2021 Right  sitting 08/07/21 Right Standing 08/21/21 Right  Supine Passive 08/26/21 Right Supine Passive 08/28/2021 Right Active in standing 09/10/21  Shoulder flexion 0/50  10/90 60 140 135 60  Shoulder extension 40/        Shoulder abduction 30/70  50/85 80 110  80 with elbow in 90 deg of flexiona  Shoulder adduction          Shoulder internal rotation 50/60 at 45 deg abd    75 Shoulder abd 45 degrees 70 degrees at 70 degrees abduction   Shoulder external rotation 10/25 at 45 deg abd  30/ 55/ 70  Shoulder abd to 45 degrees  60 degrees at 70 degrees abduction    (Blank rows = not tested)   UPPER EXTREMITY MMT:   MMT Right 07/15/2021  Right 08/12/21 L/R in pounds 08/28/2021  Shoulder flexion 2  2   Shoulder extension 3  3+   Shoulder abduction 2  2   Shoulder adduction       Shoulder internal rotation 3  3+ 40.1/5.3  Shoulder external rotation 2   2+ 25.1/<3 (0.0)  Middle trapezius       Lower trapezius       Elbow flexion       Elbow extension       Grip strength (lbs)       (Blank rows = not tested)     PALPATION:  07/15/21: TTP Rt anterior shoulder  TODAY'S TREATMENT 09/16/21: TherEX: UBE L3 x 2.5 minutes each direction Pulleys 2 min flexion, 2 min abd Standing UE ranger 8 inch from bottom for flexion X10 and circles CW,CCW X 10 Standing bilat shoulder abd with short lever (elbows bent to 90 deg) 2X10 Standing shoulder flexion AAROM 2X10 (hands clasped) Standing Wall ladder X 10 flexion and X 10 scaption holding 5 sec Standing Level 2 band ER step outs with isometric hold 2X10 Standing Rows: Level 3 band x 15 on Rt Modalaties Vasopnuematic to Rt shoulder X10 minutes lowest temperature medium compression  09/10/21: TherEX: UBE L3 x 3 minutes each direction Pulleys 2 min flexion, 2 min abd Standing bilat shoulder abd with short lever (elbows bent to 90 deg) 2X10 Standing shoulder flexion AAROM 2X10 (hands clasped) Standing UE ranger 8 inch from bottom for flexion X10 and circles CW,CCW X 10 Standing Wall ladder X 10 flexion and X 10 scaption holding 5 sec Standing Level 2 band ER step outs with isometric hold 2X10 Standing Rows: Level 3 band x 15 on Rt Vasopnuematic to Rt shoulder X10 minutes lowest temperature low compression     PATIENT EDUCATION: 08/28/21 Education details: Reviewed reassessment findings and updated HEP with strength emphasis Person educated: Patient Education method: Explanation, Demonstration, Verbal cues, and Handouts Education comprehension: verbalized understanding, returned demonstration, and needs further education     HOME EXERCISE PROGRAM:  Access Code: [email or web address] URL: [email or web address] Date: 08/28/2021 Prepared by: Vista Mink  Exercises - Seated Shoulder Flexion Towel Slide at Table Top  - 2 x daily - 6 x weekly - 1-2 sets - 10 reps - Seated Shoulder External  Rotation PROM on Table  - 2 x daily - 6 x weekly - 1-2 sets - 10 reps - 5 hold - Seated Shoulder Abduction Towel Slide at Table Top  - 2 x daily - 6 x weekly - 1-2 sets - 10 reps - 5 hold - Isometric Shoulder Flexion  - 2 x daily - 6 x weekly - 1-2 sets - 10 reps - 5 hold - Isometric Shoulder External Rotation  - 2 x daily - 6 x weekly - 1-2 sets - 10 reps - 5 hold - Isometric Shoulder Internal Rotation  - 2 x daily - 6 x weekly - 1-2 sets - 10 reps - 5 hold - Isometric Shoulder Abduction at Wall  - 2 x daily - 6 x weekly - 1-2 sets - 10 reps - 5 hold - Seated Scapular Retraction  - 2 x daily - 6 x weekly - 2 sets - 10 reps - 5 sec hold - Seated Shoulder External Rotation AAROM with Dowel  - 2 x daily - 6 x weekly - 1-2 sets - 10 reps - Seated Shoulder Abduction AAROM with Dowel  - 2 x daily - 6 x weekly - 1-2 sets - 10 reps - Seated Shoulder Flexion AAROM with Dowel  - 2 x daily - 6 x weekly - 1-2 sets - 10 reps - Supine Scapular Protraction in Flexion with Dumbbells  - 2-3 x daily - 7 x weekly - 1 sets - 20 reps - 3 seconds hold   ASSESSMENT:   CLINICAL IMPRESSION: [place] is progressing overall but still limited by weakness in her Rt shoulder and not able to fully raise her Rt arm because of this. We will continue to work to improve this as tolerated with strength focus.   OBJECTIVE IMPAIRMENTS: decreased activity tolerance, decreased endurance, decreased mobility, decreased ROM, decreased strength, impaired flexibility, impaired LE use, postural dysfunction, and pain.   ACTIVITY LIMITATIONS:  lifting, carry,  cleaning, dressing, bathing, driving, and or occupation   REHAB POTENTIAL: Good   CLINICAL DECISION MAKING: stable/uncomplicated   EVALUATION COMPLEXITY: Low     GOALS: Short term PT Goals Target date: 08/12/2021 Pt will be I and compliant with HEP. Baseline:  Goal status: MET Pt will improve Rt shoulder abduction and flexion PROM >90 deg Baseline: Goal status: MET   Long  term PT goals Target date: 10/07/2021 Pt will improve Rt shoulder ROM to [facility] to improve functional mobility Baseline: Goal status: On going 08/28/2021       2.    Pt will improve  Rt shoulder strength to at least 4/5 MMT to improve functional strength Baseline: Goal status: On going 08/28/2021 Pt will improve FOTO to at least 48% functional to show improved function Baseline: 45 Goal status: On going Pt will reduce pain to overall less than 2-3/10 with usual activity and work activity. Baseline: 6/10 (On Going at 4/10 08/28/2021) Goal status: ongoing 08/26/2021 Pt will be able to dress herself and fix her hair without more than mild complaints in Rt UE.                  Goal status: On going 08/28/2021  PLAN: PT FREQUENCY: 2 times per week    PT DURATION: 4-8 weeks   PLANNED INTERVENTIONS (unless contraindicated): aquatic PT, Canalith repositioning, cryotherapy,  Electrical stimulation, Iontophoresis with 4 mg/ml dexamethasome, Moist heat, traction, Ultrasound, gait training, Therapeutic exercise, balance training, neuromuscular re-education, patient/family education, prosthetic training, manual techniques, passive ROM, dry needling, taping, vasopnuematic device, vestibular, spinal manipulations, joint manipulations   PLAN FOR NEXT SESSION: Progress scapular, RTC and general R UE strength as able.  15# limit per MD for strengthening.  Vaso if desired.  Debbe Odea, PT, DPT 09/16/2021, 3:24 PM

## 2021-09-26 ENCOUNTER — Telehealth: Payer: Self-pay | Admitting: Internal Medicine

## 2021-09-26 NOTE — Telephone Encounter (Addendum)
Joanna Ray [phone]  [name] dropped off FMLA forms to be filled out for her son to be able to take her and her husband to appointments. Since her husband is not a patient here, I think we can only fill it out for [place]. I did let her know this would require an office visit to determine what needs to go on paperwork. I do not see any previous FMLA paperwork.

## 2021-09-28 ENCOUNTER — Telehealth: Payer: Self-pay | Admitting: Internal Medicine

## 2021-09-28 NOTE — Telephone Encounter (Signed)
Patient is requesting FMLA paperwork be completed so that her son, Joanna Ray can be excused from work at [employer] to drive her to physical therapy 2-3 times a week.  In January of this year, she underwent right shoulder arthroplasty and resection of right upper shoulder plasmacytoma at [facility].  She began therapy by physical therapist, Elsie Ra in March 2023.  The surgery was done in January 2023 but she has only recently been able to start therapy due to the need for healing from the operation.  My guess is she will be in therapy for about 6 months.  I am not certain.  The form has been completed as requested.  I spoke with her today by telephone and confirmed that her son needed be absent from work due to this reason.

## 2021-09-29 DIAGNOSIS — Z0289 Encounter for other administrative examinations: Secondary | ICD-10-CM

## 2021-09-29 NOTE — Telephone Encounter (Signed)
Called to let her know FMLA paperwork is ready for pickup.

## 2021-09-29 NOTE — Telephone Encounter (Signed)
This message was sent via [place], a product from [employer] [email or web address]                    -------Fax Transmission Report-------  To:               Recipient at [phone] Subject:          FW: Hp Scans Result:           The transmission was successful. Explanation:      All Pages Ok Pages Sent:       6 Connect Time:     8 minutes, 35 seconds Transmit Time:    09/29/2021 14:52 Transfer Rate:    9600 Status Code:      0000 Retry Count:      0 Job Id:           [id number] Unique Id:        [id number] Fax Line:         5 Fax Server:       [employer]

## 2021-09-30 DIAGNOSIS — M84521D Pathological fracture in neoplastic disease, right humerus, subsequent encounter for fracture with routine healing: Secondary | ICD-10-CM | POA: Diagnosis not present

## 2021-09-30 DIAGNOSIS — Z96611 Presence of right artificial shoulder joint: Secondary | ICD-10-CM | POA: Diagnosis not present

## 2021-09-30 DIAGNOSIS — Z9889 Other specified postprocedural states: Secondary | ICD-10-CM | POA: Diagnosis not present

## 2021-09-30 DIAGNOSIS — C9030 Solitary plasmacytoma not having achieved remission: Secondary | ICD-10-CM | POA: Diagnosis not present

## 2021-10-01 ENCOUNTER — Ambulatory Visit (INDEPENDENT_AMBULATORY_CARE_PROVIDER_SITE_OTHER): Payer: Medicare HMO | Admitting: Rehabilitative and Restorative Service Providers"

## 2021-10-01 ENCOUNTER — Encounter: Payer: Self-pay | Admitting: Rehabilitative and Restorative Service Providers"

## 2021-10-01 DIAGNOSIS — M6281 Muscle weakness (generalized): Secondary | ICD-10-CM | POA: Diagnosis not present

## 2021-10-01 DIAGNOSIS — M25511 Pain in right shoulder: Secondary | ICD-10-CM

## 2021-10-01 DIAGNOSIS — R600 Localized edema: Secondary | ICD-10-CM

## 2021-10-01 NOTE — Therapy (Signed)
OUTPATIENT PHYSICAL THERAPY TREATMENT   Patient Name: Joanna Ray MRN: [id number] DOB:03-01-1951, 71 y.o., female Today's Date: 10/01/2021  PCP: Joanna Showers, MD REFERRING PROVIDER: Janice Coffin, MD  END OF SESSION:   PT End of Session - 10/01/21 1510     Visit Number 13    Number of Visits 24    Date for PT Re-Evaluation 10/07/21    Authorization Type [employer] MCR    PT Start Time 3893    PT Stop Time 7342    PT Time Calculation (min) 39 min    Activity Tolerance Patient tolerated treatment well;No increased pain    Behavior During Therapy WFL for tasks assessed/performed                 Past Medical History:  Diagnosis Date   Anxiety    GERD (gastroesophageal reflux disease)    Hyperlipidemia    Hypertension    Urticaria    Past Surgical History:  Procedure Laterality Date   CATARACT EXTRACTION, BILATERAL Bilateral    DILATION AND CURETTAGE OF UTERUS     Patient Active Problem List   Diagnosis Date Noted   Right shoulder pain 08/26/2020   Plasmacytoma ([place]) 06/28/2020   Angioedema 01/21/2019   Vertigo 09/28/2016   Hyperlipidemia 09/25/2011   Hypertension      THERAPY DIAG:  Muscle weakness (generalized)  Right shoulder pain, unspecified chronicity  Acute pain of right shoulder  Localized edema    PCP: Joanna Showers, MD   REFERRING PROVIDER: Janice Coffin, MD   REFERRING DIAG: [phone] (ICD-10-CM) - 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  She followed-up with her doctor.  She was pleased with the AROM but agrees strength needs more work.     PAIN:  Are you having pain? Yes: NPRS scale: 1/10 upon arrival. Pain location: Rt shoulder Pain description: Sore with reaching, unable to function overhead Aggravating factors: Getting dressed, R shoulder use, particularly reaching and overhead Relieving factors: rest and exercises   PRECAUTIONS: S/P right proximal humerus replacement 05/01/21, start with PROM 0-90 forward flexion up to 120  by 6 weeks. Abd to 120, ER to 40, IR to 60. Add AROM to same limits. Strengthening to add at 3-4 weeks max lifting 15#"   WEIGHT BEARING RESTRICTIONS None listed however limit weight bearing to Rt UE at this time due to post op status  OCCUPATION: Math and Reading Teacher to adults   PLOF: Independent   PATIENT GOALS : Be able to comb and fix her hair and be able to raise her arm again   OBJECTIVE:    DIAGNOSTIC FINDINGS:  XR in care everywhere section   PATIENT SURVEYS:  FOTO 45% funcitonal, goal is 48%    UPPER EXTREMITY ROM:     AROM/PROM Right supine 07/15/2021 Right  sitting 08/07/21 Right Standing 08/21/21 Right  Supine Passive 08/26/21 Right Supine Passive 08/28/2021 Right Active in standing 09/10/21 R passive supine   Shoulder flexion 0/50  10/90 60 140 135 60 120 degrees  Shoulder extension 40/         Shoulder abduction 30/70  50/85 80 110  80 with elbow in 90 deg of flexiona   Shoulder horizontal adduction adduction         35 degrees  Shoulder internal rotation 50/60 at 45 deg abd    75 Shoulder abd 45 degrees 70 degrees at 70 degrees abduction  110 degrees   Shoulder external rotation 10/25 at 45 deg abd  30/ 55/ 70  Shoulder abd to 45 degrees  60 degrees at 70 degrees abduction   100 degrees  (Blank rows = not tested)   UPPER EXTREMITY MMT:   MMT Right 07/15/2021  Right 08/12/21 L/R in pounds 08/28/2021   Shoulder flexion 2  2   Shoulder extension 3  3+   Shoulder abduction 2  2   Shoulder adduction       Shoulder internal rotation 3  3+ 40.1/5.3  Shoulder external rotation 2  2+ 25.1/<3 (0.0)  Middle trapezius       Lower trapezius       Elbow flexion       Elbow extension       Grip strength (lbs)       (Blank rows = not tested)     PALPATION:  07/15/21: TTP Rt anterior shoulder  TODAY'S TREATMENT 10/01/21: Supine arm raises 2 sets of 10 with 1# weight Side lie ER 2 sets of 10 0# Pulley flexion and scation with decompress (palm in, reach out 1st) 10X 10 seconds each Scapular retraction 10X 5 seconds UBE 8 minutes (:20 intervals, push/pull/rest)   09/16/21: TherEX: UBE L3 x 2.5 minutes each direction Pulleys 2 min flexion, 2 min abd Standing UE ranger 8 inch from bottom for flexion X10 and circles CW,CCW X 10 Standing bilat shoulder abd with short lever (elbows bent to 90 deg) 2X10 Standing shoulder flexion AAROM 2X10 (hands clasped) Standing Wall ladder X 10 flexion and X 10 scaption holding 5 sec Standing Level 2 band ER step outs with isometric hold 2X10 Standing Rows: Level 3 band x 15 on Rt Modalaties Vasopnuematic to Rt shoulder X10 minutes lowest temperature medium compression  09/10/21: TherEX: UBE L3 x 3 minutes each direction Pulleys 2 min flexion, 2 min abd Standing bilat shoulder abd with short lever (elbows bent to 90 deg) 2X10 Standing shoulder flexion AAROM 2X10 (hands clasped) Standing UE ranger 8 inch from bottom for flexion X10 and circles CW,CCW X 10 Standing Wall ladder X 10 flexion and X 10 scaption holding 5 sec Standing Level 2 band  ER step outs with isometric hold 2X10 Standing Rows: Level 3 band x 15 on Rt Vasopnuematic to Rt shoulder X10 minutes lowest temperature low compression     PATIENT EDUCATION: 08/28/21 Education details: Reviewed reassessment findings and updated HEP with strength emphasis Person educated: Patient Education  method: Explanation, Demonstration, Verbal cues, and Handouts Education comprehension: verbalized understanding, returned demonstration, and needs further education     HOME EXERCISE PROGRAM:  Access Code: [email or web address] URL: [email or web address] Date: 08/28/2021 Prepared by: Joanna Ray  Exercises - Seated Shoulder Flexion Towel Slide at Table Top  - 2 x daily - 6 x weekly - 1-2 sets - 10 reps - Seated Shoulder External Rotation PROM on Table  - 2 x daily - 6 x weekly - 1-2 sets - 10 reps - 5 hold - Seated Shoulder Abduction Towel Slide at Table Top  - 2 x daily - 6 x weekly - 1-2 sets - 10 reps - 5 hold - Isometric Shoulder Flexion  - 2 x daily - 6 x weekly - 1-2 sets - 10 reps - 5 hold - Isometric Shoulder External Rotation  - 2 x daily - 6 x weekly - 1-2 sets - 10 reps - 5 hold - Isometric Shoulder Internal Rotation  - 2 x daily - 6 x weekly - 1-2 sets - 10 reps - 5 hold - Isometric Shoulder Abduction at Wall  - 2 x daily - 6 x weekly - 1-2 sets - 10 reps - 5 hold - Seated Scapular Retraction  - 2 x daily - 6 x weekly - 2 sets - 10 reps - 5 sec hold - Seated Shoulder External Rotation AAROM with Dowel  - 2 x daily - 6 x weekly - 1-2 sets - 10 reps - Seated Shoulder Abduction AAROM with Dowel  - 2 x daily - 6 x weekly - 1-2 sets - 10 reps - Seated Shoulder Flexion AAROM with Dowel  - 2 x daily - 6 x weekly - 1-2 sets - 10 reps - Supine Scapular Protraction in Flexion with Dumbbells  - 2-3 x daily - 7 x weekly - 1 sets - 20 reps - 3 seconds hold   ASSESSMENT:   CLINICAL IMPRESSION: Joylynn had a positive follow-up with her orthopedic doctor who agrees she needs more time with her R shoulder strengthening.  We added (or reinforced) 4 exercises with her HEP focused on scapular and (whatever is intact of her) RTC strength.  Continue strength work to help with reaching and overhead function.   OBJECTIVE IMPAIRMENTS: decreased activity tolerance, decreased endurance, decreased  mobility, decreased ROM, decreased strength, impaired flexibility, impaired LE use, postural dysfunction, and pain.   ACTIVITY LIMITATIONS:  lifting, carry,  cleaning, dressing, bathing, driving, and or occupation   REHAB POTENTIAL: Good   CLINICAL DECISION MAKING: stable/uncomplicated   EVALUATION COMPLEXITY: Low     GOALS: Short term PT Goals Target date: 08/12/2021 Pt will be I and compliant with HEP. Baseline:  Goal status: MET Pt will improve Rt shoulder abduction and flexion PROM >90 deg Baseline: Goal status: MET   Long term PT goals Target date: 10/07/2021 Pt will improve Rt shoulder ROM to [facility] to improve functional mobility Baseline: Goal status: On going 08/28/2021       2.    Pt will improve  Rt shoulder strength to at least 4/5 MMT to improve functional strength Baseline: Goal status: On going 08/28/2021 Pt will improve FOTO to at least 48% functional to show improved function  Baseline: 45 Goal status: On going Pt will reduce pain to overall less than 2-3/10 with usual activity and work activity. Baseline: 6/10 (On Going at 4/10 08/28/2021) Goal status: ongoing 08/26/2021 Pt will be able to dress herself and fix her hair without more than mild complaints in Rt UE.                  Goal status: On going 08/28/2021  PLAN: PT FREQUENCY: 2 times per week    PT DURATION: 4-8 weeks   PLANNED INTERVENTIONS (unless contraindicated): aquatic PT, Canalith repositioning, cryotherapy, Electrical stimulation, Iontophoresis with 4 mg/ml dexamethasome, Moist heat, traction, Ultrasound, gait training, Therapeutic exercise, balance training, neuromuscular re-education, patient/family education, prosthetic training, manual techniques, passive ROM, dry needling, taping, vasopnuematic device, vestibular, spinal manipulations, joint manipulations   PLAN FOR NEXT SESSION: Progress scapular, RTC and general R UE strength as able.  15# limit per MD for strengthening.  Vaso if desired.  Farley Ly, PT, MPT 10/01/2021, 3:20 PM

## 2021-10-02 ENCOUNTER — Other Ambulatory Visit: Payer: Self-pay

## 2021-10-02 MED ORDER — VALSARTAN-HYDROCHLOROTHIAZIDE 160-25 MG PO TABS: 1.0000 | ORAL_TABLET | Freq: Every day | ORAL | 1 refills | Status: DC

## 2021-10-02 NOTE — Telephone Encounter (Signed)
Completed page 3 and re faxed   This message was sent via [employer], a product from [employer] [email or web address]                    -------Fax Transmission Report-------  To:               Recipient at [phone] Subject:          FW: Hp Scans Result:           The transmission was successful. Explanation:      All Pages Ok Pages Sent:       5 Connect Time:     7 minutes, 38 seconds Transmit Time:    10/02/2021 11:15 Transfer Rate:    9600 Status Code:      0000 Retry Count:      0 Job Id:           [id number] Unique Id:        [id number] Fax Line:         14 Fax Server:       [email or web address]

## 2021-10-03 ENCOUNTER — Encounter: Payer: Self-pay | Admitting: Rehabilitative and Restorative Service Providers"

## 2021-10-03 ENCOUNTER — Ambulatory Visit (INDEPENDENT_AMBULATORY_CARE_PROVIDER_SITE_OTHER): Payer: Medicare HMO | Admitting: Rehabilitative and Restorative Service Providers"

## 2021-10-03 DIAGNOSIS — M25511 Pain in right shoulder: Secondary | ICD-10-CM | POA: Diagnosis not present

## 2021-10-03 DIAGNOSIS — M6281 Muscle weakness (generalized): Secondary | ICD-10-CM | POA: Diagnosis not present

## 2021-10-03 DIAGNOSIS — R600 Localized edema: Secondary | ICD-10-CM | POA: Diagnosis not present

## 2021-10-03 NOTE — Therapy (Signed)
OUTPATIENT PHYSICAL THERAPY TREATMENT   Patient Name: Joanna Ray MRN: [id number] DOB:[birth year], 71 y.o., female Today's Date: 10/03/2021  PCP: Joanna Showers, MD REFERRING PROVIDER: Janice Coffin, MD  END OF SESSION:   PT End of Session - 10/03/21 1514     Visit Number 14    Number of Visits 24    Date for PT Re-Evaluation 10/07/21    Authorization Type [employer] MCR    PT Start Time 6568    PT Stop Time 1552    PT Time Calculation (min) 41 min    Activity Tolerance Patient tolerated treatment well;No increased pain    Behavior During Therapy WFL for tasks assessed/performed                  Past Medical History:  Diagnosis Date   Anxiety    GERD (gastroesophageal reflux disease)    Hyperlipidemia    Hypertension    Urticaria    Past Surgical History:  Procedure Laterality Date   CATARACT EXTRACTION, BILATERAL Bilateral    DILATION AND CURETTAGE OF UTERUS     Patient Active Problem List   Diagnosis Date Noted   Right shoulder pain 08/26/2020   Plasmacytoma ([place]) 06/28/2020   Angioedema 01/21/2019   Vertigo 09/28/2016   Hyperlipidemia 09/25/2011   Hypertension      THERAPY DIAG:  Muscle weakness (generalized)  Right shoulder pain, unspecified chronicity  Acute pain of right shoulder  Localized edema    PCP: Joanna Showers, MD   REFERRING PROVIDER: Janice Coffin, MD   REFERRING DIAG: [phone] (ICD-10-CM) - 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  She followed-up with her doctor.  She was pleased with the AROM but agrees strength needs more work.     PAIN:  Are you having pain? Yes: NPRS scale: 3-5/10 this week Pain location: Rt shoulder Pain description: Sore with reaching, unable to function overhead Aggravating factors: Getting dressed, R shoulder use, particularly reaching and overhead Relieving factors: Rest and exercises, pain medication   PRECAUTIONS: S/P right proximal humerus replacement 05/01/21, start with PROM 0-90 forward flexion  up to 120 by 6 weeks. Abd to 120, ER to 40, IR to 60. Add AROM to same limits. Strengthening to add at 3-4 weeks max lifting 15#"   WEIGHT BEARING RESTRICTIONS None listed however limit weight bearing to Rt UE at this time due to post op status  OCCUPATION: Math and Reading Teacher to adults   PLOF: Independent   PATIENT GOALS : Be able to comb and fix her hair and be able to raise her arm again   OBJECTIVE:    DIAGNOSTIC FINDINGS:  XR in care everywhere section   PATIENT SURVEYS:  FOTO 45% funcitonal, goal is 48%    UPPER EXTREMITY ROM:     AROM/PROM Right supine 07/15/2021 Right  sitting 08/07/21 Right Standing 08/21/21 Right  Supine Passive 08/26/21 Right Supine Passive 08/28/2021 Right Active in standing 09/10/21 R passive supine   Shoulder flexion 0/50  10/90 60 140 135 60 120 degrees  Shoulder extension 40/         Shoulder abduction 30/70  50/85 80 110  80 with elbow in 90 deg of flexiona   Shoulder horizontal adduction adduction         35 degrees  Shoulder internal rotation 50/60 at 45 deg abd    75 Shoulder abd 45 degrees 70 degrees at 70 degrees abduction  110 degrees   Shoulder external rotation 10/25 at 45 deg abd  30/ 55/ 70  Shoulder abd to 45 degrees  60 degrees at 70 degrees abduction   100 degrees  (Blank rows = not tested)   UPPER EXTREMITY MMT:   MMT Right 07/15/2021  Right 08/12/21 L/R in pounds  08/28/2021  Shoulder flexion 2  2   Shoulder extension 3  3+   Shoulder abduction 2  2   Shoulder adduction       Shoulder internal rotation 3  3+ 40.1/5.3  Shoulder external rotation 2  2+ 25.1/<3 (0.0)  Middle trapezius       Lower trapezius       Elbow flexion       Elbow extension       Grip strength (lbs)       (Blank rows = not tested)     PALPATION:  07/15/21: TTP Rt anterior shoulder  TODAY'S TREATMENT 10/03/2021: Supine arm raises 20 with 1# weight Side lie ER 2 sets of 10 0# Pulley flexion and scation with decompress (palm in, reach out 1st) 10X 10 seconds each Scapular retraction 5X 5 seconds UBE 8 minutes (:20 intervals, push/pull/rest) Level 5 Standing Level 2 band ER step outs with isometric hold 10 & limited range concentric/eccentric 10X IR Theraband Yellow 10X 3 seconds limited range concentric/eccentric Standing Rows/Pull to chest: Blue band x 20 B Standing UE ranger 8 inch from bottom for flexion X10 and circles CW,CCW X 10   10/01/21: Supine arm raises 2 sets of 10 with 1# weight Side lie ER 2 sets of 10 0# Pulley flexion and scation with decompress (palm in, reach out 1st) 10X 10 seconds each Scapular retraction 10X 5 seconds UBE 8 minutes (:20 intervals, push/pull/rest)   09/16/21: TherEX: UBE L3 x 2.5 minutes each direction Pulleys 2 min flexion, 2 min abd Standing UE ranger 8 inch from bottom for flexion X10 and circles CW,CCW X 10 Standing bilat shoulder abd with short lever (elbows bent to 90 deg) 2X10 Standing shoulder flexion AAROM 2X10 (hands clasped) Standing Wall ladder X 10 flexion and X 10 scaption holding 5 sec Standing Level  2 band ER step outs with isometric hold 2X10 Standing Rows: Level 3 band x 15 on Rt Modalaties Vasopnuematic to Rt shoulder X10 minutes lowest temperature medium compression    PATIENT EDUCATION: 08/28/21 Education details: Reviewed reassessment findings and updated HEP with strength emphasis Person educated:  Patient Education method: Explanation, Demonstration, Verbal cues, and Handouts Education comprehension: verbalized understanding, returned demonstration, and needs further education     HOME EXERCISE PROGRAM:  Access Code: [email or web address] URL: [email or web address] Date: 08/28/2021 Prepared by: Vista Mink  Exercises - Seated Shoulder Flexion Towel Slide at Table Top  - 2 x daily - 6 x weekly - 1-2 sets - 10 reps - Seated Shoulder External Rotation PROM on Table  - 2 x daily - 6 x weekly - 1-2 sets - 10 reps - 5 hold - Seated Shoulder Abduction Towel Slide at Table Top  - 2 x daily - 6 x weekly - 1-2 sets - 10 reps - 5 hold - Isometric Shoulder Flexion  - 2 x daily - 6 x weekly - 1-2 sets - 10 reps - 5 hold - Isometric Shoulder External Rotation  - 2 x daily - 6 x weekly - 1-2 sets - 10 reps - 5 hold - Isometric Shoulder Internal Rotation  - 2 x daily - 6 x weekly - 1-2 sets - 10 reps - 5 hold - Isometric Shoulder Abduction at Wall  - 2 x daily - 6 x weekly - 1-2 sets - 10 reps - 5 hold - Seated Scapular Retraction  - 2 x daily - 6 x weekly - 2 sets - 10 reps - 5 sec hold - Seated Shoulder External Rotation AAROM with Dowel  - 2 x daily - 6 x weekly - 1-2 sets - 10 reps - Seated Shoulder Abduction AAROM with Dowel  - 2 x daily - 6 x weekly - 1-2 sets - 10 reps - Seated Shoulder Flexion AAROM with Dowel  - 2 x daily - 6 x weekly - 1-2 sets - 10 reps - Supine Scapular Protraction in Flexion with Dumbbells  - 2-3 x daily - 7 x weekly - 1 sets - 20 reps - 3 seconds hold   ASSESSMENT:   CLINICAL IMPRESSION: Drew reports benefits to some of the modifications made during her last visit.  Specifically with protraction with the pulley.  Scapular and rotator cuff strength activities were the focus today along with functional reach and overhead function.  Continue strength work to help with reaching and overhead function.   OBJECTIVE IMPAIRMENTS: decreased activity tolerance,  decreased endurance, decreased mobility, decreased ROM, decreased strength, impaired flexibility, impaired LE use, postural dysfunction, and pain.   ACTIVITY LIMITATIONS:  lifting, carry,  cleaning, dressing, bathing, driving, and or occupation   REHAB POTENTIAL: Good   CLINICAL DECISION MAKING: stable/uncomplicated   EVALUATION COMPLEXITY: Low     GOALS: Short term PT Goals Target date: 08/12/2021 Pt will be I and compliant with HEP. Baseline:  Goal status: MET Pt will improve Rt shoulder abduction and flexion PROM >90 deg Baseline: Goal status: MET   Long term PT goals Target date: 10/07/2021 Pt will improve Rt shoulder ROM to [facility] to improve functional mobility Baseline: Goal status: On going 10/03/2021       2.    Pt will improve  Rt shoulder strength to at least 4/5 MMT to improve functional strength Baseline: Goal status: On going 10/03/2021 Pt will improve FOTO to at least 48% functional to show improved function Baseline:  45 Goal status: On going Pt will reduce pain to overall less than 2-3/10 with usual activity and work activity. Baseline: 6/10 (On Going at 4/10 08/28/2021) Goal status: Met 10/03/2021 Pt will be able to dress herself and fix her hair without more than mild complaints in Rt UE.                  Goal status: On going 10/03/2021  PLAN: PT FREQUENCY: 2 times per week    PT DURATION: 4-8 weeks   PLANNED INTERVENTIONS (unless contraindicated): aquatic PT, Canalith repositioning, cryotherapy, Electrical stimulation, Iontophoresis with 4 mg/ml dexamethasome, Moist heat, traction, Ultrasound, gait training, Therapeutic exercise, balance training, neuromuscular re-education, patient/family education, prosthetic training, manual techniques, passive ROM, dry needling, taping, vasopnuematic device, vestibular, spinal manipulations, joint manipulations   PLAN FOR NEXT SESSION: Continue to progress scapular, RTC and general R UE strength as able.  Progressions into reaching  and overhead function.  15# limit per MD for strengthening.  Vaso if desired.  Farley Ly, PT, MPT 10/03/2021, 3:57 PM

## 2021-10-06 ENCOUNTER — Encounter: Payer: Medicare HMO | Admitting: Physical Therapy

## 2021-10-07 ENCOUNTER — Inpatient Hospital Stay: Payer: Medicare HMO | Attending: Internal Medicine

## 2021-10-07 ENCOUNTER — Other Ambulatory Visit: Payer: Self-pay

## 2021-10-07 DIAGNOSIS — C9030 Solitary plasmacytoma not having achieved remission: Secondary | ICD-10-CM

## 2021-10-07 DIAGNOSIS — Z923 Personal history of irradiation: Secondary | ICD-10-CM | POA: Insufficient documentation

## 2021-10-07 LAB — CBC WITH DIFFERENTIAL (CANCER CENTER ONLY)
Abs Immature Granulocytes: 0.03 10*3/uL (ref 0.00–0.07)
Basophils Absolute: 0.1 10*3/uL (ref 0.0–0.1)
Basophils Relative: 1 %
Eosinophils Absolute: 0.1 10*3/uL (ref 0.0–0.5)
Eosinophils Relative: 3 %
HCT: 36.7 % (ref 36.0–46.0)
Hemoglobin: 12.2 g/dL (ref 12.0–15.0)
Immature Granulocytes: 1 %
Lymphocytes Relative: 23 %
Lymphs Abs: 1.1 10*3/uL (ref 0.7–4.0)
MCH: 30.5 pg (ref 26.0–34.0)
MCHC: 33.2 g/dL (ref 30.0–36.0)
MCV: 91.8 fL (ref 80.0–100.0)
Monocytes Absolute: 1.1 10*3/uL — ABNORMAL HIGH (ref 0.1–1.0)
Monocytes Relative: 22 %
Neutro Abs: 2.5 10*3/uL (ref 1.7–7.7)
Neutrophils Relative %: 50 %
Platelet Count: 182 10*3/uL (ref 150–400)
RBC: 4 MIL/uL (ref 3.87–5.11)
RDW: 13.4 % (ref 11.5–15.5)
WBC Count: 4.9 10*3/uL (ref 4.0–10.5)
nRBC: 0 % (ref 0.0–0.2)

## 2021-10-07 LAB — CMP (CANCER CENTER ONLY)
ALT: 15 U/L (ref 0–44)
AST: 23 U/L (ref 15–41)
Albumin: 4.2 g/dL (ref 3.5–5.0)
Alkaline Phosphatase: 40 U/L (ref 38–126)
Anion gap: 9 (ref 5–15)
BUN: 19 mg/dL (ref 8–23)
CO2: 28 mmol/L (ref 22–32)
Calcium: 9 mg/dL (ref 8.9–10.3)
Chloride: 103 mmol/L (ref 98–111)
Creatinine: 1.18 mg/dL — ABNORMAL HIGH (ref 0.44–1.00)
GFR, Estimated: 50 mL/min — ABNORMAL LOW (ref >60)
Glucose, Bld: 110 mg/dL — ABNORMAL HIGH (ref 70–99)
Potassium: 3.3 mmol/L — ABNORMAL LOW (ref 3.5–5.1)
Sodium: 140 mmol/L (ref 135–145)
Total Bilirubin: 1 mg/dL (ref 0.3–1.2)
Total Protein: 8.6 g/dL — ABNORMAL HIGH (ref 6.5–8.1)

## 2021-10-07 LAB — LACTATE DEHYDROGENASE: LDH: 157 U/L (ref 98–192)

## 2021-10-08 LAB — KAPPA/LAMBDA LIGHT CHAINS
Kappa free light chain: 178.1 mg/L — ABNORMAL HIGH (ref 3.3–19.4)
Kappa, lambda light chain ratio: 15.09 — ABNORMAL HIGH (ref 0.26–1.65)
Lambda free light chains: 11.8 mg/L (ref 5.7–26.3)

## 2021-10-08 LAB — BETA 2 MICROGLOBULIN, SERUM: Beta-2 Microglobulin: 3.5 mg/L — ABNORMAL HIGH (ref 0.6–2.4)

## 2021-10-08 LAB — IGG, IGA, IGM
IgA: 122 mg/dL (ref 87–352)
IgG (Immunoglobin G), Serum: 2244 mg/dL — ABNORMAL HIGH (ref 586–1602)
IgM (Immunoglobulin M), Srm: 63 mg/dL (ref 26–217)

## 2021-10-09 ENCOUNTER — Encounter: Payer: Self-pay | Admitting: Rehabilitative and Restorative Service Providers"

## 2021-10-09 ENCOUNTER — Ambulatory Visit (INDEPENDENT_AMBULATORY_CARE_PROVIDER_SITE_OTHER): Payer: Medicare HMO | Admitting: Rehabilitative and Restorative Service Providers"

## 2021-10-09 DIAGNOSIS — M6281 Muscle weakness (generalized): Secondary | ICD-10-CM

## 2021-10-09 DIAGNOSIS — M25511 Pain in right shoulder: Secondary | ICD-10-CM

## 2021-10-09 DIAGNOSIS — R600 Localized edema: Secondary | ICD-10-CM

## 2021-10-09 NOTE — Therapy (Signed)
OUTPATIENT PHYSICAL THERAPY TREATMENT   Patient Name: Joanna Ray MRN: [id number] DOB:1950-06-28, 71 y.o., female Today's Date: 10/09/2021  PCP: [name], MD REFERRING PROVIDER: Janice Coffin, MD  END OF SESSION:   Referring diagnosis? 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 (if different than referring diagnosis) M62.81   M25.511   R60.0 What was this (referring dx) caused by? _0  Surgery _1  Fall _2  Ongoing issue _3  Arthritis _4  Other: ____________  Laterality: _5  Rt _6  Lt _7  Both  Check all possible CPT codes:  *CHOOSE 10 OR LESS*    _8  97110 (Therapeutic Exercise)  _9  92507 (SLP Treatment)  _10  93570 (Neuro Re-ed)   _11  92526 (Swallowing Treatment)   _12  17793 (Gait Training)   _13  90300 (Cognitive Training, 1st 15 minutes) _14  97140 (Manual Therapy)   _15  97130 (Cognitive Training, each add'l 15 minutes)  _16  97164 (Re-evaluation)                              _17  Other, List CPT Code ____________  _18  92330 (Therapeutic Activities)     _19  07622 (Self Care)   _20  All codes above (97110 - 97535)  _21  97012 (Mechanical Traction)  _22  97014 (E-stim Unattended)  _23  97032 (E-stim manual)  _24  97033 (Ionto)  _25  97035 (Ultrasound) _26  97750 (Physical Performance Training) _27  63335 (Aquatic Therapy) _28  97016 (Vasopneumatic Device) _29  45625 (Paraffin) _30  63893 (Contrast Bath) _31  97597 (Wound Care 1st 20 sq cm) _32  97598 (Wound Care each add'l 20 sq cm) _33  97760 (Orthotic Fabrication, Fitting, Training Initial) _34  [id number] (Prosthetic Management and Training Initial) _35  [id number] (Orthotic or Prosthetic Training/ Modification Subsequent)   Progress Note Reporting Period 07/15/2021 to 10/09/2021  See note below for Objective Data and Assessment of Progress/Goals.      Past Medical History:  Diagnosis Date   Anxiety    GERD (gastroesophageal reflux disease)    Hyperlipidemia    Hypertension    Urticaria    Past Surgical History:  Procedure Laterality Date   CATARACT EXTRACTION, BILATERAL Bilateral    DILATION AND CURETTAGE OF UTERUS     Patient Active Problem List   Diagnosis Date Noted   Right shoulder pain 08/26/2020   Plasmacytoma ([place]) 06/28/2020   Angioedema 01/21/2019   Vertigo 09/28/2016   Hyperlipidemia 09/25/2011   Hypertension      THERAPY DIAG:  Muscle weakness (generalized)  Right shoulder pain, unspecified chronicity  Acute pain of right shoulder  Localized edema    PCP: [name], MD   REFERRING PROVIDER: Janice Coffin, MD   REFERRING DIAG: [phone] (ICD-10-CM) - 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  AROM looks good while strength needs more work.     PAIN:  Are you having pain? Yes: NPRS scale: 0-5/10 this week Pain location: Rt shoulder Pain description: Sore with reaching, unable to function overhead Aggravating factors: Getting dressed, R shoulder use, particularly reaching and overhead Relieving factors: Rest and exercises, pain medication   PRECAUTIONS: S/P right proximal humerus replacement 05/01/21, start with PROM 0-90 forward flexion up to 120 by 6 weeks. Abd to 120, ER to 40, IR to 60. Add AROM to same limits. Strengthening to add at 3-4 weeks max lifting 15#"    WEIGHT BEARING RESTRICTIONS None listed however limit weight bearing to Rt UE at this time due to post op status  OCCUPATION: Math and Reading Teacher to adults   PLOF: Independent   PATIENT GOALS : Be able to comb and fix her hair and be able to raise her arm again   OBJECTIVE:    DIAGNOSTIC FINDINGS:  XR in care everywhere section   PATIENT SURVEYS:  FOTO 45% funcitonal, goal is 48%    UPPER EXTREMITY ROM:     AROM/PROM Right supine 07/15/2021 Right  sitting 08/07/21 Right Standing 08/21/21 Right  Supine Passive 08/26/21 Right Supine Passive 08/28/2021 Right Active in standing 09/10/21 R passive supine 10/03/2021  Shoulder flexion 0/50  10/90 60 140 135 60 120 degrees  Shoulder extension 40/         Shoulder abduction 30/70  50/85 80 110  80 with elbow in 90 deg of flexiona   Shoulder horizontal adduction adduction         35 degrees  Shoulder internal rotation 50/60 at 45 deg abd    75 Shoulder abd 45 degrees 70 degrees at 70 degrees abduction  110 degrees   Shoulder external rotation 10/25 at 45 deg abd  30/ 55/ 70  Shoulder abd to 45 degrees  60 degrees at 70 degrees abduction   100 degrees  (Blank rows = not tested)   UPPER EXTREMITY MMT:   MMT Right 07/15/2021  Right 08/12/21 L/R in pounds 08/28/2021 R in pounds 10/09/2021  Shoulder flexion 2  2    Shoulder extension 3  3+    Shoulder abduction 2  2    Shoulder adduction        Shoulder internal rotation 3  3+ 40.1/5.3 10 pounds  Shoulder external rotation 2  2+ 25.1/<3 (0.0) < 3 pounds  Middle trapezius        Lower trapezius        Elbow flexion        Elbow extension        Grip strength (lbs)        (Blank rows = not tested)     PALPATION:  07/15/21: TTP Rt anterior shoulder  TODAY'S TREATMENT 10/09/2021: Supine arm raises 20 with 2# weight & 10X 0# full range Side lie ER 2 sets of 10 0# Pulley flexion and scation with decompress (palm in, reach out 1st) 10X 10 seconds each Scapular retraction 5X 5  seconds UBE 8 minutes (:20 intervals, push/pull/rest) Level 5 Standing Level 2 band ER step outs with isometric hold 10 & limited range concentric/eccentric 10X IR Theraband Yellow 10X 3 seconds limited range concentric/eccentric Standing Rows/Pull to chest: Blue band x 20 B Standing UE ranger 8 inch from bottom for flexion X10 and circles CW,CCW X  10  Functional Activities: Eccentric control shoulder flexion with PT 3X   10/03/2021: Supine arm raises 20 with 1# weight Side lie ER 2 sets of 10 0# Pulley flexion and scation with decompress (palm in, reach out 1st) 10X 10 seconds each Scapular retraction 5X 5 seconds UBE 8 minutes (:20 intervals, push/pull/rest) Level 5 Standing Level 2 band ER step outs with isometric hold 10 & limited range concentric/eccentric 10X IR Theraband Yellow 10X 3 seconds limited range concentric/eccentric Standing Rows/Pull to chest: Blue band x 20 B Standing UE ranger 8 inch from bottom for flexion X10 and circles CW,CCW X 10   10/01/21: Supine arm raises 2 sets of 10 with 1# weight Side lie ER 2 sets of 10 0# Pulley flexion and scation with decompress (palm in, reach out 1st) 10X 10 seconds each Scapular retraction 10X 5 seconds UBE 8 minutes (:20 intervals, push/pull/rest)     PATIENT EDUCATION: 08/28/21 Education details: Reviewed reassessment findings and updated HEP with strength emphasis Person educated: Patient Education method: Explanation, Demonstration, Verbal cues, and Handouts Education comprehension: verbalized understanding, returned demonstration, and needs further education     HOME EXERCISE PROGRAM:  Access Code: [email or web address] URL: [email or web address] Date: 08/28/2021 Prepared by: Vista Mink  Exercises - Seated Shoulder Flexion Towel Slide at Table Top  - 2 x daily - 6 x weekly - 1-2 sets - 10 reps - Seated Shoulder External Rotation PROM on Table  - 2 x daily - 6 x weekly - 1-2 sets - 10 reps - 5 hold - Seated  Shoulder Abduction Towel Slide at Table Top  - 2 x daily - 6 x weekly - 1-2 sets - 10 reps - 5 hold - Isometric Shoulder Flexion  - 2 x daily - 6 x weekly - 1-2 sets - 10 reps - 5 hold - Isometric Shoulder External Rotation  - 2 x daily - 6 x weekly - 1-2 sets - 10 reps - 5 hold - Isometric Shoulder Internal Rotation  - 2 x daily - 6 x weekly - 1-2 sets - 10 reps - 5 hold - Isometric Shoulder Abduction at Wall  - 2 x daily - 6 x weekly - 1-2 sets - 10 reps - 5 hold - Seated Scapular Retraction  - 2 x daily - 6 x weekly - 2 sets - 10 reps - 5 sec hold - Seated Shoulder External Rotation AAROM with Dowel  - 2 x daily - 6 x weekly - 1-2 sets - 10 reps - Seated Shoulder Abduction AAROM with Dowel  - 2 x daily - 6 x weekly - 1-2 sets - 10 reps - Seated Shoulder Flexion AAROM with Dowel  - 2 x daily - 6 x weekly - 1-2 sets - 10 reps - Supine Scapular Protraction in Flexion with Dumbbells  - 2-3 x daily - 7 x weekly - 1 sets - 20 reps - 3 seconds hold   ASSESSMENT:   CLINICAL IMPRESSION: Kiera reports some HEP compliance since her last visit.  Scapular and rotator cuff strength activities remain the focus of her home and clinic program to help with functional reach and overhead function.  Shanterica is unable to reach abover shoulder height but her prognosis to improve is good with continued supervised PT and improved HEP compliance.  I recommend continued supervised strength work to help with reaching and overhead function.   OBJECTIVE IMPAIRMENTS: decreased activity tolerance, decreased endurance, decreased mobility, decreased ROM, decreased strength, impaired flexibility, impaired LE use, postural dysfunction,  and pain.   ACTIVITY LIMITATIONS:  lifting, carry,  cleaning, dressing, bathing, driving, and or occupation   REHAB POTENTIAL: Good   CLINICAL DECISION MAKING: stable/uncomplicated   EVALUATION COMPLEXITY: Low     GOALS: Short term PT Goals Target date: 08/12/2021 Pt will be I and  compliant with HEP. Baseline:  Goal status: MET Pt will improve Rt shoulder abduction and flexion PROM >90 deg Baseline: Goal status: MET   Long term PT goals Target date: 10/07/2021 Pt will improve Rt shoulder ROM to [facility] to improve functional mobility Baseline: Goal status: On going 10/09/2021       2.    Pt will improve  Rt shoulder strength to at least 4/5 MMT to improve functional strength Baseline: Goal status: On going 10/09/2021 Pt will improve FOTO to at least 48% functional to show improved function Baseline: 45 Goal status: Met 10/09/2021 Pt will reduce pain to overall less than 2-3/10 with usual activity and work activity. Baseline: 6/10 (On Going 6/152023) Goal status: On Going 10/09/2021 Pt will be able to dress herself and fix her hair without more than mild complaints in Rt UE.                  Goal status: On going 10/09/2021  PLAN: PT FREQUENCY: 2 times per week    PT DURATION: 4 weeks   PLANNED INTERVENTIONS (unless contraindicated): aquatic PT, Canalith repositioning, cryotherapy, Electrical stimulation, Iontophoresis with 4 mg/ml dexamethasome, Moist heat, traction, Ultrasound, gait training, Therapeutic exercise, balance training, neuromuscular re-education, patient/family education, prosthetic training, manual techniques, passive ROM, dry needling, taping, vasopnuematic device, vestibular, spinal manipulations, joint manipulations   PLAN FOR NEXT SESSION: Continue to progress scapular, RTC and general R UE strength as able.  Progressions into reaching and overhead function.  15# limit per MD for strengthening.  Vaso if desired.  Farley Ly, PT, MPT 10/09/2021, 4:16 PM

## 2021-10-11 DIAGNOSIS — I10 Essential (primary) hypertension: Secondary | ICD-10-CM | POA: Diagnosis not present

## 2021-10-11 DIAGNOSIS — J0100 Acute maxillary sinusitis, unspecified: Secondary | ICD-10-CM | POA: Diagnosis not present

## 2021-10-14 ENCOUNTER — Other Ambulatory Visit: Payer: Self-pay

## 2021-10-14 ENCOUNTER — Encounter: Payer: Self-pay | Admitting: Physical Therapy

## 2021-10-14 ENCOUNTER — Ambulatory Visit (INDEPENDENT_AMBULATORY_CARE_PROVIDER_SITE_OTHER): Payer: Medicare HMO | Admitting: Physical Therapy

## 2021-10-14 ENCOUNTER — Inpatient Hospital Stay: Payer: Medicare HMO | Admitting: Internal Medicine

## 2021-10-14 VITALS — BP 153/85 | HR 60 | Temp 97.8°F | Resp 16 | Ht 65.0 in | Wt 205.0 lb

## 2021-10-14 DIAGNOSIS — C9030 Solitary plasmacytoma not having achieved remission: Secondary | ICD-10-CM | POA: Diagnosis not present

## 2021-10-14 DIAGNOSIS — M25511 Pain in right shoulder: Secondary | ICD-10-CM

## 2021-10-14 DIAGNOSIS — R600 Localized edema: Secondary | ICD-10-CM | POA: Diagnosis not present

## 2021-10-14 DIAGNOSIS — Z923 Personal history of irradiation: Secondary | ICD-10-CM | POA: Diagnosis not present

## 2021-10-14 DIAGNOSIS — M6281 Muscle weakness (generalized): Secondary | ICD-10-CM | POA: Diagnosis not present

## 2021-10-14 NOTE — Progress Notes (Signed)
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  PRIOR THERAPY:  1) Palliative radiotherapy to the plasmacytoma of the proximal right humerus under the care of Dr. Sondra Come. 2) Status post tumor resection from the proximal humerus with reconstruction under the care of Dr. Mylo Red at [facility] on May 01, 2021.  CURRENT THERAPY:   INTERVAL HISTORY: Joanna Ray 71 y.o. female returns to the clinic today for follow-up visit accompanied by her Sister Pamala Hurry.  The patient is feeling fine today with no concerning complaints.  She denied having any chest pain, shortness of breath, cough or hemoptysis.  She has more movement in the right arm after her tumor resection from the proximal humerus.  She denied having any recent weight loss or night sweats.  She has no nausea, vomiting, diarrhea or constipation.  She has no headache or visual changes.  She is here today for evaluation with repeat myeloma panel.  MEDICAL HISTORY: Past Medical History:  Diagnosis Date   Anxiety    GERD (gastroesophageal reflux disease)    Hyperlipidemia    Hypertension    Urticaria     ALLERGIES:  is allergic to shellfish allergy.  MEDICATIONS:  Current Outpatient Medications  Medication Sig Dispense Refill   ALPRAZolam (XANAX) 0.5 MG tablet Take 1 tablet by mouth twice daily as needed for anxiety 60 tablet 5   aspirin 81 MG tablet Take 81 mg by mouth daily.     cholecalciferol (VITAMIN D) 1000 UNITS tablet Take 1,000 Units by mouth daily.     EPINEPHrine 0.3 mg/0.3 mL IJ SOAJ injection Inject 0.3 mLs (0.3 mg total) into the muscle as needed for anaphylaxis. 1 each 5   escitalopram (LEXAPRO) 10 MG tablet 1 tab(s)     fexofenadine (ALLEGRA) 180 MG tablet  Take 1 tablet (180 mg total) by mouth daily. 90 tablet 1   hydrocortisone valerate cream (WESTCORT) 0.2 % 1 app     oxyCODONE-acetaminophen (PERCOCET/ROXICET) 5-325 MG tablet Take 1 tablet by mouth every 8 (eight) hours as needed for severe pain. (Patient not taking: Reported on 03/26/2021) 10 tablet 0   rosuvastatin (CRESTOR) 20 MG tablet Take 1/2 (one-half) tablet by mouth once daily 45 tablet prn   traMADol (ULTRAM) 50 MG tablet Take 1 tablet (50 mg total) by mouth every 6 (six) hours as needed. 30 tablet 0   valsartan-hydrochlorothiazide (DIOVAN-HCT) 160-25 MG tablet Take 1 tablet by mouth daily. 90 tablet 1   vitamin E 100 UNIT capsule Take 100 Units by mouth daily.     No current facility-administered medications for this visit.    SURGICAL HISTORY:  Past Surgical History:  Procedure Laterality Date   CATARACT EXTRACTION, BILATERAL Bilateral    DILATION AND CURETTAGE OF UTERUS      REVIEW OF SYSTEMS:  A comprehensive review of systems was negative except for: Musculoskeletal: positive for muscle weakness   PHYSICAL EXAMINATION: General appearance: alert, cooperative, and no distress Head: Normocephalic, without obvious abnormality, atraumatic Neck: no adenopathy, no JVD, supple, symmetrical, trachea midline, and thyroid not enlarged, symmetric, no tenderness/mass/nodules Lymph nodes: Cervical, supraclavicular, and axillary nodes normal. Resp: clear to auscultation bilaterally Back: symmetric, no curvature. ROM normal. No CVA tenderness. Cardio: regular rate and rhythm, S1, S2 normal, no murmur, click, rub or  gallop GI: soft, non-tender; bowel sounds normal; no masses,  no organomegaly Extremities: No edema  ECOG PERFORMANCE STATUS: 1 - Symptomatic but completely ambulatory  Blood pressure (!) 153/85, pulse 60, temperature 97.8 F (36.6 C), temperature source Oral, resp. rate 16, height _0  (1.651 m), weight 205 lb (93 kg), SpO2 100 %.  LABORATORY DATA: Lab Results   Component Value Date   WBC 4.9 10/07/2021   HGB 12.2 10/07/2021   HCT 36.7 10/07/2021   MCV 91.8 10/07/2021   PLT 182 10/07/2021      Chemistry      Component Value Date/Time   NA 140 10/07/2021 1515   NA 145 (H) 12/28/2018 1436   K 3.3 (L) 10/07/2021 1515   CL 103 10/07/2021 1515   CO2 28 10/07/2021 1515   BUN 19 10/07/2021 1515   BUN 15 12/28/2018 1436   CREATININE 1.18 (H) 10/07/2021 1515   CREATININE 1.08 (H) 03/07/2021 0919      Component Value Date/Time   CALCIUM 9.0 10/07/2021 1515   ALKPHOS 40 10/07/2021 1515   AST 23 10/07/2021 1515   ALT 15 10/07/2021 1515   BILITOT 1.0 10/07/2021 1515       RADIOGRAPHIC STUDIES: No results found.  ASSESSMENT AND PLAN: This is a very pleasant 71 years old African-American female with plasmacytoma of the proximal right humerus diagnosed in February 2022.  The patient had extensive work-up for multiple myeloma including a bone marrow biopsy and aspirate that showed only 6% plasma cells.  Her protein studies still suspicious for underlying MGUS/multiple myeloma but the findings are not enough to call it multiple myeloma at this point. The patient underwent radiotherapy to the plasmacytoma of the proximal right humerus and tolerated the procedure fairly well.  She is currently on observation.  The skeletal bone survey showed no other lytic bone lesion except the proximal right humerus. The patient was treated with palliative radiotherapy in the past but recent skeletal bone survey showed progression of the lytic lesion in the proximal humeral diaphysis with pathologic fracture.  She underwent surgical resection of the tumor with reconstruction under the care of Dr. Mylo Red at [facility] on May 01, 2021.   The patient is currently on observation and she is feeling fine. She had repeat myeloma panel performed recently.  I discussed the lab result with the patient and her sister.  She continues to have further increase in the  IgG level as well as the free kappa light chain. I recommended for the patient to continue on observation with repeat myeloma panel in around 3 months and if she continues to have further increase in her protein study, I may consider repeating a bone marrow biopsy and aspirate as well as a skeletal bone survey. The patient was advised to call immediately if she has any other concerning issues in the interval. The patient voices understanding of current disease status and treatment options and is in agreement with the current care plan.  All questions were answered. The patient knows to call the clinic with any problems, questions or concerns. We can certainly see the patient much sooner if necessary.   Disclaimer: This note was dictated with voice recognition software. Similar sounding words can inadvertently be transcribed and may not be corrected upon review.

## 2021-10-14 NOTE — Therapy (Signed)
OUTPATIENT PHYSICAL THERAPY TREATMENT NOTE   Patient Name: Joanna Ray MRN: [id number] DOB:1951/04/17, 71 y.o., female Today's Date: 10/14/2021    END OF SESSION:   PT End of Session - 10/14/21 1021     Visit Number 16    Number of Visits 24    Date for PT Re-Evaluation 10/07/21    Authorization Type [employer] MCR    Authorization - Visit Number 16    Authorization - Number of Visits 36    Progress Note Due on Visit 25   last one done #15   PT Start Time 1015    PT Stop Time 1055    PT Time Calculation (min) 40 min    Activity Tolerance Patient tolerated treatment well;No increased pain    Behavior During Therapy WFL for tasks assessed/performed             Past Medical History:  Diagnosis Date   Anxiety    GERD (gastroesophageal reflux disease)    Hyperlipidemia    Hypertension    Urticaria    Past Surgical History:  Procedure Laterality Date   CATARACT EXTRACTION, BILATERAL Bilateral    DILATION AND CURETTAGE OF UTERUS     Patient Active Problem List   Diagnosis Date Noted   Right shoulder pain 08/26/2020   Plasmacytoma ([place]) 06/28/2020   Angioedema 01/21/2019   Vertigo 09/28/2016   Hyperlipidemia 09/25/2011   Hypertension     THERAPY DIAG:  Muscle weakness (generalized)  Right shoulder pain, unspecified chronicity  Acute pain of right shoulder  Localized edema   PCP: [name], MD   REFERRING PROVIDER: Janice Coffin, MD   REFERRING DIAG: [phone] (ICD-10-CM) - 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 Yes: NPRS scale: 0-5/10 this week Pain location: Rt shoulder Pain description: Sore with reaching, unable to function overhead Aggravating factors: Getting dressed, R shoulder use, particularly reaching and overhead Relieving factors: Rest and exercises, pain medication   PRECAUTIONS: S/P right proximal humerus replacement 05/01/21, start with PROM 0-90 forward flexion up to 120 by 6 weeks. Abd to 120, ER to 40, IR to 60. Add AROM  to same limits. Strengthening to add at 3-4 weeks max lifting 15#"   WEIGHT BEARING RESTRICTIONS None listed however limit weight bearing to Rt UE at this time due to post op status  OCCUPATION: Math and Reading Teacher to adults   PLOF: Independent   PATIENT GOALS : Be able to comb and fix her hair and be able to raise her arm again   OBJECTIVE:    DIAGNOSTIC FINDINGS:  XR in care everywhere section   PATIENT SURVEYS:  FOTO 45% funcitonal, goal is 48%    UPPER EXTREMITY ROM:     AROM/PROM Right supine 07/15/2021 Right  sitting 08/07/21 Right Standing 08/21/21 Right  Supine Passive 08/26/21 Right Supine Passive 08/28/2021 Right Active in standing 09/10/21 R passive supine 10/03/2021  Shoulder flexion 0/50  10/90 60 140 135 60 120 degrees  Shoulder extension 40/         Shoulder abduction 30/70  50/85 80 110  80 with elbow in 90 deg of flexiona   Shoulder horizontal adduction adduction         35 degrees  Shoulder internal rotation 50/60 at 45 deg abd    75 Shoulder abd 45 degrees 70 degrees at 70 degrees abduction  110 degrees   Shoulder external rotation 10/25 at 45 deg abd  30/ 55/ 70  Shoulder abd to 45 degrees  60 degrees at 70 degrees abduction   100 degrees  (Blank rows = not tested)   UPPER EXTREMITY MMT:   MMT Right 07/15/2021  Right 08/12/21 L/R in pounds 08/28/2021 R in pounds 10/09/2021  Shoulder flexion 2  2    Shoulder extension 3  3+    Shoulder  abduction 2  2    Shoulder adduction        Shoulder internal rotation 3  3+ 40.1/5.3 10 pounds  Shoulder external rotation 2  2+ 25.1/<3 (0.0) < 3 pounds  Middle trapezius        Lower trapezius        Elbow flexion        Elbow extension        Grip strength (lbs)        (Blank rows = not tested)     PALPATION:  07/15/21: TTP Rt anterior shoulder  TODAY'S TREATMENT 10/14/21 -UBE L5 3 min fwd, 3 min reverse -Pulleys 2 min flexion, 2 min abd -Supine Rt arm raise into protraction then flexion and slow eccentric on the way down 10 reps then 5 reps -Sidelying Rt shoulder ER 2X 10 -shoulder abduction bilat AROM short lever (elbows bent) 2X10 -Sitting shoulder flexion AAROM with hands clasped and slow eccentric lower -Standing rows blue X20 -Standing  IR yellow X 15 holding 3 sec -Standing yellow band ER in her limited ROM X 10 -Standing yellow band ER step outs with isometric hold 3 sec concentric/eccentric 10X  10/09/2021: Supine arm raises 20 with 2# weight & 10X 0# full range Side lie ER 2 sets of 10 0# Pulley flexion and scation with decompress (palm in, reach out 1st) 10X 10 seconds each Scapular retraction 5X 5 seconds UBE 8 minutes (:20 intervals, push/pull/rest) Level 5 Standing Level 2 band ER step outs with isometric hold 10 & limited range concentric/eccentric 10X IR Theraband Yellow 10X 3 seconds limited range concentric/eccentric Standing Rows/Pull to chest: Blue band x 20 B Standing UE ranger 8 inch from bottom for flexion X10 and circles CW,CCW X 10  Functional Activities: Eccentric control shoulder flexion with PT 3X  PATIENT EDUCATION: 08/28/21 Education details: Reviewed reassessment findings and updated HEP with strength emphasis Person educated: Patient Education method: Explanation, Demonstration, Verbal cues, and Handouts Education comprehension: verbalized understanding, returned demonstration, and needs further education     HOME EXERCISE PROGRAM:   Access Code: [email or web address] URL: [email or web address] Date: 08/28/2021 Prepared by: Vista Mink  Exercises - Seated Shoulder Flexion Towel Slide at Table Top  - 2 x daily - 6 x weekly - 1-2 sets - 10 reps - Seated Shoulder External Rotation PROM on Table  - 2 x daily - 6 x weekly - 1-2 sets - 10 reps - 5 hold - Seated Shoulder Abduction Towel Slide at Table Top  - 2 x daily - 6 x weekly - 1-2 sets - 10 reps - 5 hold - Isometric Shoulder Flexion  - 2 x daily - 6 x weekly - 1-2 sets - 10 reps - 5 hold - Isometric Shoulder External Rotation  - 2 x daily - 6 x weekly - 1-2 sets - 10 reps - 5 hold - Isometric Shoulder Internal Rotation  - 2 x daily - 6 x weekly - 1-2 sets - 10 reps - 5 hold - Isometric Shoulder Abduction at Wall  - 2 x daily - 6 x weekly - 1-2 sets - 10 reps - 5 hold - Seated Scapular Retraction  - 2 x daily - 6 x weekly - 2 sets - 10 reps - 5 sec hold - Seated Shoulder External Rotation AAROM with Dowel  - 2 x daily - 6 x weekly - 1-2 sets - 10 reps - Seated Shoulder Abduction AAROM with Dowel  - 2 x daily - 6 x weekly - 1-2 sets - 10 reps - Seated Shoulder Flexion AAROM with Dowel  - 2 x daily - 6 x weekly - 1-2 sets - 10 reps - Supine Scapular Protraction in Flexion with Dumbbells  - 2-3 x daily - 7 x weekly - 1 sets - 20 reps - 3 seconds hold   ASSESSMENT:   CLINICAL IMPRESSION: We continued to work to improve her overall shoulder strength with good overall exercise tolerance. Her ROM is now doing well but she continues to lack strength needed to functionally raise her Rt arm.    OBJECTIVE IMPAIRMENTS: decreased activity tolerance, decreased endurance, decreased mobility, decreased ROM, decreased strength, impaired flexibility, impaired LE use, postural dysfunction, and pain.   ACTIVITY LIMITATIONS:  lifting, carry,  cleaning, dressing, bathing, driving, and or occupation   REHAB POTENTIAL: Good   CLINICAL DECISION MAKING: stable/uncomplicated   EVALUATION  COMPLEXITY: Low     GOALS: Short term PT Goals Target date: 08/12/2021 Pt will be I and compliant with HEP. Baseline:  Goal status: MET Pt will improve Rt shoulder abduction and flexion PROM >90 deg Baseline: Goal status: MET   Long term PT goals Target date: 10/07/2021 Pt will improve Rt shoulder ROM to [facility] to improve functional mobility Baseline: Goal status: On going 10/09/2021       2.    Pt will improve  Rt shoulder strength to at least 4/5 MMT to improve functional strength Baseline: Goal status: On going 10/09/2021 Pt will improve FOTO to at least 48% functional to show improved function Baseline: 45 Goal status: Met 10/09/2021 Pt will reduce pain to overall less than 2-3/10 with usual activity and work activity. Baseline: 6/10 (On Going 6/152023) Goal status: On Going 10/09/2021 Pt will be able to dress herself and fix her hair without more than mild complaints  in Rt UE.                  Goal status: On going 10/09/2021  PLAN: PT FREQUENCY: 2 times per week    PT DURATION: 4 weeks   PLANNED INTERVENTIONS (unless contraindicated): aquatic PT, Canalith repositioning, cryotherapy, Electrical stimulation, Iontophoresis with 4 mg/ml dexamethasome, Moist heat, traction, Ultrasound, gait training, Therapeutic exercise, balance training, neuromuscular re-education, patient/family education, prosthetic training, manual techniques, passive ROM, dry needling, taping, vasopnuematic device, vestibular, spinal manipulations, joint manipulations   PLAN FOR NEXT SESSION: Continue to progress scapular, RTC and general R UE strength as able.  Progressions into reaching and overhead function.  15# limit per MD for strengthening.   Debbe Odea, PT, DPT 10/14/2021, 10:55 AM

## 2021-10-21 ENCOUNTER — Encounter: Payer: Self-pay | Admitting: Physical Therapy

## 2021-10-21 ENCOUNTER — Ambulatory Visit: Payer: Medicare HMO | Admitting: Physical Therapy

## 2021-10-21 DIAGNOSIS — R600 Localized edema: Secondary | ICD-10-CM | POA: Diagnosis not present

## 2021-10-21 DIAGNOSIS — M25511 Pain in right shoulder: Secondary | ICD-10-CM

## 2021-10-21 DIAGNOSIS — M6281 Muscle weakness (generalized): Secondary | ICD-10-CM | POA: Diagnosis not present

## 2021-10-21 NOTE — Therapy (Signed)
OUTPATIENT PHYSICAL THERAPY TREATMENT NOTE   Patient Name: Joanna Ray MRN: [id number] DOB:1950-08-29, 71 y.o., female Today's Date: 10/21/2021    END OF SESSION:   PT End of Session - 10/21/21 1352     Visit Number 17    Number of Visits 24    Date for PT Re-Evaluation 10/07/21    Authorization Type [employer] M[employer]    Authorization - Visit Number 17    Authorization - Number of Visits 36    Progress Note Due on Visit 25   last one done #15   PT Start Time 1345    PT Stop Time 1430    PT Time Calculation (min) 45 min    Activity Tolerance Patient tolerated treatment well;No increased pain    Behavior During Therapy WFL for tasks assessed/performed             Past Medical History:  Diagnosis Date   Anxiety    GERD (gastroesophageal reflux disease)    Hyperlipidemia    Hypertension    Urticaria    Past Surgical History:  Procedure Laterality Date   CATARACT EXTRACTION, BILATERAL Bilateral    DILATION AND CURETTAGE OF UTERUS     Patient Active Problem List   Diagnosis Date Noted   Right shoulder pain 08/26/2020   Plasmacytoma ([facility]) 06/28/2020   Angioedema 01/21/2019   Vertigo 09/28/2016   Hyperlipidemia 09/25/2011   Hypertension     THERAPY DIAG:  Muscle weakness (generalized)  Right shoulder pain, unspecified chronicity  Acute pain of right shoulder  Localized edema   PCP: [name], MD   REFERRING PROVIDER: Toy Baker, MD   REFERRING DIAG: [id number] (ICD-10-CM) - 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     PAIN:  Are you having pain? Yes: NPRS scale: 0 upon arrival.  Pain location: Rt shoulder Pain description: Sore with reaching, unable to function overhead Aggravating factors: Getting dressed, R shoulder use, particularly reaching and overhead Relieving factors: Rest and exercises, pain medication   PRECAUTIONS: S/P right proximal humerus replacement 05/01/21, start with PROM 0-90 forward flexion up to 120 by 6  weeks. Abd to 120, ER to 40, IR to 60. Add AROM to same limits. Strengthening to add at 3-4 weeks max lifting 15#"   WEIGHT BEARING RESTRICTIONS None listed however limit weight bearing to Rt UE at this time due to post op status  OCCUPATION: Math and Reading Teacher to adults   PLOF: Independent   PATIENT GOALS : Be able to comb and fix her hair and be able to raise her arm again   OBJECTIVE:    DIAGNOSTIC FINDINGS:  XR in care everywhere section   PATIENT SURVEYS:  FOTO 45% funcitonal, goal is 48%    UPPER EXTREMITY ROM:     AROM/PROM Right supine 07/15/2021 Right  sitting 08/07/21 Right Standing 08/21/21 Right  Supine Passive 08/26/21 Right Supine Passive 08/28/2021 Right Active in standing 09/10/21 R passive supine 10/03/2021  Shoulder flexion 0/50  10/90 60 140 135 60 120 degrees  Shoulder extension 40/         Shoulder abduction 30/70  50/85 80 110  80 with elbow in 90 deg of flexiona   Shoulder horizontal adduction adduction         35 degrees  Shoulder internal rotation 50/60 at 45 deg abd    75 Shoulder abd 45 degrees 70 degrees at 70 degrees abduction  110 degrees   Shoulder external rotation 10/25 at 45 deg abd  30/ 55/ 70  Shoulder abd to 45 degrees  60 degrees at 70 degrees abduction   100 degrees  (Blank rows = not tested)   UPPER EXTREMITY MMT:   MMT Right 07/15/2021  Right 08/12/21 L/R in pounds 08/28/2021 R in pounds 10/09/2021  Shoulder  flexion 2  2    Shoulder extension 3  3+    Shoulder abduction 2  2    Shoulder adduction        Shoulder internal rotation 3  3+ 40.1/5.3 10 pounds  Shoulder external rotation 2  2+ 25.1/<3 (0.0) < 3 pounds  Middle trapezius        Lower trapezius        Elbow flexion        Elbow extension        Grip strength (lbs)        (Blank rows = not tested)     PALPATION:  07/15/21: TTP Rt anterior shoulder  TODAY'S TREATMENT 10/21/21 -UBE L4 2.5 min fwd,2.5 min reverse -Standing red pball rolls on high mat table rolling up wedge incline 2X10 flexion and scaption for Rt shoulder -Standing AAROM shoulder flexion with2# bar X 5 then 1# bar X10 -Supine Rt arm raise chest press  4 sets of 10 on Rt -Supine ER AROM 3X10 bilat -Sidelying Rt shoulder ER 2X 10  -Sidelying Rt shoulder flexion AROM 2X10 -Sidelying Rt shoulder abduction AROM short lever (elbows bent) 2X10  Vasopnuematic device X 10 min to left shoulder, medium compression, lowest temp.   10/14/21 -UBE L5 3 min fwd, 3 min reverse -Pulleys 2 min flexion, 2 min abd -Supine Rt arm raise into protraction then flexion and slow eccentric on the way down 10 reps then 5 reps -Sidelying Rt shoulder ER 2X 10 -shoulder abduction bilat AROM short lever (elbows bent) 2X10 -Sitting shoulder flexion AAROM with hands clasped and slow eccentric lower -Standing rows blue X20 -Standing  IR yellow X 15 holding 3 sec -Standing yellow band ER in her limited ROM X 10 -Standing yellow band ER step outs with isometric hold 3 sec concentric/eccentric 10X  10/09/2021:  Supine arm raises 20 with 2# weight & 10X 0# full range Side lie ER 2 sets of 10 0# Pulley flexion and scation with decompress (palm in, reach out 1st) 10X 10 seconds each Scapular retraction 5X 5 seconds UBE 8 minutes (:20 intervals, push/pull/rest) Level 5 Standing Level 2 band ER step outs with isometric hold 10 & limited range concentric/eccentric 10X IR Theraband Yellow 10X 3  seconds limited range concentric/eccentric Standing Rows/Pull to chest: Blue band x 20 B Standing UE ranger 8 inch from bottom for flexion X10 and circles CW,CCW X 10  Functional Activities: Eccentric control shoulder flexion with PT 3X  PATIENT EDUCATION: 08/28/21 Education details: Reviewed reassessment findings and updated HEP with strength emphasis Person educated: Patient Education method: Explanation, Demonstration, Verbal cues, and Handouts Education comprehension: verbalized understanding, returned demonstration, and needs further education     HOME EXERCISE PROGRAM: Access Code: [email or web address] URL: [email or web address] Date: 10/21/2021 Prepared by: Ivery Quale  Exercises - Standing Shoulder Flexion AAROM with Dowel  - 2 x daily - 6 x weekly - 2-3 sets - 10 reps - Supine Bilateral Shoulder External Rotation with Resistance around Wrists  - 2 x daily - 6 x weekly - 2-3 sets - 10 reps - Supine Shoulder Press  - 2 x daily - 6 x weekly - 2-3 sets - 10 reps - Sidelying Shoulder Flexion 15 Degrees  - 2 x daily - 6 x weekly - 2-3 sets - 10 reps - Sidelying Shoulder External Rotation  - 2 x daily - 6 x weekly - 2-3 sets - 10 reps - Sidelying Shoulder Horizontal Abduction  - 2 x daily - 6 x weekly - 2-3 sets - 10 reps   ASSESSMENT:   CLINICAL IMPRESSION: Her PROM is doing well but lacks AROM due to weakness. We focused more on AROM today. She does need compensations from delts and shrugs however this also allows her to be more functional with her arm so as long as she does not have pain with this I think we need to allow her to compensate some.    OBJECTIVE IMPAIRMENTS: decreased activity tolerance, decreased endurance, decreased mobility, decreased ROM, decreased strength, impaired flexibility, impaired LE use, postural dysfunction, and pain.   ACTIVITY LIMITATIONS:  lifting, carry,  cleaning, dressing, bathing, driving, and or occupation   REHAB POTENTIAL: Good    CLINICAL DECISION MAKING: stable/uncomplicated   EVALUATION COMPLEXITY: Low     GOALS: Short term PT Goals Target date: 08/12/2021 Pt will be I and compliant with HEP. Baseline:  Goal status: MET Pt will improve Rt shoulder abduction and flexion PROM >90 deg Baseline: Goal status: MET   Long term PT goals Target date: 10/07/2021 Pt will improve Rt shoulder ROM to [facility] to improve functional mobility Baseline: Goal status: On going 10/09/2021       2.    Pt will improve  Rt shoulder strength to at least 4/5 MMT to improve functional strength Baseline: Goal status: On going 10/09/2021 Pt will improve FOTO to at least 48% functional to show improved function Baseline: 45 Goal status: Met 10/09/2021 Pt will reduce pain to overall less than 2-3/10 with usual activity and work activity. Baseline: 6/10 (On Going 6/152023) Goal status: On Going 10/09/2021 Pt will be able to dress herself and fix her hair without more than mild complaints in Rt UE.                  Goal status: On going 10/09/2021  PLAN: PT  FREQUENCY: 2 times per week    PT DURATION: 4 weeks   PLANNED INTERVENTIONS (unless contraindicated): aquatic PT, Canalith repositioning, cryotherapy, Electrical stimulation, Iontophoresis with 4 mg/ml dexamethasome, Moist heat, traction, Ultrasound, gait training, Therapeutic exercise, balance training, neuromuscular re-education, patient/family education, prosthetic training, manual techniques, passive ROM, dry needling, taping, vasopnuematic device, vestibular, spinal manipulations, joint manipulations   PLAN FOR NEXT SESSION: Continue to progress scapular, RTC and general R UE strength as able.  Progressions into reaching and overhead function.  15# limit per MD for strengthening.   April Manson, PT, DPT 10/14/2021, 10:55 AM

## 2021-10-23 ENCOUNTER — Ambulatory Visit: Payer: Medicare HMO | Admitting: Physical Therapy

## 2021-10-23 ENCOUNTER — Encounter: Payer: Self-pay | Admitting: Physical Therapy

## 2021-10-23 DIAGNOSIS — R600 Localized edema: Secondary | ICD-10-CM | POA: Diagnosis not present

## 2021-10-23 DIAGNOSIS — M25511 Pain in right shoulder: Secondary | ICD-10-CM | POA: Diagnosis not present

## 2021-10-23 DIAGNOSIS — M6281 Muscle weakness (generalized): Secondary | ICD-10-CM | POA: Diagnosis not present

## 2021-10-23 NOTE — Therapy (Signed)
OUTPATIENT PHYSICAL THERAPY TREATMENT NOTE   Patient Name: Joanna Ray MRN: [id number] DOB:03/10/1951, 71 y.o., female Today's Date: 10/23/2021    END OF SESSION:   PT End of Session - 10/23/21 0919     Visit Number 18    Number of Visits 24    Date for PT Re-Evaluation 10/07/21    Authorization Type [employer] MCR    Authorization - Visit Number 18    Authorization - Number of Visits 36    Progress Note Due on Visit 25   last one done #15   PT Start Time 0850    PT Stop Time 0935    PT Time Calculation (min) 45 min    Activity Tolerance Patient tolerated treatment well;No increased pain    Behavior During Therapy WFL for tasks assessed/performed             Past Medical History:  Diagnosis Date   Anxiety    GERD (gastroesophageal reflux disease)    Hyperlipidemia    Hypertension    Urticaria    Past Surgical History:  Procedure Laterality Date   CATARACT EXTRACTION, BILATERAL Bilateral    DILATION AND CURETTAGE OF UTERUS     Patient Active Problem List   Diagnosis Date Noted   Right shoulder pain 08/26/2020   Plasmacytoma ([place]) 06/28/2020   Angioedema 01/21/2019   Vertigo 09/28/2016   Hyperlipidemia 09/25/2011   Hypertension     THERAPY DIAG:  Muscle weakness (generalized)  Right shoulder pain, unspecified chronicity  Acute pain of right shoulder  Localized edema   PCP: [name], MD   REFERRING PROVIDER: Janice Coffin, MD   REFERRING DIAG: [phone] (ICD-10-CM) - 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     PAIN:  Are you having pain? Yes: NPRS scale: 0 upon arrival.  Pain location: Rt shoulder Pain description: Sore with reaching, unable to function overhead Aggravating factors: Getting dressed, R shoulder use, particularly reaching and overhead Relieving factors: Rest and exercises, pain medication   PRECAUTIONS: S/P right proximal humerus replacement 05/01/21, start with PROM 0-90 forward flexion up to 120 by 6  weeks. Abd to 120, ER to 40, IR to 60. Add AROM to same limits. Strengthening to add at 3-4 weeks max lifting 15#"   WEIGHT BEARING RESTRICTIONS None listed however limit weight bearing to Rt UE at this time due to post op status  OCCUPATION: Math and Reading Teacher to adults   PLOF: Independent   PATIENT GOALS : Be able to comb and fix her hair and be able to raise her arm again   OBJECTIVE:    DIAGNOSTIC FINDINGS:  XR in care everywhere section   PATIENT SURVEYS:  FOTO 45% funcitonal, goal is 48%    UPPER EXTREMITY ROM:     AROM/PROM Right supine 07/15/2021 Right  sitting 08/07/21 Right Standing 08/21/21 Right  Supine Passive 08/26/21 Right Supine Passive 08/28/2021 Right Active in standing 09/10/21 R passive supine 10/03/2021  Shoulder flexion 0/50  10/90 60 140 135 60 120 degrees  Shoulder extension 40/         Shoulder abduction 30/70  50/85 80 110  80 with elbow in 90 deg of flexiona   Shoulder horizontal adduction adduction         35 degrees  Shoulder internal rotation 50/60 at 45 deg abd    75 Shoulder abd 45 degrees 70 degrees at 70 degrees abduction  110 degrees   Shoulder external rotation 10/25 at 45 deg abd  30/ 55/ 70  Shoulder abd to 45 degrees  60 degrees at 70 degrees abduction   100 degrees  (Blank rows = not tested)   UPPER EXTREMITY MMT:   MMT Right 07/15/2021  Right 08/12/21 L/R in pounds 08/28/2021 R in pounds 10/09/2021  Shoulder  flexion 2  2    Shoulder extension 3  3+    Shoulder abduction 2  2    Shoulder adduction        Shoulder internal rotation 3  3+ 40.1/5.3 10 pounds  Shoulder external rotation 2  2+ 25.1/<3 (0.0) < 3 pounds  Middle trapezius        Lower trapezius        Elbow flexion        Elbow extension        Grip strength (lbs)        (Blank rows = not tested)     PALPATION:  07/15/21: TTP Rt anterior shoulder  TODAY'S TREATMENT 10/23/21 -UBE L4 2.5 min fwd,2.5 min reverse -Pulleys 2 min flexion, 2 min scaption -Standing AAROM shoulder flexion with2# bar X 5 then 1# bar sets of 10, 5, against wall to minimize lumbar extension -Standing AAROM shoulder abduction eccentrics 2X10 -Supine Rt arm raise chest press  3 sets of 10  -Sidelying Rt shoulder ER 2X 10 holding 3 sec  -Sidelying Rt shoulder flexion AROM 2X10 -Sidelying Rt shoulder abduction AROM short lever (elbows bent) 2X10  Vasopnuematic device X 10 min to left shoulder, medium compression, lowest temp.  10/21/21 -UBE L4 2.5 min fwd,2.5 min reverse -Standing red pball rolls on high mat table rolling up wedge incline 2X10 flexion and scaption for Rt shoulder -Standing AAROM shoulder flexion with2# bar X 5 then 1# bar X10 -Supine Rt arm raise chest press  4 sets of 10 on Rt -Supine ER AROM 3X10 bilat -Sidelying Rt shoulder ER 2X 10  -Sidelying Rt shoulder flexion AROM 2X10 -Sidelying Rt shoulder abduction AROM short lever (elbows bent) 2X10  Vasopnuematic device X 10 min to left shoulder, medium compression, lowest temp.   PATIENT EDUCATION: 08/28/21 Education details: Reviewed  reassessment findings and updated HEP with strength emphasis Person educated: Patient Education method: Explanation, Demonstration, Verbal cues, and Handouts Education comprehension: verbalized understanding, returned demonstration, and needs further education     HOME EXERCISE PROGRAM: Access Code: [email or web address] URL:  [email or web address] Date: 10/21/2021 Prepared by: Joanna Ray  Exercises - Standing Shoulder Flexion AAROM with Dowel  - 2 x daily - 6 x weekly - 2-3 sets - 10 reps - Supine Bilateral Shoulder External Rotation with Resistance around Wrists  - 2 x daily - 6 x weekly - 2-3 sets - 10 reps - Supine Shoulder Press  - 2 x daily - 6 x weekly - 2-3 sets - 10 reps - Sidelying Shoulder Flexion 15 Degrees  - 2 x daily - 6 x weekly - 2-3 sets - 10 reps - Sidelying Shoulder External Rotation  - 2 x daily - 6 x weekly - 2-3 sets - 10 reps - Sidelying Shoulder Horizontal Abduction  - 2 x daily - 6 x weekly - 2-3 sets - 10 reps   ASSESSMENT:   CLINICAL IMPRESSION: We continued to work to improve her strength with some compensations allowed for her to be more functional with her Rt arm. She had good overall tolerance to program today.    OBJECTIVE IMPAIRMENTS: decreased activity tolerance, decreased endurance, decreased mobility, decreased ROM, decreased strength, impaired flexibility, impaired LE use, postural dysfunction, and pain.   ACTIVITY LIMITATIONS:  lifting, carry,  cleaning, dressing, bathing, driving, and or occupation   REHAB POTENTIAL: Good   CLINICAL DECISION MAKING: stable/uncomplicated   EVALUATION COMPLEXITY: Low     GOALS: Short term PT Goals Target date: 08/12/2021 Pt will be I and compliant with HEP. Baseline:  Goal status: MET Pt will improve Rt shoulder abduction and flexion PROM >90 deg Baseline: Goal status: MET   Long term PT goals Target date: 10/07/2021 Pt will improve Rt shoulder ROM to [facility] to improve functional mobility Baseline: Goal status: On going 10/09/2021       2.    Pt will improve  Rt shoulder strength to at least 4/5 MMT to improve functional strength Baseline: Goal status: On going 10/09/2021 Pt will improve FOTO to at least 48% functional to show improved function Baseline: 45 Goal status: Met 10/09/2021 Pt will reduce pain to  overall less than 2-3/10 with usual activity and work activity. Baseline: 6/10 (On Going 6/152023) Goal status: On Going 10/09/2021 Pt will be able to dress herself and fix her hair without more than mild complaints in Rt UE.                  Goal status: On going 10/09/2021  PLAN: PT FREQUENCY: 2 times per week    PT DURATION: 4 weeks   PLANNED INTERVENTIONS (unless contraindicated): aquatic PT, Canalith repositioning, cryotherapy, Electrical stimulation, Iontophoresis with 4 mg/ml dexamethasome, Moist heat, traction, Ultrasound, gait training, Therapeutic exercise, balance training, neuromuscular re-education, patient/family education, prosthetic training, manual techniques, passive ROM, dry needling, taping, vasopnuematic device, vestibular, spinal manipulations, joint manipulations   PLAN FOR NEXT SESSION: Continue to progress scapular, RTC and general R UE strength as able.  Progressions into reaching and overhead function.  15# limit per MD for strengthening.   Debbe Odea, PT, DPT 10/14/2021, 10:55 AM

## 2021-11-04 ENCOUNTER — Ambulatory Visit (INDEPENDENT_AMBULATORY_CARE_PROVIDER_SITE_OTHER): Payer: Medicare HMO | Admitting: Physical Therapy

## 2021-11-04 ENCOUNTER — Encounter: Payer: Self-pay | Admitting: Physical Therapy

## 2021-11-04 DIAGNOSIS — M6281 Muscle weakness (generalized): Secondary | ICD-10-CM | POA: Diagnosis not present

## 2021-11-04 DIAGNOSIS — M25511 Pain in right shoulder: Secondary | ICD-10-CM

## 2021-11-04 DIAGNOSIS — R600 Localized edema: Secondary | ICD-10-CM

## 2021-11-04 NOTE — Therapy (Signed)
OUTPATIENT PHYSICAL THERAPY TREATMENT NOTE   Patient Name: Joanna Ray MRN: [id number] DOB:Feb 20, 1951, 71 y.o., female Today's Date: 11/04/2021    END OF SESSION:   PT End of Session - 11/04/21 1322     Visit Number 19    Number of Visits 24    Date for PT Re-Evaluation 12/12/21    Authorization Type [employer] M[employer]    Authorization - Visit Number [id number]    Authorization - Number of Visits 36    Progress Note Due on Visit 25   last one done #15   PT Start Time 1314    PT Stop Time 1355    PT Time Calculation (min) 41 min    Activity Tolerance Patient tolerated treatment well;No increased pain    Behavior During Therapy WFL for tasks assessed/performed             Past Medical History:  Diagnosis Date   Anxiety    GERD (gastroesophageal reflux disease)    Hyperlipidemia    Hypertension    Urticaria    Past Surgical History:  Procedure Laterality Date   CATARACT EXTRACTION, BILATERAL Bilateral    DILATION AND CURETTAGE OF UTERUS     Patient Active Problem List   Diagnosis Date Noted   Right shoulder pain 08/26/2020   Plasmacytoma ([place]) 06/28/2020   Angioedema 01/21/2019   Vertigo 09/28/2016   Hyperlipidemia 09/25/2011   Hypertension     THERAPY DIAG:  Muscle weakness (generalized)  Right shoulder pain, unspecified chronicity  Acute pain of right shoulder  Localized edema   PCP: [name], MD   REFERRING PROVIDER: Janice Coffin, MD   REFERRING DIAG: [phone] (ICD-10-CM) - 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 She denies pain upon arrival.   PAIN:  Are you having pain? Yes: NPRS scale: 0 upon arrival.  Pain location: Rt shoulder Pain description: Sore with reaching, unable to function overhead Aggravating factors: Getting dressed, R shoulder use, particularly reaching and overhead Relieving factors: Rest and exercises, pain medication   PRECAUTIONS: S/P right proximal humerus replacement 05/01/21, start with PROM 0-90 forward flexion  up to 120 by 6 weeks. Abd to 120, ER to 40, IR to 60. Add AROM to same limits. Strengthening to add at 3-4 weeks max lifting 15#"   WEIGHT BEARING RESTRICTIONS None listed however limit weight bearing to Rt UE at this time due to post op status  OCCUPATION: Math and Reading Teacher to adults   PLOF: Independent   PATIENT GOALS : Be able to comb and fix her hair and be able to raise her arm again   OBJECTIVE:    DIAGNOSTIC FINDINGS:  XR in care everywhere section   PATIENT SURVEYS:  FOTO 45% funcitonal, goal is 48%    UPPER EXTREMITY ROM:     AROM/PROM Right supine 07/15/2021 Right  sitting 08/07/21 Right Standing 08/21/21 Right  Supine Passive 08/26/21 Right Supine Passive 08/28/2021 Right Active in standing 09/10/21 R passive supine 10/03/2021 Right active in standing 11/04/21  Shoulder flexion 0/50  10/90 60 140 135 60 120 degrees 90 AROM  Shoulder extension 40/          Shoulder abduction 30/70  50/85 80 110  80 with elbow in 90 deg of flexiona  80 AROM with arm extended  Shoulder horizontal adduction adduction         35 degrees   Shoulder internal rotation 50/60 at 45 deg abd    75 Shoulder abd 45 degrees 70 degrees at 70 degrees abduction  110 degrees    Shoulder external rotation 10/25 at 45 deg abd  30/ 55/ 70  Shoulder abd to 45 degrees  60 degrees at 70 degrees abduction   100 degrees 45 AROM  (Blank rows = not tested)    UPPER EXTREMITY MMT:   MMT Right 07/15/2021  Right 08/12/21 L/R in pounds 08/28/2021 R in pounds 10/09/2021  Shoulder flexion 2  2    Shoulder extension 3  3+    Shoulder abduction 2  2    Shoulder adduction        Shoulder internal rotation 3  3+ 40.1/5.3 10 pounds  Shoulder external rotation 2  2+ 25.1/<3 (0.0) < 3 pounds  Middle trapezius        Lower trapezius        Elbow flexion        Elbow extension        Grip strength (lbs)        (Blank rows = not tested)     PALPATION:  07/15/21: TTP Rt anterior shoulder  TODAY'S TREATMENT 11/04/21 -UBE L4 2.5 min fwd,2.5 min reverse -Pulleys 2 min flexion, 2 min scaption -Standing AAROM horizontal add-abd rolling red ball across tall mat table -Standing AAROM bilat shoulder flexion pball walk up wall X10 -Standing AAROM shoulder abduction eccentrics 2X10 (first set elbow extended, 2nd set shortor lever elbows bent) -Standing shoulder ER with yellow band 2X10  Vasopnuematic device X 10 min to left shoulder, medium compression, lowest temp.  10/23/21 -UBE L4 2.5 min fwd,2.5 min reverse -Pulleys 2 min flexion, 2 min scaption -Standing AAROM shoulder flexion with2# bar X 5 then 1# bar sets of 10, 5, against wall to minimize lumbar extension -Standing AAROM shoulder abduction eccentrics 2X10 -Supine Rt arm raise chest press  3 sets of 10  -Sidelying Rt shoulder ER 2X 10 holding 3 sec  -Sidelying Rt shoulder flexion AROM 2X10 -Sidelying Rt shoulder abduction AROM short lever (elbows bent) 2X10  Vasopnuematic device X 10 min to left shoulder, medium compression, lowest temp.  10/21/21 -UBE L4 2.5 min fwd,2.5  min reverse -Standing red pball rolls on high mat table rolling up wedge incline 2X10 flexion and scaption for Rt shoulder -Standing AAROM shoulder flexion with2# bar X 5 then 1# bar X10 -Supine Rt arm raise chest press  4 sets of 10 on Rt -Supine ER AROM 3X10 bilat -Sidelying Rt shoulder ER 2X 10  -Sidelying Rt shoulder flexion  AROM 2X10 -Sidelying Rt shoulder abduction AROM short lever (elbows bent) 2X10  Vasopnuematic device X 10 min to left shoulder, medium compression, lowest temp.   PATIENT EDUCATION: 08/28/21 Education details: Reviewed reassessment findings and updated HEP with strength emphasis Person educated: Patient Education method: Explanation, Demonstration, Verbal cues, and Handouts Education comprehension: verbalized understanding, returned demonstration, and needs further education     HOME EXERCISE PROGRAM: Access Code: [email or web address] URL: [email or web address] Date: 10/21/2021 Prepared by: Elsie Ra  Exercises - Standing Shoulder Flexion AAROM with Dowel  - 2 x daily - 6 x weekly - 2-3 sets - 10 reps - Supine Bilateral Shoulder External Rotation with Resistance around Wrists  - 2 x daily - 6 x weekly - 2-3 sets - 10 reps - Supine Shoulder Press  - 2 x daily - 6 x weekly - 2-3 sets - 10 reps - Sidelying Shoulder Flexion 15 Degrees  - 2 x daily - 6 x weekly - 2-3 sets - 10 reps - Sidelying Shoulder External Rotation  - 2 x daily - 6 x weekly - 2-3 sets - 10 reps - Sidelying Shoulder Horizontal Abduction  - 2 x daily - 6 x weekly - 2-3 sets - 10 reps   ASSESSMENT:   CLINICAL IMPRESSION: She demonstrated improved strength today in her Rt shoulder with better AROM measurements and ability to lift her arm. She is still weak with this and has to compensate at times but as long as she is not having pain will work to improve this to her tolerance to improve functional use of her Rt arm.   OBJECTIVE IMPAIRMENTS: decreased activity tolerance, decreased endurance, decreased mobility, decreased ROM, decreased strength, impaired flexibility, impaired LE use, postural dysfunction, and pain.   ACTIVITY LIMITATIONS:  lifting, carry,  cleaning, dressing, bathing, driving, and or occupation   REHAB POTENTIAL: Good   CLINICAL DECISION MAKING: stable/uncomplicated   EVALUATION COMPLEXITY:  Low     GOALS: Short term PT Goals Target date: 08/12/2021 Pt will be I and compliant with HEP. Baseline:  Goal status: MET Pt will improve Rt shoulder abduction and flexion PROM >90 deg Baseline: Goal status: MET   Long term PT goals Target date: 10/07/2021 Pt will improve Rt shoulder ROM to [facility] to improve functional mobility Baseline: Goal status: On going 10/09/2021       2.    Pt will improve  Rt shoulder strength to at least 4/5 MMT to improve functional strength Baseline: Goal status: On going 10/09/2021 Pt will improve FOTO to at least 48% functional to show improved function Baseline: 45 Goal status: Met 10/09/2021 Pt will reduce pain to overall less than 2-3/10 with usual activity and work activity. Baseline: 6/10 (On Going 6/152023) Goal status: On Going 10/09/2021 Pt will be able to dress herself and fix her hair without more than mild complaints in Rt UE.                  Goal status: On going 10/09/2021  PLAN: PT FREQUENCY: 2 times per week    PT DURATION: 4 weeks   PLANNED INTERVENTIONS (unless contraindicated): aquatic PT, Canalith repositioning,  cryotherapy, Electrical stimulation, Iontophoresis with 4 mg/ml dexamethasome, Moist heat, traction, Ultrasound, gait training, Therapeutic exercise, balance training, neuromuscular re-education, patient/family education, prosthetic training, manual techniques, passive ROM, dry needling, taping, vasopnuematic device, vestibular, spinal manipulations, joint manipulations   PLAN FOR NEXT SESSION: Continue to progress scapular, RTC and general R UE strength as able.  Progressions into reaching and overhead function.  15# limit per MD for strengthening.   Debbe Odea, PT, DPT 10/14/2021, 10:55 AM

## 2021-11-06 ENCOUNTER — Ambulatory Visit: Payer: Medicare HMO | Admitting: Rehabilitative and Restorative Service Providers"

## 2021-11-06 ENCOUNTER — Encounter: Payer: Self-pay | Admitting: Rehabilitative and Restorative Service Providers"

## 2021-11-06 DIAGNOSIS — M25511 Pain in right shoulder: Secondary | ICD-10-CM | POA: Diagnosis not present

## 2021-11-06 DIAGNOSIS — R600 Localized edema: Secondary | ICD-10-CM

## 2021-11-06 DIAGNOSIS — M6281 Muscle weakness (generalized): Secondary | ICD-10-CM

## 2021-11-06 NOTE — Therapy (Signed)
OUTPATIENT PHYSICAL THERAPY TREATMENT NOTE   Patient Name: Joanna Ray MRN: [id number] DOB:10-11-1950, 71 y.o., female Today's Date: 11/06/2021    END OF SESSION:   PT End of Session - 11/06/21 1522     Visit Number 20    Number of Visits 24    Date for PT Re-Evaluation 12/12/21    Authorization Type [employer] MCR    Authorization - Visit Number 20    Authorization - Number of Visits 36    Progress Note Due on Visit 25   last one done #15   PT Start Time 8676    PT Stop Time 1600    PT Time Calculation (min) 43 min    Activity Tolerance Patient tolerated treatment well;No increased pain    Behavior During Therapy WFL for tasks assessed/performed              Past Medical History:  Diagnosis Date   Anxiety    GERD (gastroesophageal reflux disease)    Hyperlipidemia    Hypertension    Urticaria    Past Surgical History:  Procedure Laterality Date   CATARACT EXTRACTION, BILATERAL Bilateral    DILATION AND CURETTAGE OF UTERUS     Patient Active Problem List   Diagnosis Date Noted   Right shoulder pain 08/26/2020   Plasmacytoma ([place]) 06/28/2020   Angioedema 01/21/2019   Vertigo 09/28/2016   Hyperlipidemia 09/25/2011   Hypertension     THERAPY DIAG:  Muscle weakness (generalized)  Right shoulder pain, unspecified chronicity  Acute pain of right shoulder  Localized edema   PCP: [name], MD   REFERRING PROVIDER: Janice Coffin, MD   REFERRING DIAG: [phone] (ICD-10-CM) - 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  She notes dishes and doing her hair are getting easier.   PAIN:  Are you having pain? Yes: NPRS scale: 0-4/10 this week Pain location: Rt shoulder Pain description: Sore with reaching, unable to function overhead Aggravating factors: Getting dressed, R shoulder use, particularly reaching and overhead Relieving factors: Rest and exercises, pain medication   PRECAUTIONS: S/P right proximal humerus replacement 05/01/21, start with PROM 0-90  forward flexion up to 120 by 6 weeks. Abd to 120, ER to 40, IR to 60. Add AROM to same limits. Strengthening to add at 3-4 weeks max lifting 15#"   WEIGHT BEARING RESTRICTIONS None listed however limit weight bearing to Rt UE at this time due to post op status  OCCUPATION: Math and Reading Teacher to adults   PLOF: Independent   PATIENT GOALS : Be able to comb and fix her hair and be able to raise her arm again   OBJECTIVE:    DIAGNOSTIC FINDINGS:  XR in care everywhere section   PATIENT SURVEYS:  FOTO 45% funcitonal, goal is 48%    UPPER EXTREMITY ROM:     AROM/PROM Right supine 07/15/2021 Right  sitting 08/07/21 Right Standing 08/21/21 Right  Supine Passive 08/26/21 Right Supine Passive 08/28/2021 Right Active in standing 09/10/21 R passive supine 10/03/2021 Right active in standing 11/04/21  Shoulder flexion 0/50  10/90 60 140 135 60 120 degrees 90 AROM  Shoulder extension 40/          Shoulder abduction 30/70  50/85 80 110  80 with elbow in 90 deg of flexiona  80 AROM with arm extended  Shoulder horizontal adduction adduction         35 degrees   Shoulder internal rotation 50/60 at 45 deg abd    75 Shoulder abd 45 degrees 70 degrees at 70 degrees abduction  110 degrees    Shoulder external rotation 10/25 at 45 deg abd  30/ 55/ 70  Shoulder abd to 45 degrees  60 degrees at 70 degrees abduction   100 degrees 45 AROM  (Blank rows = not  tested)   UPPER EXTREMITY MMT:   MMT Right 07/15/2021  Right 08/12/21 L/R in pounds 08/28/2021 R in pounds 10/09/2021  Shoulder flexion 2  2    Shoulder extension 3  3+    Shoulder abduction 2  2    Shoulder adduction        Shoulder internal rotation 3  3+ 40.1/5.3 10 pounds  Shoulder external rotation 2  2+ 25.1/<3 (0.0) < 3 pounds  Middle trapezius        Lower trapezius        Elbow flexion        Elbow extension        Grip strength (lbs)        (Blank rows = not tested)     PALPATION:  07/15/21: TTP Rt anterior shoulder  TODAY'S TREATMENT 11/06/2021 UBE Push/Pull/Rest :20 intervals for 5 minutes Resistance level 5, try to maintain 70 RPM Pull to chest/Row Blue theraband 20X Theraband ER 2 sets of 10 (1 set concentric/eccentric and 1 set walk out trying to maintain isometric hold) Yellow Theraband IR Yellow 10X Supine arm raises with pillow under elbow 2 sets of 10 active full range Side-lie ER 10X slow eccentrics UE Ranger flexion and scaption 10X each  Standing AAROM horizontal add-abd rolling red ball across tall mat table 3 minutes  NeuroMuscular re-education: Body blade forward/back; IR/ER; abduction, protract/retract 3X 20 seconds each (+ rest)   11/04/21 -UBE L4 2.5 min fwd,2.5 min reverse -Pulleys 2 min flexion, 2 min scaption -Standing AAROM horizontal add-abd rolling red ball across tall mat table -Standing AAROM bilat shoulder flexion pball walk up wall X10 -Standing AAROM shoulder abduction eccentrics 2X10 (first set elbow extended, 2nd set shortor lever elbows bent) -Standing shoulder ER with yellow band 2X10  Vasopnuematic device X 10 min to left shoulder,  medium compression, lowest temp.   10/23/21 -UBE L4 2.5 min fwd,2.5 min reverse -Pulleys 2 min flexion, 2 min scaption -Standing AAROM shoulder flexion with2# bar X 5 then 1# bar sets of 10, 5, against wall to minimize lumbar extension -Standing AAROM shoulder abduction eccentrics 2X10 -Supine Rt arm  raise chest press  3 sets of 10  -Sidelying Rt shoulder ER 2X 10 holding 3 sec  -Sidelying Rt shoulder flexion AROM 2X10 -Sidelying Rt shoulder abduction AROM short lever (elbows bent) 2X10  Vasopnuematic device X 10 min to left shoulder, medium compression, lowest temp.   PATIENT EDUCATION: 08/28/21 Education details: Reviewed reassessment findings and updated HEP with strength emphasis Person educated: Patient Education method: Explanation, Demonstration, Verbal cues, and Handouts Education comprehension: verbalized understanding, returned demonstration, and needs further education     HOME EXERCISE PROGRAM: Access Code: [email or web address] URL: [email or web address] Date: 10/21/2021 Prepared by: Elsie Ra  Exercises - Standing Shoulder Flexion AAROM with Dowel  - 2 x daily - 6 x weekly - 2-3 sets - 10 reps - Supine Bilateral Shoulder External Rotation with Resistance around Wrists  - 2 x daily - 6 x weekly - 2-3 sets - 10 reps - Supine Shoulder Press  - 2 x daily - 6 x weekly - 2-3 sets - 10 reps - Sidelying Shoulder Flexion 15 Degrees  - 2 x daily - 6 x weekly - 2-3 sets - 10 reps - Sidelying Shoulder External Rotation  - 2 x daily - 6 x weekly - 2-3 sets - 10 reps - Sidelying Shoulder Horizontal Abduction  - 2 x daily - 6 x weekly - 2-3 sets - 10 reps   ASSESSMENT:   CLINICAL IMPRESSION: Alle is better able to get her R arm up to do her hair, do dishes and "some" sweeping.  Reaching and overhead function are still very limited and the functional focus of continued work.   OBJECTIVE IMPAIRMENTS: decreased activity tolerance, decreased endurance, decreased mobility, decreased ROM, decreased strength, impaired flexibility, impaired LE use, postural dysfunction, and pain.   ACTIVITY LIMITATIONS:  lifting, carry,  cleaning, dressing, bathing, driving, and or occupation   REHAB POTENTIAL: Good   CLINICAL DECISION MAKING: stable/uncomplicated   EVALUATION COMPLEXITY:  Low     GOALS: Short term PT Goals Target date: 08/12/2021 Pt will be I and compliant with HEP. Baseline:  Goal status: MET Pt will improve Rt shoulder abduction and flexion PROM >90 deg Baseline: Goal status: MET   Long term PT goals Target date: 10/07/2021 Pt will improve Rt shoulder ROM to [facility] to improve functional mobility Baseline: Goal status: On going 10/09/2021       2.    Pt will improve  Rt shoulder strength to at least 4/5 MMT to improve functional strength Baseline: Goal status: On going 10/09/2021 Pt will improve FOTO to at least 48% functional to show improved function Baseline: 45 Goal status: Met 10/09/2021 Pt will reduce pain to overall less than 2-3/10 with usual activity and work activity. Baseline: 6/10 (On Going 11/06/2021) Goal status: On Going 10/09/2021 Pt will be able to dress herself and fix her hair without more than mild complaints in Rt UE.                  Goal status: Met 11/06/2021  PLAN: PT FREQUENCY: 2 times per week    PT DURATION: 4 weeks   PLANNED INTERVENTIONS (unless contraindicated): aquatic PT, Canalith repositioning, cryotherapy, Electrical stimulation, Iontophoresis with 4 mg/ml dexamethasome, Moist heat,  traction, Ultrasound, gait training, Therapeutic exercise, balance training, neuromuscular re-education, patient/family education, prosthetic training, manual techniques, passive ROM, dry needling, taping, vasopnuematic device, vestibular, spinal manipulations, joint manipulations   PLAN FOR NEXT SESSION: Continue to progress scapular, RTC and general R UE strength as appropriate.  Progressions into reaching and overhead function.  15# limit per MD for strengthening.   Farley Ly  PT, MPT

## 2021-11-10 ENCOUNTER — Encounter: Payer: Self-pay | Admitting: Physical Therapy

## 2021-11-10 ENCOUNTER — Ambulatory Visit (INDEPENDENT_AMBULATORY_CARE_PROVIDER_SITE_OTHER): Payer: Medicare HMO | Admitting: Physical Therapy

## 2021-11-10 DIAGNOSIS — M6281 Muscle weakness (generalized): Secondary | ICD-10-CM | POA: Diagnosis not present

## 2021-11-10 DIAGNOSIS — M25511 Pain in right shoulder: Secondary | ICD-10-CM | POA: Diagnosis not present

## 2021-11-10 DIAGNOSIS — R600 Localized edema: Secondary | ICD-10-CM | POA: Diagnosis not present

## 2021-11-10 NOTE — Therapy (Signed)
OUTPATIENT PHYSICAL THERAPY TREATMENT NOTE   Patient Name: ROSARIO DUEY MRN: [id number] DOB:02/21/1951, 71 y.o., female Today's Date: 11/10/2021    END OF SESSION:   PT End of Session - 11/10/21 1516     Visit Number 21    Number of Visits 24    Date for PT Re-Evaluation 12/12/21    Authorization Type [employer] M[employer]    Authorization - Visit Number 20    Authorization - Number of Visits 36    Progress Note Due on Visit 25   last one done #15   PT Start Time 1515    PT Stop Time 1600    PT Time Calculation (min) 45 min    Activity Tolerance Patient tolerated treatment well;No increased pain    Behavior During Therapy WFL for tasks assessed/performed              Past Medical History:  Diagnosis Date   Anxiety    GERD (gastroesophageal reflux disease)    Hyperlipidemia    Hypertension    Urticaria    Past Surgical History:  Procedure Laterality Date   CATARACT EXTRACTION, BILATERAL Bilateral    DILATION AND CURETTAGE OF UTERUS     Patient Active Problem List   Diagnosis Date Noted   Right shoulder pain 08/26/2020   Plasmacytoma ([place]) 06/28/2020   Angioedema 01/21/2019   Vertigo 09/28/2016   Hyperlipidemia 09/25/2011   Hypertension     THERAPY DIAG:  Muscle weakness (generalized)  Right shoulder pain, unspecified chronicity  Acute pain of right shoulder  Localized edema   PCP: [name], MD   REFERRING PROVIDER: Janice Coffin, MD   REFERRING DIAG: [phone] (ICD-10-CM) - 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   PAIN:  Are you having pain? Yes: NPRS scale: 2/10  Pain location: Rt shoulder Pain description: Sore with reaching, unable to function overhead Aggravating factors: Getting dressed, R shoulder use, particularly reaching and overhead Relieving factors: Rest and exercises, pain medication   PRECAUTIONS: S/P right proximal humerus replacement 05/01/21, start with PROM 0-90 forward flexion up to 120 by 6 weeks. Abd to  120, ER to 40, IR to 60. Add AROM to same limits. Strengthening to add at 3-4 weeks max lifting 15#"   WEIGHT BEARING RESTRICTIONS None listed however limit weight bearing to Rt UE at this time due to post op status  OCCUPATION: Math and Reading Teacher to adults   PLOF: Independent   PATIENT GOALS : Be able to comb and fix her hair and be able to raise her arm again   OBJECTIVE:    DIAGNOSTIC FINDINGS:  XR in care everywhere section   PATIENT SURVEYS:  FOTO 45% funcitonal, goal is 48%    UPPER EXTREMITY ROM:     AROM/PROM Right supine 07/15/2021 Right  sitting 08/07/21 Right Standing 08/21/21 Right  Supine Passive 08/26/21 Right Supine Passive 08/28/2021 Right Active in standing 09/10/21 R passive supine 10/03/2021 Right active in standing 11/04/21  Shoulder flexion 0/50  10/90 60 140 135 60 120 degrees 90 AROM  Shoulder extension 40/          Shoulder abduction 30/70  50/85 80 110  80 with elbow in 90 deg of flexiona  80 AROM with arm extended  Shoulder horizontal adduction adduction         35 degrees   Shoulder internal rotation 50/60 at 45 deg abd    75 Shoulder abd 45 degrees 70 degrees at 70 degrees abduction  110 degrees    Shoulder external rotation 10/25 at 45 deg abd  30/ 55/ 70  Shoulder abd to 45 degrees  60 degrees at 70 degrees abduction   100 degrees 45 AROM  (Blank rows = not tested)   UPPER EXTREMITY MMT:   MMT Right 07/15/2021   Right 08/12/21 L/R in pounds 08/28/2021 R in pounds 10/09/2021  Shoulder flexion 2  2    Shoulder extension 3  3+    Shoulder abduction 2  2    Shoulder adduction        Shoulder internal rotation 3  3+ 40.1/5.3 10 pounds  Shoulder external rotation 2  2+ 25.1/<3 (0.0) < 3 pounds  Middle trapezius        Lower trapezius        Elbow flexion        Elbow extension        Grip strength (lbs)        (Blank rows = not tested)     PALPATION:  07/15/21: TTP Rt anterior shoulder  TODAY'S TREATMENT 11/10/21 -UBE L5 2.5 min fwd,2.5 min reverse -Rows blue X20 -Shoulder extensions green  X20  -Shoulder ER with yellow 2X10 -Shoulder IR with green X 20 -Standing AAROM shoulder flexion with 2# bar 2 X 5 -Standing AAROM shoulder abduction (short lever elbows bent) 2X10 -Supine Rt arm raise X10 -Sidelying Rt shoulder ER 2X 10 holding 3 sec  -Sidelying Rt shoulder flexion AROM 2X10 -Sidelying Rt shoulder abduction AROM short lever (elbows bent) 2X10  Vasopnuematic device X 10 min to left shoulder, medium compression, lowest temp.  11/06/2021 UBE Push/Pull/Rest :20 intervals for 5 minutes Resistance level 5, try to maintain 70 RPM Pull to chest/Row Blue theraband 20X Theraband ER 2 sets of 10 (1 set concentric/eccentric and 1 set walk out trying to maintain isometric hold) Yellow Theraband IR Yellow 10X Supine arm raises with pillow under elbow 2 sets of 10 active full range Side-lie ER 10X slow eccentrics UE Ranger flexion and scaption 10X each  Standing AAROM horizontal add-abd rolling red ball across tall mat table 3 minutes  NeuroMuscular re-education:  Body blade forward/back; IR/ER; abduction, protract/retract 3X 20 seconds each (+ rest)   11/04/21 -UBE L4 2.5 min fwd,2.5 min reverse -Pulleys 2 min flexion, 2 min scaption -Standing AAROM horizontal add-abd rolling red ball across tall mat table -Standing AAROM bilat shoulder flexion pball walk up wall X10 -Standing AAROM shoulder  abduction eccentrics 2X10 (first set elbow extended, 2nd set shortor lever elbows bent) -Standing shoulder ER with yellow band 2X10  Vasopnuematic device X 10 min to left shoulder, medium compression, lowest temp.    PATIENT EDUCATION: 08/28/21 Education details: Reviewed reassessment findings and updated HEP with strength emphasis Person educated: Patient Education method: Explanation, Demonstration, Verbal cues, and Handouts Education comprehension: verbalized understanding, returned demonstration, and needs further education     HOME EXERCISE PROGRAM: Access Code: [email or web address] URL: [email or web address] Date: 10/21/2021 Prepared by: Elsie Ra  Exercises - Standing Shoulder Flexion AAROM with Dowel  - 2 x daily - 6 x weekly - 2-3 sets - 10 reps - Supine Bilateral Shoulder External Rotation with Resistance around Wrists  - 2 x daily - 6 x weekly - 2-3 sets - 10 reps - Supine Shoulder Press  - 2 x daily - 6 x weekly - 2-3 sets - 10 reps - Sidelying Shoulder Flexion 15 Degrees  - 2 x daily - 6 x weekly - 2-3 sets - 10 reps - Sidelying Shoulder External Rotation  - 2 x daily - 6 x weekly - 2-3 sets - 10 reps - Sidelying Shoulder Horizontal Abduction  - 2 x daily - 6 x weekly - 2-3 sets - 10 reps   ASSESSMENT:   CLINICAL IMPRESSION: She is doing well in terms of pain levels but still with difficulty reaching overhead due to weakness in her Rt shoulder. We have emphasized strengthening in PT and will continue to work to improve this as tolerated to improve overall functional use of her Rt UE.   OBJECTIVE IMPAIRMENTS: decreased activity tolerance, decreased endurance, decreased mobility, decreased ROM, decreased strength, impaired flexibility, impaired LE use, postural dysfunction, and pain.   ACTIVITY LIMITATIONS:  lifting, carry,  cleaning, dressing, bathing, driving, and or occupation   REHAB POTENTIAL: Good   CLINICAL DECISION MAKING: stable/uncomplicated    EVALUATION COMPLEXITY: Low     GOALS: Short term PT Goals Target date: 08/12/2021 Pt will be I and compliant with HEP. Baseline:  Goal status: MET Pt will improve Rt shoulder abduction and flexion PROM >90 deg Baseline: Goal status: MET   Long term PT goals Target date: 12/07/2021 Pt will improve Rt shoulder ROM to [facility] to improve functional mobility Baseline: Goal status: On going        2.    Pt will improve  Rt shoulder strength to at least 4/5 MMT to improve functional strength Baseline: Goal status: On going  Pt will improve FOTO to at least 48% functional to show improved function Baseline: 45 Goal status: Met 10/09/2021 Pt will reduce pain to overall less than 2-3/10 with usual activity and work activity. Baseline: 6/10  Goal status:(On Going 11/06/2021) Pt will be able to dress herself and fix her hair without more than mild complaints in Rt UE.                  Goal status: Met 11/06/2021  PLAN: PT FREQUENCY: 2 times per week    PT DURATION: 4 weeks   PLANNED INTERVENTIONS (unless contraindicated): aquatic PT, Canalith repositioning, cryotherapy, Electrical stimulation, Iontophoresis with 4 mg/ml dexamethasome, Moist heat, traction, Ultrasound, gait  training, Therapeutic exercise, balance training, neuromuscular re-education, patient/family education, prosthetic training, manual techniques, passive ROM, dry needling, taping, vasopnuematic device, vestibular, spinal manipulations, joint manipulations   PLAN FOR NEXT SESSION: Continue to progress scapular, RTC and general R UE strength as appropriate.  Progressions into reaching and overhead function.  15# limit per MD for strengthening.   Elsie Ra, PT, DPT 11/10/21 3:48 PM

## 2021-11-13 ENCOUNTER — Ambulatory Visit (INDEPENDENT_AMBULATORY_CARE_PROVIDER_SITE_OTHER): Payer: Medicare HMO | Admitting: Rehabilitative and Restorative Service Providers"

## 2021-11-13 ENCOUNTER — Encounter: Payer: Self-pay | Admitting: Rehabilitative and Restorative Service Providers"

## 2021-11-13 DIAGNOSIS — M25511 Pain in right shoulder: Secondary | ICD-10-CM | POA: Diagnosis not present

## 2021-11-13 DIAGNOSIS — M6281 Muscle weakness (generalized): Secondary | ICD-10-CM

## 2021-11-13 DIAGNOSIS — R600 Localized edema: Secondary | ICD-10-CM | POA: Diagnosis not present

## 2021-11-13 NOTE — Therapy (Signed)
OUTPATIENT PHYSICAL THERAPY TREATMENT NOTE   Patient Name: Joanna Ray MRN: [id number] DOB:1950-09-10, 71 y.o., female Today's Date: 11/13/2021    END OF SESSION:   PT End of Session - 11/13/21 1517     Visit Number 22    Number of Visits 24    Date for PT Re-Evaluation 12/12/21    Authorization Type [employer] MCR    Authorization - Visit Number 20    Authorization - Number of Visits 36    Progress Note Due on Visit 25   last one done #15   PT Start Time 9379    PT Stop Time 0240    PT Time Calculation (min) 49 min    Activity Tolerance Patient tolerated treatment well;No increased pain    Behavior During Therapy WFL for tasks assessed/performed             Past Medical History:  Diagnosis Date   Anxiety    GERD (gastroesophageal reflux disease)    Hyperlipidemia    Hypertension    Urticaria    Past Surgical History:  Procedure Laterality Date   CATARACT EXTRACTION, BILATERAL Bilateral    DILATION AND CURETTAGE OF UTERUS     Patient Active Problem List   Diagnosis Date Noted   Right shoulder pain 08/26/2020   Plasmacytoma ([place]) 06/28/2020   Angioedema 01/21/2019   Vertigo 09/28/2016   Hyperlipidemia 09/25/2011   Hypertension     THERAPY DIAG:  Muscle weakness (generalized)  Right shoulder pain, unspecified chronicity  Acute pain of right shoulder  Localized edema   PCP: [name], MD   REFERRING PROVIDER: Janice Coffin, MD   REFERRING DIAG: [phone] (ICD-10-CM) - 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   PAIN:  Are you having pain? Yes: NPRS scale: 2/10  Pain location: Rt shoulder Pain description: Sore with reaching, unable to function overhead Aggravating factors: Getting dressed, R shoulder use, particularly reaching and overhead Relieving factors: Rest and exercises, pain medication   PRECAUTIONS: S/P right proximal humerus replacement 05/01/21, start with PROM 0-90 forward flexion up to 120 by 6 weeks. Abd to 120, ER to 40,  IR to 60. Add AROM to same limits. Strengthening to add at 3-4 weeks max lifting 15#"   WEIGHT BEARING RESTRICTIONS None listed however limit weight bearing to Rt UE at this time due to post op status  OCCUPATION: Math and Reading Teacher to adults   PLOF: Independent   PATIENT GOALS : Be able to comb and fix her hair and be able to raise her arm again   OBJECTIVE:    DIAGNOSTIC FINDINGS:  XR in care everywhere section   PATIENT SURVEYS:  FOTO 45% funcitonal, goal is 48%    UPPER EXTREMITY ROM:     AROM/PROM Right supine 07/15/2021 Right  sitting 08/07/21 Right Standing 08/21/21 Right  Supine Passive 08/26/21 Right Supine Passive 08/28/2021 Right Active in standing 09/10/21 R passive supine 10/03/2021 Right active in standing 11/04/21  Shoulder flexion 0/50  10/90 60 140 135 60 120 degrees 90 AROM  Shoulder extension 40/          Shoulder abduction 30/70  50/85 80 110  80 with elbow in 90 deg of flexiona  80 AROM with arm extended  Shoulder horizontal adduction adduction         35 degrees   Shoulder internal rotation 50/60 at 45 deg abd    75 Shoulder abd 45 degrees 70 degrees at 70 degrees abduction  110 degrees    Shoulder external rotation 10/25 at 45 deg abd  30/ 55/ 70  Shoulder abd to 45 degrees  60 degrees at 70 degrees abduction   100 degrees 45 AROM  (Blank rows = not tested)   UPPER EXTREMITY MMT:   MMT Right 07/15/2021   Right 08/12/21 L/R in pounds 08/28/2021 R in pounds 10/09/2021 R in pounds 7/X/2023  Shoulder flexion 2  2     Shoulder extension 3  3+     Shoulder abduction 2  2     Shoulder adduction         Shoulder internal rotation 3  3+ 40.1/5.3 10 pounds   Shoulder external rotation 2  2+ 25.1/<3 (0.0) < 3 pounds   Middle trapezius         Lower trapezius         Elbow flexion         Elbow extension         Grip strength (lbs)         (Blank rows = not tested)     PALPATION:  07/15/21: TTP Rt anterior shoulder  TODAY'S TREATMENT 11/13/2021  UBE Push/Pull/Rest :20 intervals for 8 minutes Resistance level 5, try to maintain 70 RPM Pull to chest/Row Blue theraband 20X Theraband ER 2 sets of 10 (1 set concentric/eccentric and 1 set walk out trying to maintain isometric hold) Yellow Theraband IR Yellow 10X Supine arm raises with pillow under elbow 2 sets of 10 active full range Side-lie ER 10X slow eccentrics UE Ranger flexion and scaption 10X each  Standing AAROM flex/extend rolling red ball up/down wall 10X  NeuroMuscular re-education: Body blade forward/back; IR/ER; abduction, protract/retract; reach for protract/retract and javelin hold (Elbow bent above shoulder) 3X 10 seconds each (+ rest)  Vasopnuematic device X 10 min to right shoulder, medium compression, 34*   11/10/21 -UBE L5 2.5 min fwd,2.5 min reverse -Rows blue X20 -Shoulder extensions green  X20  -Shoulder ER with yellow 2X10 -Shoulder IR with green X 20 -Standing AAROM shoulder flexion with 2# bar 2 X 5 -Standing AAROM shoulder abduction (short lever elbows bent) 2X10 -Supine  Rt arm raise X10 -Sidelying Rt shoulder ER 2X 10 holding 3 sec  -Sidelying Rt shoulder flexion AROM 2X10 -Sidelying Rt shoulder abduction AROM short lever (elbows bent) 2X10  Vasopnuematic device X 10 min to left shoulder, medium compression, lowest temp.   11/06/2021 UBE Push/Pull/Rest :20 intervals for 5 minutes Resistance level 5, try to  maintain 70 RPM Pull to chest/Row Blue theraband 20X Theraband ER 2 sets of 10 (1 set concentric/eccentric and 1 set walk out trying to maintain isometric hold) Yellow Theraband IR Yellow 10X Supine arm raises with pillow under elbow 2 sets of 10 active full range Side-lie ER 10X slow eccentrics UE Ranger flexion and scaption 10X each  Standing AAROM horizontal add-abd rolling red ball across tall mat table 3 minutes  NeuroMuscular re-education: Body blade forward/back; IR/ER; abduction, protract/retract 3X 20 seconds each (+ rest)   PATIENT EDUCATION: 08/28/21 Education details: Reviewed reassessment findings and updated HEP with strength emphasis Person educated: Patient Education method: Explanation, Demonstration, Verbal cues, and Handouts Education comprehension: verbalized understanding, returned demonstration, and needs further education     HOME EXERCISE PROGRAM: Access Code: [email or web address] URL: [email or web address] Date: 10/21/2021 Prepared by: Elsie Ra  Exercises - Standing Shoulder Flexion AAROM with Dowel  - 2 x daily - 6 x weekly - 2-3 sets - 10 reps - Supine Bilateral Shoulder External Rotation with Resistance around Wrists  - 2 x daily - 6 x weekly - 2-3 sets - 10 reps - Supine Shoulder Press  - 2 x daily - 6 x weekly - 2-3 sets - 10 reps - Sidelying Shoulder Flexion 15 Degrees  - 2 x daily - 6 x weekly - 2-3 sets - 10 reps - Sidelying Shoulder External Rotation  - 2 x daily - 6 x weekly - 2-3 sets - 10 reps - Sidelying Shoulder Horizontal Abduction  - 2 x daily - 6 x weekly - 2-3 sets - 10 reps   ASSESSMENT:   CLINICAL IMPRESSION: Joanna Ray has noticeably better strength as compared to just a few weeks ago.  Isolated shoulder ER strength is still very weak so scapular stabilizers and deltoid strengthening will be of continued importance.  Continue strength, endurance and functional emphasis to meet long-term goals.   OBJECTIVE IMPAIRMENTS: decreased  activity tolerance, decreased endurance, decreased mobility, decreased ROM, decreased strength, impaired flexibility, impaired LE use, postural dysfunction, and pain.   ACTIVITY LIMITATIONS:  lifting, carry,  cleaning, dressing, bathing, driving, and or occupation   REHAB POTENTIAL: Good   CLINICAL DECISION MAKING: stable/uncomplicated   EVALUATION COMPLEXITY: Low     GOALS: Short term PT Goals Target date: 08/12/2021 Pt will be I and compliant with HEP. Baseline:  Goal status: MET Pt will improve Rt shoulder abduction and flexion PROM >90 deg Baseline: Goal status: MET   Long term PT goals Target date: 12/07/2021 Pt will improve Rt shoulder ROM to [facility] to improve functional mobility Baseline: Goal status: On going        2.    Pt will improve  Rt shoulder strength to at least 4/5 MMT to improve functional strength Baseline: Goal status: On going  Pt will improve FOTO to at least 48% functional to show improved function Baseline: 45 Goal status: Met 10/09/2021 Pt will reduce pain to overall less than 2-3/10 with usual activity and work activity. Baseline: 6/10  Goal status: (Met 11/13/2021) Pt will be able to dress herself and fix her hair without more than mild complaints in Rt UE.  Goal status: Met 11/06/2021  PLAN: PT FREQUENCY: 1-2 times per week    PT DURATION: 4 weeks   PLANNED INTERVENTIONS (unless contraindicated): aquatic PT, Canalith repositioning, cryotherapy, Electrical stimulation, Iontophoresis with 4 mg/ml dexamethasome, Moist heat, traction, Ultrasound, gait training, Therapeutic exercise, balance training, neuromuscular re-education, patient/family education, prosthetic training, manual techniques, passive ROM, dry needling, taping, vasopnuematic device, vestibular, spinal manipulations, joint manipulations   PLAN FOR NEXT SESSION: Continue to progress scapular, RTC and general R UE strength as appropriate.  Progressions into reaching and overhead  function.  15# limit per MD for strengthening.  Reassess visit 24 (strength, AROM, FOTO).  Farley Ly PT, MPT 11/13/21 4:02 PM

## 2021-11-18 ENCOUNTER — Encounter: Payer: Self-pay | Admitting: Rehabilitative and Restorative Service Providers"

## 2021-11-18 ENCOUNTER — Ambulatory Visit (INDEPENDENT_AMBULATORY_CARE_PROVIDER_SITE_OTHER): Payer: Medicare HMO | Admitting: Rehabilitative and Restorative Service Providers"

## 2021-11-18 DIAGNOSIS — R600 Localized edema: Secondary | ICD-10-CM | POA: Diagnosis not present

## 2021-11-18 DIAGNOSIS — M6281 Muscle weakness (generalized): Secondary | ICD-10-CM | POA: Diagnosis not present

## 2021-11-18 DIAGNOSIS — M25511 Pain in right shoulder: Secondary | ICD-10-CM | POA: Diagnosis not present

## 2021-11-18 NOTE — Therapy (Signed)
OUTPATIENT PHYSICAL THERAPY TREATMENT NOTE   Patient Name: Joanna Ray MRN: [id number] DOB:[birth year], 71 y.o., female Today's Date: 11/18/2021   END OF SESSION:   PT End of Session - 11/18/21 1436     Visit Number 23    Number of Visits 24    Date for PT Re-Evaluation 12/12/21    Authorization Type [employer] M[employer]    Authorization - Visit Number 23    Authorization - Number of Visits 36    Progress Note Due on Visit 25   last one done #15   PT Start Time 1431    PT Stop Time 1522    PT Time Calculation (min) 51 min    Activity Tolerance Patient tolerated treatment well;No increased pain    Behavior During Therapy WFL for tasks assessed/performed             Past Medical History:  Diagnosis Date   Anxiety    GERD (gastroesophageal reflux disease)    Hyperlipidemia    Hypertension    Urticaria    Past Surgical History:  Procedure Laterality Date   CATARACT EXTRACTION, BILATERAL Bilateral    DILATION AND CURETTAGE OF UTERUS     Patient Active Problem List   Diagnosis Date Noted   Right shoulder pain 08/26/2020   Plasmacytoma ([place]) 06/28/2020   Angioedema 01/21/2019   Vertigo 09/28/2016   Hyperlipidemia 09/25/2011   Hypertension     THERAPY DIAG:  Muscle weakness (generalized)  Right shoulder pain, unspecified chronicity  Acute pain of right shoulder  Localized edema   PCP: [name], MD   REFERRING PROVIDER: Janice Coffin, MD   REFERRING DIAG: [phone] (ICD-10-CM) - 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  Sleeping is not a problem.  She is able to reach to about shoulder height and rests her elbow on the counter when doing her hair.   PAIN:  Are you having pain? Yes: NPRS scale: 2/10  Pain location: Rt shoulder Pain description: Sore with reaching, unable to function overhead Aggravating factors: Getting dressed, R shoulder use, particularly reaching and overhead Relieving factors: Rest and exercises, pain medication   PRECAUTIONS: S/P right proximal humerus replacement 05/01/21, start  with PROM 0-90 forward flexion up to 120 by 6 weeks. Abd to 120, ER to 40, IR to 60. Add AROM to same limits. Strengthening to add at 3-4 weeks max lifting 15#"   WEIGHT BEARING RESTRICTIONS None listed however limit weight bearing to Rt UE at this time due to post op status  OCCUPATION: Math and Reading Teacher to adults   PLOF: Independent   PATIENT GOALS : Be able to comb and fix her hair and be able to raise her arm again   OBJECTIVE:    DIAGNOSTIC FINDINGS:  XR in care everywhere section   PATIENT SURVEYS:    FOTO 59 (Goal met) FOTO 45% funcitonal, goal is 48%    UPPER EXTREMITY ROM:     AROM/PROM Right supine 07/15/2021 Right  sitting 08/07/21 Right Standing 08/21/21 Right  Supine Passive 08/26/21 Right Supine Passive 08/28/2021 Right Active in standing 09/10/21 R passive supine 10/03/2021 Right active in standing 11/04/21  Shoulder flexion 0/50  10/90 60 140 135 60 120 degrees 90 AROM  Shoulder extension 40/          Shoulder abduction 30/70  50/85 80 110  80 with elbow in 90 deg of flexiona  80 AROM with arm extended  Shoulder horizontal adduction adduction         35 degrees   Shoulder internal rotation 50/60 at 45 deg abd    75 Shoulder abd 45 degrees 70 degrees at 70 degrees abduction  110 degrees    Shoulder external rotation 10/25 at 45 deg abd  30/ 55/ 70  Shoulder abd to 45 degrees  60 degrees at 70 degrees abduction   100  degrees 45 AROM  (Blank rows = not tested)   UPPER EXTREMITY MMT:   MMT Right 07/15/2021  Right 08/12/21 L/R in pounds 08/28/2021 R in pounds 10/09/2021 R in pounds 7/X/2023  Shoulder flexion 2  2     Shoulder extension 3  3+     Shoulder abduction 2  2     Shoulder adduction         Shoulder internal rotation 3  3+ 40.1/5.3 10 pounds   Shoulder external rotation 2  2+ 25.1/<3 (0.0) < 3 pounds   Middle trapezius         Lower trapezius         Elbow flexion         Elbow extension         Grip strength (lbs)         (Blank rows = not tested)     PALPATION:  07/15/21: TTP Rt anterior shoulder  TODAY'S TREATMENT 11/18/2021 UBE Push/Pull/Rest :20 intervals for 8 minutes Resistance level 5, try to maintain 70 RPM Pull to chest/Row Blue theraband 20X Theraband ER 2 sets of 10 (1 set concentric/eccentric and 1 set walk out trying to maintain isometric hold) Yellow Theraband IR Yellow 10X Supine arm raises with pillow under elbow 2 sets of 10 active full range Side-lie ER 10X slow eccentrics UE Ranger flexion only 10X slow eccentrics Standing AAROM flex/extend rolling red ball up/down wall 10X  NeuroMuscular re-education: Body blade forward/back; protract/retract; reach for protract/retract and javelin hold (Elbow bent above shoulder) 3X 10 seconds each (+ rest)  Vasopnuematic device X 10 min to right shoulder, medium compression, 34*   11/13/2021  UBE Push/Pull/Rest :20 intervals for 8 minutes Resistance level 5, try to maintain 70 RPM Pull to chest/Row Blue theraband 20X Theraband ER 2 sets of 10 (  1 set concentric/eccentric and 1 set walk out trying to maintain isometric hold) Yellow Theraband IR Yellow 10X Supine arm raises with pillow under elbow 2 sets of 10 active full range Side-lie ER 10X slow eccentrics UE Ranger flexion and scaption 10X each  Standing AAROM flex/extend rolling red ball up/down wall 10X  NeuroMuscular re-education: Body blade forward/back; IR/ER;  abduction, protract/retract; reach for protract/retract and javelin hold (Elbow bent above shoulder) 3X 10 seconds each (+ rest)  Vasopnuematic device X 10 min to right shoulder, medium compression, 34*   11/10/21 -UBE L5 2.5 min fwd,2.5 min reverse -Rows blue X20 -Shoulder extensions green  X20  -Shoulder ER with yellow 2X10 -Shoulder IR with green X 20 -Standing AAROM shoulder flexion with 2# bar 2 X 5 -Standing AAROM shoulder abduction (short lever elbows bent) 2X10 -Supine Rt arm raise X10 -Sidelying Rt shoulder ER 2X 10 holding 3 sec  -Sidelying Rt shoulder flexion AROM 2X10 -Sidelying Rt shoulder abduction AROM short lever (elbows bent) 2X10  Vasopnuematic device X 10 min to left shoulder, medium compression, lowest temp.   PATIENT EDUCATION: 08/28/21 Education details: Reviewed reassessment findings and updated HEP with strength emphasis Person educated: Patient Education method: Explanation, Demonstration, Verbal cues, and Handouts Education comprehension: verbalized understanding, returned demonstration, and needs further education     HOME EXERCISE PROGRAM: Access Code: [email or web address] URL: [email or web address] Date: 10/21/2021 Prepared by: Elsie Ra  Exercises - Standing Shoulder Flexion AAROM with Dowel  - 2 x daily - 6 x weekly - 2-3 sets - 10 reps - Supine Bilateral Shoulder External Rotation with Resistance around Wrists  - 2 x daily - 6 x weekly - 2-3 sets - 10 reps - Supine Shoulder Press  - 2 x daily - 6 x weekly - 2-3 sets - 10 reps - Sidelying Shoulder Flexion 15 Degrees  - 2 x daily - 6 x weekly - 2-3 sets - 10 reps - Sidelying Shoulder External Rotation  - 2 x daily - 6 x weekly - 2-3 sets - 10 reps - Sidelying Shoulder Horizontal Abduction  - 2 x daily - 6 x weekly - 2-3 sets - 10 reps   ASSESSMENT:   CLINICAL IMPRESSION: Antanasia is pleased with her PT progress.  She is still limited with reaching and overhead function.  AROM and strength  will be reassessed next visit along with focusing in her long-term HEP.  Possible transfer into independent PT as FOTO had little change since her last assessment.   OBJECTIVE IMPAIRMENTS: decreased activity tolerance, decreased endurance, decreased mobility, decreased ROM, decreased strength, impaired flexibility, impaired LE use, postural dysfunction, and pain.   ACTIVITY LIMITATIONS:  lifting, carry,  cleaning, dressing, bathing, driving, and or occupation   REHAB POTENTIAL: Good   CLINICAL DECISION MAKING: stable/uncomplicated   EVALUATION COMPLEXITY: Low     GOALS: Short term PT Goals Target date: 08/12/2021 Pt will be I and compliant with HEP. Baseline:  Goal status: MET Pt will improve Rt shoulder abduction and flexion PROM >90 deg Baseline: Goal status: MET   Long term PT goals Target date: 12/07/2021 Pt will improve Rt shoulder ROM to [facility] to improve functional mobility Baseline: Goal status: On going        2.    Pt will improve  Rt shoulder strength to at least 4/5 MMT to improve functional strength Baseline: Goal status: On going  Pt will improve FOTO to at least 48% functional to show improved function Baseline: 45 Goal status: Met 10/09/2021 Pt will  reduce pain to overall less than 2-3/10 with usual activity and work activity. Baseline: 6/10  Goal status: (Met 11/13/2021) Pt will be able to dress herself and fix her hair without more than mild complaints in Rt UE.                  Goal status: Met 11/06/2021  PLAN: PT FREQUENCY: 1-2 times per week    PT DURATION: 4 weeks   PLANNED INTERVENTIONS (unless contraindicated): aquatic PT, Canalith repositioning, cryotherapy, Electrical stimulation, Iontophoresis with 4 mg/ml dexamethasome, Moist heat, traction, Ultrasound, gait training, Therapeutic exercise, balance training, neuromuscular re-education, patient/family education, prosthetic training, manual techniques, passive ROM, dry needling, taping, vasopnuematic  device, vestibular, spinal manipulations, joint manipulations   PLAN FOR NEXT SESSION: Reassess (AROM, strength, update HEP, FOTO is done).  Farley Ly PT, MPT 11/18/21 5:13 PM

## 2021-11-27 ENCOUNTER — Encounter: Payer: Self-pay | Admitting: Rehabilitative and Restorative Service Providers"

## 2021-11-27 ENCOUNTER — Ambulatory Visit (INDEPENDENT_AMBULATORY_CARE_PROVIDER_SITE_OTHER): Payer: Medicare HMO | Admitting: Rehabilitative and Restorative Service Providers"

## 2021-11-27 DIAGNOSIS — R600 Localized edema: Secondary | ICD-10-CM

## 2021-11-27 DIAGNOSIS — M6281 Muscle weakness (generalized): Secondary | ICD-10-CM

## 2021-11-27 DIAGNOSIS — M25511 Pain in right shoulder: Secondary | ICD-10-CM

## 2021-11-27 NOTE — Therapy (Signed)
OUTPATIENT PHYSICAL THERAPY TREATMENT/DISCHARGE NOTE   Patient Name: Joanna Ray MRN: [id number] DOB:1951/03/16, 71 y.o., female Today's Date: 11/27/2021  PHYSICAL THERAPY DISCHARGE SUMMARY  Visits from Start of Care: 24  Current functional level related to goals / functional outcomes: See note   Remaining deficits: See note   Education / Equipment: HEP   Patient agrees to discharge. Patient goals were met. Patient is being discharged due to being pleased with the current functional level.   END OF SESSION:   PT End of Session - 11/27/21 1603     Visit Number 24    Number of Visits 24    Date for PT Re-Evaluation 12/12/21    Authorization Type [employer] MCR    Authorization - Visit Number 24    Authorization - Number of Visits 36    Progress Note Due on Visit 25   last one done #15   PT Start Time 1430    PT Stop Time 1512    PT Time Calculation (min) 42 min    Activity Tolerance Patient tolerated treatment well;No increased pain    Behavior During Therapy WFL for tasks assessed/performed              Past Medical History:  Diagnosis Date   Anxiety    GERD (gastroesophageal reflux disease)    Hyperlipidemia    Hypertension    Urticaria    Past Surgical History:  Procedure Laterality Date   CATARACT EXTRACTION, BILATERAL Bilateral    DILATION AND CURETTAGE OF UTERUS     Patient Active Problem List   Diagnosis Date Noted   Right shoulder pain 08/26/2020   Plasmacytoma ([place]) 06/28/2020   Angioedema 01/21/2019   Vertigo 09/28/2016   Hyperlipidemia 09/25/2011   Hypertension     THERAPY DIAG:  Muscle weakness (generalized)  Right shoulder pain, unspecified chronicity  Acute pain of right shoulder  Localized edema   PCP: Joanna Showers, MD   REFERRING PROVIDER: Janice Coffin, MD   REFERRING DIAG: [phone] (ICD-10-CM) - Pathological fracture of right humerus in neoplastic disease C90.30 (ICD-10-CM) - Solitary plasmacytoma not having  achieved remission Z96.611 (ICD-10-CM) - Presence of right artificial shoulder joint     ONSET DATE: 05/01/21 S/P right proximal humerus replacement   SUBJECTIVE:                                                                                                                                                                                      SUBJECTIVE STATEMENT: Joanna Ray reports comfort and compliance with her HEP.  Sleeping is not a problem.  She is able to reach to about shoulder height and rests her  elbow on the counter when doing her hair.   PAIN:  Are you having pain? Yes: NPRS scale: 0-2/10  Pain location: Rt shoulder Pain description: Sore with reaching, unable to function overhead Aggravating factors: Particularly reaching and overhead function Relieving factors: Rest and exercises   PRECAUTIONS: S/P right proximal humerus replacement 05/01/21, start with PROM 0-90 forward flexion up to 120 by 6 weeks. Abd to 120, ER to 40, IR to 60. Add AROM to same limits. Strengthening to add at 3-4 weeks max lifting 15#"   WEIGHT BEARING RESTRICTIONS None listed however limit weight bearing to Rt UE at this time due to post op status  OCCUPATION: Math and Reading Teacher to adults   PLOF: Independent   PATIENT GOALS : Be able to comb and fix her hair and be able to raise her arm again   OBJECTIVE:    DIAGNOSTIC FINDINGS:  XR in care everywhere section   PATIENT SURVEYS:    FOTO 59 (Goal met) FOTO 45% funcitonal, goal is 48%    UPPER EXTREMITY ROM:     AROM/PROM Right supine 07/15/2021 Right  sitting 08/07/21 Right Standing 08/21/21 Right  Supine Passive 08/26/21 Right Supine Passive 08/28/2021 Right Active in standing 09/10/21 R passive supine 10/03/2021 Right active in standing 11/04/21 R supine 11/27/2021  Shoulder flexion 0/50  10/90 60 140 135 60 120 degrees 90 AROM 130 degrees   Shoulder extension 40/           Shoulder abduction 30/70  50/85 80 110  80 with elbow in 90 deg of  flexiona  80 AROM with arm extended   Shoulder horizontal adduction adduction         35 degrees  30 degrees  Shoulder internal rotation 50/60 at 45 deg abd    75 Shoulder abd 45 degrees 70 degrees at 70 degrees abduction  110 degrees   95 degrees  Shoulder external rotation 10/25 at 45 deg abd  30/ 55/ 70  Shoulder abd to 45 degrees  60 degrees at 70 degrees abduction   100 degrees 45 AROM 100 degrees  (Blank rows = not tested)   UPPER EXTREMITY MMT:   MMT Right 07/15/2021  Right 08/12/21 L/R in pounds 08/28/2021 R in pounds 10/09/2021 R in pounds 11/27/2021  Shoulder flexion 2  2     Shoulder extension 3  3+     Shoulder abduction 2  2     Shoulder adduction         Shoulder internal rotation 3  3+ 40.1/5.3 10 pounds 11.6 pounds  Shoulder external rotation 2  2+ 25.1/<3 (0.0) < 3 pounds 6.9 pounds  Middle trapezius         Lower trapezius         Elbow flexion         Elbow extension         Grip strength (lbs)         (Blank rows = not tested)     PALPATION:  07/15/21: TTP Rt anterior shoulder  TODAY'S TREATMENT 11/27/2021 UBE Push/Pull/Rest :20 intervals for 8 minutes Resistance level 5, try to maintain 70 RPM Pull to chest/Row Blue theraband 20X Theraband ER 2 sets of 10 (1 set concentric/eccentric and 1 set walk out trying to maintain isometric hold) Yellow Theraband IR Yellow 10X Supine arm raises with pillow under elbow 2 sets of 10 active full range 1# and 2# Side-lie ER 10X slow eccentrics UE Ranger flexion only 10X slow eccentrics  NeuroMuscular re-education: Body blade forward/back; protract/retract; reach for protract/retract (Elbow bent above shoulder) 3X 10 seconds each (+ rest)   11/18/2021 UBE Push/Pull/Rest :20 intervals for 8 minutes Resistance level 5, try to maintain 70 RPM Pull to chest/Row Blue theraband 20X Theraband ER 2 sets of 10 (1 set concentric/eccentric and 1 set walk out trying to maintain isometric hold) Yellow Theraband IR Yellow 10X Supine arm  raises with pillow under elbow 2 sets of 10 active full range Side-lie ER 10X slow eccentrics UE Ranger flexion only 10X slow eccentrics Standing AAROM flex/extend rolling red ball up/down wall 10X  NeuroMuscular re-education: Body blade forward/back; protract/retract; reach for protract/retract and javelin hold (Elbow bent above shoulder) 3X 10 seconds each (+ rest)  Vasopnuematic device X 10 min to right shoulder, medium compression, 34*   11/13/2021  UBE Push/Pull/Rest :20 intervals for 8 minutes Resistance level 5, try to maintain 70 RPM Pull to chest/Row Blue theraband 20X Theraband ER 2 sets of 10 (1 set concentric/eccentric and 1 set walk out trying to maintain isometric hold) Yellow Theraband IR Yellow 10X Supine arm raises with pillow under elbow 2 sets of 10 active full (range Side-lie ER 10X slow eccentrics UE Ranger flexion and scaption 10X each  Standing AAROM flex/extend rolling red ball up/down wall 10X  NeuroMuscular re-education: Body blade forward/back; IR/ER; abduction, protract/retract; reach for protract/retract and javelin hold (Elbow bent above shoulder) 3X 10 seconds each (+ rest)  Vasopnuematic device X 10 min to right shoulder, medium compression, 34*   PATIENT EDUCATION: 08/28/21 Education details: Reviewed reassessment findings and updated HEP with strength emphasis Person educated: Patient Education method: Explanation, Demonstration, Verbal cues, and Handouts Education comprehension: verbalized understanding, returned demonstration, and needs further education     HOME EXERCISE PROGRAM: Access Code: [email or web address] URL: [email or web address] Date: 11/27/2021 Prepared by: Vista Mink  Exercises - Standing Scapular Retraction  - 5 x daily - 7 x weekly - 1 sets - 5 reps - 5 second hold - Standing Row with Anchored Resistance Band with PLB  - 1 x daily - 3-4 x weekly - 1 sets - 20 reps - 3 hold - Shoulder External Rotation with Anchored  Resistance  - 1 x daily - 3-4 x weekly - 2 sets - 10 reps - 3 hold - Shoulder Internal Rotation with Resistance  - 1 x daily - 3-4 x weekly - 1 sets - 10 reps - 3 hold - Sidelying Shoulder External Rotation Dumbbell  - 1 x daily - 3-4 x weekly - 2 sets - 10 reps - 3 hold - Supine Scapular Protraction in Flexion with Dumbbells  - 1 x daily - 3-4 x weekly - 2-3 sets - 10 reps - 3 seconds hold   ASSESSMENT:   CLINICAL IMPRESSION: Madeline is pleased with her PT progress.  She is still limited with reaching and overhead function.  AROM and strength will be reassessed next visit along with focusing in her long-term HEP.  Possible transfer into independent PT as FOTO had little change since her last assessment.   OBJECTIVE IMPAIRMENTS: decreased activity tolerance, decreased endurance, decreased mobility, decreased ROM, decreased strength, impaired flexibility, impaired LE use, postural dysfunction, and pain.   ACTIVITY LIMITATIONS:  lifting, carry,  cleaning, dressing, bathing, driving, and or occupation   REHAB POTENTIAL: Good   CLINICAL DECISION MAKING: stable/uncomplicated   EVALUATION COMPLEXITY: Low     GOALS: Short term PT Goals Target date: 08/12/2021 Pt will be I and compliant with HEP. Baseline:  Goal status: MET Pt will improve Rt shoulder abduction and flexion PROM >90 deg Baseline: Goal status: MET   Long term PT goals Target date: 12/07/2021 Pt will improve Rt shoulder ROM to [facility] to improve functional mobility Baseline: Goal status:Met 11/27/2021        2.    Pt will improve  Rt shoulder strength to at least 4/5 MMT to improve functional strength Baseline: Goal status: Met 11/27/2021 Pt will improve FOTO to at least 48% functional to show improved function Baseline: 45 Goal status: Met 10/09/2021 Pt will reduce pain to overall less than 2-3/10 with usual activity and work activity. Baseline: 6/10  Goal status: (Met 11/13/2021) Pt will be able to dress herself and fix her hair  without more than mild complaints in Rt UE.                  Goal status: Met 11/06/2021  PLAN: PT FREQUENCY: DC   PT DURATION: DC   PLANNED INTERVENTIONS (unless contraindicated): aquatic PT, Canalith repositioning, cryotherapy, Electrical stimulation, Iontophoresis with 4 mg/ml dexamethasome, Moist heat, traction, Ultrasound, gait training, Therapeutic exercise, balance training, neuromuscular re-education, patient/family education, prosthetic training, manual techniques, passive ROM, dry needling, taping, vasopnuematic device, vestibular, spinal manipulations, joint manipulations   PLAN FOR NEXT SESSION: DC  Farley Ly PT, MPT 11/27/21 4:10 PM

## 2021-12-22 ENCOUNTER — Other Ambulatory Visit: Payer: Medicare HMO

## 2021-12-22 DIAGNOSIS — C9030 Solitary plasmacytoma not having achieved remission: Secondary | ICD-10-CM | POA: Diagnosis not present

## 2021-12-22 DIAGNOSIS — E785 Hyperlipidemia, unspecified: Secondary | ICD-10-CM | POA: Diagnosis not present

## 2021-12-22 DIAGNOSIS — I10 Essential (primary) hypertension: Secondary | ICD-10-CM

## 2021-12-22 DIAGNOSIS — R5383 Other fatigue: Secondary | ICD-10-CM

## 2021-12-23 LAB — CBC WITH DIFFERENTIAL/PLATELET
Absolute Monocytes: 506 cells/uL (ref 200–950)
Basophils Absolute: 79 cells/uL (ref 0–200)
Basophils Relative: 1.8 %
Eosinophils Absolute: 141 cells/uL (ref 15–500)
Eosinophils Relative: 3.2 %
HCT: 36 % (ref 35.0–45.0)
Hemoglobin: 12 g/dL (ref 11.7–15.5)
Lymphs Abs: 1619 cells/uL (ref 850–3900)
MCH: 30.9 pg (ref 27.0–33.0)
MCHC: 33.3 g/dL (ref 32.0–36.0)
MCV: 92.8 fL (ref 80.0–100.0)
MPV: 11.9 fL (ref 7.5–12.5)
Monocytes Relative: 11.5 %
Neutro Abs: 2055 cells/uL (ref 1500–7800)
Neutrophils Relative %: 46.7 %
Platelets: 221 10*3/uL (ref 140–400)
RBC: 3.88 10*6/uL (ref 3.80–5.10)
RDW: 13.3 % (ref 11.0–15.0)
Total Lymphocyte: 36.8 %
WBC: 4.4 10*3/uL (ref 3.8–10.8)

## 2021-12-23 LAB — COMPLETE METABOLIC PANEL WITH GFR
AG Ratio: 1.2 (calc) (ref 1.0–2.5)
ALT: 11 U/L (ref 6–29)
AST: 16 U/L (ref 10–35)
Albumin: 4.3 g/dL (ref 3.6–5.1)
Alkaline phosphatase (APISO): 43 U/L (ref 37–153)
BUN/Creatinine Ratio: 20 (calc) (ref 6–22)
BUN: 21 mg/dL (ref 7–25)
CO2: 29 mmol/L (ref 20–32)
Calcium: 9.8 mg/dL (ref 8.6–10.4)
Chloride: 106 mmol/L (ref 98–110)
Creat: 1.07 mg/dL — ABNORMAL HIGH (ref 0.60–1.00)
Globulin: 3.7 g/dL (calc) (ref 1.9–3.7)
Glucose, Bld: 95 mg/dL (ref 65–99)
Potassium: 4.7 mmol/L (ref 3.5–5.3)
Sodium: 143 mmol/L (ref 135–146)
Total Bilirubin: 0.6 mg/dL (ref 0.2–1.2)
Total Protein: 8 g/dL (ref 6.1–8.1)
eGFR: 56 mL/min/{1.73_m2} — ABNORMAL LOW (ref >=60)

## 2021-12-23 LAB — LIPID PANEL
Cholesterol: 156 mg/dL (ref <200)
HDL: 50 mg/dL (ref >=50)
LDL Cholesterol (Calc): 89 mg/dL (calc)
Non-HDL Cholesterol (Calc): 106 mg/dL (calc) (ref <130)
Total CHOL/HDL Ratio: 3.1 (calc) (ref <5.0)
Triglycerides: 83 mg/dL (ref <150)

## 2021-12-23 LAB — TSH: TSH: 1.35 mIU/L (ref 0.40–4.50)

## 2022-01-07 ENCOUNTER — Other Ambulatory Visit: Payer: Self-pay

## 2022-01-07 ENCOUNTER — Inpatient Hospital Stay: Payer: Medicare HMO | Attending: Internal Medicine

## 2022-01-07 DIAGNOSIS — Z7982 Long term (current) use of aspirin: Secondary | ICD-10-CM | POA: Diagnosis not present

## 2022-01-07 DIAGNOSIS — C9030 Solitary plasmacytoma not having achieved remission: Secondary | ICD-10-CM | POA: Insufficient documentation

## 2022-01-07 DIAGNOSIS — E785 Hyperlipidemia, unspecified: Secondary | ICD-10-CM | POA: Diagnosis not present

## 2022-01-07 DIAGNOSIS — K219 Gastro-esophageal reflux disease without esophagitis: Secondary | ICD-10-CM | POA: Diagnosis not present

## 2022-01-07 DIAGNOSIS — Z79899 Other long term (current) drug therapy: Secondary | ICD-10-CM | POA: Insufficient documentation

## 2022-01-07 DIAGNOSIS — I10 Essential (primary) hypertension: Secondary | ICD-10-CM | POA: Insufficient documentation

## 2022-01-07 LAB — CMP (CANCER CENTER ONLY)
ALT: 20 U/L (ref 0–44)
AST: 22 U/L (ref 15–41)
Albumin: 4.3 g/dL (ref 3.5–5.0)
Alkaline Phosphatase: 45 U/L (ref 38–126)
Anion gap: 5 (ref 5–15)
BUN: 19 mg/dL (ref 8–23)
CO2: 32 mmol/L (ref 22–32)
Calcium: 9.7 mg/dL (ref 8.9–10.3)
Chloride: 103 mmol/L (ref 98–111)
Creatinine: 1.08 mg/dL — ABNORMAL HIGH (ref 0.44–1.00)
GFR, Estimated: 55 mL/min — ABNORMAL LOW (ref >60)
Glucose, Bld: 103 mg/dL — ABNORMAL HIGH (ref 70–99)
Potassium: 3.8 mmol/L (ref 3.5–5.1)
Sodium: 140 mmol/L (ref 135–145)
Total Bilirubin: 0.7 mg/dL (ref 0.3–1.2)
Total Protein: 7.9 g/dL (ref 6.5–8.1)

## 2022-01-07 LAB — LACTATE DEHYDROGENASE: LDH: 149 U/L (ref 98–192)

## 2022-01-07 LAB — CBC WITH DIFFERENTIAL (CANCER CENTER ONLY)
Abs Immature Granulocytes: 0.05 10*3/uL (ref 0.00–0.07)
Basophils Absolute: 0.1 10*3/uL (ref 0.0–0.1)
Basophils Relative: 2 %
Eosinophils Absolute: 0.1 10*3/uL (ref 0.0–0.5)
Eosinophils Relative: 2 %
HCT: 36 % (ref 36.0–46.0)
Hemoglobin: 12 g/dL (ref 12.0–15.0)
Immature Granulocytes: 1 %
Lymphocytes Relative: 33 %
Lymphs Abs: 1.6 10*3/uL (ref 0.7–4.0)
MCH: 31.1 pg (ref 26.0–34.0)
MCHC: 33.3 g/dL (ref 30.0–36.0)
MCV: 93.3 fL (ref 80.0–100.0)
Monocytes Absolute: 0.6 10*3/uL (ref 0.1–1.0)
Monocytes Relative: 12 %
Neutro Abs: 2.5 10*3/uL (ref 1.7–7.7)
Neutrophils Relative %: 50 %
Platelet Count: 217 10*3/uL (ref 150–400)
RBC: 3.86 MIL/uL — ABNORMAL LOW (ref 3.87–5.11)
RDW: 13.4 % (ref 11.5–15.5)
WBC Count: 4.9 10*3/uL (ref 4.0–10.5)
nRBC: 0 % (ref 0.0–0.2)

## 2022-01-08 ENCOUNTER — Ambulatory Visit (INDEPENDENT_AMBULATORY_CARE_PROVIDER_SITE_OTHER): Payer: Medicare HMO | Admitting: Internal Medicine

## 2022-01-08 ENCOUNTER — Encounter: Payer: Self-pay | Admitting: Internal Medicine

## 2022-01-08 ENCOUNTER — Other Ambulatory Visit: Payer: Self-pay | Admitting: Internal Medicine

## 2022-01-08 VITALS — BP 130/70 | HR 61 | Temp 98.1°F | Ht 65.0 in | Wt 199.4 lb

## 2022-01-08 DIAGNOSIS — Z6833 Body mass index (BMI) 33.0-33.9, adult: Secondary | ICD-10-CM

## 2022-01-08 DIAGNOSIS — C9031 Solitary plasmacytoma in remission: Secondary | ICD-10-CM | POA: Diagnosis not present

## 2022-01-08 DIAGNOSIS — Z1231 Encounter for screening mammogram for malignant neoplasm of breast: Secondary | ICD-10-CM

## 2022-01-08 DIAGNOSIS — F32A Depression, unspecified: Secondary | ICD-10-CM

## 2022-01-08 DIAGNOSIS — I10 Essential (primary) hypertension: Secondary | ICD-10-CM

## 2022-01-08 DIAGNOSIS — R82998 Other abnormal findings in urine: Secondary | ICD-10-CM

## 2022-01-08 DIAGNOSIS — Z0000 Encounter for general adult medical examination without abnormal findings: Secondary | ICD-10-CM

## 2022-01-08 DIAGNOSIS — F419 Anxiety disorder, unspecified: Secondary | ICD-10-CM

## 2022-01-08 DIAGNOSIS — E7800 Pure hypercholesterolemia, unspecified: Secondary | ICD-10-CM | POA: Diagnosis not present

## 2022-01-08 LAB — POCT URINALYSIS DIPSTICK
Bilirubin, UA: NEGATIVE
Blood, UA: NEGATIVE
Glucose, UA: NEGATIVE
Ketones, UA: NEGATIVE
Nitrite, UA: NEGATIVE
Protein, UA: NEGATIVE
Spec Grav, UA: 1.015 (ref 1.010–1.025)
Urobilinogen, UA: 0.2 E.U./dL
pH, UA: 5 (ref 5.0–8.0)

## 2022-01-08 LAB — IGG, IGA, IGM
IgA: 102 mg/dL (ref 87–352)
IgG (Immunoglobin G), Serum: 2287 mg/dL — ABNORMAL HIGH (ref 586–1602)
IgM (Immunoglobulin M), Srm: 64 mg/dL (ref 26–217)

## 2022-01-08 LAB — KAPPA/LAMBDA LIGHT CHAINS
Kappa free light chain: 120 mg/L — ABNORMAL HIGH (ref 3.3–19.4)
Kappa, lambda light chain ratio: 12.5 — ABNORMAL HIGH (ref 0.26–1.65)
Lambda free light chains: 9.6 mg/L (ref 5.7–26.3)

## 2022-01-08 NOTE — Progress Notes (Signed)
Annual Wellness Visit     Patient: Joanna Ray, Female    DOB: 1951-01-06, 71 y.o.   MRN: [id number] Visit Date: 01/08/2022  Chief Complaint  Patient presents with   Medicare Wellness   Subjective    Joanna Ray is a 71 y.o. female who presents today for her Annual Wellness Visit.  HPI She is also here for health maintenance exma and evaluation of medical issues.  She has finished PT for her arm. Will see Dr. Earlie Server next week for 6 month follow up. Reminded about colonoscopy. Referral was placed previously. Vaccines discussed.  She presented here in late October with acute lower respiratory infection and mentions of right arm and shoulder pain.  Was thought to have an impingement syndrome.  This was mentioned at the end of that visit and recommended orthopedic evaluation.  She saw Dr. Junius Roads and had a subacromial injection and wrist were referred to physical therapy.  Symptoms persisted and in early February she had MRI of right shoulder showing moderate rotator cuff tendinopathy.  Ganglion cyst was noted inferior to the supraspinatus muscle.  However, the humerus had a 5.3 cm expansile lesion with some edema surrounding the lesion.  It was biopsied in early March and was found to be a plasmacytoma.  She was referred to Dr. Earlie Server and had evaluation for multiple myeloma.  Had kappa light chains present on myeloma testing.  She received radiation therapy to the right humerus plasmacytoma.  She had 6% plasma cells in bone marrow biopsy and aspirate.  She had tumor resection from proximal humerus with reconstruction at [facility] January 2023.  She sees Dr. Earlie Server.  He says her protein studies are still suspicious for underlying MGUS/multiple myeloma the findings are not enough to confirm the diagnosis of multiple myeloma at this time.  She did undergo radiotherapy to the plasmacytoma.  Skeletal survey showed no other lytic lesions.  She has a history  of hypertension.  History of anxiety and depression treated with Lexapro and Xanax.  History of allergic rhinitis.  History of urticaria treated by Dr. Nelva Bush.  Social history: Husband is retired and has congestive heart failure.  Patient used to smoke but has not smoked in over 20 years.  She has adult children and she teaches at [employer].  No alcohol consumption.  No known drug allergies.  No history of operations prior to diagnosis of her plasmacytoma.  Family history: Father died at age 57 of a stroke.  Mother died at age 71 with history of stroke in 2010.  1 brother with history of hypertension and MI.  1 sister with history of diabetes.  1 sister with history of hypertension and another sister with history of hypertension.  Son with history of hypertension.  She lost her son in the summer 2019 and an automobile accident.        Review of Systems see above-getting range of motion back in her arm   Objective    Vitals: BP 130/70   Pulse 61   Temp 98.1 F (36.7 C) (Tympanic)   Ht 5' 5"  (1.651 m)   Wt 199 lb 6.4 oz (90.4 kg)   SpO2 98%   BMI 33.18 kg/m   Physical Exam Skin warm and dry.  No cervical adenopathy or thyromegaly.  Improving range of motion in right arm.  Neck is supple.  No carotid bruits.  Chest clear.  Cardiac exam: Regular rate and rhythm.  Abdomen is soft nondistended without hepatosplenomegaly masses or tenderness.  No lower extremity pitting edema.  Brief neurological exam is intact without gross focal deficits.  Affect and judgment are normal.     Most recent functional status assessment:    01/08/2022    2:08 PM  In your present state of health, do you have any difficulty performing the following activities:  Hearing? 0  Vision? 0  Walking or climbing stairs? 0  Dressing or bathing? 0  Doing errands, shopping? 0  Preparing Food and eating ? N  Using the Toilet? N  In the past six months, have you accidently leaked urine?  N  Do you have problems with loss of bowel control? N  Managing your Medications? N  Managing your Finances? N  Housekeeping or managing your Housekeeping? N   Most recent fall risk assessment:    01/08/2022    2:07 PM  Fall Risk   Falls in the past year? 0  Number falls in past yr: 0  Injury with Fall? 0  Risk for fall due to : No Fall Risks  Follow up Falls evaluation completed    Most recent depression screenings:    01/08/2022    2:08 PM 09/05/2020   10:05 AM  PHQ 2/9 Scores  PHQ - 2 Score 0 0   Most recent cognitive screening:    01/08/2022    2:09 PM  6CIT Screen  What Year? 0 points  What month? 0 points  What time? 0 points  Count back from 20 0 points  Months in reverse 0 points  Repeat phrase 0 points  Total Score 0 points       Assessment & Plan  Plasmacytoma of right upper extremity status postsurgery at [facility] and radiation treatment.  Is under the care of Dr. Earlie Server.  He is following her closely to see if she develops multiple myeloma.  Essential hypertension stable on current regimen valsartan HCTZ  Hyperlipidemia treated with Crestor 20 mg daily.  History of allergic rhinitis treated with Allegra  History of bilateral cataract extractions  History of mild aortic regurgitation followed by [facility] cardiology and stable  Plan: Discussed vaccines and she will consider them depending on what Oncology recommends we would certainly recommend flu vaccine.  Her tetanus immunization is up-to-date.  Would suggest a COVID booster.  She will continue with current medications for hyperlipidemia and hypertension.  Blood pressure is stable.  Lipid panel is excellent on statin medication.  TSH is normal.  Creatinine mildly elevated at 1.97.  Liver functions are normal   Plan: I am suggesting she return in 6 months for evaluation of hypertension and hyperlipidemia.  She will continue close follow-up with Dr. Earlie Server and [facility].  Consider colon cancer screening.       Annual wellness visit done today including the all of the following: Reviewed patient's Family Medical History Reviewed and updated list of patient's medical providers Assessment of cognitive impairment was done Assessed patient's functional ability Established a written schedule for health screening [place] Completed and Reviewed  Discussed health benefits of physical activity, and encouraged her to engage in regular exercise appropriate for her age and condition.         IElby Showers, MD, have reviewed all documentation for this visit. The documentation on 01/24/22 for the exam, diagnosis, procedures, and orders are all accurate and complete.   LaVon Barron Alvine, CMA

## 2022-01-09 LAB — URINE CULTURE
MICRO NUMBER:: 13918106
Result:: NO GROWTH
SPECIMEN QUALITY:: ADEQUATE

## 2022-01-09 LAB — HOUSE ACCOUNT TRACKING

## 2022-01-09 LAB — BETA 2 MICROGLOBULIN, SERUM: Beta-2 Microglobulin: 2.1 mg/L (ref 0.6–2.4)

## 2022-01-14 ENCOUNTER — Other Ambulatory Visit: Payer: Medicare HMO

## 2022-01-14 ENCOUNTER — Inpatient Hospital Stay (HOSPITAL_BASED_OUTPATIENT_CLINIC_OR_DEPARTMENT_OTHER): Payer: Medicare HMO | Admitting: Internal Medicine

## 2022-01-14 ENCOUNTER — Other Ambulatory Visit: Payer: Self-pay

## 2022-01-14 VITALS — BP 152/77 | HR 63 | Temp 97.8°F | Resp 15 | Wt 199.7 lb

## 2022-01-14 DIAGNOSIS — Z79899 Other long term (current) drug therapy: Secondary | ICD-10-CM | POA: Diagnosis not present

## 2022-01-14 DIAGNOSIS — Z7982 Long term (current) use of aspirin: Secondary | ICD-10-CM | POA: Diagnosis not present

## 2022-01-14 DIAGNOSIS — C9020 Extramedullary plasmacytoma not having achieved remission: Secondary | ICD-10-CM | POA: Diagnosis not present

## 2022-01-14 DIAGNOSIS — I10 Essential (primary) hypertension: Secondary | ICD-10-CM | POA: Diagnosis not present

## 2022-01-14 DIAGNOSIS — E785 Hyperlipidemia, unspecified: Secondary | ICD-10-CM | POA: Diagnosis not present

## 2022-01-14 DIAGNOSIS — K219 Gastro-esophageal reflux disease without esophagitis: Secondary | ICD-10-CM | POA: Diagnosis not present

## 2022-01-14 DIAGNOSIS — C9030 Solitary plasmacytoma not having achieved remission: Secondary | ICD-10-CM | POA: Diagnosis not present

## 2022-01-14 NOTE — Progress Notes (Signed)
[place] Telephone:([phone]) [phone]   Fax:(336) [phone]  OFFICE PROGRESS NOTE  [name], MD [address] 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  PRIOR THERAPY:  1) Palliative radiotherapy to the plasmacytoma of the proximal right humerus under the care of Dr. Sondra Come. 2) Status post tumor resection from the proximal humerus with reconstruction under the care of Dr. Mylo Red at [facility] on May 01, 2021.  CURRENT THERAPY: Observation  INTERVAL HISTORY: Joanna Ray 71 y.o. female returns to the clinic today for follow-up visit accompanied by her sister.  The patient is feeling fine today with no concerning complaints.  She has no fatigue or weakness.  She has no chest pain, shortness of breath, cough or hemoptysis.  She has no nausea, vomiting, diarrhea or constipation.  She has no headache or visual changes.  She denied having any recent fever or chills.  She intentionally lost few pounds since her last visit.  She is here today for evaluation with repeat myeloma panel.  MEDICAL HISTORY: Past Medical History:  Diagnosis Date   Anxiety    GERD (gastroesophageal reflux disease)    Hyperlipidemia    Hypertension    Urticaria     ALLERGIES:  is allergic to shellfish allergy.  MEDICATIONS:  Current Outpatient Medications  Medication Sig Dispense Refill   ALPRAZolam (XANAX) 0.5 MG tablet Take 1 tablet by mouth twice daily as needed for anxiety 60 tablet 5   aspirin 81 MG tablet Take 81 mg by mouth daily.     cholecalciferol (VITAMIN D) 1000 UNITS tablet Take 1,000 Units by mouth daily.     EPINEPHrine 0.3 mg/0.3 mL IJ SOAJ injection Inject 0.3 mLs (0.3 mg total) into the muscle as needed for anaphylaxis. 1 each 5   fexofenadine (ALLEGRA) 180 MG tablet Take 1 tablet (180 mg total) by mouth daily. 90 tablet 1   hydrocortisone  valerate cream (WESTCORT) 0.2 % 1 app     oxyCODONE-acetaminophen (PERCOCET/ROXICET) 5-325 MG tablet Take 1 tablet by mouth every 8 (eight) hours as needed for severe pain. (Patient not taking: Reported on 01/08/2022) 10 tablet 0   rosuvastatin (CRESTOR) 20 MG tablet Take 1/2 (one-half) tablet by mouth once daily 45 tablet prn   traMADol (ULTRAM) 50 MG tablet Take 1 tablet (50 mg total) by mouth every 6 (six) hours as needed. 30 tablet 0   valsartan-hydrochlorothiazide (DIOVAN-HCT) 160-25 MG tablet Take 1 tablet by mouth daily. 90 tablet 1   vitamin E 100 UNIT capsule Take 100 Units by mouth daily.     No current facility-administered medications for this visit.    SURGICAL HISTORY:  Past Surgical History:  Procedure Laterality Date   CATARACT EXTRACTION, BILATERAL Bilateral    DILATION AND CURETTAGE OF UTERUS      REVIEW OF SYSTEMS:  A comprehensive review of systems was negative.   PHYSICAL EXAMINATION: General appearance: alert, cooperative, and no distress Head: Normocephalic, without obvious abnormality, atraumatic Neck: no adenopathy, no JVD, supple, symmetrical, trachea midline, and thyroid not enlarged, symmetric, no tenderness/mass/nodules Lymph nodes: Cervical, supraclavicular, and axillary nodes normal. Resp: clear to auscultation bilaterally Back: symmetric, no curvature. ROM normal. No CVA tenderness. Cardio: regular rate and rhythm, S1, S2 normal, no murmur, click, rub or gallop GI: soft, non-tender; bowel sounds normal; no masses,  no organomegaly Extremities: extremities normal, atraumatic, no cyanosis or edema and No  edema  ECOG PERFORMANCE STATUS: 1 - Symptomatic but completely ambulatory  Blood pressure (!) 152/77, pulse 63, temperature 97.8 F (36.6 C), temperature source Oral, resp. rate 15, weight 199 lb 11.2 oz (90.6 kg), SpO2 100 %.  LABORATORY DATA: Lab Results  Component Value Date   WBC 4.9 01/07/2022   HGB 12.0 01/07/2022   HCT 36.0 01/07/2022   MCV  93.3 01/07/2022   PLT 217 01/07/2022      Chemistry      Component Value Date/Time   NA 140 01/07/2022 1011   NA 145 (H) 12/28/2018 1436   K 3.8 01/07/2022 1011   CL 103 01/07/2022 1011   CO2 32 01/07/2022 1011   BUN 19 01/07/2022 1011   BUN 15 12/28/2018 1436   CREATININE 1.08 (H) 01/07/2022 1011   CREATININE 1.07 (H) 12/22/2021 1006      Component Value Date/Time   CALCIUM 9.7 01/07/2022 1011   ALKPHOS 45 01/07/2022 1011   AST 22 01/07/2022 1011   ALT 20 01/07/2022 1011   BILITOT 0.7 01/07/2022 1011       RADIOGRAPHIC STUDIES: No results found.  ASSESSMENT AND PLAN: This is a very pleasant 71 years old African-American female with plasmacytoma of the proximal right humerus diagnosed in February 2022.  The patient had extensive work-up for multiple myeloma including a bone marrow biopsy and aspirate that showed only 6% plasma cells.  Her protein studies still suspicious for underlying MGUS/multiple myeloma but the findings are not enough to call it multiple myeloma at this point. The patient underwent radiotherapy to the plasmacytoma of the proximal right humerus and tolerated the procedure fairly well.  She is currently on observation.  The skeletal bone survey showed no other lytic bone lesion except the proximal right humerus. The patient was treated with palliative radiotherapy in the past but recent skeletal bone survey showed progression of the lytic lesion in the proximal humeral diaphysis with pathologic fracture.  She underwent surgical resection of the tumor with reconstruction under the care of Dr. Mylo Red at [facility] on May 01, 2021.   The patient is currently on observation and she is feeling fine with no concerning complaints. She had repeat myeloma panel performed recently.  I discussed the lab result with the patient and her sister.  Her myeloma panel showed no concerning findings for progression. I recommended for her to continue on observation with  repeat myeloma panel in 6 months. The patient was advised to call immediately if she has any other concerning symptoms in the interval. The patient voices understanding of current disease status and treatment options and is in agreement with the current care plan.  All questions were answered. The patient knows to call the clinic with any problems, questions or concerns. We can certainly see the patient much sooner if necessary.   Disclaimer: This note was dictated with voice recognition software. Similar sounding words can inadvertently be transcribed and may not be corrected upon review.

## 2022-01-24 NOTE — Patient Instructions (Addendum)
It was a pleasure to see you today.  I am glad you are making progress with physical therapy regarding plasmacytoma in your arm.  Vaccines discussed.  Mammogram ordered.  Consider colon cancer screening in the near future.  Return in 6 months.  Continue statin medication to lower cholesterol and antihypertensive medication.  Consider COVID-vaccine.

## 2022-02-12 ENCOUNTER — Other Ambulatory Visit: Payer: Self-pay | Admitting: Internal Medicine

## 2022-02-12 DIAGNOSIS — F4321 Adjustment disorder with depressed mood: Secondary | ICD-10-CM

## 2022-02-12 DIAGNOSIS — H8110 Benign paroxysmal vertigo, unspecified ear: Secondary | ICD-10-CM

## 2022-02-12 MED ORDER — ALPRAZOLAM 0.5 MG PO TABS: 0.5000 mg | ORAL_TABLET | Freq: Two times a day (BID) | ORAL | 1 refills | Status: DC | PRN

## 2022-02-12 MED ORDER — ROSUVASTATIN CALCIUM 20 MG PO TABS: ORAL_TABLET | ORAL | 99 refills | Status: DC

## 2022-02-12 NOTE — Telephone Encounter (Signed)
Received Fax RX request from  [place], [place] (Ph: [phone]) [phone] fax  Medication -  ALPRAZolam Duanne Moron) 0.5 MG tablet rosuvastatin (CRESTOR) 20 MG tablet  Last Refill -  Last OV - 01/08/2022  Last CPE - 01/08/2022  Next Appointment - 07/14/2022

## 2022-03-04 ENCOUNTER — Encounter: Payer: Self-pay | Admitting: Gastroenterology

## 2022-03-04 ENCOUNTER — Ambulatory Visit
Admission: RE | Admit: 2022-03-04 | Discharge: 2022-03-04 | Disposition: A | Discharge status: Home / Self Care | Payer: Medicare HMO | Source: Ambulatory Visit | Attending: Internal Medicine | Admitting: Internal Medicine

## 2022-03-04 DIAGNOSIS — Z1231 Encounter for screening mammogram for malignant neoplasm of breast: Secondary | ICD-10-CM | POA: Diagnosis not present

## 2022-03-09 ENCOUNTER — Other Ambulatory Visit: Payer: Self-pay | Admitting: Internal Medicine

## 2022-04-06 ENCOUNTER — Other Ambulatory Visit: Payer: Self-pay

## 2022-04-06 ENCOUNTER — Ambulatory Visit (AMBULATORY_SURGERY_CENTER): Payer: Medicare HMO

## 2022-04-06 VITALS — Ht 65.0 in | Wt 201.0 lb

## 2022-04-06 DIAGNOSIS — Z1211 Encounter for screening for malignant neoplasm of colon: Secondary | ICD-10-CM

## 2022-04-06 MED ORDER — NA SULFATE-K SULFATE-MG SULF 17.5-3.13-1.6 GM/177ML PO SOLN: 1.0000 | Freq: Once | ORAL | 0 refills | Status: AC

## 2022-04-06 NOTE — Progress Notes (Signed)
Denies allergies to eggs or soy products. Denies complication of anesthesia or sedation. Denies use of weight loss medication. Denies use of O2.   Emmi instructions given for colonoscopy.  

## 2022-05-05 ENCOUNTER — Encounter: Payer: Self-pay | Admitting: Gastroenterology

## 2022-05-06 ENCOUNTER — Ambulatory Visit (AMBULATORY_SURGERY_CENTER): Payer: Medicare HMO | Admitting: Gastroenterology

## 2022-05-06 ENCOUNTER — Encounter: Payer: Self-pay | Admitting: Gastroenterology

## 2022-05-06 VITALS — BP 112/62 | HR 53 | Temp 97.7°F | Resp 14 | Ht 65.0 in | Wt 201.0 lb

## 2022-05-06 DIAGNOSIS — Z1211 Encounter for screening for malignant neoplasm of colon: Secondary | ICD-10-CM

## 2022-05-06 DIAGNOSIS — F419 Anxiety disorder, unspecified: Secondary | ICD-10-CM | POA: Diagnosis not present

## 2022-05-06 DIAGNOSIS — I10 Essential (primary) hypertension: Secondary | ICD-10-CM | POA: Diagnosis not present

## 2022-05-06 DIAGNOSIS — E785 Hyperlipidemia, unspecified: Secondary | ICD-10-CM | POA: Diagnosis not present

## 2022-05-06 MED ORDER — SODIUM CHLORIDE 0.9 % IV SOLN: 500.0000 mL | Freq: Once | INTRAVENOUS | Status: DC

## 2022-05-06 NOTE — Patient Instructions (Signed)
Handout on hemorrhoids, hemorrhoidal banding, and diverticulosis given to patient. Call office to schedule next available appointment for hemorrhoidal banding with Dr. Silverio Decamp  Repeat colonoscopy for surveillance not recommended due to age  Resume previous diet and continue present medications  YOU HAD AN ENDOSCOPIC PROCEDURE TODAY AT [place]:   Refer to the procedure report that was given to you for any specific questions about what was found during the examination.  If the procedure report does not answer your questions, please call your gastroenterologist to clarify.  If you requested that your care partner not be given the details of your procedure findings, then the procedure report has been included in a sealed envelope for you to review at your convenience later.  YOU SHOULD EXPECT: Some feelings of bloating in the abdomen. Passage of more gas than usual.  Walking can help get rid of the air that was put into your GI tract during the procedure and reduce the bloating. If you had a lower endoscopy (such as a colonoscopy or flexible sigmoidoscopy) you may notice spotting of blood in your stool or on the toilet paper. If you underwent a bowel prep for your procedure, you may not have a normal bowel movement for a few days.  Please Note:  You might notice some irritation and congestion in your nose or some drainage.  This is from the oxygen used during your procedure.  There is no need for concern and it should clear up in a day or so.  SYMPTOMS TO REPORT IMMEDIATELY:  Following lower endoscopy (colonoscopy or flexible sigmoidoscopy):  Excessive amounts of blood in the stool  Significant tenderness or worsening of abdominal pains  Swelling of the abdomen that is new, acute  Fever of 100F or higher  For urgent or emergent issues, a gastroenterologist can be reached at any hour by calling [phone]. Do not use MyChart messaging for urgent concerns.    DIET:  We do  recommend a small meal at first, but then you may proceed to your regular diet.  Drink plenty of fluids but you should avoid alcoholic beverages for 24 hours.  ACTIVITY:  You should plan to take it easy for the rest of today and you should NOT DRIVE or use heavy machinery until tomorrow (because of the sedation medicines used during the test).    FOLLOW UP: Our staff will call the number listed on your records the next business day following your procedure.  We will call around 7:15- 8:00 am to check on you and address any questions or concerns that you may have regarding the information given to you following your procedure. If we do not reach you, we will leave a message.     If any biopsies were taken you will be contacted by phone or by letter within the next 1-3 weeks.  Please call us at [phone] if you have not heard about the biopsies in 3 weeks.    SIGNATURES/CONFIDENTIALITY: You and/or your care partner have signed paperwork which will be entered into your electronic medical record.  These signatures attest to the fact that that the information above on your After Visit Summary has been reviewed and is understood.  Full responsibility of the confidentiality of this discharge information lies with you and/or your care-partner.

## 2022-05-06 NOTE — Progress Notes (Unsigned)
[facility] History and Physical   Primary Care Physician:  [name], MD   Reason for Procedure:  Colorectal cancer screening  Plan:    Screening colonoscopy with possible interventions as needed     HPI: KATALEAH BEJAR is a very pleasant 72 y.o. female here for screening colonoscopy. Denies any nausea, vomiting, abdominal pain, melena or bright red blood per rectum  The risks and benefits as well as alternatives of endoscopic procedure(s) have been discussed and reviewed. All questions answered. The patient agrees to proceed.    Past Medical History:  Diagnosis Date   Anxiety    Arthritis    Cancer ([place]) 2021   multiple myloma Right Arm   Cataract    GERD (gastroesophageal reflux disease)    Hyperlipidemia    Hypertension    Urticaria     Past Surgical History:  Procedure Laterality Date   CATARACT EXTRACTION, BILATERAL Bilateral    DILATION AND CURETTAGE OF UTERUS      Prior to Admission medications   Medication Sig Start Date End Date Taking? Authorizing Provider  ALPRAZolam Duanne Moron) 0.5 MG tablet Take 1 tablet (0.5 mg total) by mouth 2 (two) times daily as needed. for anxiety 02/12/22  Yes Baxley, Cresenciano Lick, MD  aspirin 81 MG tablet Take 81 mg by mouth daily.   Yes [provider]  cholecalciferol (VITAMIN D) 1000 UNITS tablet Take 1,000 Units by mouth daily.   Yes [provider]  fexofenadine (ALLEGRA) 180 MG tablet Take 1 tablet (180 mg total) by mouth daily. 06/29/19  Yes Padgett, Rae Halsted, MD  rosuvastatin (CRESTOR) 20 MG tablet Take 1/2 (one-half) tablet by mouth once daily 02/12/22  Yes Baxley, Cresenciano Lick, MD  valsartan-hydrochlorothiazide (DIOVAN-HCT) 160-25 MG tablet TAKE 1 TABLET EVERY DAY 03/09/22  Yes Baxley, Cresenciano Lick, MD  EPINEPHrine 0.3 mg/0.3 mL IJ SOAJ injection Inject 0.3 mLs (0.3 mg total) into the muscle as needed for anaphylaxis. 12/09/18   [name], MD  hydrocortisone valerate cream (WESTCORT) 0.2 % 1  app    [provider]  vitamin E 100 UNIT capsule Take 100 Units by mouth daily.    [provider]    Current Outpatient Medications  Medication Sig Dispense Refill   ALPRAZolam (XANAX) 0.5 MG tablet Take 1 tablet (0.5 mg total) by mouth 2 (two) times daily as needed. for anxiety 60 tablet 1   aspirin 81 MG tablet Take 81 mg by mouth daily.     cholecalciferol (VITAMIN D) 1000 UNITS tablet Take 1,000 Units by mouth daily.     fexofenadine (ALLEGRA) 180 MG tablet Take 1 tablet (180 mg total) by mouth daily. 90 tablet 1   rosuvastatin (CRESTOR) 20 MG tablet Take 1/2 (one-half) tablet by mouth once daily 45 tablet prn   valsartan-hydrochlorothiazide (DIOVAN-HCT) 160-25 MG tablet TAKE 1 TABLET EVERY DAY 90 tablet 10   EPINEPHrine 0.3 mg/0.3 mL IJ SOAJ injection Inject 0.3 mLs (0.3 mg total) into the muscle as needed for anaphylaxis. 1 each 5   hydrocortisone valerate cream (WESTCORT) 0.2 % 1 app     vitamin E 100 UNIT capsule Take 100 Units by mouth daily.     Current Facility-Administered Medications  Medication Dose Route Frequency Provider Last Rate Last Admin   0.9 %  sodium chloride infusion  500 mL Intravenous Once Mauri Pole, MD        Allergies as of 05/06/2022 - Review Complete 05/06/2022  Allergen Reaction Noted  Shellfish allergy Swelling 12/16/2016    Family History  Problem Relation Age of Onset   Stroke Mother    Stroke Father    Diabetes Sister    Breast cancer Sister    Colon cancer Neg Hx    Colon polyps Neg Hx    Esophageal cancer Neg Hx    Rectal cancer Neg Hx    Stomach cancer Neg Hx     Social History   Socioeconomic History   Marital status: Married    Spouse name: Not on file   Number of children: 4   Years of education: Not on file   Highest education level: Not on file  Occupational History   Occupation: Theatre manager  Tobacco Use   Smoking status: Former    Packs/day: 0.25    Years: 20.00    Total pack years:  5.00    Types: Cigarettes    Quit date: 1980    Years since quitting: 44.0   Smokeless tobacco: Never  Vaping Use   Vaping Use: Never used  Substance and Sexual Activity   Alcohol use: No   Drug use: No   Sexual activity: Not on file  Other Topics Concern   Not on file  Social History Narrative   Not on file   Social Determinants of Health   Financial Resource Strain: Not on file  Food Insecurity: Not on file  Transportation Needs: Not on file  Physical Activity: Not on file  Stress: Not on file  Social Connections: Not on file  Intimate Partner Violence: Not At Risk (07/02/2020)   Humiliation, Afraid, Rape, and Kick questionnaire    Fear of Current or Ex-Partner: No    Emotionally Abused: No    Physically Abused: No    Sexually Abused: No    Review of Systems:  All other review of systems negative except as mentioned in the HPI.  Physical Exam: Vital signs in last 24 hours: Blood Pressure 138/77   Pulse (Abnormal) 54   Temperature 97.7 F (36.5 C) (Temporal)   Height '5\' 5"'$  (1.651 m)   Weight 201 lb (91.2 kg)   Oxygen Saturation 98%   Body Mass Index 33.45 kg/m  General:   Alert, NAD Lungs:  Clear .   Heart:  Regular rate and rhythm Abdomen:  Soft, nontender and nondistended. Neuro/Psych:  Alert and cooperative. Normal mood and affect. A and O x 3  Reviewed labs, radiology imaging, old records and pertinent past GI work up  Patient is appropriate for planned procedure(s) and anesthesia in an ambulatory setting   K. Denzil Magnuson , MD [phone]

## 2022-05-06 NOTE — Progress Notes (Unsigned)
Pt's states no medical or surgical changes since previsit or office visit. 

## 2022-05-06 NOTE — Progress Notes (Unsigned)
Report to pacu rn. Vss. Care resumed by rn. 

## 2022-05-06 NOTE — Op Note (Signed)
[place] [facility] Patient Name: Naeema Patlan Procedure Date: 05/06/2022 10:42 AM MRN: [id number] Endoscopist: Mauri Pole , MD, [id number] Age: 72 Referring MD:  Date of Birth: January 24, 1951 Gender: Female Account #: [id number] Procedure:                Colonoscopy Indications:              Screening for colorectal malignant neoplasm Medicines:                Monitored Anesthesia Care Procedure:                Pre-Anesthesia Assessment:                           - Prior to the procedure, a History and Physical                            was performed, and patient medications and                            allergies were reviewed. The patient's tolerance of                            previous anesthesia was also reviewed. The risks                            and benefits of the procedure and the sedation                            options and risks were discussed with the patient.                            All questions were answered, and informed consent                            was obtained. Prior Anticoagulants: The patient has                            taken no anticoagulant or antiplatelet agents. ASA                            Grade Assessment: II - A patient with mild systemic                            disease. After reviewing the risks and benefits,                            the patient was deemed in satisfactory condition to                            undergo the procedure.                           After obtaining informed consent, the colonoscope  was passed under direct vision. Throughout the                            procedure, the patient's blood pressure, pulse, and                            oxygen saturations were monitored continuously. The                            Olympus PCF-H190DL (#[id number]) Colonoscope was                            introduced through the anus and advanced to the the                             cecum, identified by appendiceal orifice and                            ileocecal valve. The colonoscopy was performed                            without difficulty. The patient tolerated the                            procedure well. The quality of the bowel                            preparation was excellent. The ileocecal valve,                            appendiceal orifice, and rectum were photographed. Scope In: 10:56:54 AM Scope Out: 11:10:42 AM Scope Withdrawal Time: 0 hours 8 minutes 10 seconds  Total Procedure Duration: 0 hours 13 minutes 48 seconds  Findings:                 The perianal and digital rectal examinations were                            normal.                           Scattered medium-mouthed diverticula were found in                            the sigmoid colon, descending colon, transverse                            colon, ascending colon and cecum.                           Non-bleeding external and internal hemorrhoids were                            found during retroflexion. The hemorrhoids were  medium-sized. Complications:            No immediate complications. Estimated Blood Loss:     Estimated blood loss was minimal. Impression:               - Moderate diverticulosis in the sigmoid colon, in                            the descending colon, in the transverse colon, in                            the ascending colon and in the cecum.                           - Non-bleeding external and internal hemorrhoids.                           - No specimens collected. Recommendation:           - Patient has a contact number available for                            emergencies. The signs and symptoms of potential                            delayed complications were discussed with the                            patient. Return to normal activities tomorrow.                            Written discharge instructions were provided to the                             patient.                           - Resume previous diet.                           - Continue present medications.                           - No repeat colonoscopy due to age. Mauri Pole, MD 05/06/2022 11:15:37 AM This report has been signed electronically.

## 2022-05-07 ENCOUNTER — Telehealth: Payer: Self-pay

## 2022-05-07 ENCOUNTER — Encounter: Payer: Self-pay | Admitting: Gastroenterology

## 2022-05-07 NOTE — Telephone Encounter (Signed)
No answer, left message to call if having any issues or concerns, B.Melodie Ashworth RN 

## 2022-05-26 ENCOUNTER — Other Ambulatory Visit: Payer: Self-pay | Admitting: Internal Medicine

## 2022-05-26 DIAGNOSIS — F4321 Adjustment disorder with depressed mood: Secondary | ICD-10-CM

## 2022-05-26 DIAGNOSIS — H8110 Benign paroxysmal vertigo, unspecified ear: Secondary | ICD-10-CM

## 2022-06-24 IMAGING — DX DG CHEST 2V
2 series · 2 of 2 positions shown · non-contrast
Comparison: 07/09/2019 and prior.

CLINICAL DATA: COUGH, SOB

EXAM:
CHEST - 2 VIEW

[dg chest 2 view (1 of 2)]
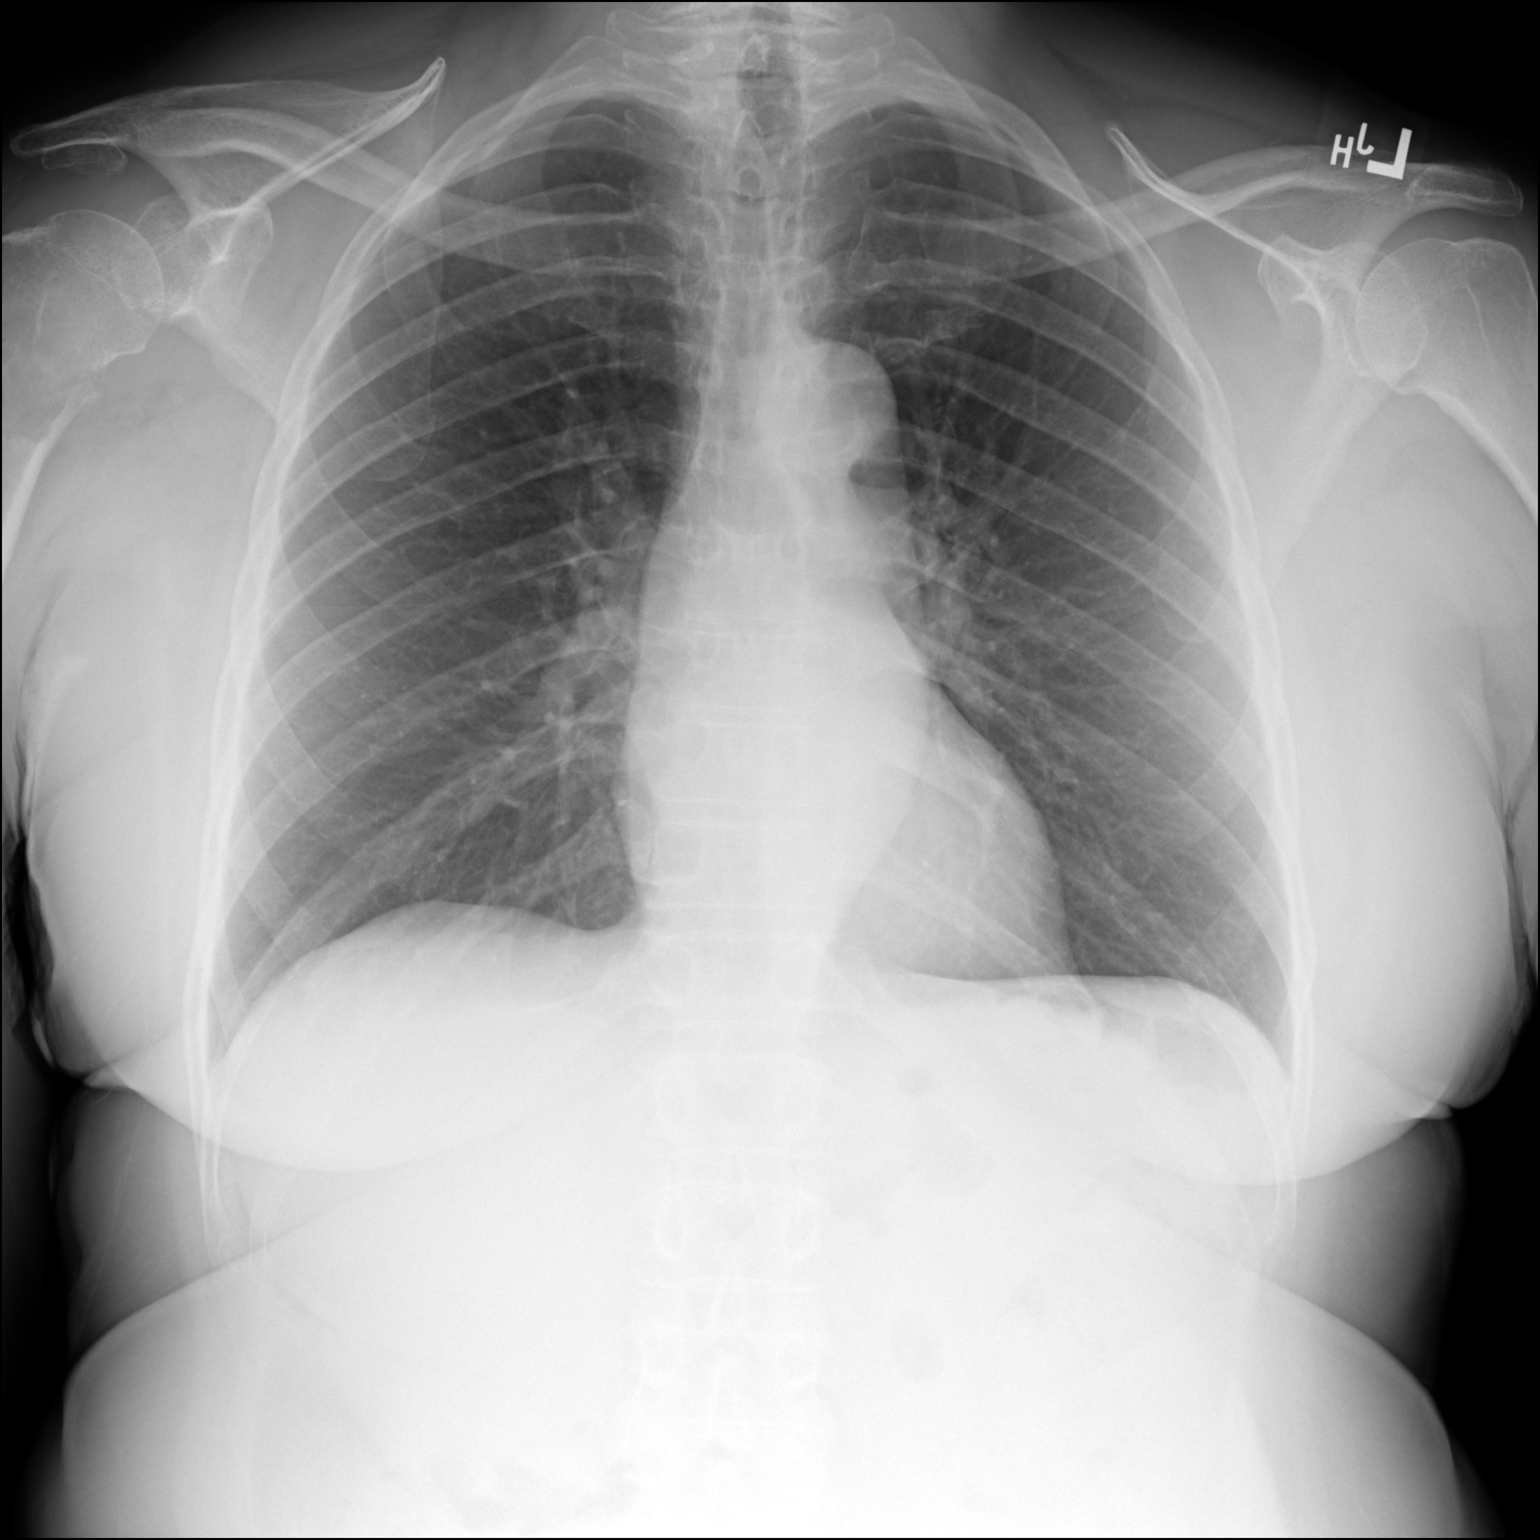

[dg chest 2 view (2 of 2)]
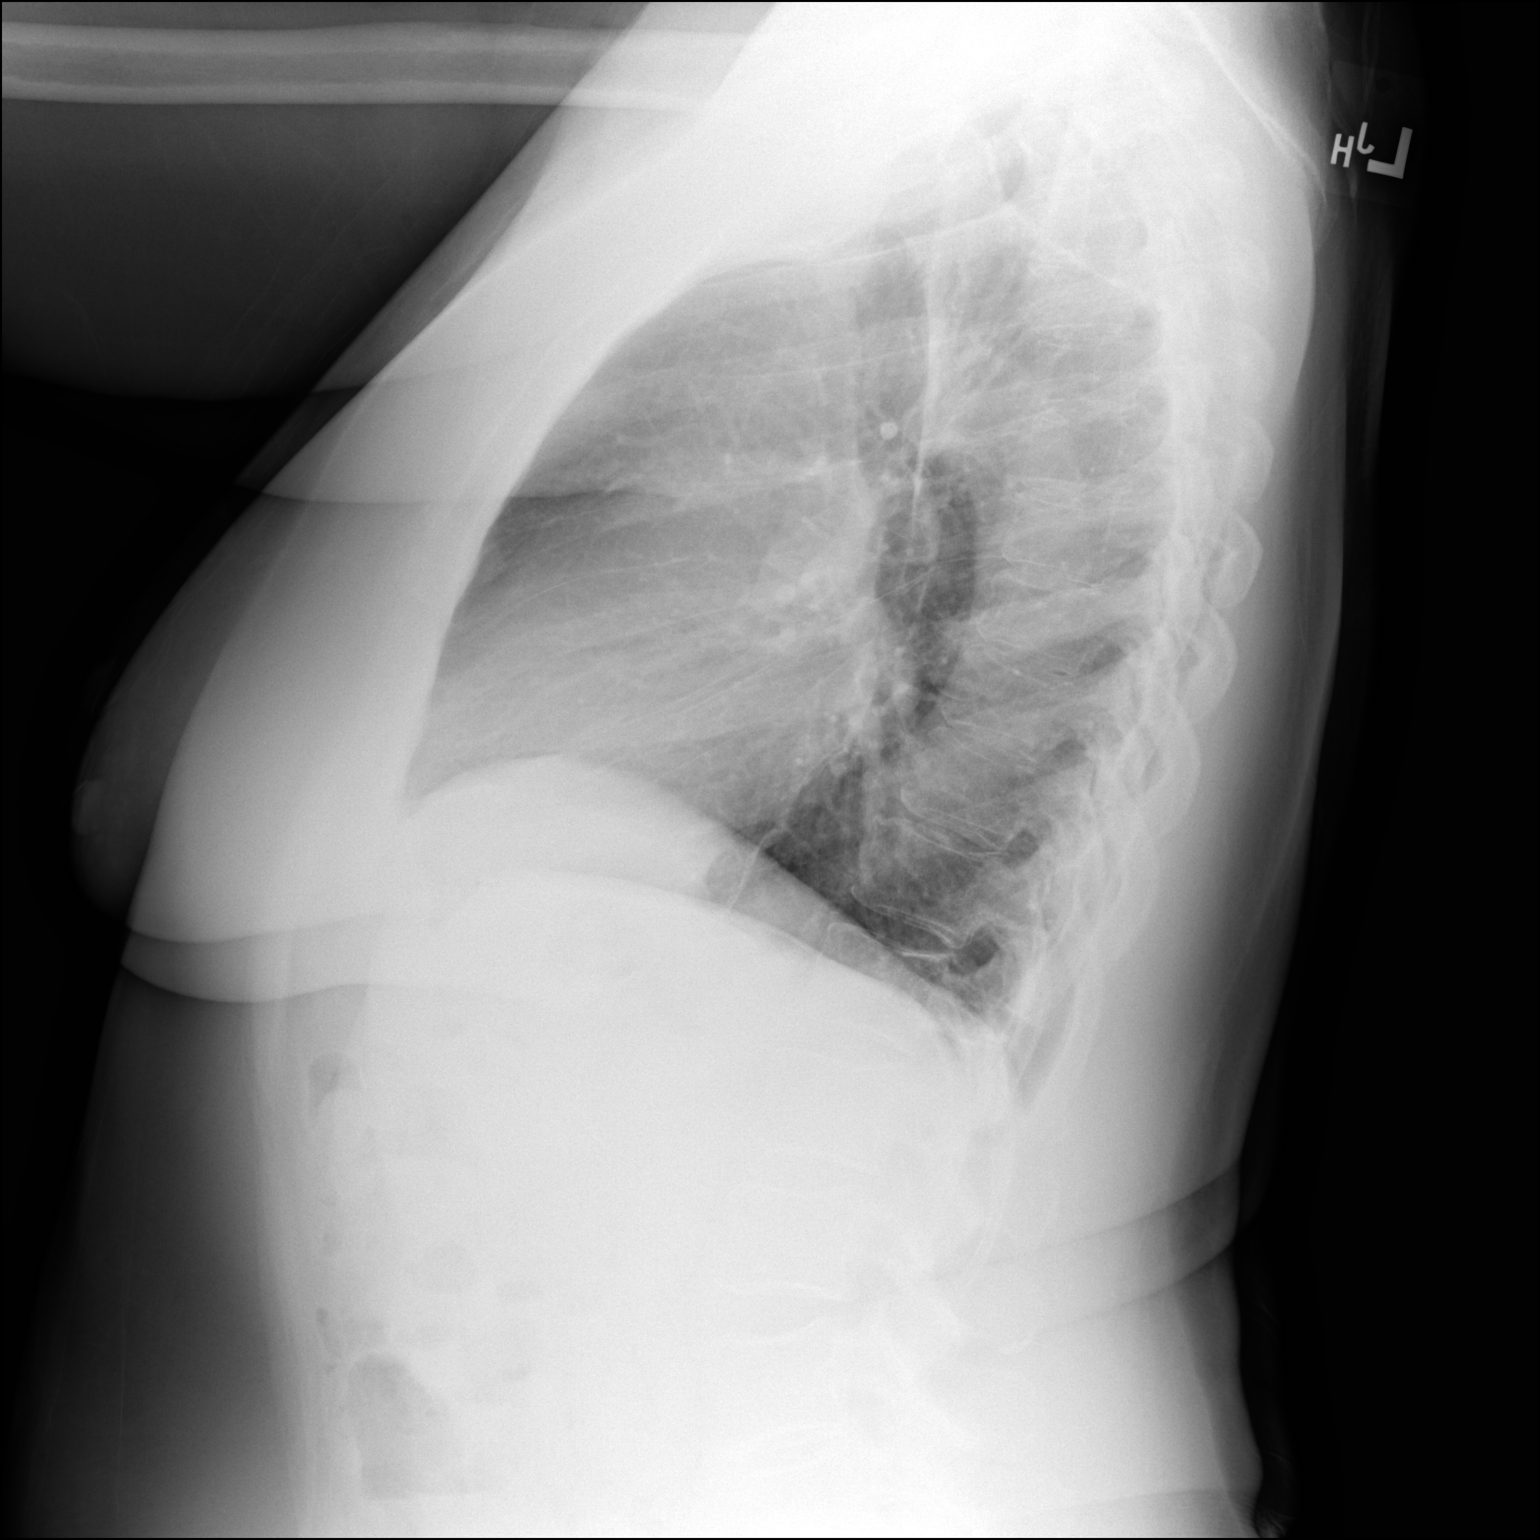

[2 of 2 positions shown; findings below may reference images not displayed]

FINDINGS: No focal consolidation. No pneumothorax or pleural effusion.
Cardiomediastinal silhouette is within normal limits. No acute
osseous abnormality.
IMPRESSION: No focal airspace disease.

## 2022-06-30 ENCOUNTER — Inpatient Hospital Stay: Payer: Medicare HMO | Attending: Internal Medicine

## 2022-06-30 ENCOUNTER — Other Ambulatory Visit: Payer: Self-pay

## 2022-06-30 DIAGNOSIS — Z7982 Long term (current) use of aspirin: Secondary | ICD-10-CM | POA: Diagnosis not present

## 2022-06-30 DIAGNOSIS — Z79899 Other long term (current) drug therapy: Secondary | ICD-10-CM | POA: Insufficient documentation

## 2022-06-30 DIAGNOSIS — C9000 Multiple myeloma not having achieved remission: Secondary | ICD-10-CM | POA: Insufficient documentation

## 2022-06-30 DIAGNOSIS — I10 Essential (primary) hypertension: Secondary | ICD-10-CM | POA: Diagnosis not present

## 2022-06-30 DIAGNOSIS — K219 Gastro-esophageal reflux disease without esophagitis: Secondary | ICD-10-CM | POA: Diagnosis not present

## 2022-06-30 DIAGNOSIS — E785 Hyperlipidemia, unspecified: Secondary | ICD-10-CM | POA: Diagnosis not present

## 2022-06-30 DIAGNOSIS — C9020 Extramedullary plasmacytoma not having achieved remission: Secondary | ICD-10-CM

## 2022-06-30 LAB — LACTATE DEHYDROGENASE: LDH: 160 U/L (ref 98–192)

## 2022-06-30 LAB — CMP (CANCER CENTER ONLY)
ALT: 11 U/L (ref 0–44)
AST: 17 U/L (ref 15–41)
Albumin: 4.4 g/dL (ref 3.5–5.0)
Alkaline Phosphatase: 55 U/L (ref 38–126)
Anion gap: 6 (ref 5–15)
BUN: 22 mg/dL (ref 8–23)
CO2: 31 mmol/L (ref 22–32)
Calcium: 9.6 mg/dL (ref 8.9–10.3)
Chloride: 104 mmol/L (ref 98–111)
Creatinine: 1.04 mg/dL — ABNORMAL HIGH (ref 0.44–1.00)
GFR, Estimated: 57 mL/min — ABNORMAL LOW (ref >60)
Glucose, Bld: 96 mg/dL (ref 70–99)
Potassium: 3.8 mmol/L (ref 3.5–5.1)
Sodium: 141 mmol/L (ref 135–145)
Total Bilirubin: 0.6 mg/dL (ref 0.3–1.2)
Total Protein: 8.7 g/dL — ABNORMAL HIGH (ref 6.5–8.1)

## 2022-06-30 LAB — CBC WITH DIFFERENTIAL (CANCER CENTER ONLY)
Abs Immature Granulocytes: 0.08 10*3/uL — ABNORMAL HIGH (ref 0.00–0.07)
Basophils Absolute: 0.1 10*3/uL (ref 0.0–0.1)
Basophils Relative: 2 %
Eosinophils Absolute: 0.1 10*3/uL (ref 0.0–0.5)
Eosinophils Relative: 2 %
HCT: 38.7 % (ref 36.0–46.0)
Hemoglobin: 13 g/dL (ref 12.0–15.0)
Immature Granulocytes: 2 %
Lymphocytes Relative: 31 %
Lymphs Abs: 1.5 10*3/uL (ref 0.7–4.0)
MCH: 31.5 pg (ref 26.0–34.0)
MCHC: 33.6 g/dL (ref 30.0–36.0)
MCV: 93.7 fL (ref 80.0–100.0)
Monocytes Absolute: 0.6 10*3/uL (ref 0.1–1.0)
Monocytes Relative: 13 %
Neutro Abs: 2.4 10*3/uL (ref 1.7–7.7)
Neutrophils Relative %: 50 %
Platelet Count: 224 10*3/uL (ref 150–400)
RBC: 4.13 MIL/uL (ref 3.87–5.11)
RDW: 13 % (ref 11.5–15.5)
WBC Count: 4.8 10*3/uL (ref 4.0–10.5)
nRBC: 0 % (ref 0.0–0.2)

## 2022-07-01 LAB — KAPPA/LAMBDA LIGHT CHAINS
Kappa free light chain: 129.3 mg/L — ABNORMAL HIGH (ref 3.3–19.4)
Kappa, lambda light chain ratio: 15.21 — ABNORMAL HIGH (ref 0.26–1.65)
Lambda free light chains: 8.5 mg/L (ref 5.7–26.3)

## 2022-07-01 LAB — BETA 2 MICROGLOBULIN, SERUM: Beta-2 Microglobulin: 2.1 mg/L (ref 0.6–2.4)

## 2022-07-01 LAB — IGG, IGA, IGM
IgA: 111 mg/dL (ref 64–422)
IgG (Immunoglobin G), Serum: 2278 mg/dL — ABNORMAL HIGH (ref 586–1602)
IgM (Immunoglobulin M), Srm: 65 mg/dL (ref 26–217)

## 2022-07-07 ENCOUNTER — Inpatient Hospital Stay: Payer: Medicare HMO | Admitting: Internal Medicine

## 2022-07-07 ENCOUNTER — Other Ambulatory Visit: Payer: Self-pay

## 2022-07-07 ENCOUNTER — Other Ambulatory Visit: Payer: Medicare HMO

## 2022-07-07 VITALS — BP 158/78 | HR 63 | Temp 97.7°F | Resp 18 | Ht 65.0 in | Wt 199.1 lb

## 2022-07-07 DIAGNOSIS — Z7982 Long term (current) use of aspirin: Secondary | ICD-10-CM | POA: Diagnosis not present

## 2022-07-07 DIAGNOSIS — E785 Hyperlipidemia, unspecified: Secondary | ICD-10-CM | POA: Diagnosis not present

## 2022-07-07 DIAGNOSIS — Z79899 Other long term (current) drug therapy: Secondary | ICD-10-CM | POA: Diagnosis not present

## 2022-07-07 DIAGNOSIS — C9030 Solitary plasmacytoma not having achieved remission: Secondary | ICD-10-CM | POA: Diagnosis not present

## 2022-07-07 DIAGNOSIS — I10 Essential (primary) hypertension: Secondary | ICD-10-CM | POA: Diagnosis not present

## 2022-07-07 DIAGNOSIS — K219 Gastro-esophageal reflux disease without esophagitis: Secondary | ICD-10-CM | POA: Diagnosis not present

## 2022-07-07 DIAGNOSIS — C9000 Multiple myeloma not having achieved remission: Secondary | ICD-10-CM | POA: Diagnosis not present

## 2022-07-07 NOTE — Progress Notes (Signed)
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  PRIOR THERAPY:  1) Palliative radiotherapy to the plasmacytoma of the proximal right humerus under the care of Dr. Sondra Come. 2) Status post tumor resection from the proximal humerus with reconstruction under the care of Dr. Mylo Red at [facility] on May 01, 2021.  CURRENT THERAPY: Observation  INTERVAL HISTORY: Joanna Ray 72 y.o. female returns to the clinic today for follow-up visit accompanied by her sister.  The patient is feeling fine today with no concerning complaints.  She denied having any fatigue or weakness.  She has no nausea, vomiting, diarrhea or constipation.  She has no headache or visual changes.  She has no chest pain, shortness of breath, cough or hemoptysis.  She is here today for evaluation with repeat myeloma panel.  MEDICAL HISTORY: Past Medical History:  Diagnosis Date   Anxiety    Arthritis    Cancer ([place]) 2021   multiple myloma Right Arm   Cataract    GERD (gastroesophageal reflux disease)    Hyperlipidemia    Hypertension    Urticaria     ALLERGIES:  is allergic to shellfish allergy.  MEDICATIONS:  Current Outpatient Medications  Medication Sig Dispense Refill   ALPRAZolam (XANAX) 0.5 MG tablet Take 1 tablet by mouth twice daily as needed for anxiety 60 tablet 5   aspirin 81 MG tablet Take 81 mg by mouth daily.     cholecalciferol (VITAMIN D) 1000 UNITS tablet Take 1,000 Units by mouth daily.     EPINEPHrine 0.3 mg/0.3 mL IJ SOAJ injection Inject 0.3 mLs (0.3 mg total) into the muscle as needed for anaphylaxis. 1 each 5   fexofenadine (ALLEGRA) 180 MG tablet Take 1 tablet (180 mg total) by mouth daily. 90 tablet 1   hydrocortisone valerate cream  (WESTCORT) 0.2 % 1 app     rosuvastatin (CRESTOR) 20 MG tablet Take 1/2 (one-half) tablet by mouth once daily 45 tablet prn   valsartan-hydrochlorothiazide (DIOVAN-HCT) 160-25 MG tablet TAKE 1 TABLET EVERY DAY 90 tablet 10   vitamin E 100 UNIT capsule Take 100 Units by mouth daily.     No current facility-administered medications for this visit.    SURGICAL HISTORY:  Past Surgical History:  Procedure Laterality Date   CATARACT EXTRACTION, BILATERAL Bilateral    DILATION AND CURETTAGE OF UTERUS      REVIEW OF SYSTEMS:  A comprehensive review of systems was negative.   PHYSICAL EXAMINATION: General appearance: alert, cooperative, and no distress Head: Normocephalic, without obvious abnormality, atraumatic Neck: no adenopathy, no JVD, supple, symmetrical, trachea midline, and thyroid not enlarged, symmetric, no tenderness/mass/nodules Lymph nodes: Cervical, supraclavicular, and axillary nodes normal. Resp: clear to auscultation bilaterally Back: symmetric, no curvature. ROM normal. No CVA tenderness. Cardio: regular rate and rhythm, S1, S2 normal, no murmur, click, rub or gallop GI: soft, non-tender; bowel sounds normal; no masses,  no organomegaly Extremities: extremities normal, atraumatic, no cyanosis or edema and No edema  ECOG PERFORMANCE STATUS: 1 - Symptomatic but completely ambulatory  Blood pressure (!) 158/78, pulse 63, temperature 97.7 F (36.5 C), temperature source Temporal, resp. rate 18, height '5\' 5"'$  (1.651 m), weight 199 lb 1.6 oz (90.3 kg), SpO2 98 %.  LABORATORY DATA: Lab Results  Component Value Date  WBC 4.8 06/30/2022   HGB 13.0 06/30/2022   HCT 38.7 06/30/2022   MCV 93.7 06/30/2022   PLT 224 06/30/2022      Chemistry      Component Value Date/Time   NA 141 06/30/2022 0908   NA 145 (H) 12/28/2018 1436   K 3.8 06/30/2022 0908   CL 104 06/30/2022 0908   CO2 31 06/30/2022 0908   BUN 22 06/30/2022 0908   BUN 15 12/28/2018 1436   CREATININE 1.04 (H)  06/30/2022 0908   CREATININE 1.07 (H) 12/22/2021 1006      Component Value Date/Time   CALCIUM 9.6 06/30/2022 0908   ALKPHOS 55 06/30/2022 0908   AST 17 06/30/2022 0908   ALT 11 06/30/2022 0908   BILITOT 0.6 06/30/2022 0908       RADIOGRAPHIC STUDIES: No results found.  ASSESSMENT AND PLAN: This is a very pleasant 72 years old African-American female with plasmacytoma of the proximal right humerus diagnosed in February 2022.  The patient had extensive work-up for multiple myeloma including a bone marrow biopsy and aspirate that showed only 6% plasma cells.  Her protein studies still suspicious for underlying MGUS/multiple myeloma but the findings are not enough to call it multiple myeloma at this point. The patient underwent radiotherapy to the plasmacytoma of the proximal right humerus and tolerated the procedure fairly well.  She is currently on observation.  The skeletal bone survey showed no other lytic bone lesion except the proximal right humerus. The patient was treated with palliative radiotherapy in the past but recent skeletal bone survey showed progression of the lytic lesion in the proximal humeral diaphysis with pathologic fracture.  She underwent surgical resection of the tumor with reconstruction under the care of Dr. Mylo Red at [facility] on May 01, 2021.   The patient is currently on observation and she is feeling fine with no concerning complaints. Repeat myeloma panel performed recently showed no concerning findings for disease progression. I recommended for her to continue on observation with repeat myeloma panel in 6 months. She was advised to call immediately if she has any other concerning symptoms in the interval. The patient voices understanding of current disease status and treatment options and is in agreement with the current care plan.  All questions were answered. The patient knows to call the clinic with any problems, questions or concerns. We can  certainly see the patient much sooner if necessary.   Disclaimer: This note was dictated with voice recognition software. Similar sounding words can inadvertently be transcribed and may not be corrected upon review.

## 2022-07-07 NOTE — Progress Notes (Signed)
Patient Care Team: [name], MD as PCP - General (Internal Medicine)  Visit Date: 07/14/22  Subjective:    Patient ID: Joanna Ray , Female   DOB: 1950-11-06, 72 y.o.    MRN: [id number]   72 y.o. Female presents today for a 6 month follow-up. Patient has a past medical history of anxiety, arthritis, cancer, cataract, GERD, hyperlipidemia, hypertension, urticaria.  Reports feeling well regarding general health.  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 Seen by her oncologist, Dr. Curt Bears, every 6 months. Repeat myeloma panel performed recently showed no concerning findings for disease progression.   History of Vitamin D deficiency treated with Vitamin D 1,000 units daily.  History of hyperlipidemia treated with Crestor 10 mg daily. Lipid panel normal on 07/09/22.  History of hypertension treated with valsartan-hydrochlorothiazide 160-25 mg daily. Blood pressure normal today at 128/74.  History of anxiety treated with Xanax 0.5 mg twice daily as needed.  Denies swelling in feet/ankles.   Past Medical History:  Diagnosis Date   Anxiety    Arthritis    Cancer ([place]) 2021   multiple myloma Right Arm   Cataract    GERD (gastroesophageal reflux disease)    Hyperlipidemia    Hypertension    Urticaria      Family History  Problem Relation Age of Onset   Stroke Mother    Stroke Father    Diabetes Sister    Breast cancer Sister    Colon cancer Neg Hx    Colon polyps Neg Hx    Esophageal cancer Neg Hx    Rectal cancer Neg Hx    Stomach cancer Neg Hx     Social History   Social History Narrative   Not on file      Review of Systems  Constitutional:  Negative for fever and malaise/fatigue.  HENT:  Negative for congestion.   Eyes:  Negative for blurred vision.  Respiratory:  Negative for cough and shortness of breath.   Cardiovascular:  Negative for chest pain, palpitations and leg swelling.   Gastrointestinal:  Negative for vomiting.  Musculoskeletal:  Negative for back pain.  Skin:  Negative for rash.  Neurological:  Negative for loss of consciousness and headaches.        Objective:   Vitals: BP 128/74   Pulse 60   Temp 98.4 F (36.9 C) (Tympanic)   Ht 5\' 5"  (1.651 m)   Wt 201 lb (91.2 kg)   SpO2 100%   BMI 33.45 kg/m    Physical Exam Vitals and nursing note reviewed.  Constitutional:      General: She is not in acute distress.    Appearance: Normal appearance. She is not toxic-appearing.  HENT:     Head: Normocephalic and atraumatic.  Cardiovascular:     Rate and Rhythm: Normal rate and regular rhythm. No extrasystoles are present.    Heart sounds: Normal heart sounds. No murmur heard.    No gallop.  Pulmonary:     Effort: Pulmonary effort is normal. No respiratory distress.     Breath sounds: Normal breath sounds. No wheezing or rales.  Musculoskeletal:     Right lower leg: No edema.     Left lower leg: No edema.  Skin:    General: Skin is warm and dry.  Neurological:     Mental Status: She is alert and oriented to person, place, and time. Mental status is at baseline.  Psychiatric:  Mood and Affect: Mood normal.        Behavior: Behavior normal.        Thought Content: Thought content normal.        Judgment: Judgment normal.       Results:   Studies obtained and personally reviewed by me:   Labs:       Component Value Date/Time   NA 141 06/30/2022 0908   NA 145 (H) 12/28/2018 1436   K 3.8 06/30/2022 0908   CL 104 06/30/2022 0908   CO2 31 06/30/2022 0908   GLUCOSE 96 06/30/2022 0908   BUN 22 06/30/2022 0908   BUN 15 12/28/2018 1436   CREATININE 1.04 (H) 06/30/2022 0908   CREATININE 1.07 (H) 12/22/2021 1006   CALCIUM 9.6 06/30/2022 0908   PROT 8.2 (H) 07/09/2022 1130   PROT 7.7 12/28/2018 1436   ALBUMIN 4.4 06/30/2022 0908   ALBUMIN 4.6 12/28/2018 1436   AST 17 07/09/2022 1130   AST 17 06/30/2022 0908   ALT 12  07/09/2022 1130   ALT 11 06/30/2022 0908   ALKPHOS 55 06/30/2022 0908   BILITOT 0.6 07/09/2022 1130   BILITOT 0.6 06/30/2022 0908   GFRNONAA 57 (L) 06/30/2022 0908   GFRNONAA 50 (L) 09/03/2020 0909   GFRAA 58 (L) 09/03/2020 0909     Lab Results  Component Value Date   WBC 4.8 06/30/2022   HGB 13.0 06/30/2022   HCT 38.7 06/30/2022   MCV 93.7 06/30/2022   PLT 224 06/30/2022    Lab Results  Component Value Date   CHOL 165 07/09/2022   HDL 50 07/09/2022   LDLCALC 94 07/09/2022   TRIG 113 07/09/2022   CHOLHDL 3.3 07/09/2022    Lab Results  Component Value Date   HGBA1C 5.7 (H) 09/03/2020     Lab Results  Component Value Date   TSH 1.35 12/22/2021      Assessment & Plan:   Plasmacytoma of the proximal right humerus diagnosed in February 2022: seen by her oncologist, Dr. Curt Bears, every 6 months. Repeat myeloma panel performed recently showed no concerning findings for disease progression.   Vitamin D deficiency: treated with Vitamin D 1,000 units daily.  Hyperlipidemia: treated with Crestor 10 mg daily. Lipid panel normal on 07/09/22.  Hypertension: treated with valsartan-hydrochlorothiazide 160-25 mg daily. Blood pressure normal today at 128/74.  Anxiety: treated with Xanax 0.5 mg twice daily as needed.  Vaccine Counseling: Administered pneumococcal 20 vaccine.  Refilled Epipen.    I,Alexander Ruley,acting as a Education administrator for [name], MD.,have documented all relevant documentation on the behalf of [name], MD,as directed by  [name], MD while in the presence of [name], MD.   ***

## 2022-07-08 ENCOUNTER — Inpatient Hospital Stay: Payer: Medicare HMO

## 2022-07-09 ENCOUNTER — Other Ambulatory Visit: Payer: Medicare HMO

## 2022-07-09 DIAGNOSIS — E7800 Pure hypercholesterolemia, unspecified: Secondary | ICD-10-CM | POA: Diagnosis not present

## 2022-07-10 LAB — HEPATIC FUNCTION PANEL
AG Ratio: 1.1 (calc) (ref 1.0–2.5)
ALT: 12 U/L (ref 6–29)
AST: 17 U/L (ref 10–35)
Albumin: 4.2 g/dL (ref 3.6–5.1)
Alkaline phosphatase (APISO): 52 U/L (ref 37–153)
Bilirubin, Direct: 0.3 mg/dL — ABNORMAL HIGH (ref 0.0–0.2)
Globulin: 4 g/dL (calc) — ABNORMAL HIGH (ref 1.9–3.7)
Indirect Bilirubin: 0.3 mg/dL (calc) (ref 0.2–1.2)
Total Bilirubin: 0.6 mg/dL (ref 0.2–1.2)
Total Protein: 8.2 g/dL — ABNORMAL HIGH (ref 6.1–8.1)

## 2022-07-10 LAB — LIPID PANEL
Cholesterol: 165 mg/dL (ref <200)
HDL: 50 mg/dL (ref >=50)
LDL Cholesterol (Calc): 94 mg/dL (calc)
Non-HDL Cholesterol (Calc): 115 mg/dL (calc) (ref <130)
Total CHOL/HDL Ratio: 3.3 (calc) (ref <5.0)
Triglycerides: 113 mg/dL (ref <150)

## 2022-07-14 ENCOUNTER — Encounter: Payer: Self-pay | Admitting: Internal Medicine

## 2022-07-14 ENCOUNTER — Ambulatory Visit (INDEPENDENT_AMBULATORY_CARE_PROVIDER_SITE_OTHER): Payer: Medicare HMO | Admitting: Internal Medicine

## 2022-07-14 ENCOUNTER — Ambulatory Visit: Payer: Medicare HMO | Admitting: Internal Medicine

## 2022-07-14 VITALS — BP 128/74 | HR 60 | Temp 98.4°F | Ht 65.0 in | Wt 201.0 lb

## 2022-07-14 DIAGNOSIS — C9031 Solitary plasmacytoma in remission: Secondary | ICD-10-CM | POA: Diagnosis not present

## 2022-07-14 DIAGNOSIS — I10 Essential (primary) hypertension: Secondary | ICD-10-CM

## 2022-07-14 DIAGNOSIS — Z23 Encounter for immunization: Secondary | ICD-10-CM

## 2022-07-14 DIAGNOSIS — Z8659 Personal history of other mental and behavioral disorders: Secondary | ICD-10-CM

## 2022-07-14 DIAGNOSIS — Z91013 Allergy to seafood: Secondary | ICD-10-CM | POA: Diagnosis not present

## 2022-07-14 DIAGNOSIS — E7800 Pure hypercholesterolemia, unspecified: Secondary | ICD-10-CM

## 2022-07-14 MED ORDER — EPINEPHRINE 0.3 MG/0.3ML IJ SOAJ: 0.3000 mg | INTRAMUSCULAR | 99 refills | Status: AC | PRN

## 2022-07-15 ENCOUNTER — Inpatient Hospital Stay: Payer: Medicare HMO | Admitting: Internal Medicine

## 2022-07-15 NOTE — Patient Instructions (Addendum)
It was a pleasure to see you today.  EpiPen refilled.  Pneumococcal 20 vaccine administered.  Labs are stable.  Continue Xanax twice daily as needed for anxiety.  Continue antihypertensive medication as prescribed.  Blood pressure is normal today.  Continue Crestor 10 mg daily.  Lipid panel is normal.  Continue vitamin D supplement.  Continue follow-up with Dr. Earlie Server for history of plasmacytoma of humerus.

## 2022-10-05 DIAGNOSIS — M84521A Pathological fracture in neoplastic disease, right humerus, initial encounter for fracture: Secondary | ICD-10-CM | POA: Diagnosis not present

## 2022-10-05 DIAGNOSIS — M84521D Pathological fracture in neoplastic disease, right humerus, subsequent encounter for fracture with routine healing: Secondary | ICD-10-CM | POA: Diagnosis not present

## 2022-10-05 DIAGNOSIS — Z96611 Presence of right artificial shoulder joint: Secondary | ICD-10-CM | POA: Diagnosis not present

## 2022-12-22 IMAGING — DX DG HUMERUS 2V *R*
2 series · 2 of 2 positions shown · non-contrast
Comparison: Bone survey 07/03/2020 MRI 06/04/2020.

CLINICAL DATA: Pain post radiation treatment.

EXAM:
RIGHT HUMERUS - 2+ VIEW

[humerus ap]
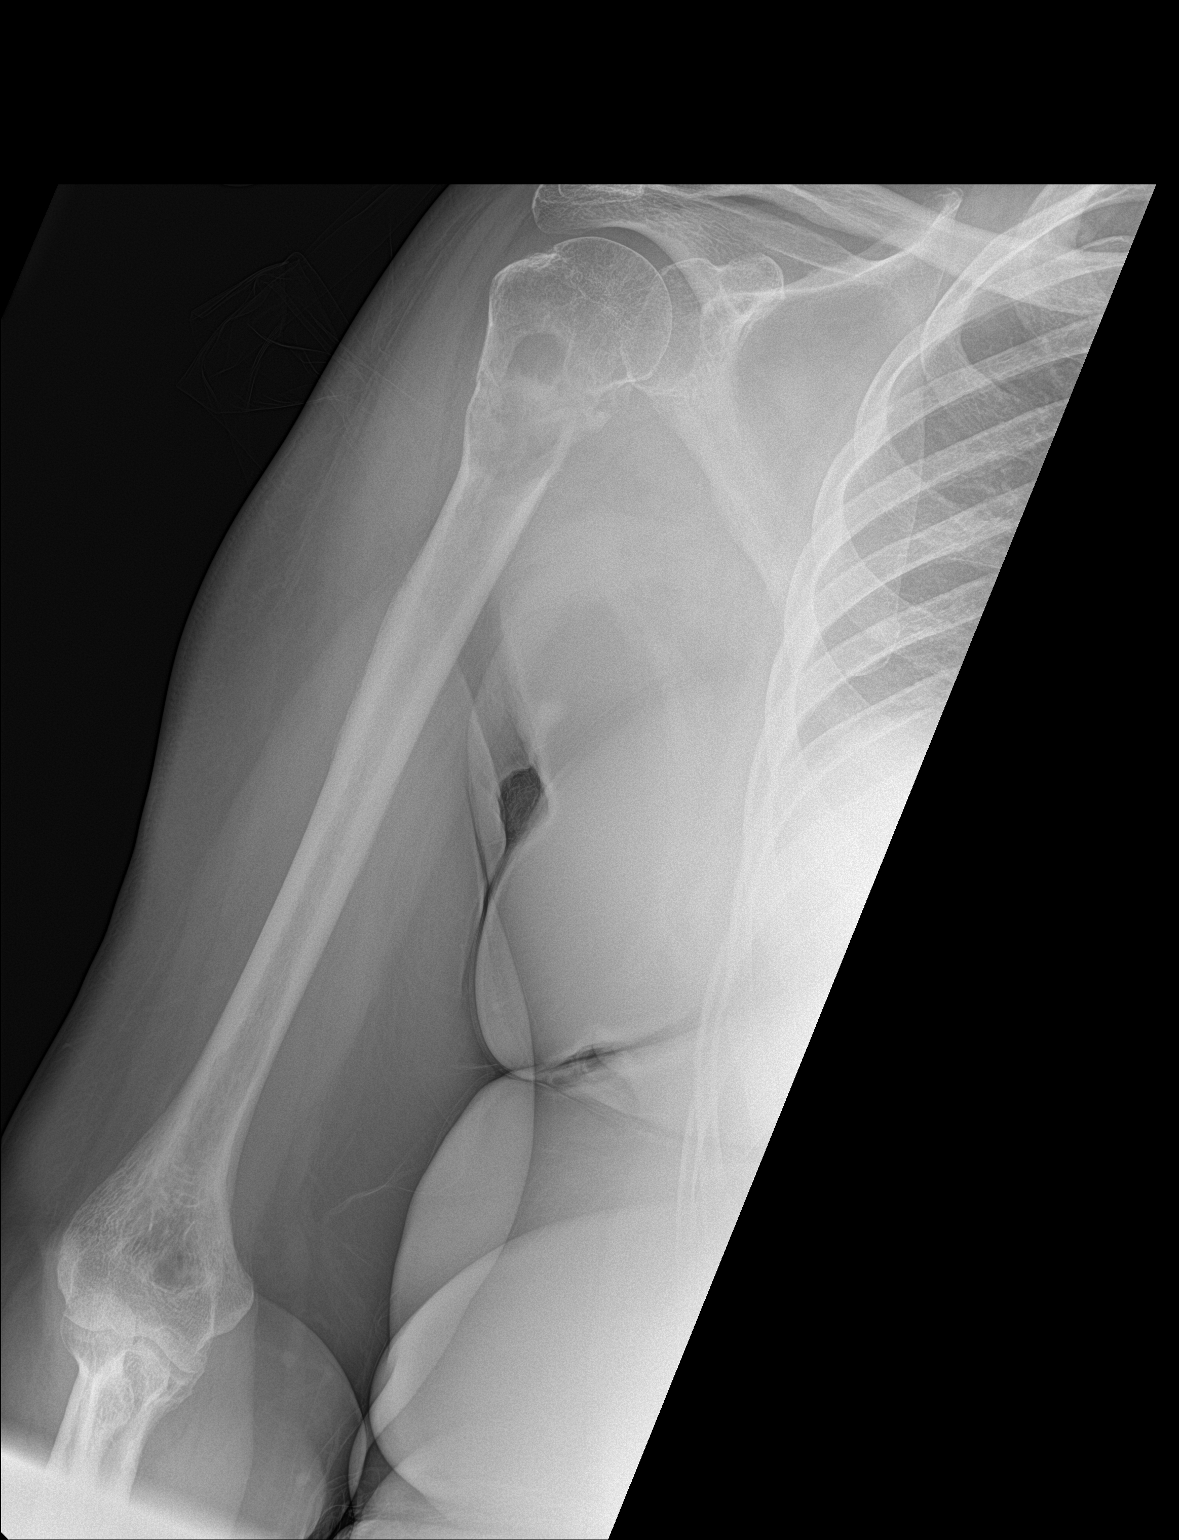

[humerus lat]
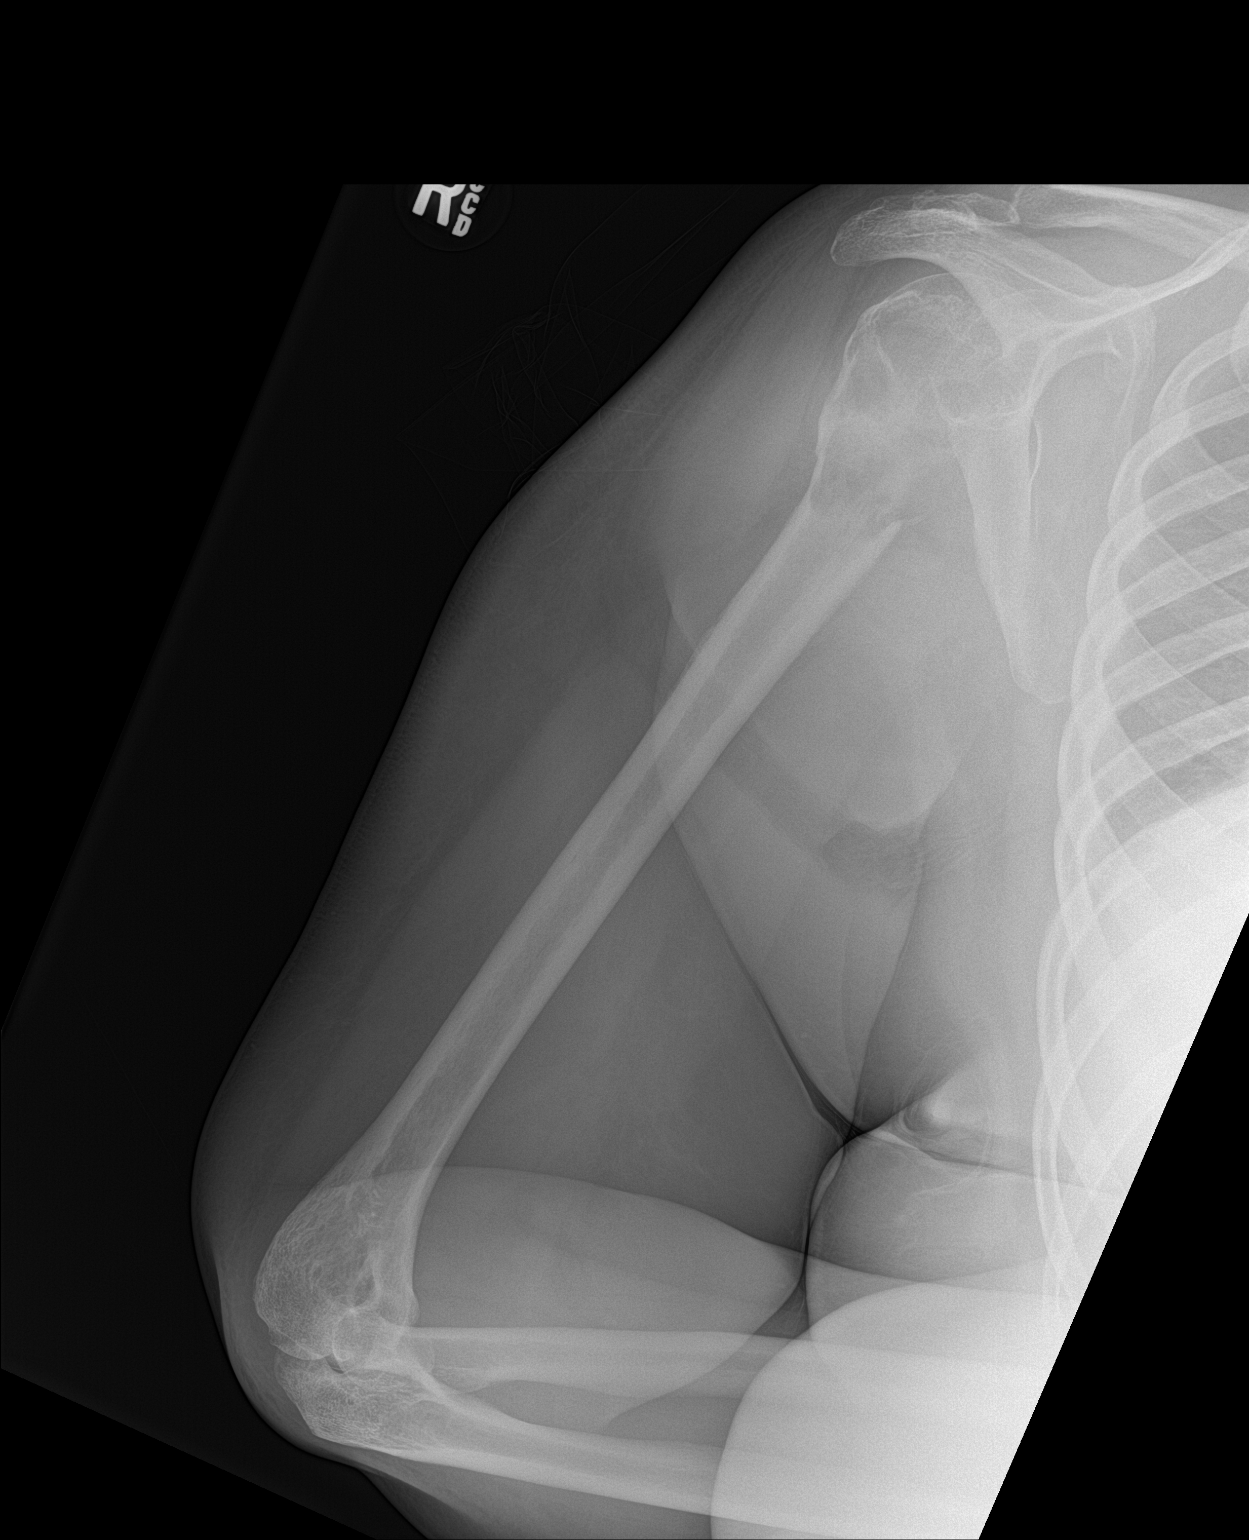

[2 of 2 positions shown; findings below may reference images not displayed]

FINDINGS: Lytic lesion with pathologic nondisplaced fracture again noted in
the proximal right humerus. No significant interval change. No new
focal abnormalities identified.
IMPRESSION: Lytic lesion with pathologic nondisplaced fracture again noted the
proximal right humerus. No significant change.

## 2023-01-01 ENCOUNTER — Telehealth: Payer: Self-pay | Admitting: Internal Medicine

## 2023-01-01 NOTE — Telephone Encounter (Signed)
 Called patient regarding rescheduled September appointments, left a voicemail.

## 2023-01-02 ENCOUNTER — Emergency Department (HOSPITAL_BASED_OUTPATIENT_CLINIC_OR_DEPARTMENT_OTHER)
Admission: EM | Admit: 2023-01-02 | Discharge: 2023-01-02 | Disposition: A | Discharge status: Home / Self Care | Payer: Medicare HMO | Attending: Emergency Medicine | Admitting: Emergency Medicine

## 2023-01-02 ENCOUNTER — Other Ambulatory Visit: Payer: Self-pay

## 2023-01-02 ENCOUNTER — Emergency Department (HOSPITAL_BASED_OUTPATIENT_CLINIC_OR_DEPARTMENT_OTHER): Payer: Medicare HMO

## 2023-01-02 ENCOUNTER — Encounter (HOSPITAL_BASED_OUTPATIENT_CLINIC_OR_DEPARTMENT_OTHER): Payer: Self-pay

## 2023-01-02 DIAGNOSIS — Z79899 Other long term (current) drug therapy: Secondary | ICD-10-CM | POA: Diagnosis not present

## 2023-01-02 DIAGNOSIS — Z1152 Encounter for screening for COVID-19: Secondary | ICD-10-CM | POA: Insufficient documentation

## 2023-01-02 DIAGNOSIS — R0981 Nasal congestion: Secondary | ICD-10-CM | POA: Insufficient documentation

## 2023-01-02 DIAGNOSIS — I10 Essential (primary) hypertension: Secondary | ICD-10-CM | POA: Diagnosis not present

## 2023-01-02 DIAGNOSIS — Z7982 Long term (current) use of aspirin: Secondary | ICD-10-CM | POA: Diagnosis not present

## 2023-01-02 DIAGNOSIS — R42 Dizziness and giddiness: Secondary | ICD-10-CM | POA: Diagnosis not present

## 2023-01-02 DIAGNOSIS — G238 Other specified degenerative diseases of basal ganglia: Secondary | ICD-10-CM | POA: Diagnosis not present

## 2023-01-02 LAB — CBC WITH DIFFERENTIAL/PLATELET
Abs Immature Granulocytes: 0.06 10*3/uL (ref 0.00–0.07)
Basophils Absolute: 0.1 10*3/uL (ref 0.0–0.1)
Basophils Relative: 2 %
Eosinophils Absolute: 0.2 10*3/uL (ref 0.0–0.5)
Eosinophils Relative: 3 %
HCT: 35.6 % — ABNORMAL LOW (ref 36.0–46.0)
Hemoglobin: 11.8 g/dL — ABNORMAL LOW (ref 12.0–15.0)
Immature Granulocytes: 1 %
Lymphocytes Relative: 23 %
Lymphs Abs: 1.2 10*3/uL (ref 0.7–4.0)
MCH: 31.1 pg (ref 26.0–34.0)
MCHC: 33.1 g/dL (ref 30.0–36.0)
MCV: 93.9 fL (ref 80.0–100.0)
Monocytes Absolute: 1 10*3/uL (ref 0.1–1.0)
Monocytes Relative: 19 %
Neutro Abs: 2.7 10*3/uL (ref 1.7–7.7)
Neutrophils Relative %: 52 %
Platelets: 211 10*3/uL (ref 150–400)
RBC: 3.79 MIL/uL — ABNORMAL LOW (ref 3.87–5.11)
RDW: 13.1 % (ref 11.5–15.5)
WBC: 5.2 10*3/uL (ref 4.0–10.5)
nRBC: 0 % (ref 0.0–0.2)

## 2023-01-02 LAB — BASIC METABOLIC PANEL
Anion gap: 7 (ref 5–15)
BUN: 16 mg/dL (ref 8–23)
CO2: 31 mmol/L (ref 22–32)
Calcium: 9 mg/dL (ref 8.9–10.3)
Chloride: 102 mmol/L (ref 98–111)
Creatinine, Ser: 0.99 mg/dL (ref 0.44–1.00)
GFR, Estimated: >60 mL/min (ref >60)
Glucose, Bld: 130 mg/dL — ABNORMAL HIGH (ref 70–99)
Potassium: 3.9 mmol/L (ref 3.5–5.1)
Sodium: 140 mmol/L (ref 135–145)

## 2023-01-02 LAB — RESP PANEL BY RT-PCR (RSV, FLU A&B, COVID)  RVPGX2
Influenza A by PCR: NEGATIVE
Influenza B by PCR: NEGATIVE
Resp Syncytial Virus by PCR: NEGATIVE
SARS Coronavirus 2 by RT PCR: NEGATIVE

## 2023-01-02 MED ORDER — MECLIZINE HCL 25 MG PO TABS
25.0000 mg | ORAL_TABLET | Freq: Once | ORAL | Status: AC
  Administered 2023-01-02: 25 mg via ORAL
  Filled 2023-01-02: qty 1

## 2023-01-02 MED ORDER — MECLIZINE HCL 25 MG PO TABS: 25.0000 mg | ORAL_TABLET | Freq: Three times a day (TID) | ORAL | 0 refills | Status: DC | PRN

## 2023-01-02 NOTE — ED Triage Notes (Addendum)
POV from home, A&o x 4, GCS 15, bib wheelchair but ambulatory to bed  Pt c/o dizziness (worse with position changes) that started yesterday along with nasal congestion and ear congestion. Exposure to covid last week.

## 2023-01-02 NOTE — ED Notes (Signed)
Patient ambulated to the restroom without assistance 

## 2023-01-02 NOTE — ED Provider Notes (Signed)
[place] EMERGENCY DEPARTMENT AT [facility]  Provider Note  CSN: [id number] Arrival date & time: 01/02/23 7829  History Chief Complaint  Patient presents with   Dizziness   Nasal Congestion    Joanna Ray is a 72 y.o. female with history of HTN and isolated plasmacytoma/MGUS in RUE s/p radiation and shoulder replacement but no other treatment for overt multiple myeloma reports she was in her usual state of health yesterday morning but during the day began having some nasal congestion and positional room spinning dizziness that worsened during the night. She got up to use the restroom during the night and found that she was stumbling to walk in the house. She feels like her ears are full. Denies fever. Has had possible Covid exposure at work. She is better with lying down.    Home Medications Prior to Admission medications   Medication Sig Start Date End Date Taking? Authorizing Provider  meclizine (ANTIVERT) 25 MG tablet Take 1 tablet (25 mg total) by mouth 3 (three) times daily as needed for dizziness. 01/02/23  Yes Pollyann Savoy, MD  ALPRAZolam Prudy Feeler) 0.5 MG tablet Take 1 tablet by mouth twice daily as needed for anxiety 05/27/22   [name], MD  aspirin 81 MG tablet Take 81 mg by mouth daily.    [provider]  cholecalciferol (VITAMIN D) 1000 UNITS tablet Take 1,000 Units by mouth daily.    [provider]  EPINEPHrine (EPIPEN 2-PAK) 0.3 mg/0.3 mL IJ SOAJ injection Inject 0.3 mg into the muscle as needed for anaphylaxis. 07/14/22   [name], MD  fexofenadine (ALLEGRA) 180 MG tablet Take 1 tablet (180 mg total) by mouth daily. 06/29/19   Marcelyn Bruins, MD  hydrocortisone valerate cream (WESTCORT) 0.2 % 1 app    [provider]  rosuvastatin (CRESTOR) 20 MG tablet Take 1/2 (one-half) tablet by mouth once daily 02/12/22   [name], MD  valsartan-hydrochlorothiazide (DIOVAN-HCT) 160-25 MG tablet TAKE 1 TABLET EVERY  DAY 03/09/22   [name], MD  vitamin E 100 UNIT capsule Take 100 Units by mouth daily.    [provider]     Allergies    Shellfish allergy   Review of Systems   Review of Systems Please see HPI for pertinent positives and negatives  Physical Exam BP (!) 176/72   Pulse (!) 59   Temp 98.7 F (37.1 C)   Resp 13   SpO2 97%   Physical Exam Vitals and nursing note reviewed.  Constitutional:      Appearance: Normal appearance.  HENT:     Head: Normocephalic and atraumatic.     Right Ear: Tympanic membrane and ear canal normal.     Left Ear: Tympanic membrane and ear canal normal.     Nose: Nose normal.     Mouth/Throat:     Mouth: Mucous membranes are moist.  Eyes:     Extraocular Movements: Extraocular movements intact.     Conjunctiva/sclera: Conjunctivae normal.     Comments: No nystagmus  Cardiovascular:     Rate and Rhythm: Normal rate.  Pulmonary:     Effort: Pulmonary effort is normal.     Breath sounds: Normal breath sounds.  Abdominal:     General: Abdomen is flat.     Palpations: Abdomen is soft.     Tenderness: There is no abdominal tenderness.  Musculoskeletal:        General: No swelling. Normal range of motion.  Cervical back: Neck supple.  Skin:    General: Skin is warm and dry.  Neurological:     General: No focal deficit present.     Mental Status: She is alert and oriented to person, place, and time.     Cranial Nerves: No cranial nerve deficit.     Sensory: No sensory deficit.     Motor: No weakness.     Coordination: Coordination normal.  Psychiatric:        Mood and Affect: Mood normal.     ED Results / Procedures / Treatments   EKG EKG Interpretation Date/Time:  Saturday January 02 2023 04:39:20 EDT Ventricular Rate:  66 PR Interval:  162 QRS Duration:  99 QT Interval:  438 QTC Calculation: 459 R Axis:   22  Text Interpretation: Sinus rhythm Normal ECG No significant change since last tracing Confirmed by  Susy Frizzle [phone]) on 01/02/2023 4:48:11 AM  Procedures Procedures  Medications Ordered in the ED Medications  meclizine (ANTIVERT) tablet 25 mg (25 mg Oral Given 01/02/23 0507)    Initial Impression and Plan  Patient here with vertiginous dizziness, seems to be positional and likely peripheral. Will check labs and CT head. Meclizine for dizziness and reassess.   ED Course   Clinical Course as of 01/02/23 0634  Sat Jan 02, 2023  0516 CBC is unremarkable.  [CS]  0549 BMP is unremarkable.  [CS]  0554 Covid/Flu/RSV swab is negative.  [CS]  [phone] I personally viewed the images from radiology studies and agree with radiologist interpretation: CT is neg for acute process [CS]  0631 Patient is feeling better after Meclizine, able to stand and ambulate without dizziness. I discussed with her and husband peripheral vs central vertigo and while I suspect this is peripheral, I cannot rule out a central cause without MRI. At this time, given her symptom resolution, she would like to defer further workup. Will plan discharge with Rx for Meclizine, recommend rest and PCP follow up, RTED if symptoms worsen or for any other concerns.  [CS]    Clinical Course User Index [CS] Pollyann Savoy, MD     MDM Rules/Calculators/A&P Medical Decision Making Given presenting complaint, I considered that admission might be necessary. After review of results from ED lab and/or imaging studies, admission to the hospital is not indicated at this time.    Problems Addressed: Vertigo: acute illness or injury  Amount and/or Complexity of Data Reviewed Labs: ordered. Decision-making details documented in ED Course. Radiology: ordered and independent interpretation performed. Decision-making details documented in ED Course. ECG/medicine tests: ordered and independent interpretation performed. Decision-making details documented in ED Course.  Risk Prescription drug management. Decision regarding  hospitalization.     Final Clinical Impression(s) / ED Diagnoses Final diagnoses:  Vertigo    Rx / DC Orders ED Discharge Orders          Ordered    meclizine (ANTIVERT) 25 MG tablet  3 times daily PRN        01/02/23 8657             Pollyann Savoy, MD 01/02/23 [phone]

## 2023-01-02 NOTE — ED Notes (Signed)
Pt walked around the whole emergency department with stand by assist with no dizziness or issues.

## 2023-01-06 ENCOUNTER — Inpatient Hospital Stay: Payer: Medicare HMO

## 2023-01-07 ENCOUNTER — Ambulatory Visit (INDEPENDENT_AMBULATORY_CARE_PROVIDER_SITE_OTHER): Payer: Medicare HMO | Admitting: Internal Medicine

## 2023-01-07 ENCOUNTER — Encounter: Payer: Self-pay | Admitting: Internal Medicine

## 2023-01-07 VITALS — BP 120/70 | HR 59 | Ht 65.0 in | Wt 202.0 lb

## 2023-01-07 DIAGNOSIS — I10 Essential (primary) hypertension: Secondary | ICD-10-CM | POA: Diagnosis not present

## 2023-01-07 DIAGNOSIS — Z96611 Presence of right artificial shoulder joint: Secondary | ICD-10-CM

## 2023-01-07 DIAGNOSIS — R42 Dizziness and giddiness: Secondary | ICD-10-CM

## 2023-01-07 DIAGNOSIS — J069 Acute upper respiratory infection, unspecified: Secondary | ICD-10-CM

## 2023-01-07 DIAGNOSIS — Z8579 Personal history of other malignant neoplasms of lymphoid, hematopoietic and related tissues: Secondary | ICD-10-CM | POA: Diagnosis not present

## 2023-01-07 MED ORDER — METHYLPREDNISOLONE ACETATE 80 MG/ML IJ SUSP
80.0000 mg | Freq: Once | INTRAMUSCULAR | Status: AC
Start: 2023-01-07 — End: 2023-01-07
  Administered 2023-01-07: 80 mg via INTRAMUSCULAR

## 2023-01-07 MED ORDER — AZITHROMYCIN 250 MG PO TABS: ORAL_TABLET | ORAL | 0 refills | Status: AC

## 2023-01-07 NOTE — Patient Instructions (Addendum)
Depomedrol 80 mg IM injection given in the office.  Please take Zithromax Z pak 2 tabs day 1 followed by one tab days 2-5 for possible sinusitis.  Please call if not better in 7 to 10 days or sooner if worse.  We feel that you have an upper respiratory infection that may have led to having vertigo.  Blood pressure is stable.

## 2023-01-07 NOTE — Progress Notes (Signed)
Patient Care Team: [name], MD as PCP - General (Internal Medicine)  Visit Date: 01/07/23  Subjective:    Patient ID: Joanna Ray , Female   DOB: January 10, 1951, 72 y.o.    MRN: [id number]   72 y.o. Female presents today for a hospital/ED follow-up. She was seen in [facility] ED on 01/02/23. She began having nasal congestion and positional room spinning dizziness on 01/01/23. She woke up to use the restroom during the night and was stumbling due to vertigo, after which she went to the ED. Believes she had a possible Covid-19 exposure at work.  She underwent evaluation in the emergency department and was discharged home.  Given meclizine for vertigo.  Labs included CBC, BMP normal. Glucose 130.  Testing for Covid/flu/RSV negative. CT head was negative for acute process. EKG showed normal sinus rhythm. Discharged with meclizine 25 mg three times daily as needed. She is currently experiencing post nasal drip with yellow sputum production. Denies sore throat.  She believes she has a sinus infection.  This could have triggered her vertigo.  She had no fall or head trauma preceding the onset of vertigo.  She has a history of plasmacytoma right upper humerus which she is followed by oncology.  Had tumor resection from proximal humerus with reconstruction at [facility] January 2023.  Has done well since that time.  She did receive radiotherapy for the plasmacytoma.  Skeletal survey showed no other lytic lesions.  She has a history of hypertension, anxiety, depression and allergic rhinitis.  History of urticaria treated by Dr. Delorse Lek.  Past Medical History:  Diagnosis Date   Anxiety    Arthritis    Cancer ([facility]) 2021   multiple myloma Right Arm   Cataract    GERD (gastroesophageal reflux disease)    Hyperlipidemia    Hypertension    Urticaria      Family History  Problem Relation Age of Onset   Stroke Mother    Stroke Father    Diabetes Sister    Breast cancer  Sister    Colon cancer Neg Hx    Colon polyps Neg Hx    Esophageal cancer Neg Hx    Rectal cancer Neg Hx    Stomach cancer Neg Hx     Social history: Husband is retired and has congestive heart failure.  Patient has adult children.  Patient previously taught at [employer].  No alcohol consumption.  Smoked in the remote past but not in well over 20 years.  General health has been good.  No history of operations prior to her diagnosis of plasmacytoma.   Review of Systems  Constitutional:  Negative for fever and malaise/fatigue.  HENT:  Negative for congestion and sore throat.        (+) Post nasal drip  Eyes:  Negative for blurred vision.  Respiratory:  Positive for sputum production (Yellow). Negative for cough and shortness of breath.   Cardiovascular:  Negative for chest pain, palpitations and leg swelling.  Gastrointestinal:  Negative for vomiting.  Musculoskeletal:  Negative for back pain.  Skin:  Negative for rash.  Neurological:  Negative for loss of consciousness and headaches.        Objective:   Vitals: BP 120/70   Pulse (!) 59   Ht 5\' 5"  (1.651 m)   Wt 202 lb (91.6 kg)   SpO2 97%   BMI 33.61 kg/m    Physical Exam Vitals and nursing note  reviewed.  Constitutional:      General: She is not in acute distress.    Appearance: Normal appearance. She is not toxic-appearing.  HENT:     Head: Normocephalic and atraumatic.     Ears:     Comments: Right TM dull, slightly full, slightly red. Left TM dull, not full, slightly pink. Pulmonary:     Effort: Pulmonary effort is normal.  Skin:    General: Skin is warm and dry.  Neurological:     Mental Status: She is alert and oriented to person, place, and time. Mental status is at baseline.  Psychiatric:        Mood and Affect: Mood normal.        Behavior: Behavior normal.        Thought Content: Thought content normal.        Judgment: Judgment normal.      Results:   Studies  obtained and personally reviewed by me:  CT head negative for acute process.  Labs:       Component Value Date/Time   NA 140 01/02/2023 0504   NA 145 (H) 12/28/2018 1436   K 3.9 01/02/2023 0504   CL 102 01/02/2023 0504   CO2 31 01/02/2023 0504   GLUCOSE 130 (H) 01/02/2023 0504   BUN 16 01/02/2023 0504   BUN 15 12/28/2018 1436   CREATININE 0.99 01/02/2023 0504   CREATININE 1.04 (H) 06/30/2022 0908   CREATININE 1.07 (H) 12/22/2021 1006   CALCIUM 9.0 01/02/2023 0504   PROT 8.2 (H) 07/09/2022 1130   PROT 7.7 12/28/2018 1436   ALBUMIN 4.4 06/30/2022 0908   ALBUMIN 4.6 12/28/2018 1436   AST 17 07/09/2022 1130   AST 17 06/30/2022 0908   ALT 12 07/09/2022 1130   ALT 11 06/30/2022 0908   ALKPHOS 55 06/30/2022 0908   BILITOT 0.6 07/09/2022 1130   BILITOT 0.6 06/30/2022 0908   GFRNONAA >60 01/02/2023 0504   GFRNONAA 57 (L) 06/30/2022 0908   GFRNONAA 50 (L) 09/03/2020 0909   GFRAA 58 (L) 09/03/2020 0909     Lab Results  Component Value Date   WBC 5.2 01/02/2023   HGB 11.8 (L) 01/02/2023   HCT 35.6 (L) 01/02/2023   MCV 93.9 01/02/2023   PLT 211 01/02/2023    Lab Results  Component Value Date   CHOL 165 07/09/2022   HDL 50 07/09/2022   LDLCALC 94 07/09/2022   TRIG 113 07/09/2022   CHOLHDL 3.3 07/09/2022    Lab Results  Component Value Date   HGBA1C 5.7 (H) 09/03/2020     Lab Results  Component Value Date   TSH 1.35 12/22/2021      Assessment & Plan:   Acute upper respiratory infection: prescribed Z-Pak two tabs day 1 followed by one tab days 2-5. Administered Depo-Medrol 80 mg IM. Get plenty of rest and hydrate well. Contact us if symptoms do not improve.  Benign positional vertigo: this has resolved after ED evaluation and treatment with Antivert.  Essential hypertension-stable  History of anxiety  History of plasmacytoma of right upper arm-doing well..  Had right proximal humerus replacement. at [facility] for plasmacytoma.  Dr.  Shirline Frees is her oncologist.  Duffy Bruce as a scribe for [name], MD.,have documented all relevant documentation on the behalf of [name], MD,as directed by  [name], MD while in the presence of [name], MD.   I, [name], MD, have reviewed all documentation  for this visit. The documentation on 01/17/23 for the exam, diagnosis, procedures, and orders are all accurate and complete.

## 2023-01-11 ENCOUNTER — Inpatient Hospital Stay: Payer: Medicare HMO | Admitting: Internal Medicine

## 2023-01-12 NOTE — Progress Notes (Signed)
Annual Wellness Visit    Patient Care Team: [name], MD as PCP - General (Internal Medicine)  Visit Date: 01/19/23   Chief Complaint  Patient presents with   Medicare Wellness   Also presents for health maintenance exam and evaluation of medical issues.  Subjective:   Patient: Joanna Ray, Female    DOB: [birth year], 72 y.o.   MRN: [id number]  Joanna Ray is a 72 y.o. Female who presents today for her Annual Wellness Visit. History of anxiety, arthritis, multiple myeloma right arm 2021, cataract, GERD, hypertension, hyperlipidemia, urticaria.  Seen in [facility] on 01/02/23 for vertigo. She was also having sinus symptoms that were treated here on 01/07/23 with Z-Pak, Depo-Medrol. Denies further episodes of vertigo.  She has been having back pain in the morning. She has not yet seen a specialist.  History of anxiety treated with alprazolam 0.5 mg twice daily as needed.  History of hyperlipidemia treated with rosuvastatin 10 mg daily. Lipid panel normal.   History of hypertension treated with valsartan-hydrochlorothiazide 160-25 mg daily. Blood pressure normal today at 130/70.  No known drug allergies.  No history of operations prior to diagnosis of her plasmacytoma.   She had a plasmacytoma resection from the proximal humerus with reconstruction by Dr. Jana Half on 05/01/21. She has completed radiotherapy. Continues to see her oncologist, Dr. Arbutus Ped.  Labs are reviewed. Glucose slightly elevated at 104. Creatinine elevated at 1.10. GFR low at 54. Total protein elevated at 8.7. Globulin elevated at 4.3. CBC normal. TSH at 1.77.  Last mammogram completed 03/06/22. No mammographic evidence of malignancy. Repeat recommended in 2024.  Colonoscopy last completed 05/06/22. Showed moderate diverticulosis in sigmoid, descending, transverse, ascending colon and cecum, non-bleeding external and internal hemorrhoids. No repeat colonoscopy recommended due to  age.  Social history: Husband is retired and has congestive heart failure.  Patient used to smoke but has not smoked in over 20 years.  She has adult children and has operates an adult daycare center.  No alcohol consumption.   Family history: Father died at age 29 of a stroke.  Mother died at age 57 with history of stroke in 2010.  1 brother with history of hypertension and MI.  1 sister with history of diabetes.  1 sister with history of hypertension and another sister with history of hypertension.  Son with history of hypertension.  She lost her son in the summer 2019 in an automobile accident.  Past Medical History:  Diagnosis Date   Anxiety    Arthritis    Cancer (HCC) 2021   multiple myloma Right Arm   Cataract    GERD (gastroesophageal reflux disease)    Hyperlipidemia    Hypertension    Urticaria      Family History  Problem Relation Age of Onset   Stroke Mother    Stroke Father    Diabetes Sister    Breast cancer Sister    Colon cancer Neg Hx    Colon polyps Neg Hx    Esophageal cancer Neg Hx    Rectal cancer Neg Hx    Stomach cancer Neg Hx      Social History   Social History Narrative   Not on file     Review of Systems  Constitutional:  Negative for chills, fever, malaise/fatigue and weight loss.  HENT:  Negative for hearing loss, sinus pain and sore throat.   Respiratory:  Negative for cough, hemoptysis and shortness of breath.  Cardiovascular:  Negative for chest pain, palpitations, leg swelling and PND.  Gastrointestinal:  Negative for abdominal pain, constipation, diarrhea, heartburn, nausea and vomiting.  Genitourinary:  Negative for dysuria, frequency and urgency.  Musculoskeletal:  Positive for back pain. Negative for myalgias and neck pain.  Skin:  Negative for itching and rash.  Neurological:  Negative for dizziness, tingling, seizures and headaches.  Endo/Heme/Allergies:  Negative for polydipsia.  Psychiatric/Behavioral:  Negative for  depression. The patient is not nervous/anxious.       Objective:   Vitals: BP 130/70   Pulse 60   Ht 5\' 5"  (1.651 m)   Wt 204 lb (92.5 kg)   SpO2 98%   BMI 33.95 kg/m   Physical Exam Vitals and nursing note reviewed.  Constitutional:      General: She is not in acute distress.    Appearance: Normal appearance. She is not ill-appearing or toxic-appearing.  HENT:     Head: Normocephalic and atraumatic.     Right Ear: Hearing, tympanic membrane, ear canal and external ear normal.     Left Ear: Hearing, tympanic membrane, ear canal and external ear normal.     Mouth/Throat:     Pharynx: Oropharynx is clear.  Eyes:     Extraocular Movements: Extraocular movements intact.     Pupils: Pupils are equal, round, and reactive to light.  Neck:     Thyroid: No thyroid mass, thyromegaly or thyroid tenderness.     Vascular: No carotid bruit.  Cardiovascular:     Rate and Rhythm: Normal rate and regular rhythm. No extrasystoles are present.    Pulses:          Dorsalis pedis pulses are 2+ on the right side and 2+ on the left side.     Heart sounds: Normal heart sounds. No murmur heard.    No friction rub. No gallop.  Pulmonary:     Effort: Pulmonary effort is normal.     Breath sounds: Normal breath sounds. No decreased breath sounds, wheezing, rhonchi or rales.  Chest:     Chest wall: No mass.  Abdominal:     Palpations: Abdomen is soft. There is no hepatomegaly, splenomegaly or mass.     Tenderness: There is no abdominal tenderness.     Hernia: No hernia is present.  Genitourinary:    Comments: NFEG. Bimanual exam normal. Stool guaiac negative. Musculoskeletal:     Cervical back: Normal range of motion.     Right lower leg: No edema.     Left lower leg: No edema.  Lymphadenopathy:     Cervical: No cervical adenopathy.     Upper Body:     Right upper body: No supraclavicular adenopathy.     Left upper body: No supraclavicular adenopathy.  Skin:    General: Skin is warm  and dry.  Neurological:     General: No focal deficit present.     Mental Status: She is alert and oriented to person, place, and time. Mental status is at baseline.     Sensory: Sensation is intact.     Motor: Motor function is intact. No weakness.     Deep Tendon Reflexes: Reflexes are normal and symmetric.  Psychiatric:        Attention and Perception: Attention normal.        Mood and Affect: Mood normal.        Speech: Speech normal.        Behavior: Behavior normal.  Thought Content: Thought content normal.        Cognition and Memory: Cognition normal.        Judgment: Judgment normal.      Most recent functional status assessment:    01/19/2023    3:00 PM  In your present state of health, do you have any difficulty performing the following activities:  Hearing? 0  Vision? 0  Difficulty concentrating or making decisions? 0  Walking or climbing stairs? 0  Dressing or bathing? 0  Doing errands, shopping? 0  Preparing Food and eating ? N  Using the Toilet? N  In the past six months, have you accidently leaked urine? N  Do you have problems with loss of bowel control? N  Managing your Medications? N  Managing your Finances? N  Housekeeping or managing your Housekeeping? N   Most recent fall risk assessment:    01/19/2023    3:06 PM  Fall Risk   Falls in the past year? 0  Number falls in past yr: 0  Injury with Fall? 0  Risk for fall due to : No Fall Risks  Follow up Falls evaluation completed    Most recent depression screenings:    07/14/2022    3:25 PM 01/08/2022    2:08 PM  PHQ 2/9 Scores  PHQ - 2 Score 0 0   Most recent cognitive screening:    01/19/2023    3:04 PM  6CIT Screen  What Year? 0 points  What month? 0 points  What time? 0 points  Count back from 20 0 points  Months in reverse 0 points  Repeat phrase 6 points  Total Score 6 points     Results:   Studies obtained and personally reviewed by me:  Last mammogram completed  03/06/22. No mammographic evidence of malignancy. Repeat recommended in 2024.  Colonoscopy last completed 05/06/22. Showed moderate diverticulosis in sigmoid, descending, transverse, ascending colon and cecum, non-bleeding external and internal hemorrhoids. No repeat colonoscopy recommended due to age.  Labs:       Component Value Date/Time   NA 143 01/14/2023 0908   NA 145 (H) 12/28/2018 1436   K 4.7 01/14/2023 0908   CL 104 01/14/2023 0908   CO2 30 01/14/2023 0908   GLUCOSE 104 (H) 01/14/2023 0908   BUN 22 01/14/2023 0908   BUN 15 12/28/2018 1436   CREATININE 1.10 (H) 01/14/2023 0908   CALCIUM 9.7 01/14/2023 0908   PROT 8.7 (H) 01/14/2023 0908   PROT 7.7 12/28/2018 1436   ALBUMIN 4.4 06/30/2022 0908   ALBUMIN 4.6 12/28/2018 1436   AST 21 01/14/2023 0908   AST 17 06/30/2022 0908   ALT 14 01/14/2023 0908   ALT 11 06/30/2022 0908   ALKPHOS 55 06/30/2022 0908   BILITOT 0.5 01/14/2023 0908   BILITOT 0.6 06/30/2022 0908   GFRNONAA >60 01/02/2023 0504   GFRNONAA 57 (L) 06/30/2022 0908   GFRNONAA 50 (L) 09/03/2020 0909   GFRAA 58 (L) 09/03/2020 0909     Lab Results  Component Value Date   WBC 5.7 01/14/2023   HGB 12.7 01/14/2023   HCT 39.0 01/14/2023   MCV 94.0 01/14/2023   PLT 266 01/14/2023    Lab Results  Component Value Date   CHOL 162 01/14/2023   HDL 54 01/14/2023   LDLCALC 92 01/14/2023   TRIG 71 01/14/2023   CHOLHDL 3.0 01/14/2023    Lab Results  Component Value Date   HGBA1C 5.7 (H) 09/03/2020  Lab Results  Component Value Date   TSH 1.77 01/14/2023    Assessment & Plan:   Vertigo: this has stabilized.  Back pain: prescribed cyclobenzaprine 10 mg three times daily as needed.  Anxiety: stable with alprazolam 0.5 mg twice daily as needed.  Hyperlipidemia: treated with rosuvastatin 10 mg daily. Lipid panel normal.   Hypertension: treated with valsartan-hydrochlorothiazide 160-25 mg daily. Blood pressure normal today at  130/70.  Plasmacytoma: continues to see her oncologist, Dr. Arbutus Ped.  Urinalysis today showed large bilirubin, moderate blood, moderate leukocytes.Culture sent and will contact pt with results  Pelvic exam deferred.  Last mammogram completed 03/06/22. No mammographic evidence of malignancy. Ordered repeat.  Colonoscopy last completed 05/06/22. Showed moderate diverticulosis in sigmoid, descending, transverse, ascending colon and cecum, non-bleeding external and internal hemorrhoids. No repeat colonoscopy recommended due to age.  Vaccine counseling: UTD on tetanus, pneumococcal 20 vaccines. Suggested high-dose flu, Covid-19 boosters, RSV, shingles with several days in-between each.  Return in 6 months for checkup.      Annual wellness visit done today including the all of the following: Reviewed patient's Family Medical History Reviewed and updated list of patient's medical providers Assessment of cognitive impairment was done Assessed patient's functional ability Established a written schedule for health screening services Health Risk Assessent Completed and Reviewed  Discussed health benefits of physical activity, and encouraged her to engage in regular exercise appropriate for her age and condition.        I,Alexander Ruley,acting as a Neurosurgeon for [name], MD.,have documented all relevant documentation on the behalf of [name], MD,as directed by  [name], MD while in the presence of [name], MD.   I, [name], MD, have reviewed all documentation for this visit. The documentation on 01/22/23 for the exam, diagnosis, procedures, and orders are all accurate and complete.

## 2023-01-13 ENCOUNTER — Ambulatory Visit: Payer: Medicare HMO | Admitting: Internal Medicine

## 2023-01-14 ENCOUNTER — Other Ambulatory Visit: Payer: Medicare HMO

## 2023-01-14 DIAGNOSIS — F32A Depression, unspecified: Secondary | ICD-10-CM | POA: Diagnosis not present

## 2023-01-14 DIAGNOSIS — Z0000 Encounter for general adult medical examination without abnormal findings: Secondary | ICD-10-CM | POA: Diagnosis not present

## 2023-01-14 DIAGNOSIS — E7800 Pure hypercholesterolemia, unspecified: Secondary | ICD-10-CM

## 2023-01-14 DIAGNOSIS — I10 Essential (primary) hypertension: Secondary | ICD-10-CM | POA: Diagnosis not present

## 2023-01-14 DIAGNOSIS — F419 Anxiety disorder, unspecified: Secondary | ICD-10-CM

## 2023-01-15 LAB — CBC WITH DIFFERENTIAL/PLATELET
Absolute Monocytes: 559 cells/uL (ref 200–950)
Basophils Absolute: 108 cells/uL (ref 0–200)
Basophils Relative: 1.9 %
Eosinophils Absolute: 91 cells/uL (ref 15–500)
Eosinophils Relative: 1.6 %
HCT: 39 % (ref 35.0–45.0)
Hemoglobin: 12.7 g/dL (ref 11.7–15.5)
Lymphs Abs: 1687 cells/uL (ref 850–3900)
MCH: 30.6 pg (ref 27.0–33.0)
MCHC: 32.6 g/dL (ref 32.0–36.0)
MCV: 94 fL (ref 80.0–100.0)
MPV: 11.1 fL (ref 7.5–12.5)
Monocytes Relative: 9.8 %
Neutro Abs: 3255 cells/uL (ref 1500–7800)
Neutrophils Relative %: 57.1 %
Platelets: 266 10*3/uL (ref 140–400)
RBC: 4.15 10*6/uL (ref 3.80–5.10)
RDW: 12.8 % (ref 11.0–15.0)
Total Lymphocyte: 29.6 %
WBC: 5.7 10*3/uL (ref 3.8–10.8)

## 2023-01-15 LAB — COMPLETE METABOLIC PANEL WITH GFR
AG Ratio: 1 (calc) (ref 1.0–2.5)
ALT: 14 U/L (ref 6–29)
AST: 21 U/L (ref 10–35)
Albumin: 4.4 g/dL (ref 3.6–5.1)
Alkaline phosphatase (APISO): 59 U/L (ref 37–153)
BUN/Creatinine Ratio: 20 (calc) (ref 6–22)
BUN: 22 mg/dL (ref 7–25)
CO2: 30 mmol/L (ref 20–32)
Calcium: 9.7 mg/dL (ref 8.6–10.4)
Chloride: 104 mmol/L (ref 98–110)
Creat: 1.1 mg/dL — ABNORMAL HIGH (ref 0.60–1.00)
Globulin: 4.3 g/dL (calc) — ABNORMAL HIGH (ref 1.9–3.7)
Glucose, Bld: 104 mg/dL — ABNORMAL HIGH (ref 65–99)
Potassium: 4.7 mmol/L (ref 3.5–5.3)
Sodium: 143 mmol/L (ref 135–146)
Total Bilirubin: 0.5 mg/dL (ref 0.2–1.2)
Total Protein: 8.7 g/dL — ABNORMAL HIGH (ref 6.1–8.1)
eGFR: 54 mL/min/{1.73_m2} — ABNORMAL LOW (ref >=60)

## 2023-01-15 LAB — TSH: TSH: 1.77 mIU/L (ref 0.40–4.50)

## 2023-01-15 LAB — LIPID PANEL
Cholesterol: 162 mg/dL (ref <200)
HDL: 54 mg/dL (ref >=50)
LDL Cholesterol (Calc): 92 mg/dL (calc)
Non-HDL Cholesterol (Calc): 108 mg/dL (calc) (ref <130)
Total CHOL/HDL Ratio: 3 (calc) (ref <5.0)
Triglycerides: 71 mg/dL (ref <150)

## 2023-01-17 DIAGNOSIS — Z96611 Presence of right artificial shoulder joint: Secondary | ICD-10-CM | POA: Insufficient documentation

## 2023-01-17 DIAGNOSIS — Z8579 Personal history of other malignant neoplasms of lymphoid, hematopoietic and related tissues: Secondary | ICD-10-CM | POA: Insufficient documentation

## 2023-01-19 ENCOUNTER — Ambulatory Visit: Payer: Medicare HMO | Admitting: Internal Medicine

## 2023-01-19 ENCOUNTER — Other Ambulatory Visit: Payer: Self-pay

## 2023-01-19 ENCOUNTER — Encounter: Payer: Self-pay | Admitting: Internal Medicine

## 2023-01-19 VITALS — BP 130/70 | HR 60 | Ht 65.0 in | Wt 204.0 lb

## 2023-01-19 DIAGNOSIS — Z96611 Presence of right artificial shoulder joint: Secondary | ICD-10-CM | POA: Diagnosis not present

## 2023-01-19 DIAGNOSIS — I10 Essential (primary) hypertension: Secondary | ICD-10-CM | POA: Diagnosis not present

## 2023-01-19 DIAGNOSIS — E7800 Pure hypercholesterolemia, unspecified: Secondary | ICD-10-CM

## 2023-01-19 DIAGNOSIS — Z6833 Body mass index (BMI) 33.0-33.9, adult: Secondary | ICD-10-CM

## 2023-01-19 DIAGNOSIS — R82998 Other abnormal findings in urine: Secondary | ICD-10-CM | POA: Diagnosis not present

## 2023-01-19 DIAGNOSIS — Z8659 Personal history of other mental and behavioral disorders: Secondary | ICD-10-CM | POA: Diagnosis not present

## 2023-01-19 DIAGNOSIS — Z0000 Encounter for general adult medical examination without abnormal findings: Secondary | ICD-10-CM

## 2023-01-19 DIAGNOSIS — Z23 Encounter for immunization: Secondary | ICD-10-CM | POA: Diagnosis not present

## 2023-01-19 DIAGNOSIS — C9031 Solitary plasmacytoma in remission: Secondary | ICD-10-CM | POA: Diagnosis not present

## 2023-01-19 LAB — POCT URINALYSIS DIP (CLINITEK)
Glucose, UA: NEGATIVE mg/dL
Ketones, POC UA: NEGATIVE mg/dL
Nitrite, UA: NEGATIVE
POC PROTEIN,UA: NEGATIVE
Spec Grav, UA: 1.015 (ref 1.010–1.025)
Urobilinogen, UA: 0.2 E.U./dL
pH, UA: 5 (ref 5.0–8.0)

## 2023-01-19 MED ORDER — CYCLOBENZAPRINE HCL 10 MG PO TABS: 10.0000 mg | ORAL_TABLET | Freq: Three times a day (TID) | ORAL | 0 refills | Status: DC | PRN

## 2023-01-20 ENCOUNTER — Inpatient Hospital Stay: Payer: Medicare HMO | Attending: Internal Medicine

## 2023-01-20 DIAGNOSIS — C9030 Solitary plasmacytoma not having achieved remission: Secondary | ICD-10-CM | POA: Insufficient documentation

## 2023-01-20 LAB — CBC WITH DIFFERENTIAL (CANCER CENTER ONLY)
Abs Immature Granulocytes: 0.11 10*3/uL — ABNORMAL HIGH (ref 0.00–0.07)
Basophils Absolute: 0.1 10*3/uL (ref 0.0–0.1)
Basophils Relative: 2 %
Eosinophils Absolute: 0.1 10*3/uL (ref 0.0–0.5)
Eosinophils Relative: 1 %
HCT: 37.3 % (ref 36.0–46.0)
Hemoglobin: 12.2 g/dL (ref 12.0–15.0)
Immature Granulocytes: 2 %
Lymphocytes Relative: 29 %
Lymphs Abs: 1.8 10*3/uL (ref 0.7–4.0)
MCH: 30.8 pg (ref 26.0–34.0)
MCHC: 32.7 g/dL (ref 30.0–36.0)
MCV: 94.2 fL (ref 80.0–100.0)
Monocytes Absolute: 0.7 10*3/uL (ref 0.1–1.0)
Monocytes Relative: 12 %
Neutro Abs: 3.5 10*3/uL (ref 1.7–7.7)
Neutrophils Relative %: 54 %
Platelet Count: 253 10*3/uL (ref 150–400)
RBC: 3.96 MIL/uL (ref 3.87–5.11)
RDW: 13 % (ref 11.5–15.5)
WBC Count: 6.3 10*3/uL (ref 4.0–10.5)
nRBC: 0 % (ref 0.0–0.2)

## 2023-01-20 LAB — CMP (CANCER CENTER ONLY)
ALT: 13 U/L (ref 0–44)
AST: 17 U/L (ref 15–41)
Albumin: 4.3 g/dL (ref 3.5–5.0)
Alkaline Phosphatase: 55 U/L (ref 38–126)
Anion gap: 6 (ref 5–15)
BUN: 19 mg/dL (ref 8–23)
CO2: 31 mmol/L (ref 22–32)
Calcium: 9.5 mg/dL (ref 8.9–10.3)
Chloride: 102 mmol/L (ref 98–111)
Creatinine: 1.07 mg/dL — ABNORMAL HIGH (ref 0.44–1.00)
GFR, Estimated: 56 mL/min — ABNORMAL LOW (ref >60)
Glucose, Bld: 91 mg/dL (ref 70–99)
Potassium: 3.7 mmol/L (ref 3.5–5.1)
Sodium: 139 mmol/L (ref 135–145)
Total Bilirubin: 0.7 mg/dL (ref 0.3–1.2)
Total Protein: 8.8 g/dL — ABNORMAL HIGH (ref 6.5–8.1)

## 2023-01-20 LAB — LACTATE DEHYDROGENASE: LDH: 168 U/L (ref 98–192)

## 2023-01-21 LAB — URINE CULTURE
MICRO NUMBER:: 15510086
SPECIMEN QUALITY:: ADEQUATE

## 2023-01-22 ENCOUNTER — Other Ambulatory Visit: Payer: Self-pay

## 2023-01-22 ENCOUNTER — Telehealth: Payer: Self-pay | Admitting: Internal Medicine

## 2023-01-22 ENCOUNTER — Encounter: Payer: Self-pay | Admitting: Internal Medicine

## 2023-01-22 LAB — IGG, IGA, IGM
IgA: 118 mg/dL (ref 64–422)
IgG (Immunoglobin G), Serum: 3133 mg/dL — ABNORMAL HIGH (ref 586–1602)
IgM (Immunoglobulin M), Srm: 71 mg/dL (ref 26–217)

## 2023-01-22 LAB — KAPPA/LAMBDA LIGHT CHAINS
Kappa free light chain: 177.8 mg/L — ABNORMAL HIGH (ref 3.3–19.4)
Kappa, lambda light chain ratio: 20.44 — ABNORMAL HIGH (ref 0.26–1.65)
Lambda free light chains: 8.7 mg/L (ref 5.7–26.3)

## 2023-01-22 LAB — BETA 2 MICROGLOBULIN, SERUM: Beta-2 Microglobulin: 2 mg/L (ref 0.6–2.4)

## 2023-01-22 MED ORDER — CIPROFLOXACIN HCL 250 MG PO TABS: 250.0000 mg | ORAL_TABLET | Freq: Two times a day (BID) | ORAL | 0 refills | Status: AC

## 2023-01-22 NOTE — Telephone Encounter (Signed)
Cipro sent in and spoke personally with pt. Follow up in 10 days

## 2023-01-22 NOTE — Patient Instructions (Addendum)
It was a pleasure to see you today. Urine has been cultured and we will notify you of results. Pneumococcal vaccine given today. Other vaccines discussed. RTC in 6 months or as needed.

## 2023-01-22 NOTE — Telephone Encounter (Signed)
I have reached pt to let her know she has a UTI. I am placing her on Cipro 250 mg twice daily for 7 days with plans to recheck urine in about 10 days. Rx sent. MJB, MD

## 2023-01-27 ENCOUNTER — Inpatient Hospital Stay: Payer: Medicare HMO | Attending: Internal Medicine | Admitting: Internal Medicine

## 2023-01-27 VITALS — BP 131/69 | HR 58 | Temp 97.6°F | Resp 16 | Ht 65.0 in | Wt 202.9 lb

## 2023-01-27 DIAGNOSIS — R61 Generalized hyperhidrosis: Secondary | ICD-10-CM | POA: Insufficient documentation

## 2023-01-27 DIAGNOSIS — E785 Hyperlipidemia, unspecified: Secondary | ICD-10-CM | POA: Diagnosis not present

## 2023-01-27 DIAGNOSIS — K219 Gastro-esophageal reflux disease without esophagitis: Secondary | ICD-10-CM | POA: Diagnosis not present

## 2023-01-27 DIAGNOSIS — Z8579 Personal history of other malignant neoplasms of lymphoid, hematopoietic and related tissues: Secondary | ICD-10-CM | POA: Diagnosis not present

## 2023-01-27 DIAGNOSIS — Z79899 Other long term (current) drug therapy: Secondary | ICD-10-CM | POA: Diagnosis not present

## 2023-01-27 DIAGNOSIS — Z7982 Long term (current) use of aspirin: Secondary | ICD-10-CM | POA: Diagnosis not present

## 2023-01-27 DIAGNOSIS — M5430 Sciatica, unspecified side: Secondary | ICD-10-CM | POA: Insufficient documentation

## 2023-01-27 DIAGNOSIS — I10 Essential (primary) hypertension: Secondary | ICD-10-CM | POA: Insufficient documentation

## 2023-01-27 DIAGNOSIS — C9030 Solitary plasmacytoma not having achieved remission: Secondary | ICD-10-CM | POA: Diagnosis not present

## 2023-01-27 NOTE — Progress Notes (Signed)
[facility] [phone]   Fax:([phone]  OFFICE PROGRESS NOTE  [name], MD [address] 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  PRIOR THERAPY:  1) Palliative radiotherapy to the plasmacytoma of the proximal right humerus under the care of Dr. Roselind Messier. 2) Status post tumor resection from the proximal humerus with reconstruction under the care of Dr. Jana Half at [facility] on May 01, 2021.  CURRENT THERAPY: Observation  INTERVAL HISTORY: Joanna Ray 72 y.o. female returns to the clinic today for follow-up visit accompanied by her sister.Discussed the use of AI scribe software for clinical note transcription with the patient, who gave verbal consent to proceed.  History of Present Illness   Joanna Ray, a 72 year old patient with a history of plasmacytoma diagnosed two years ago and surgically resected, has been under surveillance for multiple myeloma. Over the past six months, the patient reports no new complaints except for discomfort in the left lower back and leg, suggestive of sciatica. The patient denies any recent weight loss, nausea, vomiting, diarrhea, chest pain, or breathing issues. However, she reports occasional night sweats. Despite these findings, the patient's kidney function remains slightly above normal, and her serum creatinine is consistent with previous levels.      MEDICAL HISTORY: Past Medical History:  Diagnosis Date   Anxiety    Arthritis    Cancer (HCC) 2021   multiple myloma Right Arm   Cataract    GERD (gastroesophageal reflux disease)    Hyperlipidemia    Hypertension    Urticaria     ALLERGIES:  is allergic to shellfish allergy.  MEDICATIONS:  Current Outpatient Medications  Medication Sig Dispense Refill   ALPRAZolam (XANAX) 0.5 MG tablet Take 1 tablet by mouth twice  daily as needed for anxiety 60 tablet 5   aspirin 81 MG tablet Take 81 mg by mouth daily.     cholecalciferol (VITAMIN D) 1000 UNITS tablet Take 1,000 Units by mouth daily.     ciprofloxacin (CIPRO) 250 MG tablet Take 1 tablet (250 mg total) by mouth 2 (two) times daily for 7 days. 14 tablet 0   cyclobenzaprine (FLEXERIL) 10 MG tablet Take 1 tablet (10 mg total) by mouth 3 (three) times daily as needed for muscle spasms. 90 tablet 0   EPINEPHrine (EPIPEN 2-PAK) 0.3 mg/0.3 mL IJ SOAJ injection Inject 0.3 mg into the muscle as needed for anaphylaxis. 1 each PRN   fexofenadine (ALLEGRA) 180 MG tablet Take 1 tablet (180 mg total) by mouth daily. 90 tablet 1   hydrocortisone valerate cream (WESTCORT) 0.2 % 1 app     meclizine (ANTIVERT) 25 MG tablet Take 1 tablet (25 mg total) by mouth 3 (three) times daily as needed for dizziness. 30 tablet 0   rosuvastatin (CRESTOR) 20 MG tablet Take 1/2 (one-half) tablet by mouth once daily 45 tablet prn   valsartan-hydrochlorothiazide (DIOVAN-HCT) 160-25 MG tablet TAKE 1 TABLET EVERY DAY 90 tablet 10   vitamin E 100 UNIT capsule Take 100 Units by mouth daily.     No current facility-administered medications for this visit.    SURGICAL HISTORY:  Past Surgical History:  Procedure Laterality Date   CATARACT EXTRACTION, BILATERAL Bilateral    DILATION AND CURETTAGE OF UTERUS      REVIEW OF SYSTEMS:  Constitutional: negative Eyes: negative Ears, nose, mouth, throat, and face: negative Respiratory: negative Cardiovascular:  negative Gastrointestinal: negative Genitourinary:negative Integument/breast: negative Hematologic/lymphatic: negative Musculoskeletal:positive for arthralgias Neurological: negative Behavioral/Psych: negative Endocrine: negative Allergic/Immunologic: negative   PHYSICAL EXAMINATION: General appearance: alert, cooperative, and no distress Head: Normocephalic, without obvious abnormality, atraumatic Neck: no adenopathy, no JVD,  supple, symmetrical, trachea midline, and thyroid not enlarged, symmetric, no tenderness/mass/nodules Lymph nodes: Cervical, supraclavicular, and axillary nodes normal. Resp: clear to auscultation bilaterally Back: symmetric, no curvature. ROM normal. No CVA tenderness. Cardio: regular rate and rhythm, S1, S2 normal, no murmur, click, rub or gallop GI: soft, non-tender; bowel sounds normal; no masses,  no organomegaly Extremities: extremities normal, atraumatic, no cyanosis or edema and No edema Neurologic: Alert and oriented X 3, normal strength and tone. Normal symmetric reflexes. Normal coordination and gait  ECOG PERFORMANCE STATUS: 1 - Symptomatic but completely ambulatory  Blood pressure 131/69, pulse (!) 58, temperature 97.6 F (36.4 C), temperature source Oral, resp. rate 16, height 5\' 5"  (1.651 m), weight 202 lb 14.4 oz (92 kg), SpO2 100%.  LABORATORY DATA: Lab Results  Component Value Date   WBC 6.3 01/20/2023   HGB 12.2 01/20/2023   HCT 37.3 01/20/2023   MCV 94.2 01/20/2023   PLT 253 01/20/2023      Chemistry      Component Value Date/Time   NA 139 01/20/2023 1300   NA 145 (H) 12/28/2018 1436   K 3.7 01/20/2023 1300   CL 102 01/20/2023 1300   CO2 31 01/20/2023 1300   BUN 19 01/20/2023 1300   BUN 15 12/28/2018 1436   CREATININE 1.07 (H) 01/20/2023 1300   CREATININE 1.10 (H) 01/14/2023 0908      Component Value Date/Time   CALCIUM 9.5 01/20/2023 1300   ALKPHOS 55 01/20/2023 1300   AST 17 01/20/2023 1300   ALT 13 01/20/2023 1300   BILITOT 0.7 01/20/2023 1300       RADIOGRAPHIC STUDIES: CT Head Wo Contrast  Result Date: 01/02/2023 CLINICAL DATA:  72 year old female with dizziness onset yesterday. Nasal and ear congestion. Recent exposure to COVID-19. EXAM: CT HEAD WITHOUT CONTRAST TECHNIQUE: Contiguous axial images were obtained from the base of the skull through the vertex without intravenous contrast. RADIATION DOSE REDUCTION: This exam was performed  according to the departmental dose-optimization program which includes automated exposure control, adjustment of the mA and/or kV according to patient size and/or use of iterative reconstruction technique. COMPARISON:  Head CT 09/25/2011.  Brain MRI 09/26/2011. FINDINGS: Brain: Cerebral volume remains normal. No midline shift, ventriculomegaly, mass effect, evidence of mass lesion, intracranial hemorrhage or evidence of cortically based acute infarction. Gray-white matter differentiation is within normal limits throughout the brain. Small symmetric basal ganglia vascular calcifications now. Vascular: Calcified atherosclerosis at the skull base. No suspicious intracranial vascular hyperdensity. Skull: Chronic right lamina papyracea fracture. No acute osseous abnormality identified. Sinuses/Orbits: Chronic right lamina papyracea fracture. No significant sinus mucosal thickening. Tympanic cavities and mastoids appear clear. Other: Postoperative changes to both globes. No acute orbit or scalp soft tissue finding. IMPRESSION: 1. Normal for age noncontrast CT appearance of the Brain. 2. Chronic right lamina papyracea fracture. Electronically Signed   By: Odessa Fleming M.D.   On: 01/02/2023 05:52    ASSESSMENT AND PLAN: This is a very pleasant 72 years old African-American female with plasmacytoma of the proximal right humerus diagnosed in February 2022.  The patient had extensive work-up for multiple myeloma including a bone marrow biopsy and aspirate that showed only 6% plasma cells.  Her protein studies still suspicious for underlying MGUS/multiple myeloma but  the findings are not enough to call it multiple myeloma at this point. The patient underwent radiotherapy to the plasmacytoma of the proximal right humerus and tolerated the procedure fairly well.  She is currently on observation.  The skeletal bone survey showed no other lytic bone lesion except the proximal right humerus. The patient was treated with palliative  radiotherapy in the past but recent skeletal bone survey showed progression of the lytic lesion in the proximal humeral diaphysis with pathologic fracture.  She underwent surgical resection of the tumor with reconstruction under the care of Dr. Jana Half at [facility] on May 01, 2021.   Assessment and Plan    Plasmacytoma/Multiple Myeloma Increasing IgG and kappa light chains over the past 6 months. No new symptoms of bone pain, weight loss, or renal dysfunction. Discussed the need for further investigation to assess disease activity. -Order bone marrow biopsy and aspirate to assess plasma cell burden. -Follow-up in 1 month to discuss results and potential need for treatment initiation.  Sciatica New onset left-sided back and leg pain. -Consider referral to physical therapy or pain management if symptoms persist or worsen.  Occasional Night Sweats New onset, no associated weight loss or other systemic symptoms. -Monitor for progression or development of additional symptoms.   The patient was advised to call immediately if she has any other concerning symptoms in the interval. The patient voices understanding of current disease status and treatment options and is in agreement with the current care plan.  All questions were answered. The patient knows to call the clinic with any problems, questions or concerns. We can certainly see the patient much sooner if necessary.  The total time spent in the appointment was 30 minutes.  Disclaimer: This note was dictated with voice recognition software. Similar sounding words can inadvertently be transcribed and may not be corrected upon review.

## 2023-02-02 ENCOUNTER — Ambulatory Visit (INDEPENDENT_AMBULATORY_CARE_PROVIDER_SITE_OTHER): Payer: Medicare HMO | Admitting: Internal Medicine

## 2023-02-02 VITALS — BP 130/80 | HR 60 | Temp 97.8°F | Ht 65.0 in | Wt 199.0 lb

## 2023-02-02 DIAGNOSIS — N390 Urinary tract infection, site not specified: Secondary | ICD-10-CM | POA: Diagnosis not present

## 2023-02-02 DIAGNOSIS — B9620 Unspecified Escherichia coli [E. coli] as the cause of diseases classified elsewhere: Secondary | ICD-10-CM | POA: Diagnosis not present

## 2023-02-02 DIAGNOSIS — R82998 Other abnormal findings in urine: Secondary | ICD-10-CM | POA: Diagnosis not present

## 2023-02-02 LAB — POCT URINALYSIS DIP (CLINITEK)
Glucose, UA: NEGATIVE mg/dL
Ketones, POC UA: NEGATIVE mg/dL
Nitrite, UA: NEGATIVE
POC PROTEIN,UA: NEGATIVE
Spec Grav, UA: 1.015 (ref 1.010–1.025)
Urobilinogen, UA: 0.2 U/dL
pH, UA: 6 (ref 5.0–8.0)

## 2023-02-02 NOTE — Patient Instructions (Signed)
Urine will not be recultured.  I think she has been treated adequately but urine needs to be obtained by clean-catch method.  She will bring in the specimen very soon.

## 2023-02-02 NOTE — Progress Notes (Signed)
Subjective:    Patient ID: Joanna Ray, female    DOB: 09-17-1950, 72 y.o.   MRN: [id number]  HPI Patient  here for urine recheck. She collected specimen at home. It is still abnormal but on further inquiry this was not a clean catch specimen. I reviewed in detail today with her how to obtain this specimen correctly. She get another specimen. Prefers to collect at home. And will bring urine specimen within a short time to office.  She was diagnosed with UTI based on urine specimen collected at time of appointment September 24.  Culture growing E. coli greater than 100,000 CFU per mL and she was treated with Cipro for 7 days starting September 27 with plans to recheck 10 days from September 27 which is the date culture results were reviewed by M.D. will not plan to reculture at this time but will ask her to bring a urine clean-catch specimen back to the office.  She does not want to give it here in the office today.  Review of Systems no complaints of frequency or dysuria     Objective:   Physical Exam  Not examined.  Vital signs reviewed.      Assessment & Plan:   UTI symptoms have improved but I do not believe she submitted a clean-catch specimen and urine dipstick today is abnormal.  She will go home and collect another clean-catch specimen properly and bring it back to the office within 1 hour.

## 2023-02-05 ENCOUNTER — Encounter: Payer: Medicare HMO | Admitting: Internal Medicine

## 2023-02-05 ENCOUNTER — Ambulatory Visit (INDEPENDENT_AMBULATORY_CARE_PROVIDER_SITE_OTHER): Payer: Medicare HMO | Admitting: Internal Medicine

## 2023-02-05 ENCOUNTER — Encounter: Payer: Self-pay | Admitting: Internal Medicine

## 2023-02-05 VITALS — BP 110/70 | HR 60 | Ht 65.0 in | Wt 199.0 lb

## 2023-02-05 DIAGNOSIS — R82998 Other abnormal findings in urine: Secondary | ICD-10-CM | POA: Diagnosis not present

## 2023-02-05 DIAGNOSIS — Z0000 Encounter for general adult medical examination without abnormal findings: Secondary | ICD-10-CM

## 2023-02-05 DIAGNOSIS — N390 Urinary tract infection, site not specified: Secondary | ICD-10-CM

## 2023-02-05 LAB — POCT URINALYSIS DIP (CLINITEK)
Blood, UA: NEGATIVE
Glucose, UA: NEGATIVE mg/dL
Ketones, POC UA: NEGATIVE mg/dL
Leukocytes, UA: NEGATIVE
Nitrite, UA: NEGATIVE
POC PROTEIN,UA: NEGATIVE
Spec Grav, UA: 1.01 (ref 1.010–1.025)
Urobilinogen, UA: 0.2 U/dL
pH, UA: 6.5 (ref 5.0–8.0)

## 2023-02-05 NOTE — Progress Notes (Signed)
Patient is here for a ua recheck. Patient has no complaints. Urine is clear.

## 2023-02-05 NOTE — Patient Instructions (Signed)
Urine specimen today done by appropriate clean-catch is normal.  Patient reassured.  No reason to culture the urine specimen.  No treatment given.

## 2023-02-05 NOTE — Progress Notes (Signed)
Subjective:    Patient ID: Joanna Ray, female    DOB: [birth year], 72 y.o.   MRN: [id number]  HPI Here for nurse visit to follow up from recent visit on October 8 at which time she had abnormal urine dipstick results.  Patient had moderate occult blood at last visit and trace LE.  Today dipstick shows no blood and no LE or nitrite.  No protein in urine.  No urobilinogen.  No ketones.  She has no urinary tract infection symptoms.  No dysuria.  I felt that the last specimen on October 8 was not a good clean-catch.  Review of Systems see above     Objective:   Physical Exam  See urine dipstick results.  Not seen by MD but just had nurse visit only      Assessment & Plan:   Abnormal urine dipstick-results today are normal done by appropriate clean-catch collection.  Plan: Repeat urine dipstick today done by clean-catch is within normal limits without blood, LE or nitrite.  No urobilinogen.  No prescriptions given today.  Patient reassured.

## 2023-02-15 ENCOUNTER — Other Ambulatory Visit: Payer: Self-pay | Admitting: Physician Assistant

## 2023-02-15 DIAGNOSIS — Z01818 Encounter for other preprocedural examination: Secondary | ICD-10-CM

## 2023-02-15 NOTE — H&P (Signed)
Referring Physician(s): Mohamed,Mohamed  Supervising Physician: Roanna Banning  Patient Status:  [facility]  Chief Complaint:  "I'm here for a bone marrow biopsy"  Subjective:  Pt known to IR team from BM bx on 06/25/20. 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 She now has  increasing IgG and kappa light chains over the past 6 months and presents again today for CT guided bone marrow biopsy to assess plasma cell burden/rule out myeloma. She denies fever,HA,CP,dyspnea, cough, abd/back pain,N/V or bleeding.    Past Medical History:  Diagnosis Date   Anxiety    Arthritis    Cancer (HCC) 2021   multiple myloma Right Arm   Cataract    GERD (gastroesophageal reflux disease)    Hyperlipidemia    Hypertension    Urticaria    Past Surgical History:  Procedure Laterality Date   CATARACT EXTRACTION, BILATERAL Bilateral    DILATION AND CURETTAGE OF UTERUS        Allergies: Shellfish allergy  Medications: Prior to Admission medications   Medication Sig Start Date End Date Taking? Authorizing Provider  ALPRAZolam Prudy Feeler) 0.5 MG tablet Take 1 tablet by mouth twice daily as needed for anxiety 05/27/22   [name], MD  aspirin 81 MG tablet Take 81 mg by mouth daily.    [provider]  cholecalciferol (VITAMIN D) 1000 UNITS tablet Take 1,000 Units by mouth daily.    [provider]  cyclobenzaprine (FLEXERIL) 10 MG tablet Take 1 tablet (10 mg total) by mouth 3 (three) times daily as needed for muscle spasms. 01/19/23   [name], MD  EPINEPHrine (EPIPEN 2-PAK) 0.3 mg/0.3 mL IJ SOAJ injection Inject 0.3 mg into the muscle as needed for anaphylaxis. 07/14/22   [name], MD  fexofenadine (ALLEGRA) 180 MG tablet Take 1 tablet (180 mg total) by mouth daily. 06/29/19   Marcelyn Bruins, MD   hydrocortisone valerate cream (WESTCORT) 0.2 % 1 app    [provider]  meclizine (ANTIVERT) 25 MG tablet Take 1 tablet (25 mg total) by mouth 3 (three) times daily as needed for dizziness. 01/02/23   Pollyann Savoy, MD  rosuvastatin (CRESTOR) 20 MG tablet Take 1/2 (one-half) tablet by mouth once daily 02/12/22   [name], MD  valsartan-hydrochlorothiazide (DIOVAN-HCT) 160-25 MG tablet TAKE 1 TABLET EVERY DAY 03/09/22   [name], MD  vitamin E 100 UNIT capsule Take 100 Units by mouth daily.    [provider]     Vital Signs: Vitals:   02/16/23 0949  BP: (!) 172/89  Pulse: 60  Resp: 14  Temp: 98.4 F (36.9 C)  SpO2: 97%      Code Status: FULL CODE  Physical Exam: awake/alert; chest- CTA bilat; heart- RRR; abd-soft,+BS,NT; no LE edema  Imaging: No results found.  Labs:  CBC: Recent Labs    06/30/22 0908 01/02/23 0504 01/14/23 0908 01/20/23 1300  WBC 4.8 5.2 5.7 6.3  HGB 13.0 11.8* 12.7 12.2  HCT 38.7 35.6* 39.0 37.3  PLT 224 211 266 253    COAGS: No results for input(s): "INR", "APTT" in the last 8760 hours.  BMP: Recent Labs    06/30/22 0908 01/02/23 0504 01/14/23 0908 01/20/23 1300  NA 141 140 143 139  K 3.8 3.9 4.7 3.7  CL 104 102  104 102  CO2 31 31 30 31   GLUCOSE 96 130* 104* 91  BUN 22 16 22 19   CALCIUM 9.6 9.0 9.7 9.5  CREATININE 1.04* 0.99 1.10* 1.07*  GFRNONAA 57* >60  --  56*    LIVER FUNCTION TESTS: Recent Labs    06/30/22 0908 07/09/22 1130 01/14/23 0908 01/20/23 1300  BILITOT 0.6 0.6 0.5 0.7  AST 17 17 21 17   ALT 11 12 14 13   ALKPHOS 55  --   --  55  PROT 8.7* 8.2* 8.7* 8.8*  ALBUMIN 4.4  --   --  4.3    Assessment and Plan: 72 yo female with PMH anxiety, arthritis, GERD, HLD, HTN and 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 She now has  increasing IgG and kappa light chains over the past 6 months  and presents again today for CT guided bone marrow biopsy to assess plasma cell burden/rule out myeloma.Risks and benefits of procedure was discussed with the patient  including, but not limited to bleeding, infection, damage to adjacent structures or low yield requiring additional tests.  All of the questions were answered and there is agreement to proceed.  Consent signed and in chart.    Electronically Signed: D. Jeananne Rama, PA-C 02/15/2023, 4:20 PM   I spent a total of 20 minutes at the the patient's bedside AND on the patient's hospital floor or unit, greater than 50% of which was counseling/coordinating care for CT guided bone marrow biopsy

## 2023-02-16 ENCOUNTER — Other Ambulatory Visit: Payer: Self-pay

## 2023-02-16 ENCOUNTER — Encounter (HOSPITAL_COMMUNITY): Payer: Self-pay

## 2023-02-16 ENCOUNTER — Ambulatory Visit (HOSPITAL_COMMUNITY)
Admission: RE | Admit: 2023-02-16 | Discharge: 2023-02-16 | Disposition: A | Discharge status: Home / Self Care | Payer: Medicare HMO | Source: Ambulatory Visit | Attending: Internal Medicine | Admitting: Internal Medicine

## 2023-02-16 DIAGNOSIS — C9000 Multiple myeloma not having achieved remission: Secondary | ICD-10-CM | POA: Insufficient documentation

## 2023-02-16 DIAGNOSIS — M1990 Unspecified osteoarthritis, unspecified site: Secondary | ICD-10-CM | POA: Insufficient documentation

## 2023-02-16 DIAGNOSIS — K219 Gastro-esophageal reflux disease without esophagitis: Secondary | ICD-10-CM | POA: Insufficient documentation

## 2023-02-16 DIAGNOSIS — I10 Essential (primary) hypertension: Secondary | ICD-10-CM | POA: Diagnosis not present

## 2023-02-16 DIAGNOSIS — F419 Anxiety disorder, unspecified: Secondary | ICD-10-CM | POA: Diagnosis not present

## 2023-02-16 DIAGNOSIS — E785 Hyperlipidemia, unspecified: Secondary | ICD-10-CM | POA: Insufficient documentation

## 2023-02-16 DIAGNOSIS — Z8579 Personal history of other malignant neoplasms of lymphoid, hematopoietic and related tissues: Secondary | ICD-10-CM

## 2023-02-16 DIAGNOSIS — C9030 Solitary plasmacytoma not having achieved remission: Secondary | ICD-10-CM | POA: Diagnosis not present

## 2023-02-16 DIAGNOSIS — Z01818 Encounter for other preprocedural examination: Secondary | ICD-10-CM

## 2023-02-16 LAB — CBC WITH DIFFERENTIAL/PLATELET
Abs Immature Granulocytes: 0.05 10*3/uL (ref 0.00–0.07)
Basophils Absolute: 0.1 10*3/uL (ref 0.0–0.1)
Basophils Relative: 1 %
Eosinophils Absolute: 0.1 10*3/uL (ref 0.0–0.5)
Eosinophils Relative: 3 %
HCT: 36.6 % (ref 36.0–46.0)
Hemoglobin: 11.9 g/dL — ABNORMAL LOW (ref 12.0–15.0)
Immature Granulocytes: 1 %
Lymphocytes Relative: 16 %
Lymphs Abs: 0.9 10*3/uL (ref 0.7–4.0)
MCH: 31.2 pg (ref 26.0–34.0)
MCHC: 32.5 g/dL (ref 30.0–36.0)
MCV: 95.8 fL (ref 80.0–100.0)
Monocytes Absolute: 0.7 10*3/uL (ref 0.1–1.0)
Monocytes Relative: 12 %
Neutro Abs: 3.7 10*3/uL (ref 1.7–7.7)
Neutrophils Relative %: 67 %
Platelets: 202 10*3/uL (ref 150–400)
RBC: 3.82 MIL/uL — ABNORMAL LOW (ref 3.87–5.11)
RDW: 13.1 % (ref 11.5–15.5)
WBC: 5.6 10*3/uL (ref 4.0–10.5)
nRBC: 0 % (ref 0.0–0.2)

## 2023-02-16 MED ORDER — FENTANYL CITRATE (PF) 100 MCG/2ML IJ SOLN
INTRAMUSCULAR | Status: AC
  Filled 2023-02-16: qty 2

## 2023-02-16 MED ORDER — FENTANYL CITRATE (PF) 100 MCG/2ML IJ SOLN
INTRAMUSCULAR | Status: AC | PRN
  Administered 2023-02-16: 50 ug via INTRAVENOUS

## 2023-02-16 MED ORDER — MIDAZOLAM HCL 2 MG/2ML IJ SOLN
INTRAMUSCULAR | Status: AC
  Filled 2023-02-16: qty 2

## 2023-02-16 MED ORDER — MIDAZOLAM HCL 2 MG/2ML IJ SOLN
INTRAMUSCULAR | Status: AC | PRN
  Administered 2023-02-16: 1 mg via INTRAVENOUS

## 2023-02-16 NOTE — Discharge Instructions (Signed)
Please call Interventional Radiology clinic [phone] with any questions or concerns. ? ?You may remove your dressing and shower tomorrow. ? ? ?Bone Marrow Aspiration and Bone Marrow Biopsy, Adult, Care After ?This sheet gives you information about how to care for yourself after your procedure. Your health care provider may also give you more specific instructions. If you have problems or questions, contact your health care provider. ?What can I expect after the procedure? ?After the procedure, it is common to have: ?Mild pain and tenderness. ?Swelling. ?Bruising. ?Follow these instructions at home: ?Puncture site care ?Follow instructions from your health care provider about how to take care of the puncture site. Make sure you: ?Wash your hands with soap and water before and after you change your bandage (dressing). If soap and water are not available, use hand sanitizer. ?Change your dressing as told by your health care provider. ?Check your puncture site every day for signs of infection. Check for: ?More redness, swelling, or pain. ?Fluid or blood. ?Warmth. ?Pus or a bad smell.   ?Activity ?Return to your normal activities as told by your health care provider. Ask your health care provider what activities are safe for you. ?Do not lift anything that is heavier than 10 lb (4.5 kg), or the limit that you are told, until your health care provider says that it is safe. ?Do not drive for 24 hours if you were given a sedative during your procedure. ?General instructions ?Take over-the-counter and prescription medicines only as told by your health care provider. ?Do not take baths, swim, or use a hot tub until your health care provider approves. Ask your health care provider if you may take showers. You may only be allowed to take sponge baths. ?If directed, put ice on the affected area. To do this: ?Put ice in a plastic bag. ?Place a towel between your skin and the bag. ?Leave the ice on for 20 minutes, 2-3 times a  day. ?Keep all follow-up visits as told by your health care provider. This is important.   ?Contact a health care provider if: ?Your pain is not controlled with medicine. ?You have a fever. ?You have more redness, swelling, or pain around the puncture site. ?You have fluid or blood coming from the puncture site. ?Your puncture site feels warm to the touch. ?You have pus or a bad smell coming from the puncture site. ?Summary ?After the procedure, it is common to have mild pain, tenderness, swelling, and bruising. ?Follow instructions from your health care provider about how to take care of the puncture site and what activities are safe for you. ?Take over-the-counter and prescription medicines only as told by your health care provider. ?Contact a health care provider if you have any signs of infection, such as fluid or blood coming from the puncture site. ?This information is not intended to replace advice given to you by your health care provider. Make sure you discuss any questions you have with your health care provider. ?Document Revised: 08/30/2018 Document Reviewed: 08/30/2018 ?Elsevier Patient Education ? 2021 Elsevier [employer] ? ? ?Moderate Conscious Sedation, Adult, Care After ?This sheet gives you information about how to care for yourself after your procedure. Your health care provider may also give you more specific instructions. If you have problems or questions, contact your health care provider. ?What can I expect after the procedure? ?After the procedure, it is common to have: ?Sleepiness for several hours. ?Impaired judgment for several hours. ?Difficulty with balance. ?Vomiting if you eat too   soon. ?Follow these instructions at home: ?For the time period you were told by your health care provider: ?Rest. ?Do not participate in activities where you could fall or become injured. ?Do not drive or use machinery. ?Do not drink alcohol. ?Do not take sleeping pills or medicines that cause drowsiness. ?Do not  make important decisions or sign legal documents. ?Do not take care of children on your own.  ?  ?  ?Eating and drinking ?Follow the diet recommended by your health care provider. ?Drink enough fluid to keep your urine pale yellow. ?If you vomit: ?Drink water, juice, or soup when you can drink without vomiting. ?Make sure you have little or no nausea before eating solid foods.   ?General instructions ?Take over-the-counter and prescription medicines only as told by your health care provider. ?Have a responsible adult stay with you for the time you are told. It is important to have someone help care for you until you are awake and alert. ?Do not smoke. ?Keep all follow-up visits as told by your health care provider. This is important. ?Contact a health care provider if: ?You are still sleepy or having trouble with balance after 24 hours. ?You feel light-headed. ?You keep feeling nauseous or you keep vomiting. ?You develop a rash. ?You have a fever. ?You have redness or swelling around the IV site. ?Get help right away if: ?You have trouble breathing. ?You have new-onset confusion at home. ?Summary ?After the procedure, it is common to feel sleepy, have impaired judgment, or feel nauseous if you eat too soon. ?Rest after you get home. Know the things you should not do after the procedure. ?Follow the diet recommended by your health care provider and drink enough fluid to keep your urine pale yellow. ?Get help right away if you have trouble breathing or new-onset confusion at home. ?This information is not intended to replace advice given to you by your health care provider. Make sure you discuss any questions you have with your health care provider. ?Document Revised: 08/11/2019 Document Reviewed: 03/09/2019 ?Elsevier Patient Education ? 2021 Elsevier Inc.  ?

## 2023-02-16 NOTE — Procedures (Signed)
Vascular and Interventional Radiology Procedure Note  Patient: Joanna Ray DOB: 12-02-1950 Medical Record Number: [id number] Note Date/Time: 02/16/23 10:23 AM   Performing Physician: Roanna Banning, MD Assistant(s): None  Diagnosis: Hx plasmacytoma  Procedure: BONE MARROW ASPIRATION and BIOPSY  Anesthesia: Conscious Sedation Complications: None Estimated Blood Loss: Minimal Specimens: Sent for Pathology  Findings:  Successful CT-guided bone marrow aspiration and biopsy A total of 1 cores were obtained. Hemostasis of the tract was achieved using Manual Pressure.  Plan: Bed rest for 1 hours.  See detailed procedure note with images in PACS. The patient tolerated the procedure well without incident or complication and was returned to Recovery in stable condition.    Roanna Banning, MD [facility] Radiology   Pager. [phone] Clinic. [phone]

## 2023-02-18 ENCOUNTER — Telehealth: Payer: Self-pay | Admitting: Internal Medicine

## 2023-02-18 NOTE — Telephone Encounter (Signed)
I Called and lvm

## 2023-02-18 NOTE — Telephone Encounter (Signed)
Patient called and said she was seen last week and now she has a head cold that she can't get rid of and wanted to know if Dr Lenord Fellers would send her something in, I let her know if it was something not discussed at previous visit she might have to be seen. Does patient need visit? And if so when would you like her to come in?

## 2023-02-19 ENCOUNTER — Ambulatory Visit (INDEPENDENT_AMBULATORY_CARE_PROVIDER_SITE_OTHER): Payer: Medicare HMO | Admitting: Internal Medicine

## 2023-02-19 ENCOUNTER — Encounter: Payer: Self-pay | Admitting: Internal Medicine

## 2023-02-19 VITALS — BP 130/80 | HR 60 | Temp 98.6°F | Ht 65.0 in | Wt 199.0 lb

## 2023-02-19 DIAGNOSIS — Z8659 Personal history of other mental and behavioral disorders: Secondary | ICD-10-CM | POA: Diagnosis not present

## 2023-02-19 DIAGNOSIS — Z8579 Personal history of other malignant neoplasms of lymphoid, hematopoietic and related tissues: Secondary | ICD-10-CM | POA: Diagnosis not present

## 2023-02-19 DIAGNOSIS — Z96611 Presence of right artificial shoulder joint: Secondary | ICD-10-CM

## 2023-02-19 DIAGNOSIS — I10 Essential (primary) hypertension: Secondary | ICD-10-CM

## 2023-02-19 DIAGNOSIS — J069 Acute upper respiratory infection, unspecified: Secondary | ICD-10-CM

## 2023-02-19 DIAGNOSIS — E7800 Pure hypercholesterolemia, unspecified: Secondary | ICD-10-CM | POA: Diagnosis not present

## 2023-02-19 MED ORDER — HYDROCODONE BIT-HOMATROP MBR 5-1.5 MG/5ML PO SOLN: 5.0000 mL | Freq: Three times a day (TID) | ORAL | 0 refills | Status: DC | PRN

## 2023-02-19 MED ORDER — DOXYCYCLINE HYCLATE 100 MG PO TABS: 100.0000 mg | ORAL_TABLET | Freq: Two times a day (BID) | ORAL | 0 refills | Status: DC

## 2023-02-19 NOTE — Telephone Encounter (Signed)
Patient scheduled.

## 2023-02-19 NOTE — Progress Notes (Signed)
Patient Care Team: [name], MD as PCP - General (Internal Medicine)  Visit Date: 02/19/23  Subjective:    Patient ID: Joanna Ray , Female   DOB: 27-Oct-1950, 72 y.o.    MRN: [id number]   72 y.o. Female presents today for cough, yellow nasal discharge, headache for one week. Denies fever, chills. Symptoms have not improved.  Past Medical History:  Diagnosis Date   Anxiety    Arthritis    Cancer (HCC) 2021   multiple myloma Right Arm   Cataract    GERD (gastroesophageal reflux disease)    Hyperlipidemia    Hypertension    Urticaria      Family History  Problem Relation Age of Onset   Stroke Mother    Stroke Father    Diabetes Sister    Breast cancer Sister    Colon cancer Neg Hx    Colon polyps Neg Hx    Esophageal cancer Neg Hx    Rectal cancer Neg Hx    Stomach cancer Neg Hx     Social History   Social History Narrative   Not on file      Review of Systems  Constitutional:  Negative for fever and malaise/fatigue.  HENT:  Negative for congestion.        (+) Yellow nasal drainage  Eyes:  Negative for blurred vision.  Respiratory:  Positive for cough. Negative for shortness of breath.   Cardiovascular:  Negative for chest pain, palpitations and leg swelling.  Gastrointestinal:  Negative for vomiting.  Musculoskeletal:  Negative for back pain.  Skin:  Negative for rash.  Neurological:  Positive for headaches. Negative for loss of consciousness.        Objective:   Vitals: BP 130/80   Pulse 60   Temp 98.6 F (37 C)   Ht 5\' 5"  (1.651 m)   Wt 199 lb (90.3 kg)   SpO2 95%   BMI 33.12 kg/m    Physical Exam Vitals and nursing note reviewed.  Constitutional:      General: She is not in acute distress.    Appearance: Normal appearance. She is not toxic-appearing.  HENT:     Head: Normocephalic and atraumatic.     Right Ear: Hearing, tympanic membrane, ear canal and external ear normal.     Left Ear: Hearing, tympanic membrane, ear  canal and external ear normal.     Mouth/Throat:     Comments: Pharynx slightly injected. Neck:     Thyroid: No thyroid mass, thyromegaly or thyroid tenderness.  Pulmonary:     Effort: Pulmonary effort is normal. No respiratory distress.     Breath sounds: Normal breath sounds. No wheezing or rales.  Skin:    General: Skin is warm and dry.  Neurological:     Mental Status: She is alert and oriented to person, place, and time. Mental status is at baseline.  Psychiatric:        Mood and Affect: Mood normal.        Behavior: Behavior normal.        Thought Content: Thought content normal.        Judgment: Judgment normal.       Results:   Studies obtained and personally reviewed by me:   Labs:       Component Value Date/Time   NA 139 01/20/2023 1300   NA 145 (H) 12/28/2018 1436   K 3.7 01/20/2023 1300   CL 102 01/20/2023 1300  CO2 31 01/20/2023 1300   GLUCOSE 91 01/20/2023 1300   BUN 19 01/20/2023 1300   BUN 15 12/28/2018 1436   CREATININE 1.07 (H) 01/20/2023 1300   CREATININE 1.10 (H) 01/14/2023 0908   CALCIUM 9.5 01/20/2023 1300   PROT 8.8 (H) 01/20/2023 1300   PROT 7.7 12/28/2018 1436   ALBUMIN 4.3 01/20/2023 1300   ALBUMIN 4.6 12/28/2018 1436   AST 17 01/20/2023 1300   ALT 13 01/20/2023 1300   ALKPHOS 55 01/20/2023 1300   BILITOT 0.7 01/20/2023 1300   GFRNONAA 56 (L) 01/20/2023 1300   GFRNONAA 50 (L) 09/03/2020 0909   GFRAA 58 (L) 09/03/2020 0909     Lab Results  Component Value Date   WBC 5.6 02/16/2023   HGB 11.9 (L) 02/16/2023   HCT 36.6 02/16/2023   MCV 95.8 02/16/2023   PLT 202 02/16/2023    Lab Results  Component Value Date   CHOL 162 01/14/2023   HDL 54 01/14/2023   LDLCALC 92 01/14/2023   TRIG 71 01/14/2023   CHOLHDL 3.0 01/14/2023    Lab Results  Component Value Date   HGBA1C 5.7 (H) 09/03/2020     Lab Results  Component Value Date   TSH 1.77 01/14/2023      Assessment & Plan:   Acute upper respiratory infection:  prescribed doxycycline 100 mg twice daily, hycodan syrup 1 tsp every eight hours as needed for cough. Get plenty of rest and stay well hydrated. Contact us if symptoms worsen or fail to improve.   Vaccine counseling: she will go to the pharmacy for a flu booster.    I,Alexander Ruley,acting as a Neurosurgeon for [name], MD.,have documented all relevant documentation on the behalf of [name], MD,as directed by  [name], MD while in the presence of [name], MD.   ***

## 2023-02-20 ENCOUNTER — Other Ambulatory Visit: Payer: Self-pay | Admitting: Internal Medicine

## 2023-02-21 NOTE — Patient Instructions (Signed)
Recommend influenza vaccine when patient is completely over this acute respiratory infection.  She will rest and stay well-hydrated.  Prescribe doxycycline 100 mg twice daily for 10 days and Hycodan syrup 1 teaspoon every 8 hours as needed for cough.  Blood pressure is stable.  Call if not better in 3 to 5 days or sooner if worse.  We are sorry you are not feeling well.

## 2023-02-22 LAB — SURGICAL PATHOLOGY

## 2023-02-25 ENCOUNTER — Encounter (HOSPITAL_COMMUNITY): Payer: Self-pay

## 2023-03-02 ENCOUNTER — Encounter (HOSPITAL_COMMUNITY): Payer: Self-pay

## 2023-03-03 ENCOUNTER — Inpatient Hospital Stay: Payer: Medicare HMO | Attending: Internal Medicine

## 2023-03-03 ENCOUNTER — Inpatient Hospital Stay: Payer: Medicare HMO | Admitting: Internal Medicine

## 2023-03-03 VITALS — BP 160/73 | HR 62 | Temp 97.7°F | Resp 17

## 2023-03-03 DIAGNOSIS — Z7982 Long term (current) use of aspirin: Secondary | ICD-10-CM | POA: Insufficient documentation

## 2023-03-03 DIAGNOSIS — M25552 Pain in left hip: Secondary | ICD-10-CM | POA: Diagnosis not present

## 2023-03-03 DIAGNOSIS — C9030 Solitary plasmacytoma not having achieved remission: Secondary | ICD-10-CM | POA: Diagnosis not present

## 2023-03-03 DIAGNOSIS — Z923 Personal history of irradiation: Secondary | ICD-10-CM | POA: Diagnosis not present

## 2023-03-03 DIAGNOSIS — Z79899 Other long term (current) drug therapy: Secondary | ICD-10-CM | POA: Insufficient documentation

## 2023-03-03 DIAGNOSIS — Z8579 Personal history of other malignant neoplasms of lymphoid, hematopoietic and related tissues: Secondary | ICD-10-CM | POA: Diagnosis not present

## 2023-03-03 DIAGNOSIS — M25559 Pain in unspecified hip: Secondary | ICD-10-CM | POA: Diagnosis not present

## 2023-03-03 LAB — CMP (CANCER CENTER ONLY)
ALT: 12 U/L (ref 0–44)
AST: 17 U/L (ref 15–41)
Albumin: 3.8 g/dL (ref 3.5–5.0)
Alkaline Phosphatase: 57 U/L (ref 38–126)
Anion gap: 6 (ref 5–15)
BUN: 26 mg/dL — ABNORMAL HIGH (ref 8–23)
CO2: 31 mmol/L (ref 22–32)
Calcium: 9.1 mg/dL (ref 8.9–10.3)
Chloride: 106 mmol/L (ref 98–111)
Creatinine: 1.07 mg/dL — ABNORMAL HIGH (ref 0.44–1.00)
GFR, Estimated: 55 mL/min — ABNORMAL LOW (ref >60)
Glucose, Bld: 103 mg/dL — ABNORMAL HIGH (ref 70–99)
Potassium: 4.2 mmol/L (ref 3.5–5.1)
Sodium: 143 mmol/L (ref 135–145)
Total Bilirubin: 0.7 mg/dL (ref <1.2)
Total Protein: 8 g/dL (ref 6.5–8.1)

## 2023-03-03 LAB — CBC WITH DIFFERENTIAL (CANCER CENTER ONLY)
Abs Immature Granulocytes: 0.15 10*3/uL — ABNORMAL HIGH (ref 0.00–0.07)
Basophils Absolute: 0.1 10*3/uL (ref 0.0–0.1)
Basophils Relative: 2 %
Eosinophils Absolute: 0.1 10*3/uL (ref 0.0–0.5)
Eosinophils Relative: 2 %
HCT: 35.3 % — ABNORMAL LOW (ref 36.0–46.0)
Hemoglobin: 11.5 g/dL — ABNORMAL LOW (ref 12.0–15.0)
Immature Granulocytes: 3 %
Lymphocytes Relative: 29 %
Lymphs Abs: 1.6 10*3/uL (ref 0.7–4.0)
MCH: 30.8 pg (ref 26.0–34.0)
MCHC: 32.6 g/dL (ref 30.0–36.0)
MCV: 94.6 fL (ref 80.0–100.0)
Monocytes Absolute: 0.7 10*3/uL (ref 0.1–1.0)
Monocytes Relative: 12 %
Neutro Abs: 3 10*3/uL (ref 1.7–7.7)
Neutrophils Relative %: 52 %
Platelet Count: 242 10*3/uL (ref 150–400)
RBC: 3.73 MIL/uL — ABNORMAL LOW (ref 3.87–5.11)
RDW: 13.5 % (ref 11.5–15.5)
WBC Count: 5.7 10*3/uL (ref 4.0–10.5)
nRBC: 0 % (ref 0.0–0.2)

## 2023-03-03 LAB — LACTATE DEHYDROGENASE: LDH: 191 U/L (ref 98–192)

## 2023-03-03 NOTE — Progress Notes (Signed)
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  PRIOR THERAPY:  1) Palliative radiotherapy to the plasmacytoma of the proximal right humerus under the care of Dr. Roselind Messier. 2) Status post tumor resection from the proximal humerus with reconstruction under the care of Dr. Jana Half at [facility] on May 01, 2021.  CURRENT THERAPY: Observation  INTERVAL HISTORY: Joanna Ray 72 y.o. female returns to the clinic today for follow-up visit accompanied by her sister.Discussed the use of AI scribe software for clinical note transcription with the patient, who gave verbal consent to proceed.  History of Present Illness   Joanna Ray, a 72 year old patient with a history of smoldering multiple myeloma, initially diagnosed in February 2022, presents for follow-up. The initial diagnosis was made following the detection of a plasmacytoma in the right humerus, which was subsequently resected and treated with radiation. Since then, the patient has been under observation with periodic monitoring.  This prompted a bone marrow biopsy, performed approximately two weeks prior to this consultation. The patient tolerated the procedure well, reporting no significant discomfort. The biopsy results revealed the presence of plasma cells, constituting between 10 to 20 percent of the bone marrow cells. Despite this, the patient does not yet meet the criteria for full-blown multiple myeloma and remains classified as having smoldering multiple myeloma, an intermediate-risk condition.  The patient also reports persistent pain in the hip, particularly noticeable upon waking in the morning. The pain radiates down the left leg, initially thought to be sciatica.        MEDICAL HISTORY: Past Medical History:  Diagnosis Date   Anxiety    Arthritis    Cancer (HCC) 2021   multiple myloma Right Arm   Cataract    GERD (gastroesophageal reflux disease)    Hyperlipidemia    Hypertension    Urticaria     ALLERGIES:  is allergic to shellfish allergy.  MEDICATIONS:  Current Outpatient Medications  Medication Sig Dispense Refill   ALPRAZolam (XANAX) 0.5 MG tablet Take 1 tablet by mouth twice daily as needed for anxiety 60 tablet 5   aspirin 81 MG tablet Take 81 mg by mouth daily.     cholecalciferol (VITAMIN D) 1000 UNITS tablet Take 1,000 Units by mouth daily.     cyclobenzaprine (FLEXERIL) 10 MG tablet Take 1 tablet (10 mg total) by mouth 3 (three) times daily as needed for muscle spasms. 90 tablet 0   doxycycline (VIBRA-TABS) 100 MG tablet Take 1 tablet (100 mg total) by mouth 2 (two) times daily. 20 tablet 0   EPINEPHrine (EPIPEN 2-PAK) 0.3 mg/0.3 mL IJ SOAJ injection Inject 0.3 mg into the muscle as needed for anaphylaxis. 1 each PRN   fexofenadine (ALLEGRA) 180 MG tablet Take 1 tablet (180 mg total) by mouth daily. 90 tablet 1   HYDROcodone bit-homatropine (HYCODAN) 5-1.5 MG/5ML syrup Take 5 mLs by mouth every 8 (eight) hours as needed for cough. 120 mL 0   hydrocortisone valerate cream (WESTCORT) 0.2 % 1 app     meclizine (ANTIVERT) 25 MG tablet Take 1 tablet (25 mg total) by mouth 3 (three) times daily as needed for dizziness. 30 tablet 0   rosuvastatin (CRESTOR) 20 MG tablet TAKE 1/2 TABLET ONE TIME DAILY 45 tablet 3   valsartan-hydrochlorothiazide (DIOVAN-HCT) 160-25  MG tablet TAKE 1 TABLET EVERY DAY 90 tablet 10   vitamin E 100 UNIT capsule Take 100 Units by mouth daily.     No current facility-administered medications for this visit.    SURGICAL HISTORY:  Past Surgical History:  Procedure Laterality Date   CATARACT EXTRACTION, BILATERAL Bilateral    DILATION AND CURETTAGE OF UTERUS      REVIEW OF SYSTEMS:  Constitutional:  negative Eyes: negative Ears, nose, mouth, throat, and face: negative Respiratory: negative Cardiovascular: negative Gastrointestinal: negative Genitourinary:negative Integument/breast: negative Hematologic/lymphatic: negative Musculoskeletal:positive for arthralgias and bone pain Neurological: negative Behavioral/Psych: negative Endocrine: negative Allergic/Immunologic: negative   PHYSICAL EXAMINATION: General appearance: alert, cooperative, and no distress Head: Normocephalic, without obvious abnormality, atraumatic Neck: no adenopathy, no JVD, supple, symmetrical, trachea midline, and thyroid not enlarged, symmetric, no tenderness/mass/nodules Lymph nodes: Cervical, supraclavicular, and axillary nodes normal. Resp: clear to auscultation bilaterally Back: symmetric, no curvature. ROM normal. No CVA tenderness. Cardio: regular rate and rhythm, S1, S2 normal, no murmur, click, rub or gallop GI: soft, non-tender; bowel sounds normal; no masses,  no organomegaly Extremities: extremities normal, atraumatic, no cyanosis or edema and No edema Neurologic: Alert and oriented X 3, normal strength and tone. Normal symmetric reflexes. Normal coordination and gait  ECOG PERFORMANCE STATUS: 1 - Symptomatic but completely ambulatory  Blood pressure (!) 160/73, pulse 62, temperature 97.7 F (36.5 C), temperature source Oral, resp. rate 17, SpO2 100%.  LABORATORY DATA: Lab Results  Component Value Date   WBC 5.7 03/03/2023   HGB 11.5 (L) 03/03/2023   HCT 35.3 (L) 03/03/2023   MCV 94.6 03/03/2023   PLT 242 03/03/2023      Chemistry      Component Value Date/Time   NA 139 01/20/2023 1300   NA 145 (H) 12/28/2018 1436   K 3.7 01/20/2023 1300   CL 102 01/20/2023 1300   CO2 31 01/20/2023 1300   BUN 19 01/20/2023 1300   BUN 15 12/28/2018 1436   CREATININE 1.07 (H) 01/20/2023 1300   CREATININE 1.10 (H) 01/14/2023 0908      Component Value Date/Time   CALCIUM 9.5 01/20/2023 1300    ALKPHOS 55 01/20/2023 1300   AST 17 01/20/2023 1300   ALT 13 01/20/2023 1300   BILITOT 0.7 01/20/2023 1300       RADIOGRAPHIC STUDIES: CT BONE MARROW BIOPSY & ASPIRATION  Result Date: 02/16/2023 INDICATION: Plasmacytoma, rule out progression to multiple myeloma EXAM: CT GUIDED BONE MARROW ASPIRATION AND CORE BIOPSY MEDICATIONS: None. ANESTHESIA/SEDATION: Moderate (conscious) sedation was employed during this procedure. A total of Versed 1 mg and Fentanyl 50 mcg was administered intravenously. Moderate Sedation Time: 15 minutes. The patient's level of consciousness and vital signs were monitored continuously by radiology nursing throughout the procedure under my direct supervision. FLUOROSCOPY TIME:  CT dose; 245 mGycm COMPLICATIONS: None immediate. Estimated blood loss: <5 mL PROCEDURE: RADIATION DOSE REDUCTION: This exam was performed according to the departmental dose-optimization program which includes automated exposure control, adjustment of the mA and/or kV according to patient size and/or use of iterative reconstruction technique. Informed written consent was obtained from the the patient and/or patient's representative after a thorough discussion of the procedural risks, benefits and alternatives. All questions were addressed. Maximal Sterile Barrier Technique was utilized including caps, mask, sterile gowns, sterile gloves, sterile drape, hand hygiene and skin antiseptic. A timeout was performed prior to the initiation of the procedure. The patient was positioned prone and non-contrast localization CT was performed of the pelvis to demonstrate  the iliac marrow spaces. Maximal barrier sterile technique utilized including caps, mask, sterile gowns, sterile gloves, large sterile drape, hand hygiene, and chlorhexidine prep. Under sterile conditions and local anesthesia, an 11 gauge coaxial bone biopsy needle was advanced into the RIGHT iliac marrow space. Needle position was confirmed with CT  imaging. Initially, bone marrow aspiration was performed. Next, the 11 gauge outer cannula was utilized to obtain a 1 iliac bone marrow core biopsy. Needle was removed. Hemostasis was obtained with compression. The patient tolerated the procedure well. Samples were prepared with the cytotechnologist. IMPRESSION: Successful CT-guided bone marrow aspiration and biopsy. Roanna Banning, MD [facility] Electronically Signed   By: Roanna Banning M.D.   On: 02/16/2023 12:16    ASSESSMENT AND PLAN: This is a very pleasant 72 years old African-American female with plasmacytoma of the proximal right humerus diagnosed in February 2022.  The patient had extensive work-up for multiple myeloma including a bone marrow biopsy and aspirate that showed only 6% plasma cells.  Her protein studies still suspicious for underlying MGUS/multiple myeloma but the findings are not enough to call it multiple myeloma at this point. The patient underwent radiotherapy to the plasmacytoma of the proximal right humerus and tolerated the procedure fairly well.  She is currently on observation.  The skeletal bone survey showed no other lytic bone lesion except the proximal right humerus. The patient was treated with palliative radiotherapy in the past but recent skeletal bone survey showed progression of the lytic lesion in the proximal humeral diaphysis with pathologic fracture.  She underwent surgical resection of the tumor with reconstruction under the care of Dr. Jana Half at [facility] on May 01, 2021.   The patient was found on recent protein study to have elevated IgG level.  She underwent a bone marrow biopsy and aspirate that showed 10-20% plasma cells.    Smoldering Multiple Myeloma Intermediate risk with increasing IgG protein levels. Bone marrow biopsy showed 10-20% plasma cells. Discussed the risks/benefits of early treatment with Revlimid and Dexamethasone vs  continued observation. Patient undecided on treatment initiation. -Continue observation with blood work every 3-6 months. -Consider initiation of Revlimid and Dexamethasone if patient decides on early treatment. -Plan to see patient in 3 months or sooner if patient decides to start treatment.  Hip Pain New onset, possibly related to sciatica. No clear relation to myeloma at this time. -Order skeletal bone survey to evaluate for possible myeloma-related bone lesions. -Consider PET scan if skeletal survey shows concerning findings.   She was advised to call immediately if she has any other concerning symptoms in the interval.  The patient voices understanding of current disease status and treatment options and is in agreement with the current care plan.  All questions were answered. The patient knows to call the clinic with any problems, questions or concerns. We can certainly see the patient much sooner if necessary.  The total time spent in the appointment was 30 minutes.  Disclaimer: This note was dictated with voice recognition software. Similar sounding words can inadvertently be transcribed and may not be corrected upon review.

## 2023-03-10 ENCOUNTER — Ambulatory Visit: Payer: Medicare HMO

## 2023-03-10 ENCOUNTER — Other Ambulatory Visit: Payer: Self-pay | Admitting: Internal Medicine

## 2023-03-17 ENCOUNTER — Telehealth: Payer: Self-pay | Admitting: Medical Oncology

## 2023-03-17 NOTE — Telephone Encounter (Signed)
L hip pain. Tylenol 500 mg qid does not help. I told her to call PCP for pain management . Mohamed wants to see Bone Survey results.   Bone scan - I gave pt phone number to call for Bone Survey appt.

## 2023-03-18 ENCOUNTER — Encounter: Payer: Self-pay | Admitting: Internal Medicine

## 2023-03-18 ENCOUNTER — Ambulatory Visit
Admission: RE | Admit: 2023-03-18 | Discharge: 2023-03-18 | Disposition: A | Discharge status: Home / Self Care | Payer: Medicare HMO | Source: Ambulatory Visit | Attending: Internal Medicine | Admitting: Internal Medicine

## 2023-03-18 ENCOUNTER — Ambulatory Visit: Payer: Medicare HMO | Admitting: Internal Medicine

## 2023-03-18 VITALS — BP 130/80 | Ht 65.0 in | Wt 204.0 lb

## 2023-03-18 DIAGNOSIS — M4316 Spondylolisthesis, lumbar region: Secondary | ICD-10-CM | POA: Diagnosis not present

## 2023-03-18 DIAGNOSIS — M47816 Spondylosis without myelopathy or radiculopathy, lumbar region: Secondary | ICD-10-CM

## 2023-03-18 DIAGNOSIS — M5417 Radiculopathy, lumbosacral region: Secondary | ICD-10-CM | POA: Diagnosis not present

## 2023-03-18 DIAGNOSIS — Z8579 Personal history of other malignant neoplasms of lymphoid, hematopoietic and related tissues: Secondary | ICD-10-CM

## 2023-03-18 MED ORDER — METHYLPREDNISOLONE 4 MG PO TABS: ORAL_TABLET | ORAL | 0 refills | Status: DC

## 2023-03-18 MED ORDER — HYDROCODONE-ACETAMINOPHEN 5-325 MG PO TABS: 1.0000 | ORAL_TABLET | Freq: Three times a day (TID) | ORAL | 0 refills | Status: AC | PRN

## 2023-03-18 MED ORDER — CYCLOBENZAPRINE HCL 10 MG PO TABS: 10.0000 mg | ORAL_TABLET | Freq: Three times a day (TID) | ORAL | 0 refills | Status: DC | PRN

## 2023-03-18 NOTE — Progress Notes (Signed)
Patient Care Team: [name], MD as PCP - General (Internal Medicine)  Visit Date: 03/18/23  Subjective:    Patient ID: Joanna Ray , Female   DOB: 1951/04/02, 72 y.o.    MRN: [id number]   72 y.o. Female presents today for left-sided lower back pain radiating into left gluteus and left leg. She has been taking cyclobenzaprine as needed without relief. Pain is worse when sitting and in the morning. History of plasmacytoma right humerus, smoldering multiple myeloma diagnosed February 2022 and followed by Dr. Arbutus Ped.  Past Medical History:  Diagnosis Date   Anxiety    Arthritis    Cancer (HCC) 2021   multiple myloma Right Arm   Cataract    GERD (gastroesophageal reflux disease)    Hyperlipidemia    Hypertension    Urticaria      Family History  Problem Relation Age of Onset   Stroke Mother    Stroke Father    Diabetes Sister    Breast cancer Sister    Colon cancer Neg Hx    Colon polyps Neg Hx    Esophageal cancer Neg Hx    Rectal cancer Neg Hx    Stomach cancer Neg Hx     Social history:     Review of Systems  Constitutional:  Negative for fever and malaise/fatigue.  HENT:  Negative for congestion.   Eyes:  Negative for blurred vision.  Respiratory:  Negative for cough and shortness of breath.   Cardiovascular:  Negative for chest pain, palpitations and leg swelling.  Gastrointestinal:  Negative for vomiting.  Musculoskeletal:  Positive for back pain (Left-lower).  Skin:  Negative for rash.  Neurological:  Negative for loss of consciousness and headaches.        Objective:   Vitals: BP 130/80   Ht 5\' 5"  (1.651 m)   Wt 204 lb (92.5 kg)   BMI 33.95 kg/m    Physical Exam Vitals and nursing note reviewed.  Constitutional:      General: She is not in acute distress.    Appearance: Normal appearance. She is not toxic-appearing.  HENT:     Head: Normocephalic and atraumatic.  Pulmonary:     Effort: Pulmonary effort is normal.   Musculoskeletal:     Comments: 5/5 muscle strength in left lower extremity.  Skin:    General: Skin is warm and dry.  Neurological:     Mental Status: She is alert and oriented to person, place, and time. Mental status is at baseline.     Deep Tendon Reflexes:     Reflex Scores:      Patellar reflexes are 1+ on the right side and 1+ on the left side.    Comments: Left straight leg raise test negative.  Psychiatric:        Mood and Affect: Mood normal.        Behavior: Behavior normal.        Thought Content: Thought content normal.        Judgment: Judgment normal.       Results:   Studies obtained and personally reviewed by me:   Labs:       Component Value Date/Time   NA 143 03/03/2023 0745   NA 145 (H) 12/28/2018 1436   K 4.2 03/03/2023 0745   CL 106 03/03/2023 0745   CO2 31 03/03/2023 0745   GLUCOSE 103 (H) 03/03/2023 0745   BUN 26 (H) 03/03/2023 0745   BUN 15 12/28/2018  1436   CREATININE 1.07 (H) 03/03/2023 0745   CREATININE 1.10 (H) 01/14/2023 0908   CALCIUM 9.1 03/03/2023 0745   PROT 8.0 03/03/2023 0745   PROT 7.7 12/28/2018 1436   ALBUMIN 3.8 03/03/2023 0745   ALBUMIN 4.6 12/28/2018 1436   AST 17 03/03/2023 0745   ALT 12 03/03/2023 0745   ALKPHOS 57 03/03/2023 0745   BILITOT 0.7 03/03/2023 0745   GFRNONAA 55 (L) 03/03/2023 0745   GFRNONAA 50 (L) 09/03/2020 0909   GFRAA 58 (L) 09/03/2020 0909     Lab Results  Component Value Date   WBC 5.7 03/03/2023   HGB 11.5 (L) 03/03/2023   HCT 35.3 (L) 03/03/2023   MCV 94.6 03/03/2023   PLT 242 03/03/2023    Lab Results  Component Value Date   CHOL 162 01/14/2023   HDL 54 01/14/2023   LDLCALC 92 01/14/2023   TRIG 71 01/14/2023   CHOLHDL 3.0 01/14/2023    Lab Results  Component Value Date   HGBA1C 5.7 (H) 09/03/2020     Lab Results  Component Value Date   TSH 1.77 01/14/2023      Assessment & Plan:   Left-sided lower back pain: ordered XR lumbar spine. Will place order for PT. Refilled  cyclobenzaprine.  May take 1 tablet up to 3 times daily as needed.  Also prescribed Medrol 4mg  6 day  tapering course  to take as directed, Norco 5-325 mg  one tab every eight hours as needed for moderate to severe pain.(#15  tabs) Will contact with results.  Addendum: Has severe spondylosis and facet hypertrophy within lower lumbar spine. Most notably L4-5 and L5-S1. This has progressed since prior study. SI joints are normal. No lytic lesions. Has referred her to [facility].  I,Alexander Ruley,acting as a Neurosurgeon for [name], MD.,have documented all relevant documentation on the behalf of [name], MD,as directed by  [name], MD while in the presence of [name], MD.   I, [name], MD, have reviewed all documentation for this visit. The documentation on 03/21/23 for the exam, diagnosis, procedures, and orders are all accurate and complete.

## 2023-03-19 NOTE — Progress Notes (Signed)
[facility] referral placed

## 2023-03-21 NOTE — Patient Instructions (Addendum)
LS-spine films show severe spondylosis and facet hypertrophy most notably at L4-L5 and L5-S1.  No lytic lesions or plasmacytoma identified.  SI joints are normal.  Patient has been referred to [facility] PT and she will take a tapering course of Medrol 4 mg starting with 6 tabs day 1 and decreasing by 1 tablet daily i.e. 6-5-4-3-2-1 taper.  May take Norco 5/325 (number 15 tablets) 1 tab every 8 hours as needed for moderate to severe pain.  May take Flexeril up to 3 times daily

## 2023-03-22 ENCOUNTER — Ambulatory Visit (HOSPITAL_COMMUNITY)
Admission: RE | Admit: 2023-03-22 | Discharge: 2023-03-22 | Disposition: A | Discharge status: Home / Self Care | Payer: Medicare HMO | Source: Ambulatory Visit | Attending: Internal Medicine | Admitting: Internal Medicine

## 2023-03-22 ENCOUNTER — Telehealth: Payer: Self-pay | Admitting: Internal Medicine

## 2023-03-22 DIAGNOSIS — Z8579 Personal history of other malignant neoplasms of lymphoid, hematopoietic and related tissues: Secondary | ICD-10-CM | POA: Insufficient documentation

## 2023-03-22 DIAGNOSIS — M25552 Pain in left hip: Secondary | ICD-10-CM | POA: Diagnosis not present

## 2023-03-22 DIAGNOSIS — C9000 Multiple myeloma not having achieved remission: Secondary | ICD-10-CM | POA: Diagnosis not present

## 2023-03-22 DIAGNOSIS — I700 Atherosclerosis of aorta: Secondary | ICD-10-CM | POA: Diagnosis not present

## 2023-03-22 NOTE — Telephone Encounter (Signed)
Copied from CRM [id number]. Topic: Referral - Question >> Mar 22, 2023  3:17 PM Deaijah H wrote: Reason for CRM: [facility] office called in to advise they're out of network with patient insurance

## 2023-03-22 NOTE — Telephone Encounter (Signed)
Called patient to let her know that where we sent the referral to for her physical therapy to was out of network for her insurance. She is going to call and find out who is in network and call back.

## 2023-03-23 ENCOUNTER — Telehealth: Payer: Self-pay

## 2023-03-23 NOTE — Telephone Encounter (Signed)
Referral placed.

## 2023-03-23 NOTE — Telephone Encounter (Signed)
Copied from CRM [id number]. Topic: Referral - Question >> Mar 23, 2023 10:35 AM Dimitri Ped wrote: Reason for CRM: patient calling to give doctor a physical therapist that she can be sent to. [facility] . [address] phone number [phone]

## 2023-04-01 ENCOUNTER — Ambulatory Visit
Admission: RE | Admit: 2023-04-01 | Discharge: 2023-04-01 | Disposition: A | Discharge status: Home / Self Care | Payer: Medicare HMO | Source: Ambulatory Visit | Attending: Internal Medicine | Admitting: Internal Medicine

## 2023-04-01 DIAGNOSIS — Z1231 Encounter for screening mammogram for malignant neoplasm of breast: Secondary | ICD-10-CM | POA: Diagnosis not present

## 2023-04-14 NOTE — Therapy (Signed)
OUTPATIENT PHYSICAL THERAPY THORACOLUMBAR EVALUATION   Patient Name: Joanna Ray MRN: [id number] DOB:1951-03-13, 72 y.o., female Today's Date: 04/16/2023  END OF SESSION:  PT End of Session - 04/16/23 0618     Visit Number 1    Number of Visits 13    Date for PT Re-Evaluation 06/04/23    Authorization Type [employer] MEDICARE HMO    PT Start Time 1510    PT Stop Time 1550    PT Time Calculation (min) 40 min    Activity Tolerance Patient tolerated treatment well    Behavior During Therapy [facility] for tasks assessed/performed             Past Medical History:  Diagnosis Date   Anxiety    Arthritis    Cancer (HCC) 2021   multiple myloma Right Arm   Cataract    GERD (gastroesophageal reflux disease)    Hyperlipidemia    Hypertension    Urticaria    Past Surgical History:  Procedure Laterality Date   CATARACT EXTRACTION, BILATERAL Bilateral    DILATION AND CURETTAGE OF UTERUS     Patient Active Problem List   Diagnosis Date Noted   Status post total shoulder arthroplasty, right 01/17/2023   History of plasmacytoma 01/17/2023   Angioedema 01/21/2019   Vertigo 09/28/2016   Hyperlipidemia 09/25/2011   Hypertension     PCP: [name], MD  REFERRING PROVIDER: [name], MD  REFERRING DIAG: M54.9 (ICD-10-CM) - Back pain   Rationale for Evaluation and Treatment: Rehabilitation  THERAPY DIAG:  Chronic bilateral low back pain with left-sided sciatica  Abnormal posture  Muscle weakness (generalized)  ONSET DATE: 2 months  SUBJECTIVE:                                                                                                                                                                                           SUBJECTIVE STATEMENT: Insidious onset of L low back pain approx 2 months ago which now radiates down the back of her L leg to the knee. Occasionally has tingling along the lateral aspect of her L calf.   PERTINENT HISTORY:  Hiigh  BMI, arthritis  PAIN:  Are you having pain? Yes: NPRS scale: 5/10. Pain range the last week prior to PT: 0-10/10 Pain location: L low back radiating L post LE to knee Pain description: ache Aggravating factors: Worrse when 1st wakes up, extended walking and standing greaterthan 5 mins Relieving factors: Hot soak in the tub, tylenol, muscle relaxer, sitting  PRECAUTIONS: None  RED FLAGS: None   WEIGHT BEARING RESTRICTIONS: No  FALLS:  Has patient fallen in  last 6 months? No  LIVING ENVIRONMENT: Lives with: lives with their family Lives in: House/apartment No issue accessing or with mobility in home  OCCUPATION: Part-time: Teach challenged adults  PLOF: Independent  PATIENT GOALS: To decrease the pain  NEXT MD VISIT: Next year, to call if continues to be an issue  OBJECTIVE:  Note: Objective measures were completed at Evaluation unless otherwise noted.  DIAGNOSTIC FINDINGS:  LUMBAR 03/18/23: IMPRESSION: 1. Progressive lower lumbar spondylosis and facet hypertrophy greatest at L4-5 and L5-S1. 2. Grade 1 degenerative anterolisthesis of L3 on L4. 3. No acute bony abnormality.  PATIENT SURVEYS:  FOTO: Perceived function  41%, predicted  55%   COGNITION: Overall cognitive status: Within functional limits for tasks assessed     SENSATION: WFL  MUSCLE LENGTH: Hamstrings: Right NT deg; Left NT deg Maisie Fus test: Right NT deg; Left NT deg  POSTURE:  Lateral R shift of shoulder on pelvis  PALPATION: Not significantly tender to palpation  LUMBAR ROM:   AROM eval  Flexion Full, p upon return  Extension Markedly limited, p  Right lateral flexion Full, no p  Left lateral flexion Marked limited, p  Right rotation Full, min p  Left rotation Min limited, p  P= concordant pain  (Blank rows = not tested)  LOWER EXTREMITY ROM:     Grossly WNLs Active  Right eval Left eval  Hip flexion    Hip extension    Hip abduction    Hip adduction    Hip internal rotation     Hip external rotation    Knee flexion    Knee extension    Ankle dorsiflexion    Ankle plantarflexion    Ankle inversion    Ankle eversion     (Blank rows = not tested)  LOWER EXTREMITY MMT:    Myotome screen negative equal bilat MMT Right eval Left eval  Hip flexion    Hip extension    Hip abduction    Hip adduction    Hip internal rotation    Hip external rotation    Knee flexion    Knee extension    Ankle dorsiflexion    Ankle plantarflexion    Ankle inversion    Ankle eversion     (Blank rows = not tested)  LUMBAR SPECIAL TESTS:  Straight leg raise test: Negative and Slump test: Negative  FUNCTIONAL TESTS:  NT  GAIT: Distance walked: 200' Assistive device utilized: None Level of assistance: Complete Independence Comments: Antalgic gait pattern over L LE  TREATMENT DATE:  [facility] Adult PT Treatment:                                                DATE: 04/15/23 Therapeutic Exercise: Developed, instructed in, and pt completed therex as noted in HEP  Self care Use of pillows under knees when sleeping supine to assess comfort level  PATIENT EDUCATION:  Education details: Eval findings, POC, HEP, self care  Person educated: Patient Education method: Explanation, Demonstration, Tactile cues, Verbal cues, and Handouts Education comprehension: verbalized understanding, returned demonstration, verbal cues required, and tactile cues required  HOME EXERCISE PROGRAM: Access Code: [email or web address] URL: [email or web address] Date: 04/15/2023 Prepared by: Joellyn Rued  Exercises - Seated Flexion Stretch with Swiss Ball  - 3 x daily - 7 x weekly - 1 sets - 10-20 reps - 5-20 hold - Hooklying Single Knee to Chest  - 3 x daily - 7 x weekly - 3 sets - 3-5 reps - 10-20 hold - Supine Transversus Abdominis Bracing - Hands on Stomach  - 2 x daily - 7 x  weekly - 3 sets - 10 reps - 3 hold  ASSESSMENT:  CLINICAL IMPRESSION: Patient is a 72 y.o. female who was seen today for physical therapy evaluation and treatment for M54.9 (ICD-10-CM) - Back pain. Pt presents with abnormal postural presentation with a R lateral shift of shoulder on pelvis. Pt reports she has not been told in the past she has an issue with scoliosis nor has past imaging indicated it. 03/18/23 Lumbar xray does reveal  progressive lower lumbar spondylosis and facet hypertrophy greatest at L4-5 and L5-S1, and Grade 1 degenerative anterolisthesis of L3 on L4. Pt's trunk motions are limited most significantly with ext and L SB, and her pain is increased with extended standing and walking. Pt will benefit from skilled PT 2w6 to address impairments to optimize back function with less pain.   OBJECTIVE IMPAIRMENTS: decreased activity tolerance, difficulty walking, decreased ROM, decreased strength, postural dysfunction, obesity, and pain.   ACTIVITY LIMITATIONS: carrying, lifting, bending, standing, sleeping, and locomotion level  PARTICIPATION LIMITATIONS: meal prep, cleaning, laundry, shopping, community activity, and occupation  PERSONAL FACTORS: Fitness, Past/current experiences, Time since onset of injury/illness/exacerbation, and 1 comorbidity: High BMI  are also affecting patient's functional outcome.   REHAB POTENTIAL: Good  CLINICAL DECISION MAKING: Evolving/moderate complexity  EVALUATION COMPLEXITY: Moderate   GOALS:  SHORT TERM GOALS: Target date: 05/06/22  Pt will be Ind in an initial HEP  Baseline:started Goal status: INITIAL  2.  Pt will voice understanding of measures to assist in pain reduction  Baseline: started Goal status: INITIAL   LONG TERM GOALS: Target date: 06/03/22  Pt will be Ind in a final HEP to maintain achieved LOF  Baseline: started Goal status: INITIAL  2.  Pt will report a 50% or greater decrease in low back pain for improved function,  sleep, and QOL Baseline: 0-10/10 Goal status: INITIAL  3.  Pt's trunk ext and L SB will improve to mod or less limitation for improved trunk function and as indication of improved pain Baseline: marked limitation Goal status: INITIAL  4.  Pt will report an increased standing and walking tolerance to 15 mins or greater for improved function Baseline: 5 mins Goal status: INITIAL  5.  Pt's FOTO score will improved to the predicted value of 55% as indication of improved function  Baseline: 41% Goal status: INITIAL  PLAN:  PT FREQUENCY: 2x/week  PT DURATION: 6 weeks  PLANNED INTERVENTIONS: 97164- PT Re-evaluation, 97110-Therapeutic exercises, 97530- Therapeutic activity, 97535- Self Care, 16109- Manual therapy, [id number]- Aquatic Therapy, [id number]- Electrical stimulation (manual), [id number]- Ultrasound, Patient/Family education, Taping, Dry Needling, Joint mobilization, Spinal mobilization, Cryotherapy, and Moist heat.  PLAN FOR NEXT SESSION: Review FOTO; assess response to HEP; progress therex as indicated; use of modalities, manual therapy; and TPDN as indicated.  Rien Marland  MS, PT 04/16/23 6:32 PM  Referring diagnosis? M54.9 (ICD-10-CM) - Back pain   Treatment diagnosis? (if different than referring diagnosis)  Chronic bilateral low back pain with left-sided sciatica Abnormal posture Muscle weakness (generalized)  What was this (referring dx) caused by? []  Surgery []  Fall [x]  Ongoing issue [x]  Arthritis []  Other: ____________  Laterality: []  Rt [x]  Lt []  Both  Check all possible CPT codes:  *CHOOSE 10 OR LESS*    See Planned Interventions listed in the Plan section of the Evaluation.

## 2023-04-15 ENCOUNTER — Ambulatory Visit: Payer: Medicare HMO | Attending: Internal Medicine

## 2023-04-15 ENCOUNTER — Other Ambulatory Visit: Payer: Self-pay

## 2023-04-15 DIAGNOSIS — M6281 Muscle weakness (generalized): Secondary | ICD-10-CM | POA: Insufficient documentation

## 2023-04-15 DIAGNOSIS — R293 Abnormal posture: Secondary | ICD-10-CM | POA: Diagnosis not present

## 2023-04-15 DIAGNOSIS — G8929 Other chronic pain: Secondary | ICD-10-CM | POA: Insufficient documentation

## 2023-04-15 DIAGNOSIS — M5442 Lumbago with sciatica, left side: Secondary | ICD-10-CM | POA: Diagnosis not present

## 2023-04-26 ENCOUNTER — Telehealth: Payer: Self-pay

## 2023-04-26 NOTE — Telephone Encounter (Signed)
LVM with patient about lab work for tomorrow.  Per Dr. Arbutus Ped last note, patient is due for labs the first of February. Sending schedule message about February appts because they are not made yet.

## 2023-04-27 ENCOUNTER — Inpatient Hospital Stay: Payer: Medicare HMO | Attending: Internal Medicine | Admitting: Internal Medicine

## 2023-04-27 ENCOUNTER — Inpatient Hospital Stay: Payer: Medicare HMO

## 2023-04-27 VITALS — BP 170/92 | HR 76 | Temp 97.3°F | Resp 17 | Ht 65.0 in | Wt 203.1 lb

## 2023-04-27 DIAGNOSIS — Z7952 Long term (current) use of systemic steroids: Secondary | ICD-10-CM | POA: Diagnosis not present

## 2023-04-27 DIAGNOSIS — R200 Anesthesia of skin: Secondary | ICD-10-CM | POA: Diagnosis not present

## 2023-04-27 DIAGNOSIS — Z79899 Other long term (current) drug therapy: Secondary | ICD-10-CM | POA: Diagnosis not present

## 2023-04-27 DIAGNOSIS — C9030 Solitary plasmacytoma not having achieved remission: Secondary | ICD-10-CM | POA: Insufficient documentation

## 2023-04-27 DIAGNOSIS — E785 Hyperlipidemia, unspecified: Secondary | ICD-10-CM | POA: Diagnosis not present

## 2023-04-27 DIAGNOSIS — K219 Gastro-esophageal reflux disease without esophagitis: Secondary | ICD-10-CM | POA: Insufficient documentation

## 2023-04-27 DIAGNOSIS — Z8579 Personal history of other malignant neoplasms of lymphoid, hematopoietic and related tissues: Secondary | ICD-10-CM

## 2023-04-27 DIAGNOSIS — C9000 Multiple myeloma not having achieved remission: Secondary | ICD-10-CM | POA: Diagnosis not present

## 2023-04-27 DIAGNOSIS — I10 Essential (primary) hypertension: Secondary | ICD-10-CM | POA: Insufficient documentation

## 2023-04-27 DIAGNOSIS — Z7982 Long term (current) use of aspirin: Secondary | ICD-10-CM | POA: Insufficient documentation

## 2023-04-27 LAB — CMP (CANCER CENTER ONLY)
ALT: 14 U/L (ref 0–44)
AST: 19 U/L (ref 15–41)
Albumin: 4.3 g/dL (ref 3.5–5.0)
Alkaline Phosphatase: 64 U/L (ref 38–126)
Anion gap: 6 (ref 5–15)
BUN: 20 mg/dL (ref 8–23)
CO2: 29 mmol/L (ref 22–32)
Calcium: 9.6 mg/dL (ref 8.9–10.3)
Chloride: 103 mmol/L (ref 98–111)
Creatinine: 1.08 mg/dL — ABNORMAL HIGH (ref 0.44–1.00)
GFR, Estimated: 55 mL/min — ABNORMAL LOW (ref >60)
Glucose, Bld: 90 mg/dL (ref 70–99)
Potassium: 3.6 mmol/L (ref 3.5–5.1)
Sodium: 138 mmol/L (ref 135–145)
Total Bilirubin: 0.9 mg/dL (ref 0.0–1.2)
Total Protein: 9.1 g/dL — ABNORMAL HIGH (ref 6.5–8.1)

## 2023-04-27 LAB — CBC WITH DIFFERENTIAL (CANCER CENTER ONLY)
Abs Immature Granulocytes: 0.09 10*3/uL — ABNORMAL HIGH (ref 0.00–0.07)
Basophils Absolute: 0.1 10*3/uL (ref 0.0–0.1)
Basophils Relative: 1 %
Eosinophils Absolute: 0.1 10*3/uL (ref 0.0–0.5)
Eosinophils Relative: 2 %
HCT: 36.9 % (ref 36.0–46.0)
Hemoglobin: 12.2 g/dL (ref 12.0–15.0)
Immature Granulocytes: 1 %
Lymphocytes Relative: 23 %
Lymphs Abs: 1.5 10*3/uL (ref 0.7–4.0)
MCH: 30.9 pg (ref 26.0–34.0)
MCHC: 33.1 g/dL (ref 30.0–36.0)
MCV: 93.4 fL (ref 80.0–100.0)
Monocytes Absolute: 0.6 10*3/uL (ref 0.1–1.0)
Monocytes Relative: 10 %
Neutro Abs: 4.1 10*3/uL (ref 1.7–7.7)
Neutrophils Relative %: 63 %
Platelet Count: 247 10*3/uL (ref 150–400)
RBC: 3.95 MIL/uL (ref 3.87–5.11)
RDW: 13.4 % (ref 11.5–15.5)
WBC Count: 6.5 10*3/uL (ref 4.0–10.5)
nRBC: 0 % (ref 0.0–0.2)

## 2023-04-27 LAB — LACTATE DEHYDROGENASE: LDH: 214 U/L — ABNORMAL HIGH (ref 98–192)

## 2023-04-27 NOTE — Progress Notes (Signed)
 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  PRIOR THERAPY:  1) Palliative radiotherapy to the plasmacytoma of the proximal right humerus under the care of Dr. Shannon. 2) Status post tumor resection from the proximal humerus with reconstruction under the care of Dr. Wash at [facility] on [facility].  CURRENT THERAPY: Observation  INTERVAL HISTORY: Joanna Ray 72 y.o. female returns to the clinic today for follow-up visit accompanied by her daughter.Discussed the use of AI scribe software for clinical note transcription with the patient, who gave verbal consent to proceed.  History of Present Illness   Joanna Ray, aged 72, was diagnosed with plasmacytoma in February 2022. The patient reported a slow recovery and a subsequent change in her condition in 2023. She has been under investigations since then, with occasional findings of elevated protein levels. A full body bone survey revealed tumor-like lesions in the left humerus, similar to those previously found on the right side. This has raised concerns of a progression to multiple myeloma.  The patient reported feeling generally well, with occasional soreness in the back and legs. She denied experiencing any unusual fatigue or tiredness. There was no reported pain in the left arm where the lytic lesion was identified.  The patient also reported numbness in the left leg upon walking, although no cause for this was identified in the bone scan.       MEDICAL HISTORY: Past Medical History:  Diagnosis Date   Anxiety    Arthritis    Cancer (HCC) 2021   multiple myloma Right Arm   Cataract    GERD (gastroesophageal reflux disease)    Hyperlipidemia    Hypertension     Urticaria     ALLERGIES:  is allergic to shellfish allergy.  MEDICATIONS:  Current Outpatient Medications  Medication Sig Dispense Refill   ALPRAZolam  (XANAX ) 0.5 MG tablet Take 1 tablet by mouth twice daily as needed for anxiety 60 tablet 5   aspirin  81 MG tablet Take 81 mg by mouth daily.     cholecalciferol  (VITAMIN D ) 1000 UNITS tablet Take 1,000 Units by mouth daily.     cyclobenzaprine  (FLEXERIL ) 10 MG tablet Take 1 tablet (10 mg total) by mouth 3 (three) times daily as needed for muscle spasms. 90 tablet 0   EPINEPHrine  (EPIPEN  2-PAK) 0.3 mg/0.3 mL IJ SOAJ injection Inject 0.3 mg into the muscle as needed for anaphylaxis. 1 each PRN   fexofenadine  (ALLEGRA ) 180 MG tablet Take 1 tablet (180 mg total) by mouth daily. 90 tablet 1   HYDROcodone  bit-homatropine (HYCODAN) 5-1.5 MG/5ML syrup Take 5 mLs by mouth every 8 (eight) hours as needed for cough. 120 mL 0   hydrocortisone  valerate cream (WESTCORT ) 0.2 % 1 app     meclizine  (ANTIVERT ) 25 MG tablet Take 1 tablet (25 mg total) by mouth 3 (three) times daily as needed for dizziness. 30 tablet 0   methylPREDNISolone  (MEDROL ) 4 MG tablet Take in tapering course as directed. Start with 6 tabs day 1 and decrease by one tab daily 6-5-4-3-2-1 taper for lumbar radiculopathy 21 tablet 0   rosuvastatin  (CRESTOR ) 20 MG tablet TAKE 1/2 TABLET ONE TIME DAILY 45 tablet 3   valsartan -hydrochlorothiazide  (DIOVAN -HCT) 160-25 MG tablet TAKE 1 TABLET EVERY DAY 90 tablet 3  vitamin E  100 UNIT capsule Take 100 Units by mouth daily.     No current facility-administered medications for this visit.    SURGICAL HISTORY:  Past Surgical History:  Procedure Laterality Date   CATARACT EXTRACTION, BILATERAL Bilateral    DILATION AND CURETTAGE OF UTERUS      REVIEW OF SYSTEMS:  Constitutional: positive for fatigue Eyes: negative Ears, nose, mouth, throat, and face: negative Respiratory: negative Cardiovascular: negative Gastrointestinal:  negative Genitourinary:negative Integument/breast: negative Hematologic/lymphatic: negative Musculoskeletal:positive for arthralgias and bone pain Neurological: negative Behavioral/Psych: negative Endocrine: negative Allergic/Immunologic: negative   PHYSICAL EXAMINATION: General appearance: alert, cooperative, and no distress Head: Normocephalic, without obvious abnormality, atraumatic Neck: no adenopathy, no JVD, supple, symmetrical, trachea midline, and thyroid  not enlarged, symmetric, no tenderness/mass/nodules Lymph nodes: Cervical, supraclavicular, and axillary nodes normal. Resp: clear to auscultation bilaterally Back: symmetric, no curvature. ROM normal. No CVA tenderness. Cardio: regular rate and rhythm, S1, S2 normal, no murmur, click, rub or gallop GI: soft, non-tender; bowel sounds normal; no masses,  no organomegaly Extremities: extremities normal, atraumatic, no cyanosis or edema and No edema Neurologic: Alert and oriented X 3, normal strength and tone. Normal symmetric reflexes. Normal coordination and gait  ECOG PERFORMANCE STATUS: 1 - Symptomatic but completely ambulatory  Blood pressure (!) 170/92, pulse 76, temperature (!) 97.3 F (36.3 C), temperature source Temporal, resp. rate 17, height 5' 5 (1.651 m), weight 203 lb 1.6 oz (92.1 kg), SpO2 100%.  LABORATORY DATA: Lab Results  Component Value Date   WBC 5.7 03/03/2023   HGB 11.5 (L) 03/03/2023   HCT 35.3 (L) 03/03/2023   MCV 94.6 03/03/2023   PLT 242 03/03/2023      Chemistry      Component Value Date/Time   NA 143 03/03/2023 0745   NA 145 (H) 12/28/2018 1436   K 4.2 03/03/2023 0745   CL 106 03/03/2023 0745   CO2 31 03/03/2023 0745   BUN 26 (H) 03/03/2023 0745   BUN 15 12/28/2018 1436   CREATININE 1.07 (H) 03/03/2023 0745   CREATININE 1.10 (H) 01/14/2023 0908      Component Value Date/Time   CALCIUM  9.1 03/03/2023 0745   ALKPHOS 57 03/03/2023 0745   AST 17 03/03/2023 0745   ALT 12 03/03/2023  0745   BILITOT 0.7 03/03/2023 0745       RADIOGRAPHIC STUDIES: MM 3D SCREENING MAMMOGRAM BILATERAL BREAST Result Date: 04/05/2023 CLINICAL DATA:  Screening. EXAM: DIGITAL SCREENING BILATERAL MAMMOGRAM WITH TOMOSYNTHESIS AND CAD TECHNIQUE: Bilateral screening digital craniocaudal and mediolateral oblique mammograms were obtained. Bilateral screening digital breast tomosynthesis was performed. The images were evaluated with computer-aided detection. COMPARISON:  Previous exam(s). ACR Breast Density Category b: There are scattered areas of fibroglandular density. FINDINGS: There are no findings suspicious for malignancy. IMPRESSION: No mammographic evidence of malignancy. A result letter of this screening mammogram will be mailed directly to the patient. RECOMMENDATION: Screening mammogram in one year. (Code:SM-B-01Y) BI-RADS CATEGORY  1: Negative. Electronically Signed   By: Rosaline Collet M.D.   On: 04/05/2023 09:59    ASSESSMENT AND PLAN: This is a very pleasant 71 years old African-American female with plasmacytoma of the proximal right humerus diagnosed in February 2022.  The patient had extensive work-up for multiple myeloma including a bone marrow biopsy and aspirate that showed only 6% plasma cells.  Her protein studies still suspicious for underlying MGUS/multiple myeloma but the findings are not enough to call it multiple myeloma at this point. The patient underwent radiotherapy to the plasmacytoma  of the proximal right humerus and tolerated the procedure fairly well.  She is currently on observation.  The skeletal bone survey showed no other lytic bone lesion except the proximal right humerus. The patient was treated with palliative radiotherapy in the past but recent skeletal bone survey showed progression of the lytic lesion in the proximal humeral diaphysis with pathologic fracture.  She underwent surgical resection of the tumor with reconstruction under the care of Dr. Wash at [facility] on May 01, 2021.   The patient was found on recent protein study to have elevated IgG level.  She underwent a bone marrow biopsy and aspirate that showed 10-20% plasma cells. She had skeletal bone survey performed recently that showed new lytic lesions in the proximal left femoral shaft and midshaft of the right femur consistent with myelomatous involvement or other lytic metastasis.  There was also scattered areas of endosteal scalloping in the left humeral shaft concerning for myelomatous involvement.    Multiple Myeloma Initially diagnosed with plasmacytoma in February 2022, with progression noted in 2023. Recent full body bone survey revealed lytic lesion in the left humerus, raising concern for multiple myeloma. Reports occasional soreness in back and legs, but no pain in the left arm. Discussed potential need for treatment due to risk of kidney damage, hypercalcemia, and other complications if untreated. Proposed treatment includes subcutaneous daratumumab  and Velcade , oral medication with Revlimid  and Decadron . If response to initial treatment is good, consider autologous stem cell transplant. Explained that autologous transplant uses her own cells, which are frozen and then reinfused. Discussed risks of untreated myeloma including kidney failure and hypercalcemia. - Order myeloma panel - Order PET scan to assess full body bone involvement - Review results in three weeks - Consider treatment regimen with daratumumab , Velcade , oral medication, and weekly steroids if protein levels continue to rise - Discuss potential autologous stem cell transplant if response to initial treatment is good  Follow-up - Schedule follow-up appointment in three weeks.   The patient was advised to call immediately if she has any other concerning symptoms in the interval. The patient voices understanding of current disease status and treatment options and is in agreement with the current care  plan.  All questions were answered. The patient knows to call the clinic with any problems, questions or concerns. We can certainly see the patient much sooner if necessary.  The total time spent in the appointment was 30 minutes.  Disclaimer: This note was dictated with voice recognition software. Similar sounding words can inadvertently be transcribed and may not be corrected upon review.

## 2023-04-28 LAB — BETA 2 MICROGLOBULIN, SERUM: Beta-2 Microglobulin: 2.3 mg/L (ref 0.6–2.4)

## 2023-04-29 ENCOUNTER — Telehealth: Payer: Self-pay

## 2023-04-29 ENCOUNTER — Ambulatory Visit: Payer: Medicare HMO | Attending: Internal Medicine

## 2023-04-29 DIAGNOSIS — G8929 Other chronic pain: Secondary | ICD-10-CM | POA: Insufficient documentation

## 2023-04-29 DIAGNOSIS — M5442 Lumbago with sciatica, left side: Secondary | ICD-10-CM | POA: Insufficient documentation

## 2023-04-29 DIAGNOSIS — M6281 Muscle weakness (generalized): Secondary | ICD-10-CM | POA: Insufficient documentation

## 2023-04-29 DIAGNOSIS — R293 Abnormal posture: Secondary | ICD-10-CM | POA: Insufficient documentation

## 2023-04-29 LAB — KAPPA/LAMBDA LIGHT CHAINS
Kappa free light chain: 203.5 mg/L — ABNORMAL HIGH (ref 3.3–19.4)
Kappa, lambda light chain ratio: 18.84 — ABNORMAL HIGH (ref 0.26–1.65)
Lambda free light chains: 10.8 mg/L (ref 5.7–26.3)

## 2023-04-29 NOTE — Telephone Encounter (Signed)
 Spoke to pt re: no show appt. Reviewed attendance policy and reminded pt of her next appt.

## 2023-05-03 ENCOUNTER — Encounter: Payer: Self-pay | Admitting: Physical Therapy

## 2023-05-03 ENCOUNTER — Ambulatory Visit: Payer: Medicare HMO | Admitting: Physical Therapy

## 2023-05-03 DIAGNOSIS — R293 Abnormal posture: Secondary | ICD-10-CM

## 2023-05-03 DIAGNOSIS — M5442 Lumbago with sciatica, left side: Secondary | ICD-10-CM | POA: Diagnosis not present

## 2023-05-03 DIAGNOSIS — M6281 Muscle weakness (generalized): Secondary | ICD-10-CM | POA: Diagnosis not present

## 2023-05-03 DIAGNOSIS — G8929 Other chronic pain: Secondary | ICD-10-CM

## 2023-05-03 NOTE — Therapy (Signed)
 OUTPATIENT PHYSICAL THERAPY THORACOLUMBAR TREATMENT    Patient Name: Joanna Ray MRN: [id number] DOB:08/08/1950, 73 y.o., female Today's Date: 05/03/2023  END OF SESSION:  PT End of Session - 05/03/23 1441     Visit Number 2    Number of Visits 13    Date for PT Re-Evaluation 06/04/23    Authorization Type [employer]    PT Start Time 0245    PT Stop Time 0327    PT Time Calculation (min) 42 min             Past Medical History:  Diagnosis Date   Anxiety    Arthritis    Cancer (HCC) 2021   multiple myloma Right Arm   Cataract    GERD (gastroesophageal reflux disease)    Hyperlipidemia    Hypertension    Urticaria    Past Surgical History:  Procedure Laterality Date   CATARACT EXTRACTION, BILATERAL Bilateral    DILATION AND CURETTAGE OF UTERUS     Patient Active Problem List   Diagnosis Date Noted   Status post total shoulder arthroplasty, right 01/17/2023   History of plasmacytoma 01/17/2023   Angioedema 01/21/2019   Vertigo 09/28/2016   Hyperlipidemia 09/25/2011   Hypertension     PCP: [name], MD  REFERRING PROVIDER: [name], MD  REFERRING DIAG: M54.9 (ICD-10-CM) - Back pain   Rationale for Evaluation and Treatment: Rehabilitation  THERAPY DIAG:  Chronic bilateral low back pain with left-sided sciatica  Abnormal posture  ONSET DATE: 2 months  SUBJECTIVE:                                                                                                                                                                                           SUBJECTIVE STATEMENT: The back is sore when I get up. I am getting over bronchitis.   EVAL: Insidious onset of L low back pain approx 2 months ago which now radiates down the back of her L leg to the knee. Occasionally has tingling along the lateral aspect of her L calf.   PERTINENT HISTORY:  Hiigh BMI, arthritis  PAIN:  Are you having pain? Yes: NPRS scale: 5/10.  Pain  location: L low back radiating L post LE to knee (today into hip)  Pain description: ache Aggravating factors: Worrse when 1st wakes up, extended walking and standing greaterthan 5 mins Relieving factors: Hot soak in the tub, tylenol , muscle relaxer, sitting  PRECAUTIONS: None  RED FLAGS: None   WEIGHT BEARING RESTRICTIONS: No  FALLS:  Has patient fallen in last 6 months? No  LIVING ENVIRONMENT: Lives with: lives  with their family Lives in: House/apartment No issue accessing or with mobility in home  OCCUPATION: Part-time: Teach challenged adults  PLOF: Independent  PATIENT GOALS: To decrease the pain  NEXT MD VISIT: Next year, to call if continues to be an issue  OBJECTIVE:  Note: Objective measures were completed at Evaluation unless otherwise noted.  DIAGNOSTIC FINDINGS:  LUMBAR 03/18/23: IMPRESSION: 1. Progressive lower lumbar spondylosis and facet hypertrophy greatest at L4-5 and L5-S1. 2. Grade 1 degenerative anterolisthesis of L3 on L4. 3. No acute bony abnormality.  PATIENT SURVEYS:  FOTO: Perceived function  41%, predicted  55%   COGNITION: Overall cognitive status: Within functional limits for tasks assessed     SENSATION: WFL  MUSCLE LENGTH: Hamstrings: Right NT deg; Left NT deg Debby test: Right NT deg; Left NT deg  POSTURE:  Lateral R shift of shoulder on pelvis  PALPATION: Not significantly tender to palpation  LUMBAR ROM:   AROM eval  Flexion Full, p upon return  Extension Markedly limited, p  Right lateral flexion Full, no p  Left lateral flexion Marked limited, p  Right rotation Full, min p  Left rotation Min limited, p  P= concordant pain  (Blank rows = not tested)  LOWER EXTREMITY ROM:     Grossly WNLs Active  Right eval Left eval  Hip flexion    Hip extension    Hip abduction    Hip adduction    Hip internal rotation    Hip external rotation    Knee flexion    Knee extension    Ankle dorsiflexion    Ankle  plantarflexion    Ankle inversion    Ankle eversion     (Blank rows = not tested)  LOWER EXTREMITY MMT:    Myotome screen negative equal bilat MMT Right eval Left eval  Hip flexion    Hip extension    Hip abduction    Hip adduction    Hip internal rotation    Hip external rotation    Knee flexion    Knee extension    Ankle dorsiflexion    Ankle plantarflexion    Ankle inversion    Ankle eversion     (Blank rows = not tested)  LUMBAR SPECIAL TESTS:  Straight leg raise test: Negative and Slump test: Negative  FUNCTIONAL TESTS:  NT  GAIT: Distance walked: 200' Assistive device utilized: None Level of assistance: Complete Independence Comments: Antalgic gait pattern over L LE  TREATMENT DATE:  [facility] Adult PT Treatment:                                                DATE: 05/03/23 Therapeutic Exercise: Seated lumbar flexion x 5 , added laterals x 3 each  SKTC  LTR  Supine PPT 5 sec x 10  Clam with GTB  Supine marching with GTB  Supine hamstring stretch with strap x 3 each - increased back pain so disc. Supine with ball for ab press 5 sec x 12  Supine bridge with legs over exercise ball  LTR with legs on ball Attempted h/s curl using exercise ball- increased pain   Supine figure 4     [facility] Adult PT Treatment:  DATE: 04/15/23 Therapeutic Exercise: Developed, instructed in, and pt completed therex as noted in HEP  Self care Use of pillows under knees when sleeping supine to assess comfort level                                                                                                                     PATIENT EDUCATION:  Education details: Eval findings, POC, HEP, self care  Person educated: Patient Education method: Explanation, Demonstration, Tactile cues, Verbal cues, and Handouts Education comprehension: verbalized understanding, returned demonstration, verbal cues required, and tactile cues required  HOME  EXERCISE PROGRAM: Access Code: [email or web address] URL: [email or web address] Date: 04/15/2023 Prepared by: Dasie Daft  Exercises - Seated Flexion Stretch with Swiss Ball  - 3 x daily - 7 x weekly - 1 sets - 10-20 reps - 5-20 hold - Hooklying Single Knee to Chest  - 3 x daily - 7 x weekly - 3 sets - 3-5 reps - 10-20 hold - Supine Transversus Abdominis Bracing - Hands on Stomach  - 2 x daily - 7 x weekly - 3 sets - 10 reps - 3 hold - Supine Lower Trunk Rotation  - 1 x daily - 7 x weekly - 1 sets - 10 reps - Supine March  - 1 x daily - 7 x weekly - 2 sets - 10 reps ASSESSMENT:  CLINICAL IMPRESSION: Pt reports she has tried the abdominal bracing exercise however has been sick and has not completed full HEP consistently. Her pain is worse in the mornings. Right now pain located right side of low back and into right hip. Reviewed HEP and progressed with core strength, ROM and hip stability. She had increased pain with supine hamstring stretches. She did well with exercise ball for core and gluteal activation. She had increased pain upon sitting up however pain decreased once she stood and walked around at end of session.     EVAL: Patient is a 73 y.o. female who was seen today for physical therapy evaluation and treatment for M54.9 (ICD-10-CM) - Back pain. Pt presents with abnormal postural presentation with a R lateral shift of shoulder on pelvis. Pt reports she has not been told in the past she has an issue with scoliosis nor has past imaging indicated it. 03/18/23 Lumbar xray does reveal  progressive lower lumbar spondylosis and facet hypertrophy greatest at L4-5 and L5-S1, and Grade 1 degenerative anterolisthesis of L3 on L4. Pt's trunk motions are limited most significantly with ext and L SB, and her pain is increased with extended standing and walking. Pt will benefit from skilled PT 2w6 to address impairments to optimize back function with less pain.   OBJECTIVE IMPAIRMENTS: decreased  activity tolerance, difficulty walking, decreased ROM, decreased strength, postural dysfunction, obesity, and pain.   ACTIVITY LIMITATIONS: carrying, lifting, bending, standing, sleeping, and locomotion level  PARTICIPATION LIMITATIONS: meal prep, cleaning, laundry, shopping, community activity, and occupation  PERSONAL FACTORS: Fitness, Past/current experiences, Time since onset of injury/illness/exacerbation, and 1 comorbidity: High BMI  are also affecting patient's functional outcome.   REHAB POTENTIAL: Good  CLINICAL DECISION MAKING: Evolving/moderate complexity  EVALUATION COMPLEXITY: Moderate   GOALS:  SHORT TERM GOALS: Target date: 05/06/22  Pt will be Ind in an initial HEP  Baseline:started Goal status: ONGOING  2.  Pt will voice understanding of measures to assist in pain reduction  Baseline: started Goal status: INITIAL   LONG TERM GOALS: Target date: 06/03/22  Pt will be Ind in a final HEP to maintain achieved LOF  Baseline: started Goal status: INITIAL  2.  Pt will report a 50% or greater decrease in low back pain for improved function, sleep, and QOL Baseline: 0-10/10 Goal status: INITIAL  3.  Pt's trunk ext and L SB will improve to mod or less limitation for improved trunk function and as indication of improved pain Baseline: marked limitation Goal status: INITIAL  4.  Pt will report an increased standing and walking tolerance to 15 mins or greater for improved function Baseline: 5 mins Goal status: INITIAL  5.  Pt's FOTO score will improved to the predicted value of 55% as indication of improved function  Baseline: 41% Goal status: INITIAL  PLAN:  PT FREQUENCY: 2x/week  PT DURATION: 6 weeks  PLANNED INTERVENTIONS: 97164- PT Re-evaluation, 97110-Therapeutic exercises, 97530- Therapeutic activity, 97535- Self Care, 02859- Manual therapy, [id number]- Aquatic Therapy, [id number]- Electrical stimulation (manual), [id number]- Ultrasound, Patient/Family education,  Taping, Dry Needling, Joint mobilization, Spinal mobilization, Cryotherapy, and Moist heat.  PLAN FOR NEXT SESSION: Review FOTO; assess response to HEP; progress therex as indicated; use of modalities, manual therapy; and TPDN as indicated.  Harlene Persons, PTA 05/03/23 3:43 PM Phone: [phone] Fax: [phone]

## 2023-05-05 ENCOUNTER — Encounter (HOSPITAL_COMMUNITY)
Admission: RE | Admit: 2023-05-05 | Discharge: 2023-05-05 | Disposition: A | Discharge status: Home / Self Care | Payer: Medicare HMO | Source: Ambulatory Visit | Attending: Internal Medicine | Admitting: Internal Medicine

## 2023-05-05 DIAGNOSIS — C9000 Multiple myeloma not having achieved remission: Secondary | ICD-10-CM | POA: Diagnosis not present

## 2023-05-05 LAB — GLUCOSE, CAPILLARY: Glucose-Capillary: 91 mg/dL (ref 70–99)

## 2023-05-05 MED ORDER — FLUDEOXYGLUCOSE F - 18 (FDG) INJECTION
10.1500 | Freq: Once | INTRAVENOUS | Status: AC
  Administered 2023-05-05: 10.11 via INTRAVENOUS

## 2023-05-06 ENCOUNTER — Ambulatory Visit: Payer: Medicare HMO

## 2023-05-06 DIAGNOSIS — G8929 Other chronic pain: Secondary | ICD-10-CM

## 2023-05-06 DIAGNOSIS — M6281 Muscle weakness (generalized): Secondary | ICD-10-CM | POA: Diagnosis not present

## 2023-05-06 DIAGNOSIS — R293 Abnormal posture: Secondary | ICD-10-CM

## 2023-05-06 DIAGNOSIS — M5442 Lumbago with sciatica, left side: Secondary | ICD-10-CM | POA: Diagnosis not present

## 2023-05-06 NOTE — Therapy (Signed)
 OUTPATIENT PHYSICAL THERAPY THORACOLUMBAR TREATMENT    Patient Name: Joanna Ray MRN: [id number] DOB:03-Nov-1950, 73 y.o., female Today's Date: 05/06/2023  END OF SESSION:  PT End of Session - 05/06/23 1510     Visit Number 3    Number of Visits 13    Date for PT Re-Evaluation 06/04/23    Authorization Type [employer] H[employer]    PT Start Time 0308    PT Stop Time 0346    PT Time Calculation (min) 38 min    Activity Tolerance Patient tolerated treatment well    Behavior During Therapy [facility] for tasks assessed/performed              Past Medical History:  Diagnosis Date   Anxiety    Arthritis    Cancer (HCC) 2021   multiple myloma Right Arm   Cataract    GERD (gastroesophageal reflux disease)    Hyperlipidemia    Hypertension    Urticaria    Past Surgical History:  Procedure Laterality Date   CATARACT EXTRACTION, BILATERAL Bilateral    DILATION AND CURETTAGE OF UTERUS     Patient Active Problem List   Diagnosis Date Noted   Status post total shoulder arthroplasty, right 01/17/2023   History of plasmacytoma 01/17/2023   Angioedema 01/21/2019   Vertigo 09/28/2016   Hyperlipidemia 09/25/2011   Hypertension     PCP: [name], MD  REFERRING PROVIDER: [name], MD  REFERRING DIAG: M54.9 (ICD-10-CM) - Back pain   Rationale for Evaluation and Treatment: Rehabilitation  THERAPY DIAG:  Chronic bilateral low back pain with left-sided sciatica  Abnormal posture  Muscle weakness (generalized)  ONSET DATE: 2 months  SUBJECTIVE:                                                                                                                                                                                           SUBJECTIVE STATEMENT: Pt reports her low back has been feeling some better.  EVAL: Insidious onset of L low back pain approx 2 months ago which now radiates down the back of her L leg to the knee. Occasionally has tingling along the  lateral aspect of her L calf.   PERTINENT HISTORY:  Hiigh BMI, arthritis  PAIN:  Are you having pain? Yes: NPRS scale: /10.  Pain location: L low back radiating L post LE to knee (today into hip)  Pain description: ache Aggravating factors: Worrse when 1st wakes up, extended walking and standing greaterthan 5 mins Relieving factors: Hot soak in the tub, tylenol , muscle relaxer, sitting  PRECAUTIONS: None  RED FLAGS: None   WEIGHT  BEARING RESTRICTIONS: No  FALLS:  Has patient fallen in last 6 months? No  LIVING ENVIRONMENT: Lives with: lives with their family Lives in: House/apartment No issue accessing or with mobility in home  OCCUPATION: Part-time: Teach challenged adults  PLOF: Independent  PATIENT GOALS: To decrease the pain  NEXT MD VISIT: Next year, to call if continues to be an issue  OBJECTIVE:  Note: Objective measures were completed at Evaluation unless otherwise noted.  DIAGNOSTIC FINDINGS:  LUMBAR 03/18/23: IMPRESSION: 1. Progressive lower lumbar spondylosis and facet hypertrophy greatest at L4-5 and L5-S1. 2. Grade 1 degenerative anterolisthesis of L3 on L4. 3. No acute bony abnormality.  PATIENT SURVEYS:  FOTO: Perceived function  41%, predicted  55%   COGNITION: Overall cognitive status: Within functional limits for tasks assessed     SENSATION: WFL  MUSCLE LENGTH: Hamstrings: Right NT deg; Left NT deg Debby test: Right NT deg; Left NT deg  POSTURE:  Lateral R shift of shoulder on pelvis  PALPATION: Not significantly tender to palpation  LUMBAR ROM:   AROM eval  Flexion Full, p upon return  Extension Markedly limited, p  Right lateral flexion Full, no p  Left lateral flexion Marked limited, p  Right rotation Full, min p  Left rotation Min limited, p  P= concordant pain  (Blank rows = not tested)  LOWER EXTREMITY ROM:     Grossly WNLs Active  Right eval Left eval  Hip flexion    Hip extension    Hip abduction    Hip  adduction    Hip internal rotation    Hip external rotation    Knee flexion    Knee extension    Ankle dorsiflexion    Ankle plantarflexion    Ankle inversion    Ankle eversion     (Blank rows = not tested)  LOWER EXTREMITY MMT:    Myotome screen negative equal bilat MMT Right eval Left eval  Hip flexion    Hip extension    Hip abduction    Hip adduction    Hip internal rotation    Hip external rotation    Knee flexion    Knee extension    Ankle dorsiflexion    Ankle plantarflexion    Ankle inversion    Ankle eversion     (Blank rows = not tested)  LUMBAR SPECIAL TESTS:  Straight leg raise test: Negative and Slump test: Negative  FUNCTIONAL TESTS:  NT  GAIT: Distance walked: 200' Assistive device utilized: None Level of assistance: Complete Independence Comments: Antalgic gait pattern over L LE  TREATMENT DATE:  [facility] Adult PT Treatment:                                                DATE: 05/06/23 Therapeutic Exercise: Standing R lateral hip shift x10 Seated trunk flexion forward and lateral  SKTC x2 LTR x5  Supine figure 4 3x20 each Supine PPT 5 sec x 10  Clam with GTB 2x10 Bridging x15 5 Supine marching with GTB 2x10  [facility] Adult PT Treatment:                                                DATE: 05/03/23 Therapeutic Exercise: Seated  lumbar flexion x 5 , added laterals x 3 each  SKTC  LTR  Supine PPT 5 sec x 10  Clam with GTB  Supine marching with GTB  Supine hamstring stretch with strap x 3 each - increased back pain so disc. Supine with ball for ab press 5 sec x 12  Supine bridge with legs over exercise ball  LTR with legs on ball Attempted h/s curl using exercise ball- increased pain   Supine figure 4   [facility] Adult PT Treatment:                                                DATE: 04/15/23 Therapeutic Exercise: Developed, instructed in, and pt completed therex as noted in HEP  Self care Use of pillows under knees when sleeping supine to assess  comfort level                                                                                                                     PATIENT EDUCATION:  Education details: Eval findings, POC, HEP, self care  Person educated: Patient Education method: Explanation, Demonstration, Tactile cues, Verbal cues, and Handouts Education comprehension: verbalized understanding, returned demonstration, verbal cues required, and tactile cues required  HOME EXERCISE PROGRAM: Access Code: [email or web address] URL: [email or web address] Date: 05/06/2023 Prepared by: Dasie Daft  Exercises - Right Standing Lateral Shift Correction at Wall - Hold  - 2-3 x daily - 7 x weekly - 1 sets - 10 reps - Seated Flexion Stretch with Swiss Ball  - 3 x daily - 7 x weekly - 1 sets - 10-20 reps - 5-20 hold - Hooklying Single Knee to Chest  - 3 x daily - 7 x weekly - 3 sets - 3-5 reps - 10-20 hold - Supine Lower Trunk Rotation  - 1 x daily - 7 x weekly - 1 sets - 10 reps - Supine Figure 4 Piriformis Stretch  - 1 x daily - 7 x weekly - 3 sets - 10 reps - Supine Transversus Abdominis Bracing - Hands on Stomach  - 2 x daily - 7 x weekly - 3 sets - 10 reps - 3 hold - Supine March  - 1 x daily - 7 x weekly - 2 sets - 10 reps - Supine Bridge  - 1 x daily - 7 x weekly - 2 sets - 10 reps - 5 hold - Hooklying Clamshell with Resistance  - 1 x daily - 7 x weekly - 2 sets - 10 reps - 3 hold  ASSESSMENT:  CLINICAL IMPRESSION: PT was completed for lumbopelvic flexibility and core strengthening. Standing lateral shifting, hip flexor stretch and abdominal therex were added to today's session and the pt's HEP. Pt reported her low back felt much better at the end of session. Pt tolerated the prescribed therex today without adverse effects. Pt will continue to benefit  from skilled PT to address impairments for improved back function with minimized pain.    EVAL: Patient is a 73 y.o. female who was seen today for physical therapy evaluation  and treatment for M54.9 (ICD-10-CM) - Back pain. Pt presents with abnormal postural presentation with a R lateral shift of shoulder on pelvis. Pt reports she has not been told in the past she has an issue with scoliosis nor has past imaging indicated it. 03/18/23 Lumbar xray does reveal  progressive lower lumbar spondylosis and facet hypertrophy greatest at L4-5 and L5-S1, and Grade 1 degenerative anterolisthesis of L3 on L4. Pt's trunk motions are limited most significantly with ext and L SB, and her pain is increased with extended standing and walking. Pt will benefit from skilled PT 2w6 to address impairments to optimize back function with less pain.   OBJECTIVE IMPAIRMENTS: decreased activity tolerance, difficulty walking, decreased ROM, decreased strength, postural dysfunction, obesity, and pain.   ACTIVITY LIMITATIONS: carrying, lifting, bending, standing, sleeping, and locomotion level  PARTICIPATION LIMITATIONS: meal prep, cleaning, laundry, shopping, community activity, and occupation  PERSONAL FACTORS: Fitness, Past/current experiences, Time since onset of injury/illness/exacerbation, and 1 comorbidity: High BMI  are also affecting patient's functional outcome.   REHAB POTENTIAL: Good  CLINICAL DECISION MAKING: Evolving/moderate complexity  EVALUATION COMPLEXITY: Moderate   GOALS:  SHORT TERM GOALS: Target date: 05/06/22  Pt will be Ind in an initial HEP  Baseline:started Goal status: ONGOING  2.  Pt will voice understanding of measures to assist in pain reduction  Baseline: started Goal status: INITIAL   LONG TERM GOALS: Target date: 06/03/22  Pt will be Ind in a final HEP to maintain achieved LOF  Baseline: started Goal status: INITIAL  2.  Pt will report a 50% or greater decrease in low back pain for improved function, sleep, and QOL Baseline: 0-10/10 Goal status: INITIAL  3.  Pt's trunk ext and L SB will improve to mod or less limitation for improved trunk function  and as indication of improved pain Baseline: marked limitation Goal status: INITIAL  4.  Pt will report an increased standing and walking tolerance to 15 mins or greater for improved function Baseline: 5 mins Goal status: INITIAL  5.  Pt's FOTO score will improved to the predicted value of 55% as indication of improved function  Baseline: 41% Goal status: INITIAL  PLAN:  PT FREQUENCY: 2x/week  PT DURATION: 6 weeks  PLANNED INTERVENTIONS: 97164- PT Re-evaluation, 97110-Therapeutic exercises, 97530- Therapeutic activity, 97535- Self Care, 02859- Manual therapy, [id number]- Aquatic Therapy, [id number]- Electrical stimulation (manual), [id number]- Ultrasound, Patient/Family education, Taping, Dry Needling, Joint mobilization, Spinal mobilization, Cryotherapy, and Moist heat.  PLAN FOR NEXT SESSION: Review FOTO; assess response to HEP; progress therex as indicated; use of modalities, manual therapy; and TPDN as indicated.  Lawrence Roldan MS, PT 05/06/23 6:02 PM

## 2023-05-07 LAB — MULTIPLE MYELOMA PANEL, SERUM
Albumin SerPl Elph-Mcnc: 3.9 g/dL (ref 2.9–4.4)
Albumin/Glob SerPl: 1 (ref 0.7–1.7)
Alpha 1: 0.3 g/dL (ref 0.0–0.4)
Alpha2 Glob SerPl Elph-Mcnc: 0.7 g/dL (ref 0.4–1.0)
B-Globulin SerPl Elph-Mcnc: 0.9 g/dL (ref 0.7–1.3)
Gamma Glob SerPl Elph-Mcnc: 2.3 g/dL — ABNORMAL HIGH (ref 0.4–1.8)
Globulin, Total: 4.2 g/dL — ABNORMAL HIGH (ref 2.2–3.9)
IgA: 107 mg/dL (ref 64–422)
IgG (Immunoglobin G), Serum: 3026 mg/dL — ABNORMAL HIGH (ref 586–1602)
IgM (Immunoglobulin M), Srm: 68 mg/dL (ref 26–217)
M Protein SerPl Elph-Mcnc: 1.8 g/dL — ABNORMAL HIGH
Total Protein ELP: 8.1 g/dL (ref 6.0–8.5)

## 2023-05-11 ENCOUNTER — Ambulatory Visit: Payer: Medicare HMO

## 2023-05-13 ENCOUNTER — Ambulatory Visit: Payer: Medicare HMO | Admitting: Physical Therapy

## 2023-05-13 ENCOUNTER — Encounter: Payer: Self-pay | Admitting: Physical Therapy

## 2023-05-13 DIAGNOSIS — M5442 Lumbago with sciatica, left side: Secondary | ICD-10-CM | POA: Diagnosis not present

## 2023-05-13 DIAGNOSIS — G8929 Other chronic pain: Secondary | ICD-10-CM

## 2023-05-13 DIAGNOSIS — R293 Abnormal posture: Secondary | ICD-10-CM

## 2023-05-13 DIAGNOSIS — M6281 Muscle weakness (generalized): Secondary | ICD-10-CM | POA: Diagnosis not present

## 2023-05-13 NOTE — Therapy (Signed)
OUTPATIENT PHYSICAL THERAPY THORACOLUMBAR TREATMENT    Patient Name: Joanna Ray MRN: [id number] DOB:1950-08-17, 73 y.o., female Today's Date: 05/13/2023  END OF SESSION:  PT End of Session - 05/13/23 1445     Visit Number 4    Number of Visits 13    Date for PT Re-Evaluation 06/04/23    Authorization Type [employer] MEDIC[employer] HMO    PT Start Time 0245    PT Stop Time 0325    PT Time Calculation (min) 40 min              Past Medical History:  Diagnosis Date   Anxiety    Arthritis    Cancer (HCC) 2021   multiple myloma Right Arm   Cataract    GERD (gastroesophageal reflux disease)    Hyperlipidemia    Hypertension    Urticaria    Past Surgical History:  Procedure Laterality Date   CATARACT EXTRACTION, BILATERAL Bilateral    DILATION AND CURETTAGE OF UTERUS     Patient Active Problem List   Diagnosis Date Noted   Status post total shoulder arthroplasty, right 01/17/2023   History of plasmacytoma 01/17/2023   Angioedema 01/21/2019   Vertigo 09/28/2016   Hyperlipidemia 09/25/2011   Hypertension     PCP: [name], MD  REFERRING PROVIDER: [name], MD  REFERRING DIAG: M54.9 (ICD-10-CM) - Back pain   Rationale for Evaluation and Treatment: Rehabilitation  THERAPY DIAG:  Chronic bilateral low back pain with left-sided sciatica  Abnormal posture  ONSET DATE: 2 months  SUBJECTIVE:                                                                                                                                                                                           SUBJECTIVE STATEMENT: I feel good. 3/10 low back, no hip or leg pain.  The key is to be slow in the morning and stretch, and get up every 30 minutes.   EVAL: Insidious onset of L low back pain approx 2 months ago which now radiates down the back of her L leg to the knee. Occasionally has tingling along the lateral aspect of her L calf.   PERTINENT HISTORY:  Hiigh BMI,  arthritis  PAIN:  Are you having pain? Yes: NPRS scale: 3/10.  Pain location: L low back  Pain description: ache Aggravating factors: Worrse when 1st wakes up, extended walking and standing greaterthan 5 mins Relieving factors: Hot soak in the tub, tylenol, muscle relaxer, sitting  PRECAUTIONS: None  RED FLAGS: None   WEIGHT BEARING RESTRICTIONS: No  FALLS:  Has patient fallen in last 6  months? No  LIVING ENVIRONMENT: Lives with: lives with their family Lives in: House/apartment No issue accessing or with mobility in home  OCCUPATION: Part-time: Teach challenged adults  PLOF: Independent  PATIENT GOALS: To decrease the pain  NEXT MD VISIT: Next year, to call if continues to be an issue  OBJECTIVE:  Note: Objective measures were completed at Evaluation unless otherwise noted.  DIAGNOSTIC FINDINGS:  LUMBAR 03/18/23: IMPRESSION: 1. Progressive lower lumbar spondylosis and facet hypertrophy greatest at L4-5 and L5-S1. 2. Grade 1 degenerative anterolisthesis of L3 on L4. 3. No acute bony abnormality.  PATIENT SURVEYS:  FOTO: Perceived function  41%, predicted  55%   COGNITION: Overall cognitive status: Within functional limits for tasks assessed     SENSATION: WFL  MUSCLE LENGTH: Hamstrings: Right NT deg; Left NT deg Maisie Fus test: Right NT deg; Left NT deg  POSTURE:  Lateral R shift of shoulder on pelvis  PALPATION: Not significantly tender to palpation  LUMBAR ROM:   AROM eval  Flexion Full, p upon return  Extension Markedly limited, p  Right lateral flexion Full, no p  Left lateral flexion Marked limited, p  Right rotation Full, min p  Left rotation Min limited, p  P= concordant pain  (Blank rows = not tested)  LOWER EXTREMITY ROM:     Grossly WNLs Active  Right eval Left eval  Hip flexion    Hip extension    Hip abduction    Hip adduction    Hip internal rotation    Hip external rotation    Knee flexion    Knee extension    Ankle  dorsiflexion    Ankle plantarflexion    Ankle inversion    Ankle eversion     (Blank rows = not tested)  LOWER EXTREMITY MMT:    Myotome screen negative equal bilat MMT Right eval Left eval  Hip flexion    Hip extension    Hip abduction    Hip adduction    Hip internal rotation    Hip external rotation    Knee flexion    Knee extension    Ankle dorsiflexion    Ankle plantarflexion    Ankle inversion    Ankle eversion     (Blank rows = not tested)  LUMBAR SPECIAL TESTS:  Straight leg raise test: Negative and Slump test: Negative  FUNCTIONAL TESTS:  NT  GAIT: Distance walked: 200' Assistive device utilized: None Level of assistance: Complete Independence Comments: Antalgic gait pattern over L LE  TREATMENT DATE:  [facility] Adult PT Treatment:                                                DATE: 05/13/23 Therapeutic Exercise: Nustep L3 UE/LE x 5 minutes Seated lumbar flexion forward and lateral with ball  PPT x 10 Clam with GTB  Bridge with GTB 10 x 2  Supine PPT with GTB marching  SKTC x 4  LTR x 10 Supine Hip flexor stretch Right - upon sitting up pt experienced pain into right buttock  SKTC  Supine figure 4 push and pull bilat   [facility] Adult PT Treatment:  DATE: 05/06/23 Therapeutic Exercise: Standing R lateral hip shift x10 Seated trunk flexion forward and lateral  SKTC x2 LTR x5  Supine figure 4 3x20 each Supine PPT 5 sec x 10  Clam with GTB 2x10 Bridging x15 5" Supine marching with GTB 2x10  [facility] Adult PT Treatment:                                                DATE: 05/03/23 Therapeutic Exercise: Seated lumbar flexion x 5 , added laterals x 3 each  SKTC  LTR  Supine PPT 5 sec x 10  Clam with GTB  Supine marching with GTB  Supine hamstring stretch with strap x 3 each - increased back pain so disc. Supine with ball for ab press 5 sec x 12  Supine bridge with legs over exercise ball  LTR with legs on  ball Attempted h/s curl using exercise ball- increased pain   Supine figure 4   [facility] Adult PT Treatment:                                                DATE: 04/15/23 Therapeutic Exercise: Developed, instructed in, and pt completed therex as noted in HEP  Self care Use of pillows under knees when sleeping supine to assess comfort level                                                                                                                     PATIENT EDUCATION:  Education details: Eval findings, POC, HEP, self care  Person educated: Patient Education method: Explanation, Demonstration, Tactile cues, Verbal cues, and Handouts Education comprehension: verbalized understanding, returned demonstration, verbal cues required, and tactile cues required  HOME EXERCISE PROGRAM: Access Code: [email or web address] URL: [email or web address] Date: 05/06/2023 Prepared by: Joellyn Rued  Exercises - Right Standing Lateral Shift Correction at Wall - Hold  - 2-3 x daily - 7 x weekly - 1 sets - 10 reps - Seated Flexion Stretch with Swiss Ball  - 3 x daily - 7 x weekly - 1 sets - 10-20 reps - 5-20 hold - Hooklying Single Knee to Chest  - 3 x daily - 7 x weekly - 3 sets - 3-5 reps - 10-20 hold - Supine Lower Trunk Rotation  - 1 x daily - 7 x weekly - 1 sets - 10 reps - Supine Figure 4 Piriformis Stretch  - 1 x daily - 7 x weekly - 3 sets - 10 reps - Supine Transversus Abdominis Bracing - Hands on Stomach  - 2 x daily - 7 x weekly - 3 sets - 10 reps - 3 hold - Supine March  - 1 x daily - 7 x weekly - 2 sets - 10 reps -  Supine Bridge  - 1 x daily - 7 x weekly - 2 sets - 10 reps - 5 hold - Hooklying Clamshell with Resistance  - 1 x daily - 7 x weekly - 2 sets - 10 reps - 3 hold  ASSESSMENT:  CLINICAL IMPRESSION: PT was completed for lumbopelvic flexibility and core strengthening. Pt reports improved morning pain using pacing strategies and stretches. She also notes decreased pain when she takes  standing breaks every 30 minutes. Her standing tolerance has improved from 5 min to 10 min however walking tolerance is unchanged (limited to 5 min). She is independent with HEP. STG# 1 , #2 met. Overall pt reports improvement with PT and is progressing toward remaining goals. Continued with review of entire HEP with pt tolerating well. After R hip flexor stretch, pt experienced increased right buttock pain upon sitting up rated at 9/10. Her symptoms were reduced with flexion stretches. Did not completely resolve. Pain on exit 4/10.    EVAL: Patient is a 73 y.o. female who was seen today for physical therapy evaluation and treatment for M54.9 (ICD-10-CM) - Back pain. Pt presents with abnormal postural presentation with a R lateral shift of shoulder on pelvis. Pt reports she has not been told in the past she has an issue with scoliosis nor has past imaging indicated it. 03/18/23 Lumbar xray does reveal  progressive lower lumbar spondylosis and facet hypertrophy greatest at L4-5 and L5-S1, and Grade 1 degenerative anterolisthesis of L3 on L4. Pt's trunk motions are limited most significantly with ext and L SB, and her pain is increased with extended standing and walking. Pt will benefit from skilled PT 2w6 to address impairments to optimize back function with less pain.   OBJECTIVE IMPAIRMENTS: decreased activity tolerance, difficulty walking, decreased ROM, decreased strength, postural dysfunction, obesity, and pain.   ACTIVITY LIMITATIONS: carrying, lifting, bending, standing, sleeping, and locomotion level  PARTICIPATION LIMITATIONS: meal prep, cleaning, laundry, shopping, community activity, and occupation  PERSONAL FACTORS: Fitness, Past/current experiences, Time since onset of injury/illness/exacerbation, and 1 comorbidity: High BMI  are also affecting patient's functional outcome.   REHAB POTENTIAL: Good  CLINICAL DECISION MAKING: Evolving/moderate complexity  EVALUATION COMPLEXITY:  Moderate   GOALS:  SHORT TERM GOALS: Target date: 05/06/22  Pt will be Ind in an initial HEP  Baseline:started Goal status: MET  2.  Pt will voice understanding of measures to assist in pain reduction  Baseline: started 05/13/23: pacing, breaks, HEP Goal status: MET   LONG TERM GOALS: Target date: 06/03/22  Pt will be Ind in a final HEP to maintain achieved LOF  Baseline: started Goal status: INITIAL  2.  Pt will report a 50% or greater decrease in low back pain for improved function, sleep, and QOL Baseline: 0-10/10 05/13/23: pt reports improved morning pain with HEP and pacing.  Goal status: INITIAL  3.  Pt's trunk ext and L SB will improve to mod or less limitation for improved trunk function and as indication of improved pain Baseline: marked limitation Goal status: INITIAL  4.  Pt will report an increased standing and walking tolerance to 15 mins or greater for improved function Baseline: 5 mins 05/13/23: standing 10 min, walking 5 min still  Goal status: ONGOING  5.  Pt's FOTO score will improved to the predicted value of 55% as indication of improved function  Baseline: 41% Goal status: INITIAL  PLAN:  PT FREQUENCY: 2x/week  PT DURATION: 6 weeks  PLANNED INTERVENTIONS: 97164- PT Re-evaluation, 97110-Therapeutic exercises, 97530- Therapeutic  activity, 97535- Self Care, 19147- Manual therapy, [id number]- Aquatic Therapy, [phone]- Electrical stimulation (manual), [id number]- Ultrasound, Patient/Family education, Taping, Dry Needling, Joint mobilization, Spinal mobilization, Cryotherapy, and Moist heat.  PLAN FOR NEXT SESSION: Review FOTO; assess response to HEP; progress therex as indicated; use of modalities, manual therapy; and TPDN as indicated.  Jannette Spanner, PTA 05/13/23 3:39 PM Phone: [phone] Fax: [phone]

## 2023-05-17 ENCOUNTER — Encounter: Payer: Self-pay | Admitting: Physical Therapy

## 2023-05-17 ENCOUNTER — Ambulatory Visit: Payer: Medicare HMO | Admitting: Physical Therapy

## 2023-05-17 DIAGNOSIS — R293 Abnormal posture: Secondary | ICD-10-CM | POA: Diagnosis not present

## 2023-05-17 DIAGNOSIS — G8929 Other chronic pain: Secondary | ICD-10-CM | POA: Diagnosis not present

## 2023-05-17 DIAGNOSIS — M6281 Muscle weakness (generalized): Secondary | ICD-10-CM | POA: Diagnosis not present

## 2023-05-17 DIAGNOSIS — M5442 Lumbago with sciatica, left side: Secondary | ICD-10-CM | POA: Diagnosis not present

## 2023-05-17 NOTE — Therapy (Signed)
OUTPATIENT PHYSICAL THERAPY THORACOLUMBAR TREATMENT    Patient Name: Joanna Ray MRN: [id number] DOB:10/17/1950, 73 y.o., female Today's Date: 05/17/2023  END OF SESSION:  PT End of Session - 05/17/23 1456     Visit Number 5    Number of Visits 13    Date for PT Re-Evaluation 06/04/23    Authorization Type [employer]IC[employer] HMO    PT Start Time 1500    PT Stop Time 1545    PT Time Calculation (min) 45 min    Activity Tolerance Patient tolerated treatment well    Behavior During Therapy [facility] for tasks assessed/performed               Past Medical History:  Diagnosis Date   Anxiety    Arthritis    Cancer (HCC) 2021   multiple myloma Right Arm   Cataract    GERD (gastroesophageal reflux disease)    Hyperlipidemia    Hypertension    Urticaria    Past Surgical History:  Procedure Laterality Date   CATARACT EXTRACTION, BILATERAL Bilateral    DILATION AND CURETTAGE OF UTERUS     Patient Active Problem List   Diagnosis Date Noted   Status post total shoulder arthroplasty, right 01/17/2023   History of plasmacytoma 01/17/2023   Angioedema 01/21/2019   Vertigo 09/28/2016   Hyperlipidemia 09/25/2011   Hypertension     PCP: [name], MD  REFERRING PROVIDER: [name], MD  REFERRING DIAG: M54.9 (ICD-10-CM) - Back pain   Rationale for Evaluation and Treatment: Rehabilitation  THERAPY DIAG:  Chronic bilateral low back pain with left-sided sciatica  Abnormal posture  Muscle weakness (generalized)  ONSET DATE: 2 months  SUBJECTIVE:                                                                                                                                                                                           SUBJECTIVE STATEMENT: No pain right now.  Sees her MD early March.  Loves her daily HEP routine.     EVAL: Insidious onset of L low back pain approx 2 months ago which now radiates down the back of her L leg to the knee.  Occasionally has tingling along the lateral aspect of her L calf.   PERTINENT HISTORY:  High BMI, arthritis  PAIN:  Are you having pain? Yes: NPRS scale: none/10.  Pain location: L low back  Pain description: ache Aggravating factors: Worse when 1st wakes up, extended walking and standing greaterthan 5 mins Relieving factors: Hot soak in the tub, tylenol, muscle relaxer, sitting  PRECAUTIONS: None  RED FLAGS: None  WEIGHT BEARING RESTRICTIONS: No  FALLS:  Has patient fallen in last 6 months? No  LIVING ENVIRONMENT: Lives with: lives with their family Lives in: House/apartment No issue accessing or with mobility in home  OCCUPATION: Part-time: Teach challenged adults, used to work at [employer]   PLOF: Independent  PATIENT GOALS: To decrease the pain. I want to be able to walk with less pain, no pain ! Grocery store, work.   NEXT MD VISIT: March 2025.   OBJECTIVE:  Note: Objective measures were completed at Evaluation unless otherwise noted.  DIAGNOSTIC FINDINGS:  LUMBAR 03/18/23: IMPRESSION: 1. Progressive lower lumbar spondylosis and facet hypertrophy greatest at L4-5 and L5-S1. 2. Grade 1 degenerative anterolisthesis of L3 on L4. 3. No acute bony abnormality.  PATIENT SURVEYS:  FOTO: Perceived function  41%, predicted  55%   COGNITION: Overall cognitive status: Within functional limits for tasks assessed     SENSATION: WFL  MUSCLE LENGTH: Hamstrings: Right NT deg; Left NT deg Maisie Fus test: Right NT deg; Left NT deg  POSTURE:  Lateral R shift of shoulder on pelvis  PALPATION: Not significantly tender to palpation  LUMBAR ROM:   AROM eval  Flexion Full, p upon return  Extension Markedly limited, p  Right lateral flexion Full, no p  Left lateral flexion Marked limited, p  Right rotation Full, min p  Left rotation Min limited, p  P= concordant pain  (Blank rows = not tested)  LOWER EXTREMITY ROM:     Grossly WNLs Active  Right eval Left eval   Hip flexion    Hip extension    Hip abduction    Hip adduction    Hip internal rotation    Hip external rotation    Knee flexion    Knee extension    Ankle dorsiflexion    Ankle plantarflexion    Ankle inversion    Ankle eversion     (Blank rows = not tested)  LOWER EXTREMITY MMT:    Myotome screen negative equal bilat MMT Right eval Left eval  Hip flexion    Hip extension    Hip abduction    Hip adduction    Hip internal rotation    Hip external rotation    Knee flexion    Knee extension    Ankle dorsiflexion    Ankle plantarflexion    Ankle inversion    Ankle eversion     (Blank rows = not tested)  LUMBAR SPECIAL TESTS:  Straight leg raise test: Negative and Slump test: Negative  FUNCTIONAL TESTS:  NT  GAIT: Distance walked: 200' Assistive device utilized: None Level of assistance: Complete Independence Comments: Antalgic gait pattern over L LE  TREATMENT DATE:   [facility] Adult PT Treatment:                                                DATE: 05/17/23 Therapeutic Exercise: Nustep L5 UE/LE x 6 minutes Seated lumbar flexion forward and lateral with ball  PPT x 10 Added arm press into the Pilates circle on thighs  Partial bridge with Pilates circle  SLR with Circle x 10  Piriformis knee push and pull x 5 each side  Sit to stand 5 lbs x 10 , tried 10 lbs painful, had mild pain with 5 lb as well Wall for standing support : chest press x 10 Pilates ring -  noted diff with Rt UE and shift pelvis to L   Lateral shift to Rt , painful Supine hamstring stretch 30 sec x 3   Manual Therapy  Manual pressure to L L5-SI and glute medius/max in sidelying Mentioned Dry needling as an option for her if symptoms warrant.     [facility] Adult PT Treatment:                                                DATE: 05/13/23 Therapeutic Exercise: Nustep L3 UE/LE x 5 minutes Seated lumbar flexion forward and lateral with ball  PPT x 10 Clam with GTB  Bridge with GTB 10 x 2  Supine  PPT with GTB marching  SKTC x 4  LTR x 10 Supine Hip flexor stretch Right - upon sitting up pt experienced pain into right buttock  SKTC  Supine figure 4 push and pull bilat   [facility] Adult PT Treatment:                                                DATE: 05/06/23 Therapeutic Exercise: Standing R lateral hip shift x10 Seated trunk flexion forward and lateral  SKTC x2 LTR x5  Supine figure 4 3x20 each Supine PPT 5 sec x 10  Clam with GTB 2x10 Bridging x15 5" Supine marching with GTB 2x10  [facility] Adult PT Treatment:                                                DATE: 05/03/23 Therapeutic Exercise: Seated lumbar flexion x 5 , added laterals x 3 each  SKTC  LTR  Supine PPT 5 sec x 10  Clam with GTB  Supine marching with GTB  Supine hamstring stretch with strap x 3 each - increased back pain so disc. Supine with ball for ab press 5 sec x 12  Supine bridge with legs over exercise ball  LTR with legs on ball Attempted h/s curl using exercise ball- increased pain   Supine figure 4   [facility] Adult PT Treatment:                                                DATE: 04/15/23 Therapeutic Exercise: Developed, instructed in, and pt completed therex as noted in HEP  Self care Use of pillows under knees when sleeping supine to assess comfort level  PATIENT EDUCATION:  Education details: Eval findings, POC, HEP, self care  Person educated: Patient Education method: Explanation, Demonstration, Tactile cues, Verbal cues, and Handouts Education comprehension: verbalized understanding, returned demonstration, verbal cues required, and tactile cues required  HOME EXERCISE PROGRAM: Access Code: [email or web address] URL: [email or web address] Date: 05/06/2023 Prepared by: Joellyn Rued  Exercises - Right Standing Lateral Shift Correction at Wall - Hold  - 2-3 x daily - 7 x weekly - 1  sets - 10 reps - Seated Flexion Stretch with Swiss Ball  - 3 x daily - 7 x weekly - 1 sets - 10-20 reps - 5-20 hold - Hooklying Single Knee to Chest  - 3 x daily - 7 x weekly - 3 sets - 3-5 reps - 10-20 hold - Supine Lower Trunk Rotation  - 1 x daily - 7 x weekly - 1 sets - 10 reps - Supine Figure 4 Piriformis Stretch  - 1 x daily - 7 x weekly - 3 sets - 10 reps - Supine Transversus Abdominis Bracing - Hands on Stomach  - 2 x daily - 7 x weekly - 3 sets - 10 reps - 3 hold - Supine March  - 1 x daily - 7 x weekly - 2 sets - 10 reps - Supine Bridge  - 1 x daily - 7 x weekly - 2 sets - 10 reps - 5 hold - Hooklying Clamshell with Resistance  - 1 x daily - 7 x weekly - 2 sets - 10 reps - 3 hold  ASSESSMENT:  CLINICAL IMPRESSION: Patient able to work on core strength and trunk/hip flexibility with good outcome. She has not been doing the lateral shift exercise much due topain .  Standing propped on the wall did increase her pain in L lumbar area. Significant lean here to Rt. With pelvis shifted L.  She did favorably respond to pressure point/manual compression to L L5 region.  Given info about TrP DN and she seemed interested.    EVAL: Patient is a 73 y.o. female who was seen today for physical therapy evaluation and treatment for M54.9 (ICD-10-CM) - Back pain. Pt presents with abnormal postural presentation with a R lateral shift of shoulder on pelvis. Pt reports she has not been told in the past she has an issue with scoliosis nor has past imaging indicated it. 03/18/23 Lumbar xray does reveal  progressive lower lumbar spondylosis and facet hypertrophy greatest at L4-5 and L5-S1, and Grade 1 degenerative anterolisthesis of L3 on L4. Pt's trunk motions are limited most significantly with ext and L SB, and her pain is increased with extended standing and walking. Pt will benefit from skilled PT 2w6 to address impairments to optimize back function with less pain.   OBJECTIVE IMPAIRMENTS: decreased  activity tolerance, difficulty walking, decreased ROM, decreased strength, postural dysfunction, obesity, and pain.   ACTIVITY LIMITATIONS: carrying, lifting, bending, standing, sleeping, and locomotion level  PARTICIPATION LIMITATIONS: meal prep, cleaning, laundry, shopping, community activity, and occupation  PERSONAL FACTORS: Fitness, Past/current experiences, Time since onset of injury/illness/exacerbation, and 1 comorbidity: High BMI  are also affecting patient's functional outcome.   REHAB POTENTIAL: Good  CLINICAL DECISION MAKING: Evolving/moderate complexity  EVALUATION COMPLEXITY: Moderate   GOALS:  SHORT TERM GOALS: Target date: 05/06/22  Pt will be Ind in an initial HEP  Baseline:started Goal status: MET  2.  Pt will voice understanding of measures to assist in pain reduction  Baseline: started 05/13/23: pacing, breaks, HEP Goal status: MET   LONG TERM GOALS:  Target date: 06/03/22  Pt will be Ind in a final HEP to maintain achieved LOF  Baseline: started Goal status: INITIAL  2.  Pt will report a 50% or greater decrease in low back pain for improved function, sleep, and QOL Baseline: 0-10/10 05/13/23: pt reports improved morning pain with HEP and pacing.  Goal status: INITIAL  3.  Pt's trunk ext and L SB will improve to mod or less limitation for improved trunk function and as indication of improved pain Baseline: marked limitation Goal status: INITIAL  4.  Pt will report an increased standing and walking tolerance to 15 mins or greater for improved function Baseline: 5 mins 05/13/23: standing 10 min, walking 5 min still  Goal status: ONGOING  5.  Pt's FOTO score will improved to the predicted value of 55% as indication of improved function  Baseline: 41% Goal status: INITIAL  PLAN:  PT FREQUENCY: 2x/week  PT DURATION: 6 weeks  PLANNED INTERVENTIONS: 97164- PT Re-evaluation, 97110-Therapeutic exercises, 97530- Therapeutic activity, 97535- Self Care,  97140- Manual therapy, [id number]- Aquatic Therapy, [id number]- Electrical stimulation (manual), 16109- Ultrasound, Patient/Family education, Taping, Dry Needling, Joint mobilization, Spinal mobilization, Cryotherapy, and Moist heat.  PLAN FOR NEXT SESSION: consider TPDN L lumbar.  Work towards Standing as tolerated. Review FOTO; assess response to HEP; progress therex as indicated; use of modalities, manual therapy; and TPDN as indicated.   Karie Mainland, PT 05/17/23 3:50 PM Phone: [phone] Fax: [phone]

## 2023-05-19 ENCOUNTER — Ambulatory Visit: Payer: Medicare HMO

## 2023-05-19 ENCOUNTER — Other Ambulatory Visit: Payer: Self-pay | Admitting: Internal Medicine

## 2023-05-19 ENCOUNTER — Telehealth: Payer: Self-pay

## 2023-05-19 NOTE — Telephone Encounter (Signed)
Spoke to pt about her 2nd no show appt. Advised pt of her next PT appt and of the attendance policy. Pt voiced understanding.

## 2023-05-20 ENCOUNTER — Telehealth: Payer: Self-pay | Admitting: Pharmacy Technician

## 2023-05-20 ENCOUNTER — Encounter: Payer: Self-pay | Admitting: Internal Medicine

## 2023-05-20 ENCOUNTER — Inpatient Hospital Stay: Payer: Medicare HMO | Attending: Internal Medicine | Admitting: Internal Medicine

## 2023-05-20 ENCOUNTER — Telehealth: Payer: Self-pay | Admitting: Pharmacist

## 2023-05-20 ENCOUNTER — Inpatient Hospital Stay: Payer: Medicare HMO

## 2023-05-20 ENCOUNTER — Other Ambulatory Visit: Payer: Self-pay | Admitting: Medical Oncology

## 2023-05-20 ENCOUNTER — Other Ambulatory Visit (HOSPITAL_COMMUNITY): Payer: Self-pay

## 2023-05-20 VITALS — BP 158/66 | HR 85 | Temp 97.5°F | Resp 16 | Ht 65.0 in | Wt 202.3 lb

## 2023-05-20 DIAGNOSIS — M898X8 Other specified disorders of bone, other site: Secondary | ICD-10-CM | POA: Diagnosis not present

## 2023-05-20 DIAGNOSIS — C9030 Solitary plasmacytoma not having achieved remission: Secondary | ICD-10-CM | POA: Diagnosis not present

## 2023-05-20 DIAGNOSIS — C9000 Multiple myeloma not having achieved remission: Secondary | ICD-10-CM | POA: Diagnosis not present

## 2023-05-20 DIAGNOSIS — Z923 Personal history of irradiation: Secondary | ICD-10-CM | POA: Diagnosis not present

## 2023-05-20 DIAGNOSIS — R768 Other specified abnormal immunological findings in serum: Secondary | ICD-10-CM | POA: Diagnosis not present

## 2023-05-20 LAB — CMP (CANCER CENTER ONLY)
ALT: 13 U/L (ref 0–44)
AST: 19 U/L (ref 15–41)
Albumin: 4 g/dL (ref 3.5–5.0)
Alkaline Phosphatase: 53 U/L (ref 38–126)
Anion gap: 6 (ref 5–15)
BUN: 21 mg/dL (ref 8–23)
CO2: 30 mmol/L (ref 22–32)
Calcium: 9.5 mg/dL (ref 8.9–10.3)
Chloride: 104 mmol/L (ref 98–111)
Creatinine: 1.02 mg/dL — ABNORMAL HIGH (ref 0.44–1.00)
GFR, Estimated: 58 mL/min — ABNORMAL LOW (ref >60)
Glucose, Bld: 94 mg/dL (ref 70–99)
Potassium: 4.3 mmol/L (ref 3.5–5.1)
Sodium: 140 mmol/L (ref 135–145)
Total Bilirubin: 0.7 mg/dL (ref 0.0–1.2)
Total Protein: 8.6 g/dL — ABNORMAL HIGH (ref 6.5–8.1)

## 2023-05-20 LAB — CBC WITH DIFFERENTIAL (CANCER CENTER ONLY)
Abs Immature Granulocytes: 0.05 10*3/uL (ref 0.00–0.07)
Basophils Absolute: 0.1 10*3/uL (ref 0.0–0.1)
Basophils Relative: 2 %
Eosinophils Absolute: 0.2 10*3/uL (ref 0.0–0.5)
Eosinophils Relative: 3 %
HCT: 35.6 % — ABNORMAL LOW (ref 36.0–46.0)
Hemoglobin: 11.9 g/dL — ABNORMAL LOW (ref 12.0–15.0)
Immature Granulocytes: 1 %
Lymphocytes Relative: 34 %
Lymphs Abs: 1.7 10*3/uL (ref 0.7–4.0)
MCH: 30.7 pg (ref 26.0–34.0)
MCHC: 33.4 g/dL (ref 30.0–36.0)
MCV: 91.8 fL (ref 80.0–100.0)
Monocytes Absolute: 0.6 10*3/uL (ref 0.1–1.0)
Monocytes Relative: 13 %
Neutro Abs: 2.3 10*3/uL (ref 1.7–7.7)
Neutrophils Relative %: 47 %
Platelet Count: 256 10*3/uL (ref 150–400)
RBC: 3.88 MIL/uL (ref 3.87–5.11)
RDW: 13.1 % (ref 11.5–15.5)
WBC Count: 5 10*3/uL (ref 4.0–10.5)
nRBC: 0 % (ref 0.0–0.2)

## 2023-05-20 LAB — LACTATE DEHYDROGENASE: LDH: 176 U/L (ref 98–192)

## 2023-05-20 MED ORDER — WARFARIN SODIUM 5 MG PO TABS: 5.0000 mg | ORAL_TABLET | Freq: Every day | ORAL | 11 refills | Status: DC

## 2023-05-20 MED ORDER — PROCHLORPERAZINE MALEATE 10 MG PO TABS: 10.0000 mg | ORAL_TABLET | Freq: Four times a day (QID) | ORAL | 1 refills | Status: DC | PRN

## 2023-05-20 MED ORDER — ACYCLOVIR 400 MG PO TABS: 400.0000 mg | ORAL_TABLET | Freq: Two times a day (BID) | ORAL | 5 refills | Status: DC

## 2023-05-20 MED ORDER — ONDANSETRON HCL 8 MG PO TABS: 8.0000 mg | ORAL_TABLET | Freq: Three times a day (TID) | ORAL | 1 refills | Status: DC | PRN

## 2023-05-20 MED ORDER — DEXAMETHASONE 4 MG PO TABS: ORAL_TABLET | ORAL | 5 refills | Status: DC

## 2023-05-20 NOTE — Progress Notes (Signed)
START ON PATHWAY REGIMEN - Multiple Myeloma and Other Plasma Cell Dyscrasias   DaraVRd (Daratumumab SUBQ + Bortezomib SUBQ D1,4,8,11 + Lenalidomide PO D1-14 + Dexamethasone 20 mg IV/PO D1,2,8,9,15,16) q21 Days (Induction Schema):   A cycle is every 21 days:     Lenalidomide      Dexamethasone      Bortezomib      Daratumumab and hyaluronidase-fihj    DaraVRd (Daratumumab SUBQ + Bortezomib SUBQ D1,4,8,11 + Lenalidomide PO D1-14 + Dexamethasone 20 mg IV/PO D1,2,8,9,15,16) q21 Days (Consolidation Schema):   A cycle is every 21 days:     Lenalidomide      Dexamethasone      Bortezomib      Daratumumab and hyaluronidase-fihj   **Always confirm dose/schedule in your pharmacy ordering system**  Patient Characteristics: Multiple Myeloma, Newly Diagnosed, Transplant Eligible, Standard Risk Disease Classification: Multiple Myeloma Therapeutic Status: Newly Diagnosed R2-ISS Staging: I Is Patient Eligible for Transplant<= Transplant Eligible Risk Status: Standard Risk Intent of Therapy: Non-Curative / Palliative Intent, Discussed with Patient

## 2023-05-20 NOTE — Addendum Note (Signed)
Addended by: Terald Sleeper B on: 05/20/2023 10:00 AM   Modules accepted: Orders

## 2023-05-20 NOTE — Progress Notes (Signed)
[facility] [phone]   [phone]  OFFICE PROGRESS NOTE  [name], MD [address] 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  PRIOR THERAPY:  1) Palliative radiotherapy to the plasmacytoma of the proximal right humerus under the care of Dr. Roselind Messier. 2) Status post tumor resection from the proximal humerus with reconstruction under the care of Dr. Jana Half at [facility] on May 01, 2021.  CURRENT THERAPY: Systemic treatment with subcutaneous daratumumab, subcutaneous Velcade on weekly basis, Revlimid 25 mg p.o. for 14 days every 3 weeks in addition to Decadron 20 mg p.o. weekly during the course of chemotherapy.  First dose May 27, 2023.  INTERVAL HISTORY: Joanna Ray 73 y.o. female returns to the clinic today for follow-up visit accompanied by her sister.Discussed the use of AI scribe software for clinical note transcription with the patient, who gave verbal consent to proceed.  History of Present Illness   The patient, aged 73, was diagnosed with plasma cytoma of the proximal right humerus in February 2022. She underwent radiation therapy and subsequently had surgery in June 2023. Since then, the patient has been under observation. Recently, there has been a noted increase in her IgG and light chain protein levels, raising concerns of progression to multiple myeloma. A skeletal bone survey and PET scan were performed, revealing multiple areas of concern beyond the initial humerus site. The patient has not reported any new symptoms or discomfort related to these findings. PET scan: multiple hypermetabolic myelomatous lesions involving the right humerus shaft, bilateral femoral shafts, left sacrum, right pedicle and transverse process of T7, and a small  hypermetabolic focus in the manubrium        MEDICAL HISTORY: Past Medical History:  Diagnosis Date   Anxiety    Arthritis    Cancer (HCC) 2021   multiple myloma Right Arm   Cataract    GERD (gastroesophageal reflux disease)    Hyperlipidemia    Hypertension    Urticaria     ALLERGIES:  is allergic to shellfish allergy.  MEDICATIONS:  Current Outpatient Medications  Medication Sig Dispense Refill   ALPRAZolam (XANAX) 0.5 MG tablet Take 1 tablet by mouth twice daily as needed for anxiety 60 tablet 5   aspirin 81 MG tablet Take 81 mg by mouth daily.     cholecalciferol (VITAMIN D) 1000 UNITS tablet Take 1,000 Units by mouth daily.     cyclobenzaprine (FLEXERIL) 10 MG tablet Take 1 tablet (10 mg total) by mouth 3 (three) times daily as needed for muscle spasms. 90 tablet 0   EPINEPHrine (EPIPEN 2-PAK) 0.3 mg/0.3 mL IJ SOAJ injection Inject 0.3 mg into the muscle as needed for anaphylaxis. 1 each PRN   fexofenadine (ALLEGRA) 180 MG tablet Take 1 tablet (180 mg total) by mouth daily. 90 tablet 1   HYDROcodone bit-homatropine (HYCODAN) 5-1.5 MG/5ML syrup Take 5 mLs by mouth every 8 (eight) hours as needed for cough. 120 mL 0   hydrocortisone valerate cream (WESTCORT) 0.2 % 1 app     meclizine (ANTIVERT) 25 MG tablet Take 1 tablet (25 mg total) by mouth 3 (three) times daily as needed for dizziness. 30 tablet 0   methylPREDNISolone (MEDROL) 4 MG tablet Take in tapering course as directed. Start with 6 tabs day 1 and decrease by one tab daily 6-5-4-3-2-1  taper for lumbar radiculopathy 21 tablet 0   rosuvastatin (CRESTOR) 20 MG tablet TAKE 1/2 TABLET ONE TIME DAILY 45 tablet 3   valsartan-hydrochlorothiazide (DIOVAN-HCT) 160-25 MG tablet TAKE 1 TABLET EVERY DAY 90 tablet 3   vitamin E 100 UNIT capsule Take 100 Units by mouth daily.     No current facility-administered medications for this visit.    SURGICAL HISTORY:  Past Surgical History:  Procedure Laterality Date   CATARACT  EXTRACTION, BILATERAL Bilateral    DILATION AND CURETTAGE OF UTERUS      REVIEW OF SYSTEMS:  Constitutional: positive for fatigue Eyes: negative Ears, nose, mouth, throat, and face: negative Respiratory: negative Cardiovascular: negative Gastrointestinal: negative Genitourinary:negative Integument/breast: negative Hematologic/lymphatic: negative Musculoskeletal:positive for arthralgias and bone pain Neurological: negative Behavioral/Psych: negative Endocrine: negative Allergic/Immunologic: negative   PHYSICAL EXAMINATION: General appearance: alert, cooperative, fatigued, and no distress Head: Normocephalic, without obvious abnormality, atraumatic Neck: no adenopathy, no JVD, supple, symmetrical, trachea midline, and thyroid not enlarged, symmetric, no tenderness/mass/nodules Lymph nodes: Cervical, supraclavicular, and axillary nodes normal. Resp: clear to auscultation bilaterally Back: symmetric, no curvature. ROM normal. No CVA tenderness. Cardio: regular rate and rhythm, S1, S2 normal, no murmur, click, rub or gallop GI: soft, non-tender; bowel sounds normal; no masses,  no organomegaly Extremities: extremities normal, atraumatic, no cyanosis or edema and No edema Neurologic: Alert and oriented X 3, normal strength and tone. Normal symmetric reflexes. Normal coordination and gait  ECOG PERFORMANCE STATUS: 1 - Symptomatic but completely ambulatory  Blood pressure (!) 158/66, pulse 85, temperature (!) 97.5 F (36.4 C), temperature source Temporal, resp. rate 16, height 5\' 5"  (1.651 m), weight 202 lb 4.8 oz (91.8 kg), SpO2 100%.  LABORATORY DATA: Lab Results  Component Value Date   WBC 6.5 04/27/2023   HGB 12.2 04/27/2023   HCT 36.9 04/27/2023   MCV 93.4 04/27/2023   PLT 247 04/27/2023      Chemistry      Component Value Date/Time   NA 138 04/27/2023 1021   NA 145 (H) 12/28/2018 1436   K 3.6 04/27/2023 1021   CL 103 04/27/2023 1021   CO2 29 04/27/2023 1021   BUN 20  04/27/2023 1021   BUN 15 12/28/2018 1436   CREATININE 1.08 (H) 04/27/2023 1021   CREATININE 1.10 (H) 01/14/2023 0908      Component Value Date/Time   CALCIUM 9.6 04/27/2023 1021   ALKPHOS 64 04/27/2023 1021   AST 19 04/27/2023 1021   ALT 14 04/27/2023 1021   BILITOT 0.9 04/27/2023 1021       RADIOGRAPHIC STUDIES: NM PET Image Initial (PI) Whole Body Result Date: 05/17/2023 CLINICAL DATA:  Subsequent treatment strategy for multiple myeloma. EXAM: NUCLEAR MEDICINE PET WHOLE BODY TECHNIQUE: 10.11 mCi F-18 FDG was injected intravenously. Full-ring PET imaging was performed from the head to foot after the radiotracer. CT data was obtained and used for attenuation correction and anatomic localization. Fasting blood glucose: 91 mg/dl COMPARISON:  Metastatic survey 03/22/2023 FINDINGS: Mediastinal blood pool activity: SUV max 2.96 HEAD/NECK: No hypermetabolic activity in the scalp. No hypermetabolic cervical lymph nodes. Incidental CT findings: none CHEST: No hypermetabolic mediastinal or hilar nodes. No hypermetabolic breast lesions. No axillary supraclavicular adenopathy. No suspicious pulmonary nodules on the CT scan. Incidental CT findings: Scattered aortic calcifications. ABDOMEN/PELVIS: No abnormal hypermetabolic activity within the liver, pancreas, adrenal glands, or spleen. No hypermetabolic lymph nodes in the abdomen or pelvis. Incidental CT findings: none SKELETON: Mild hypermetabolism around the right shoulder arthroplasty, not unexpected. There is  a focus of hypermetabolism in the right lower humeral shaft consistent with a myelomatous lesion. SUV max is 4.82. Suspected small lytic lesions in the left humeral shaft but no associated hypermetabolism. A lytic myelomatous lesion in the proximal left femoral shaft just below the lesser trochanter has an SUV max 5.66. Three hypermetabolic myelomatous lesions in the right femoral shaft correlating with lytic lesions on the CT scan. The more proximal  lesion has an SUV max of 6.51. The most distal lesion has an SUV max of 3.30 and the middle lesions has an SUV max of 5.27. Hypermetabolic left sacral lesion has an SUV max of 5.62. Lytic destructive myelomatous lesion involving the right pedicle and transverse process of T7 has an SUV max of 4.30. Small focus hypermetabolism in the left semimembranosus muscle without obvious lesion on the CT scan. Possible focus of hypermetabolic. Incidental CT findings: Degenerative changes in the lumbar spine. EXTREMITIES: Hypermetabolic foci in the extremities as detailed above. Incidental CT findings: none IMPRESSION: 1. Multiple hypermetabolic myelomatous lesions involving the right humeral shaft, bilateral femoral shafts, left sacrum and right pedicle and transverse process of T7. 2. Small hypermetabolic focus in the left semimembranosus muscle is indeterminate. 3. Aortic atherosclerosis. Aortic Atherosclerosis (ICD10-I70.0). Electronically Signed   By: Rudie Meyer M.D.   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  The patient had extensive work-up for multiple myeloma including a bone marrow biopsy and aspirate that showed only 6% plasma cells.  Her protein studies still suspicious for underlying MGUS/multiple myeloma but the findings are not enough to call it multiple myeloma at this point. The patient underwent radiotherapy to the plasmacytoma of the proximal right humerus and tolerated the procedure fairly well.  She is currently on observation.  The skeletal bone survey showed no other lytic bone lesion except the proximal right humerus. The patient was treated with palliative radiotherapy in the past but recent skeletal bone survey showed progression of the lytic lesion in the proximal humeral diaphysis with pathologic fracture.  She underwent surgical resection of the tumor  with reconstruction under the care of Dr. Jana Half at [facility] on May 01, 2021.   The patient was found on recent protein study to have elevated IgG level.  She underwent a bone marrow biopsy and aspirate that showed 10-20% plasma cells. She had skeletal bone survey performed recently that showed new lytic lesions in the proximal left femoral shaft and midshaft of the right femur consistent with myelomatous involvement or other lytic metastasis.  There was also scattered areas of endosteal scalloping in the left humeral shaft concerning for myelomatous involvement. Assessment and Plan    Multiple Myeloma IgG subtype diagnosed in November 2024 Diagnosed with plasmacytoma of the proximal right humerus in February 2022. Recent protein studies indicated increased IgG and light chains, suggesting progression to multiple myeloma. Skeletal bone survey and PET scan confirmed multiple hypermetabolic myelomatous lesions in the right humerus shaft, bilateral femoral shafts, left sacrum, right pedicle, and transverse process of T7. Treatment involves daratumumab, Velcade, Revlimid, and Decadron. Risks include fatigue, nausea, neuropathy, and potential blood clots. Benefits include slowing disease progression and preventing complications such as kidney damage, anemia, and hypercalcemia. Alternatives include no treatment, which could lead to worsening condition and severe complications. Outcome statistics indicate that while multiple myeloma is not curable, many patients live for a long time with  current treatment options. She agreed to proceed with the treatment. - Administer daratumumab and Velcade subcutaneously weekly, - Prescribe Revlimid 25 mg po daily (14 days every three weeks) - Administer Decadron 20 mg once a week - Monitor blood counts regularly - Prescribe Aciclovir for viral infection prophylaxis - Prescribe Coumadin for clot prevention - Schedule follow-up after four rounds of treatment  to assess response - Discuss potential autologous stem cell transplant if treatment response is favorable - Provide education class about the treatment regimen - Obtain consent for Revlimid due to teratogenic risks  Follow-up - Schedule follow-up appointment after four rounds of treatment - Ensure prescriptions are sent to the pharmacy - Coordinate with the nurse to obtain consent for Revlimid - Plan for potential autologous stem cell transplant consultation if treatment is effective.   Will also consider The patient for treatment with Zometa infusion after dental clearance.  The patient voices understanding of current disease status and treatment options and is in agreement with the current care plan.  All questions were answered. The patient knows to call the clinic with any problems, questions or concerns. We can certainly see the patient much sooner if necessary.  The total time spent in the appointment was 40 minutes.  Disclaimer: This note was dictated with voice recognition software. Similar sounding words can inadvertently be transcribed and may not be corrected upon review.

## 2023-05-20 NOTE — Telephone Encounter (Signed)
Oral Oncology Patient Advocate Encounter   Received notification that prior authorization for lenalidomide is required.   PA submitted on 05/20/23 Key [id number] Status is pending     Jinger Neighbors, CPhT-Adv Oncology Pharmacy Patient Advocate [facility] Direct Number: [phone]  Fax: [phone]

## 2023-05-20 NOTE — Telephone Encounter (Signed)
Oral Oncology Pharmacist Encounter  Received new prescription for Revlimid (lenalidomide) for the treatment of IgG multiple myeloma in conjunction with daratumumab, bortezomib and dexamethasone, planned duration 4-6 cycles.  CBC w/ Diff and CMP from 04/26/24 assessed, no relevant lab abnormalities requiring baseline dose adjustment required at this time. Plan is for Revlimid 25 mg daily for 14 days on, 7 days off, repeat every 21 days. Prescription dose and frequency assessed for appropriateness.  Current medication list in Epic reviewed, no relevant/significant DDIs with Revlimid identified.  Evaluated chart and no patient barriers to medication adherence noted.   Patient agreement for treatment documented in MD note on 05/20/23.  Prescription has need to be e-scribed to [facility] due to patients insurance restrictions and medication being limited distribution.   Oral Oncology Clinic will continue to follow for insurance authorization, copayment issues, initial counseling and start date.  Sherry Ruffing, PharmD, BCPS, BCOP Hematology/Oncology Clinical Pharmacist [facility] Oral Chemotherapy Navigation Clinics [phone] 05/20/2023 10:11 AM

## 2023-05-20 NOTE — Progress Notes (Signed)
Revlimid registration pages signed by pt. and faxed to [employer].

## 2023-05-20 NOTE — Patient Instructions (Signed)
Lenalidomide Capsules What is this medication? LENALIDOMIDE (len a LID oh mide) treats blood and bone marrow cancers. It works by slowing down the growth of cancer cells. This medicine may be used for other purposes; ask your health care provider or pharmacist if you have questions. COMMON BRAND NAME(S): Revlimid What should I tell my care team before I take this medication? They need to know if you have any of these conditions: Blood clots High blood pressure High cholesterol Kidney disease Lactose intolerant Liver disease Thyroid disease Tobacco use An unusual or allergic reaction to lenalidomide, thalidomide, other medications, foods, dyes, or preservatives Pregnant or trying to get pregnant Breast-feeding How should I use this medication? Take this medication by mouth with water. Take it as directed on the prescription label at the same time every day. Do not cut, crush, or chew this medication. Swallow the capsules whole. You can take it with or without food. If it upsets your stomach, take it with food. Keep taking it unless your care team tells you to stop. A special MedGuide will be given to you by the pharmacist with each prescription and refill. Be sure to read this information carefully each time. Talk to your care team about the use of this medication in children. Special care may be needed. Overdosage: If you think you have taken too much of this medicine contact a poison control center or emergency room at once. NOTE: This medicine is only for you. Do not share this medicine with others. What if I miss a dose? If you miss a dose, take it as soon as you can unless it is more than 12 hours late. If it is more than 12 hours late, skip the missed dose. Take the next dose at the normal time. Do not take double or extra doses. What may interact with this medication? This medication may interact with the following: Digoxin Estrogen hormones Medications that help the body make  more red blood cells, such as epoetin alfa or darbepoetin alfa Warfarin This list may not describe all possible interactions. Give your health care provider a list of all the medicines, herbs, non-prescription drugs, or dietary supplements you use. Also tell them if you smoke, drink alcohol, or use illegal drugs. Some items may interact with your medicine. What should I watch for while using this medication? Visit your care team for regular checks on your progress. You may need blood work done while taking this medication. This medication may cause serious skin reactions. They can happen weeks to months after starting the medication. Contact your care team right away if you notice fevers or flu-like symptoms with a rash. The rash may be red or purple and then turn into blisters or peeling of the skin. You may also notice a red rash with swelling of the face, lips, or lymph nodes in your neck or under your arms. Talk to your care team about your risk of cancer. You may be more at risk for certain types of cancers if you take this medication. Talk to your care team if you or your partner wish to become pregnant or think either of you might be pregnant. This medication can cause serious birth defects if taken during pregnancy or for 4 weeks after stopping treatment. Two negative pregnancy tests are required before starting this medication. A negative pregnancy test is also required every 2 to 4 weeks during treatment, even if you are not sexually active. Two reliable forms of contraception are recommended while you  are taking this medication and for 4 weeks after stopping treatment. Talk to your care team about effective forms of contraception. If you become pregnant, miss a menstrual cycle, or stop using contraception, stop taking this medication. Call your care team. Severe birth defects may occur even if just 1 dose is taken. Do not breastfeed while taking this medication. Talk to your care team about  breastfeeding. Changes to your treatment plan may be needed. If your partner can get pregnant, use a condom during sex while taking this medication and for 4 weeks after the last dose. Tell your care team right away if you think your partner might be pregnant. This medication can cause serious birth defects. Do not donate sperm while taking this medication and for 4 weeks after the last dose. Do not donate blood while you are talking this medication or for 4 weeks after stopping it. Donated blood may contain enough of this medication to cause birth defects in a fetus if transfused to someone who is pregnant. What side effects may I notice from receiving this medication? Side effects that you should report to your care team as soon as possible: Allergic reactions--skin rash, itching, hives, swelling of the face, lips, tongue, or throat Blood clot--pain, swelling, or warmth in the leg, shortness of breath, chest pain Change in your skin, such as a new growth, a sore that doesn't heal, or a change in a mole Heart attack--pain or tightness in the chest, shoulders, arms, or jaw, nausea, shortness of breath, cold or clammy skin, feeling faint or lightheaded High thyroid levels (hyperthyroidism)--fast or irregular heartbeat, weight loss, excessive sweating or sensitivity to heat, tremors or shaking, anxiety, nervousness, irregular menstrual cycle or spotting Infection--fever, chills, cough, or sore throat Liver injury--right upper belly pain, loss of appetite, nausea, light-colored stool, dark yellow or brown urine, yellowing skin or eyes, unusual weakness or fatigue Low thyroid levels (hypothyroidism)--unusual weakness or fatigue, increased sensitivity to cold, constipation, hair loss, dry skin, weight gain, feelings of depression Rash, fever, and swollen lymph nodes Redness, blistering, peeling, or loosening of the skin, including inside the mouth Stroke--sudden numbness or weakness of the face, arm, or  leg, trouble speaking, confusion, trouble walking, loss of balance or coordination, dizziness, severe headache, change in vision Tumor lysis syndrome (TLS)--nausea, vomiting, diarrhea, decrease in the amount of urine, dark urine, unusual weakness or fatigue, confusion, muscle pain or cramps, fast or irregular heartbeat, joint pain Unusual bruising or bleeding Side effects that usually do not require medical attention (report to your care team if they continue or are bothersome): Back pain Cough Diarrhea Fatigue Headache Nausea This list may not describe all possible side effects. Call your doctor for medical advice about side effects. You may report side effects to FDA at 1-800-FDA-1088. Where should I keep my medication? Keep out of the reach of children and pets. Store at room temperature between 20 and 25 degrees C (68 and 77 degrees F). Get rid of any unused medication after the expiration date. It is important to get rid of the medication as soon as you no longer need it or it is expired. You can do this in two ways: Take the medication to a medication take-back program. Check with your pharmacy or law enforcement to find a location. If you cannot return the medication, follow the directions in the MedGuide. NOTE: This sheet is a summary. It may not cover all possible information. If you have questions about this medicine, talk to your doctor, pharmacist,  or health care provider.  2024 Elsevier/Gold Standard (2022-02-19 00:00:00)

## 2023-05-20 NOTE — Telephone Encounter (Signed)
Oral Oncology Patient Advocate Encounter  Prior Authorization for lenalidomide has been approved.    PA# [id number] Effective dates: 04/28/23 through 04/26/24  Patients co-pay is $1,945.86.    Joanna Ray, CPhT-Adv Oncology Pharmacy Patient Advocate [facility] Direct Number: [phone]  Fax: [phone]

## 2023-05-20 NOTE — Telephone Encounter (Signed)
Oral Oncology Patient Advocate Encounter  Was successful in securing patient a $12,000 grant from [facility] to provide copayment coverage for lenalidomide.  This will keep the out of pocket expense at $0.     [employer] ID: [id number]  I have spoken with the patient.   The billing information is as follows and has been shared with [facility].    RxBin: [id number] PCN: [id number] Member ID: [id number] Group ID: [id number] Dates of Eligibility: 04/20/23 through 04/18/24  Fund:  MM  Joanna Ray, CPhT-Adv Oncology Pharmacy Patient Advocate [facility] Direct Number: [phone]  Fax: [phone]

## 2023-05-20 NOTE — Telephone Encounter (Signed)
Jisselle coming back to sign Revlimid form and faxed back to [employer] with her signature.

## 2023-05-21 ENCOUNTER — Other Ambulatory Visit: Payer: Self-pay

## 2023-05-23 NOTE — Therapy (Signed)
OUTPATIENT PHYSICAL THERAPY THORACOLUMBAR TREATMENT    Patient Name: Joanna Ray MRN: [id number] DOB:1951-04-17, 73 y.o., female Today's Date: 05/25/2023  END OF SESSION:  PT End of Session - 05/25/23 1554     Visit Number 6    Number of Visits 13    Date for PT Re-Evaluation 06/04/23    Authorization Type [employer] MEDICARE HMO    PT Start Time 1547    PT Stop Time 1629    PT Time Calculation (min) 42 min    Activity Tolerance Patient tolerated treatment well    Behavior During Therapy WFL for tasks assessed/performed                Past Medical History:  Diagnosis Date   Anxiety    Arthritis    Cancer (HCC) 2021   multiple myloma Right Arm   Cataract    GERD (gastroesophageal reflux disease)    Hyperlipidemia    Hypertension    Urticaria    Past Surgical History:  Procedure Laterality Date   CATARACT EXTRACTION, BILATERAL Bilateral    DILATION AND CURETTAGE OF UTERUS     Patient Active Problem List   Diagnosis Date Noted   Multiple myeloma (HCC) 05/20/2023   Status post total shoulder arthroplasty, right 01/17/2023   History of plasmacytoma 01/17/2023   Angioedema 01/21/2019   Vertigo 09/28/2016   Hyperlipidemia 09/25/2011   Hypertension     PCP: [name], MD  REFERRING PROVIDER: [name], MD  REFERRING DIAG: M54.9 (ICD-10-CM) - Back pain   Rationale for Evaluation and Treatment: Rehabilitation  THERAPY DIAG:  Chronic bilateral low back pain with left-sided sciatica  Abnormal posture  Muscle weakness (generalized)  ONSET DATE: 2 months  SUBJECTIVE:                                                                                                                                                                                           SUBJECTIVE STATEMENT: Pt has found her HEP to be helpful especially in the AM or after prolonged sitting with reducing pain and improving mobility.    EVAL: Insidious onset of L low back pain  approx 2 months ago which now radiates down the back of her L leg to the knee. Occasionally has tingling along the lateral aspect of her L calf.   PERTINENT HISTORY:  High BMI, arthritis  PAIN:  Are you having pain? Yes: NPRS scale: 5/10.  Pain location: L low back  Pain description: ache Aggravating factors: Worse when 1st wakes up, extended walking and standing greaterthan 5 mins Relieving factors: Hot soak in the tub, tylenol,  muscle relaxer, sitting  PRECAUTIONS: None  RED FLAGS: None   WEIGHT BEARING RESTRICTIONS: No  FALLS:  Has patient fallen in last 6 months? No  LIVING ENVIRONMENT: Lives with: lives with their family Lives in: House/apartment No issue accessing or with mobility in home  OCCUPATION: Part-time: Teach challenged adults, used to work at [employer]   PLOF: Independent  PATIENT GOALS: To decrease the pain. I want to be able to walk with less pain, no pain ! Grocery store, work.   NEXT MD VISIT: March 2025.   OBJECTIVE:  Note: Objective measures were completed at Evaluation unless otherwise noted.  DIAGNOSTIC FINDINGS:  LUMBAR 03/18/23: IMPRESSION: 1. Progressive lower lumbar spondylosis and facet hypertrophy greatest at L4-5 and L5-S1. 2. Grade 1 degenerative anterolisthesis of L3 on L4. 3. No acute bony abnormality.  PATIENT SURVEYS:  FOTO: Perceived function  41%, predicted  55%   COGNITION: Overall cognitive status: Within functional limits for tasks assessed     SENSATION: WFL  MUSCLE LENGTH: Hamstrings: Right NT deg; Left NT deg Maisie Fus test: Right NT deg; Left NT deg  POSTURE:  Lateral R shift of shoulder on pelvis  PALPATION: Not significantly tender to palpation  LUMBAR ROM:   AROM eval 05/25/23  Flexion Full, p upon return   Extension Markedly limited, p Min limited, decreasing pain with reps  Right lateral flexion Full, no p   Left lateral flexion Marked limited, p Markedly limited, P  Right rotation Full, min p   Left  rotation Min limited, p   P= concordant pain  (Blank rows = not tested)  LOWER EXTREMITY ROM:     Grossly WNLs Active  Right eval Left eval  Hip flexion    Hip extension    Hip abduction    Hip adduction    Hip internal rotation    Hip external rotation    Knee flexion    Knee extension    Ankle dorsiflexion    Ankle plantarflexion    Ankle inversion    Ankle eversion     (Blank rows = not tested)  LOWER EXTREMITY MMT:    Myotome screen negative equal bilat MMT Right eval Left eval  Hip flexion    Hip extension    Hip abduction    Hip adduction    Hip internal rotation    Hip external rotation    Knee flexion    Knee extension    Ankle dorsiflexion    Ankle plantarflexion    Ankle inversion    Ankle eversion     (Blank rows = not tested)  LUMBAR SPECIAL TESTS:  Straight leg raise test: Negative and Slump test: Negative  FUNCTIONAL TESTS:  NT  GAIT: Distance walked: 200' Assistive device utilized: None Level of assistance: Complete Independence Comments: Antalgic gait pattern over L LE  TREATMENT DATE:  [facility] Adult PT Treatment:                                                DATE: 05/19/23 Therapeutic Exercise: Nustep L5 UE/LE x 6 minutes Lateral shifting at wall L and R LTR  Pilates circle on thighs press 15x SLR with Pilates Circle x 10, with L LE aggravated L low back Palloff press 2x10 RTB Manual Therapy: Grade 3 UPAs to the bilat lumbar vertebrae Self Care: Use of tennis ball for on  wall for massage with pt completing and returning demonstration  [facility] Adult PT Treatment:                                                DATE: 05/17/23 Therapeutic Exercise: Nustep L5 UE/LE x 6 minutes Seated lumbar flexion forward and lateral with ball  PPT x 10 Added arm press into the Pilates circle on thighs  Partial bridge with Pilates circle  SLR with Circle x 10  Piriformis knee push and pull x 5 each side  Sit to stand 5 lbs x 10 , tried 10 lbs painful,  had mild pain with 5 lb as well Wall for standing support : chest press x 10 Pilates ring - noted diff with Rt UE and shift pelvis to L   Lateral shift to Rt , painful Supine hamstring stretch 30 sec x 3   Manual Therapy  Manual pressure to L L5-SI and glute medius/max in sidelying Mentioned Dry needling as an option for her if symptoms warrant.   [facility] Adult PT Treatment:                                                DATE: 05/13/23 Therapeutic Exercise: Nustep L3 UE/LE x 5 minutes Seated lumbar flexion forward and lateral with ball  PPT x 10 Clam with GTB  Bridge with GTB 10 x 2  Supine PPT with GTB marching  SKTC x 4  LTR x 10 Supine Hip flexor stretch Right - upon sitting up pt experienced pain into right buttock  SKTC  Supine figure 4 push and pull bilat   [facility] Adult PT Treatment:                                                DATE: 05/06/23 Therapeutic Exercise: Standing R lateral hip shift x10 Seated trunk flexion forward and lateral  SKTC x2 LTR x5  Supine figure 4 3x20 each Supine PPT 5 sec x 10  Clam with GTB 2x10 Bridging x15 5" Supine marching with GTB 2x10                                                                                                                     PATIENT EDUCATION:  Education details: Eval findings, POC, HEP, self care  Person educated: Patient Education method: Explanation, Demonstration, Tactile cues, Verbal cues, and Handouts Education comprehension: verbalized understanding, returned demonstration, verbal cues required, and tactile cues required  HOME EXERCISE PROGRAM: Access Code: [email or web address] URL: [email or web address] Date: 05/06/2023 Prepared by: Joellyn Rued  Exercises - Right Standing Lateral Shift Correction at  Wall - Hold  - 2-3 x daily - 7 x weekly - 1 sets - 10 reps - Seated Flexion Stretch with Swiss Ball  - 3 x daily - 7 x weekly - 1 sets - 10-20 reps - 5-20 hold - Hooklying Single Knee to Chest  - 3 x daily -  7 x weekly - 3 sets - 3-5 reps - 10-20 hold - Supine Lower Trunk Rotation  - 1 x daily - 7 x weekly - 1 sets - 10 reps - Supine Figure 4 Piriformis Stretch  - 1 x daily - 7 x weekly - 3 sets - 10 reps - Supine Transversus Abdominis Bracing - Hands on Stomach  - 2 x daily - 7 x weekly - 3 sets - 10 reps - 3 hold - Supine March  - 1 x daily - 7 x weekly - 2 sets - 10 reps - Supine Bridge  - 1 x daily - 7 x weekly - 2 sets - 10 reps - 5 hold - Hooklying Clamshell with Resistance  - 1 x daily - 7 x weekly - 2 sets - 10 reps - 3 hold  ASSESSMENT:  CLINICAL IMPRESSION: PT was completed for lumbar mobility per mobilizations, therex, and tennis ball massage with wall. Core strength was also completed with palloff press initiated. Pt's pain fluctuated during the session, but did not increase overall above 5/10. Pt reports improvement in pain since the start of PT. Trunk ext ROM has improved while L sidebending continues to be markedly limited. Pt will continue to benefit from skilled PT to address impairments for improved back function with minimized pain. Complete FOTO the next PT session.    EVAL: Patient is a 73 y.o. female who was seen today for physical therapy evaluation and treatment for M54.9 (ICD-10-CM) - Back pain. Pt presents with abnormal postural presentation with a R lateral shift of shoulder on pelvis. Pt reports she has not been told in the past she has an issue with scoliosis nor has past imaging indicated it. 03/18/23 Lumbar xray does reveal  progressive lower lumbar spondylosis and facet hypertrophy greatest at L4-5 and L5-S1, and Grade 1 degenerative anterolisthesis of L3 on L4. Pt's trunk motions are limited most significantly with ext and L SB, and her pain is increased with extended standing and walking. Pt will benefit from skilled PT 2w6 to address impairments to optimize back function with less pain.   OBJECTIVE IMPAIRMENTS: decreased activity tolerance, difficulty walking, decreased  ROM, decreased strength, postural dysfunction, obesity, and pain.   ACTIVITY LIMITATIONS: carrying, lifting, bending, standing, sleeping, and locomotion level  PARTICIPATION LIMITATIONS: meal prep, cleaning, laundry, shopping, community activity, and occupation  PERSONAL FACTORS: Fitness, Past/current experiences, Time since onset of injury/illness/exacerbation, and 1 comorbidity: High BMI  are also affecting patient's functional outcome.   REHAB POTENTIAL: Good  CLINICAL DECISION MAKING: Evolving/moderate complexity  EVALUATION COMPLEXITY: Moderate   GOALS:  SHORT TERM GOALS: Target date: 05/06/22  Pt will be Ind in an initial HEP  Baseline:started Goal status: MET  2.  Pt will voice understanding of measures to assist in pain reduction  Baseline: started 05/13/23: pacing, breaks, HEP Goal status: MET   LONG TERM GOALS: Target date: 06/03/22  Pt will be Ind in a final HEP to maintain achieved LOF  Baseline: started Goal status: INITIAL  2.  Pt will report a 50% or greater decrease in low back pain for improved function, sleep, and QOL Baseline: 0-10/10 05/13/23: pt reports improved morning  pain with HEP and pacing.  05/25/23: Pt estimates 40% improvement Goal status: Improving  3.  Pt's trunk ext and L SB will improve to mod or less limitation for improved trunk function and as indication of improved pain Baseline: marked limitation 05/25/23: see flow sheets Goal status: Improving  4.  Pt will report an increased standing and walking tolerance to 15 mins or greater for improved function Baseline: 5 mins 05/13/23: standing 10 min, walking 5 min still  Goal status: ONGOING  5.  Pt's FOTO score will improved to the predicted value of 55% as indication of improved function  Baseline: 41% Goal status: INITIAL  PLAN:  PT FREQUENCY: 2x/week  PT DURATION: 6 weeks  PLANNED INTERVENTIONS: 97164- PT Re-evaluation, 97110-Therapeutic exercises, 97530- Therapeutic activity,  97535- Self Care, 08657- Manual therapy, [id number]- Aquatic Therapy, [id number]- Electrical stimulation (manual), [id number]- Ultrasound, Patient/Family education, Taping, Dry Needling, Joint mobilization, Spinal mobilization, Cryotherapy, and Moist heat.  PLAN FOR NEXT SESSION: consider TPDN L lumbar.  Work towards Standing as tolerated. Review FOTO; assess response to HEP; progress therex as indicated; use of modalities, manual therapy; and TPDN as indicated.   Zaiyah Sottile MS, PT 05/25/23 5:03 PM

## 2023-05-24 NOTE — Progress Notes (Unsigned)
Pharmacist Chemotherapy Monitoring - Initial Assessment    Anticipated start date: 05/31/23  The following has been reviewed per standard work regarding the patient's treatment regimen: The patient's diagnosis, treatment plan and drug doses, and organ/hematologic function Lab orders and baseline tests specific to treatment regimen  The treatment plan start date, drug sequencing, and pre-medications Prior authorization status  Patient's documented medication list, including drug-drug interaction screen and prescriptions for anti-emetics and supportive care specific to the treatment regimen The drug concentrations, fluid compatibility, administration routes, and timing of the medications to be used The patient's access for treatment and lifetime cumulative dose history, if applicable  The patient's medication allergies and previous infusion related reactions, if applicable   Changes made to treatment plan:  {changes made:25719}  Follow up needed:  {RX follow up:25721}   Richardean Sale, [facility], 05/24/2023  3:48 PM

## 2023-05-25 ENCOUNTER — Ambulatory Visit: Payer: Medicare HMO

## 2023-05-25 ENCOUNTER — Other Ambulatory Visit: Payer: Medicare HMO

## 2023-05-25 DIAGNOSIS — R293 Abnormal posture: Secondary | ICD-10-CM

## 2023-05-25 DIAGNOSIS — G8929 Other chronic pain: Secondary | ICD-10-CM | POA: Diagnosis not present

## 2023-05-25 DIAGNOSIS — M6281 Muscle weakness (generalized): Secondary | ICD-10-CM | POA: Diagnosis not present

## 2023-05-25 DIAGNOSIS — M5442 Lumbago with sciatica, left side: Secondary | ICD-10-CM | POA: Diagnosis not present

## 2023-05-27 ENCOUNTER — Ambulatory Visit: Payer: Medicare HMO

## 2023-05-27 ENCOUNTER — Telehealth: Payer: Self-pay

## 2023-05-27 NOTE — Telephone Encounter (Signed)
Patient was seen with Dr. Arbutus Ped on 1/23.  Patient is starting new medication Revlimid (lenalidomide) soon.  Informed patient that the company updated their website and IT issue occurred and we can no longer reach them. Informed patient I would let her know when it is resolved.

## 2023-05-27 NOTE — Therapy (Incomplete)
OUTPATIENT PHYSICAL THERAPY THORACOLUMBAR TREATMENT    Patient Name: Joanna Ray MRN: [id number] DOB:February 19, 1951, 73 y.o., female Today's Date: 05/27/2023  END OF SESSION:       Past Medical History:  Diagnosis Date   Anxiety    Arthritis    Cancer (HCC) 2021   multiple myloma Right Arm   Cataract    GERD (gastroesophageal reflux disease)    Hyperlipidemia    Hypertension    Urticaria    Past Surgical History:  Procedure Laterality Date   CATARACT EXTRACTION, BILATERAL Bilateral    DILATION AND CURETTAGE OF UTERUS     Patient Active Problem List   Diagnosis Date Noted   Multiple myeloma (HCC) 05/20/2023   Status post total shoulder arthroplasty, right 01/17/2023   History of plasmacytoma 01/17/2023   Angioedema 01/21/2019   Vertigo 09/28/2016   Hyperlipidemia 09/25/2011   Hypertension     PCP: [name], MD  REFERRING PROVIDER: [name], MD  REFERRING DIAG: M54.9 (ICD-10-CM) - Back pain   Rationale for Evaluation and Treatment: Rehabilitation  THERAPY DIAG:  No diagnosis found.  ONSET DATE: 2 months  SUBJECTIVE:                                                                                                                                                                                           SUBJECTIVE STATEMENT: Pt has found her HEP to be helpful especially in the AM or after prolonged sitting with reducing pain and improving mobility.    EVAL: Insidious onset of L low back pain approx 2 months ago which now radiates down the back of her L leg to the knee. Occasionally has tingling along the lateral aspect of her L calf.   PERTINENT HISTORY:  High BMI, arthritis  PAIN:  Are you having pain? Yes: NPRS scale: 5/10.  Pain location: L low back  Pain description: ache Aggravating factors: Worse when 1st wakes up, extended walking and standing greaterthan 5 mins Relieving factors: Hot soak in the tub, tylenol, muscle relaxer,  sitting  PRECAUTIONS: None  RED FLAGS: None   WEIGHT BEARING RESTRICTIONS: No  FALLS:  Has patient fallen in last 6 months? No  LIVING ENVIRONMENT: Lives with: lives with their family Lives in: House/apartment No issue accessing or with mobility in home  OCCUPATION: Part-time: Teach challenged adults, used to work at [employer]   PLOF: Independent  PATIENT GOALS: To decrease the pain. I want to be able to walk with less pain, no pain ! Grocery store, work.   NEXT MD VISIT: March 2025.   OBJECTIVE:  Note: Objective measures were completed  at Evaluation unless otherwise noted.  DIAGNOSTIC FINDINGS:  LUMBAR 03/18/23: IMPRESSION: 1. Progressive lower lumbar spondylosis and facet hypertrophy greatest at L4-5 and L5-S1. 2. Grade 1 degenerative anterolisthesis of L3 on L4. 3. No acute bony abnormality.  PATIENT SURVEYS:  FOTO: Perceived function  41%, predicted  55%   COGNITION: Overall cognitive status: Within functional limits for tasks assessed     SENSATION: WFL  MUSCLE LENGTH: Hamstrings: Right NT deg; Left NT deg Maisie Fus test: Right NT deg; Left NT deg  POSTURE:  Lateral R shift of shoulder on pelvis  PALPATION: Not significantly tender to palpation  LUMBAR ROM:   AROM eval 05/25/23  Flexion Full, p upon return   Extension Markedly limited, p Min limited, decreasing pain with reps  Right lateral flexion Full, no p   Left lateral flexion Marked limited, p Markedly limited, P  Right rotation Full, min p   Left rotation Min limited, p   P= concordant pain  (Blank rows = not tested)  LOWER EXTREMITY ROM:     Grossly WNLs Active  Right eval Left eval  Hip flexion    Hip extension    Hip abduction    Hip adduction    Hip internal rotation    Hip external rotation    Knee flexion    Knee extension    Ankle dorsiflexion    Ankle plantarflexion    Ankle inversion    Ankle eversion     (Blank rows = not tested)  LOWER EXTREMITY MMT:    Myotome  screen negative equal bilat MMT Right eval Left eval  Hip flexion    Hip extension    Hip abduction    Hip adduction    Hip internal rotation    Hip external rotation    Knee flexion    Knee extension    Ankle dorsiflexion    Ankle plantarflexion    Ankle inversion    Ankle eversion     (Blank rows = not tested)  LUMBAR SPECIAL TESTS:  Straight leg raise test: Negative and Slump test: Negative  FUNCTIONAL TESTS:  NT  GAIT: Distance walked: 200' Assistive device utilized: None Level of assistance: Complete Independence Comments: Antalgic gait pattern over L LE  TREATMENT DATE:  [facility] Adult PT Treatment:                                                DATE: 05/27/23 Therapeutic Exercise: Nustep L5 UE/LE x 6 minutes Lateral shifting at wall L and R LTR  Pilates circle on thighs press 15x SLR with Pilates Circle x 10, with L LE aggravated L low back Palloff press 2x10 RTB Manual Therapy: Grade 3 UPAs to the bilat lumbar vertebrae Self Care: Use of tennis ball for on wall for massage with pt completing and returning demonstrationTherapeutic Exercise:  Manual Therapy: *** Neuromuscular re-ed: *** Therapeutic Activity: *** Modalities: *** Self Care: ***  [facility] Adult PT Treatment:                                                DATE: 05/19/23 Therapeutic Exercise: Nustep L5 UE/LE x 6 minutes Lateral shifting at wall L and R LTR  Pilates circle on thighs press  15x SLR with Pilates Circle x 10, with L LE aggravated L low back Palloff press 2x10 RTB Manual Therapy: Grade 3 UPAs to the bilat lumbar vertebrae Self Care: Use of tennis ball for on wall for massage with pt completing and returning demonstration  [facility] Adult PT Treatment:                                                DATE: 05/17/23 Therapeutic Exercise: Nustep L5 UE/LE x 6 minutes Seated lumbar flexion forward and lateral with ball  PPT x 10 Added arm press into the Pilates circle on thighs  Partial  bridge with Pilates circle  SLR with Circle x 10  Piriformis knee push and pull x 5 each side  Sit to stand 5 lbs x 10 , tried 10 lbs painful, had mild pain with 5 lb as well Wall for standing support : chest press x 10 Pilates ring - noted diff with Rt UE and shift pelvis to L   Lateral shift to Rt , painful Supine hamstring stretch 30 sec x 3   Manual Therapy  Manual pressure to L L5-SI and glute medius/max in sidelying Mentioned Dry needling as an option for her if symptoms warrant.   [facility] Adult PT Treatment:                                                DATE: 05/13/23 Therapeutic Exercise: Nustep L3 UE/LE x 5 minutes Seated lumbar flexion forward and lateral with ball  PPT x 10 Clam with GTB  Bridge with GTB 10 x 2  Supine PPT with GTB marching  SKTC x 4  LTR x 10 Supine Hip flexor stretch Right - upon sitting up pt experienced pain into right buttock  SKTC  Supine figure 4 push and pull bilat                                                                                                                      PATIENT EDUCATION:  Education details: Eval findings, POC, HEP, self care  Person educated: Patient Education method: Explanation, Demonstration, Tactile cues, Verbal cues, and Handouts Education comprehension: verbalized understanding, returned demonstration, verbal cues required, and tactile cues required  HOME EXERCISE PROGRAM: Access Code: [email or web address] URL: [email or web address] Date: 05/06/2023 Prepared by: Joellyn Rued  Exercises - Right Standing Lateral Shift Correction at Wall - Hold  - 2-3 x daily - 7 x weekly - 1 sets - 10 reps - Seated Flexion Stretch with Swiss Ball  - 3 x daily - 7 x weekly - 1 sets - 10-20 reps - 5-20 hold - Hooklying Single Knee to Chest  - 3 x daily - 7 x weekly - 3 sets -  3-5 reps - 10-20 hold - Supine Lower Trunk Rotation  - 1 x daily - 7 x weekly - 1 sets - 10 reps - Supine Figure 4 Piriformis Stretch  - 1 x daily - 7 x  weekly - 3 sets - 10 reps - Supine Transversus Abdominis Bracing - Hands on Stomach  - 2 x daily - 7 x weekly - 3 sets - 10 reps - 3 hold - Supine March  - 1 x daily - 7 x weekly - 2 sets - 10 reps - Supine Bridge  - 1 x daily - 7 x weekly - 2 sets - 10 reps - 5 hold - Hooklying Clamshell with Resistance  - 1 x daily - 7 x weekly - 2 sets - 10 reps - 3 hold  ASSESSMENT:  CLINICAL IMPRESSION: PT was completed for lumbar mobility per mobilizations, therex, and tennis ball massage with wall. Core strength was also completed with palloff press initiated. Pt's pain fluctuated during the session, but did not increase overall above 5/10. Pt reports improvement in pain since the start of PT. Trunk ext ROM has improved while L sidebending continues to be markedly limited. Pt will continue to benefit from skilled PT to address impairments for improved back function with minimized pain. Complete FOTO the next PT session.    EVAL: Patient is a 73 y.o. female who was seen today for physical therapy evaluation and treatment for M54.9 (ICD-10-CM) - Back pain. Pt presents with abnormal postural presentation with a R lateral shift of shoulder on pelvis. Pt reports she has not been told in the past she has an issue with scoliosis nor has past imaging indicated it. 03/18/23 Lumbar xray does reveal  progressive lower lumbar spondylosis and facet hypertrophy greatest at L4-5 and L5-S1, and Grade 1 degenerative anterolisthesis of L3 on L4. Pt's trunk motions are limited most significantly with ext and L SB, and her pain is increased with extended standing and walking. Pt will benefit from skilled PT 2w6 to address impairments to optimize back function with less pain.   OBJECTIVE IMPAIRMENTS: decreased activity tolerance, difficulty walking, decreased ROM, decreased strength, postural dysfunction, obesity, and pain.   ACTIVITY LIMITATIONS: carrying, lifting, bending, standing, sleeping, and locomotion  level  PARTICIPATION LIMITATIONS: meal prep, cleaning, laundry, shopping, community activity, and occupation  PERSONAL FACTORS: Fitness, Past/current experiences, Time since onset of injury/illness/exacerbation, and 1 comorbidity: High BMI  are also affecting patient's functional outcome.   REHAB POTENTIAL: Good  CLINICAL DECISION MAKING: Evolving/moderate complexity  EVALUATION COMPLEXITY: Moderate   GOALS:  SHORT TERM GOALS: Target date: 05/06/22  Pt will be Ind in an initial HEP  Baseline:started Goal status: MET  2.  Pt will voice understanding of measures to assist in pain reduction  Baseline: started 05/13/23: pacing, breaks, HEP Goal status: MET   LONG TERM GOALS: Target date: 06/03/22  Pt will be Ind in a final HEP to maintain achieved LOF  Baseline: started Goal status: INITIAL  2.  Pt will report a 50% or greater decrease in low back pain for improved function, sleep, and QOL Baseline: 0-10/10 05/13/23: pt reports improved morning pain with HEP and pacing.  05/25/23: Pt estimates 40% improvement Goal status: Improving  3.  Pt's trunk ext and L SB will improve to mod or less limitation for improved trunk function and as indication of improved pain Baseline: marked limitation 05/25/23: see flow sheets Goal status: Improving  4.  Pt will report an increased standing and walking tolerance  to 15 mins or greater for improved function Baseline: 5 mins 05/13/23: standing 10 min, walking 5 min still  Goal status: ONGOING  5.  Pt's FOTO score will improved to the predicted value of 55% as indication of improved function  Baseline: 41% Goal status: INITIAL  PLAN:  PT FREQUENCY: 2x/week  PT DURATION: 6 weeks  PLANNED INTERVENTIONS: 97164- PT Re-evaluation, 97110-Therapeutic exercises, 97530- Therapeutic activity, 97535- Self Care, 44010- Manual therapy, [id number]- Aquatic Therapy, [id number]- Electrical stimulation (manual), [id number]- Ultrasound, Patient/Family education, Taping,  Dry Needling, Joint mobilization, Spinal mobilization, Cryotherapy, and Moist heat.  PLAN FOR NEXT SESSION: consider TPDN L lumbar.  Work towards Standing as tolerated. Review FOTO; assess response to HEP; progress therex as indicated; use of modalities, manual therapy; and TPDN as indicated.   Dorene Bruni MS, PT 05/27/23 6:06 AM

## 2023-05-28 ENCOUNTER — Inpatient Hospital Stay: Payer: Medicare HMO

## 2023-05-28 ENCOUNTER — Other Ambulatory Visit: Payer: Self-pay

## 2023-05-28 ENCOUNTER — Encounter: Payer: Self-pay | Admitting: Internal Medicine

## 2023-05-28 DIAGNOSIS — C9000 Multiple myeloma not having achieved remission: Secondary | ICD-10-CM

## 2023-05-28 MED ORDER — LENALIDOMIDE 25 MG PO CAPS: 25.0000 mg | ORAL_CAPSULE | Freq: Every day | ORAL | 0 refills | Status: DC

## 2023-05-28 NOTE — Telephone Encounter (Signed)
Tried to reach patient.  LVM stating that Revlimid authorization was sent to pharmacy. If any questions please return the call.

## 2023-05-28 NOTE — Telephone Encounter (Signed)
Called pt b/c she didn't show for her edu appt.  She was out of town at a funeral & will have to r/s.  Message to schedulers/MD/RN.

## 2023-05-29 ENCOUNTER — Other Ambulatory Visit: Payer: Self-pay

## 2023-05-31 ENCOUNTER — Encounter: Payer: Self-pay | Admitting: Internal Medicine

## 2023-05-31 ENCOUNTER — Other Ambulatory Visit: Payer: Self-pay | Admitting: Physician Assistant

## 2023-05-31 ENCOUNTER — Inpatient Hospital Stay: Payer: Medicare HMO | Admitting: Internal Medicine

## 2023-05-31 ENCOUNTER — Inpatient Hospital Stay: Payer: Medicare HMO

## 2023-05-31 ENCOUNTER — Telehealth: Payer: Self-pay | Admitting: Internal Medicine

## 2023-05-31 NOTE — Therapy (Incomplete)
OUTPATIENT PHYSICAL THERAPY THORACOLUMBAR TREATMENT    Patient Name: Joanna Ray MRN: [id number] DOB:[birth year], 73 y.o., female Today's Date: 05/31/2023  END OF SESSION:       Past Medical History:  Diagnosis Date   Anxiety    Arthritis    Cancer (HCC) 2021   multiple myloma Right Arm   Cataract    GERD (gastroesophageal reflux disease)    Hyperlipidemia    Hypertension    Urticaria    Past Surgical History:  Procedure Laterality Date   CATARACT EXTRACTION, BILATERAL Bilateral    DILATION AND CURETTAGE OF UTERUS     Patient Active Problem List   Diagnosis Date Noted   Multiple myeloma (HCC) 05/20/2023   Status post total shoulder arthroplasty, right 01/17/2023   History of plasmacytoma 01/17/2023   Angioedema 01/21/2019   Vertigo 09/28/2016   Hyperlipidemia 09/25/2011   Hypertension     PCP: [name], MD  REFERRING PROVIDER: [name], MD  REFERRING DIAG: M54.9 (ICD-10-CM) - Back pain   Rationale for Evaluation and Treatment: Rehabilitation  THERAPY DIAG:  No diagnosis found.  ONSET DATE: 2 months  SUBJECTIVE:                                                                                                                                                                                           SUBJECTIVE STATEMENT: Pt has found her HEP to be helpful especially in the AM or after prolonged sitting with reducing pain and improving mobility.    EVAL: Insidious onset of L low back pain approx 2 months ago which now radiates down the back of her L leg to the knee. Occasionally has tingling along the lateral aspect of her L calf.   PERTINENT HISTORY:  High BMI, arthritis  PAIN:  Are you having pain? Yes: NPRS scale: 5/10.  Pain location: L low back  Pain description: ache Aggravating factors: Worse when 1st wakes up, extended walking and standing greaterthan 5 mins Relieving factors: Hot soak in the tub, tylenol, muscle relaxer,  sitting  PRECAUTIONS: None  RED FLAGS: None   WEIGHT BEARING RESTRICTIONS: No  FALLS:  Has patient fallen in last 6 months? No  LIVING ENVIRONMENT: Lives with: lives with their family Lives in: House/apartment No issue accessing or with mobility in home  OCCUPATION: Part-time: Teach challenged adults, used to work at [employer]   PLOF: Independent  PATIENT GOALS: To decrease the pain. I want to be able to walk with less pain, no pain ! Grocery store, work.   NEXT MD VISIT: March 2025.   OBJECTIVE:  Note: Objective measures were completed  at Evaluation unless otherwise noted.  DIAGNOSTIC FINDINGS:  LUMBAR 03/18/23: IMPRESSION: 1. Progressive lower lumbar spondylosis and facet hypertrophy greatest at L4-5 and L5-S1. 2. Grade 1 degenerative anterolisthesis of L3 on L4. 3. No acute bony abnormality.  PATIENT SURVEYS:  FOTO: Perceived function  41%, predicted  55%   COGNITION: Overall cognitive status: Within functional limits for tasks assessed     SENSATION: WFL  MUSCLE LENGTH: Hamstrings: Right NT deg; Left NT deg Maisie Fus test: Right NT deg; Left NT deg  POSTURE:  Lateral R shift of shoulder on pelvis  PALPATION: Not significantly tender to palpation  LUMBAR ROM:   AROM eval 05/25/23  Flexion Full, p upon return   Extension Markedly limited, p Min limited, decreasing pain with reps  Right lateral flexion Full, no p   Left lateral flexion Marked limited, p Markedly limited, P  Right rotation Full, min p   Left rotation Min limited, p   P= concordant pain  (Blank rows = not tested)  LOWER EXTREMITY ROM:     Grossly WNLs Active  Right eval Left eval  Hip flexion    Hip extension    Hip abduction    Hip adduction    Hip internal rotation    Hip external rotation    Knee flexion    Knee extension    Ankle dorsiflexion    Ankle plantarflexion    Ankle inversion    Ankle eversion     (Blank rows = not tested)  LOWER EXTREMITY MMT:    Myotome  screen negative equal bilat MMT Right eval Left eval  Hip flexion    Hip extension    Hip abduction    Hip adduction    Hip internal rotation    Hip external rotation    Knee flexion    Knee extension    Ankle dorsiflexion    Ankle plantarflexion    Ankle inversion    Ankle eversion     (Blank rows = not tested)  LUMBAR SPECIAL TESTS:  Straight leg raise test: Negative and Slump test: Negative  FUNCTIONAL TESTS:  NT  GAIT: Distance walked: 200' Assistive device utilized: None Level of assistance: Complete Independence Comments: Antalgic gait pattern over L LE  TREATMENT DATE:  [facility] Adult PT Treatment:                                                DATE: 06/01/23 Therapeutic Exercise: Nustep L5 UE/LE x 6 minutes Lateral shifting at wall L and R LTR  Pilates circle on thighs press 15x SLR with Pilates Circle x 10, with L LE aggravated L low back Palloff press 2x10 RTB Manual Therapy: Grade 3 UPAs to the bilat lumbar vertebrae Self Care: Use of tennis ball for on wall for massage with pt completing and returning demonstrationTherapeutic Exercise:  Manual Therapy: *** Neuromuscular re-ed: *** Therapeutic Activity: *** Modalities: *** Self Care: ***  [facility] Adult PT Treatment:                                                DATE: 05/19/23 Therapeutic Exercise: Nustep L5 UE/LE x 6 minutes Lateral shifting at wall L and R LTR  Pilates circle on thighs press  15x SLR with Pilates Circle x 10, with L LE aggravated L low back Palloff press 2x10 RTB Manual Therapy: Grade 3 UPAs to the bilat lumbar vertebrae Self Care: Use of tennis ball for on wall for massage with pt completing and returning demonstration  [facility] Adult PT Treatment:                                                DATE: 05/17/23 Therapeutic Exercise: Nustep L5 UE/LE x 6 minutes Seated lumbar flexion forward and lateral with ball  PPT x 10 Added arm press into the Pilates circle on thighs  Partial  bridge with Pilates circle  SLR with Circle x 10  Piriformis knee push and pull x 5 each side  Sit to stand 5 lbs x 10 , tried 10 lbs painful, had mild pain with 5 lb as well Wall for standing support : chest press x 10 Pilates ring - noted diff with Rt UE and shift pelvis to L   Lateral shift to Rt , painful Supine hamstring stretch 30 sec x 3   Manual Therapy  Manual pressure to L L5-SI and glute medius/max in sidelying Mentioned Dry needling as an option for her if symptoms warrant.   [facility] Adult PT Treatment:                                                DATE: 05/13/23 Therapeutic Exercise: Nustep L3 UE/LE x 5 minutes Seated lumbar flexion forward and lateral with ball  PPT x 10 Clam with GTB  Bridge with GTB 10 x 2  Supine PPT with GTB marching  SKTC x 4  LTR x 10 Supine Hip flexor stretch Right - upon sitting up pt experienced pain into right buttock  SKTC  Supine figure 4 push and pull bilat                                                                                                                      PATIENT EDUCATION:  Education details: Eval findings, POC, HEP, self care  Person educated: Patient Education method: Explanation, Demonstration, Tactile cues, Verbal cues, and Handouts Education comprehension: verbalized understanding, returned demonstration, verbal cues required, and tactile cues required  HOME EXERCISE PROGRAM: Access Code: [email or web address] URL: [email or web address] Date: 05/06/2023 Prepared by: Joellyn Rued  Exercises - Right Standing Lateral Shift Correction at Wall - Hold  - 2-3 x daily - 7 x weekly - 1 sets - 10 reps - Seated Flexion Stretch with Swiss Ball  - 3 x daily - 7 x weekly - 1 sets - 10-20 reps - 5-20 hold - Hooklying Single Knee to Chest  - 3 x daily - 7 x weekly - 3 sets -  3-5 reps - 10-20 hold - Supine Lower Trunk Rotation  - 1 x daily - 7 x weekly - 1 sets - 10 reps - Supine Figure 4 Piriformis Stretch  - 1 x daily - 7 x  weekly - 3 sets - 10 reps - Supine Transversus Abdominis Bracing - Hands on Stomach  - 2 x daily - 7 x weekly - 3 sets - 10 reps - 3 hold - Supine March  - 1 x daily - 7 x weekly - 2 sets - 10 reps - Supine Bridge  - 1 x daily - 7 x weekly - 2 sets - 10 reps - 5 hold - Hooklying Clamshell with Resistance  - 1 x daily - 7 x weekly - 2 sets - 10 reps - 3 hold  ASSESSMENT:  CLINICAL IMPRESSION: PT was completed for lumbar mobility per mobilizations, therex, and tennis ball massage with wall. Core strength was also completed with palloff press initiated. Pt's pain fluctuated during the session, but did not increase overall above 5/10. Pt reports improvement in pain since the start of PT. Trunk ext ROM has improved while L sidebending continues to be markedly limited. Pt will continue to benefit from skilled PT to address impairments for improved back function with minimized pain. Complete FOTO the next PT session.    EVAL: Patient is a 73 y.o. female who was seen today for physical therapy evaluation and treatment for M54.9 (ICD-10-CM) - Back pain. Pt presents with abnormal postural presentation with a R lateral shift of shoulder on pelvis. Pt reports she has not been told in the past she has an issue with scoliosis nor has past imaging indicated it. 03/18/23 Lumbar xray does reveal  progressive lower lumbar spondylosis and facet hypertrophy greatest at L4-5 and L5-S1, and Grade 1 degenerative anterolisthesis of L3 on L4. Pt's trunk motions are limited most significantly with ext and L SB, and her pain is increased with extended standing and walking. Pt will benefit from skilled PT 2w6 to address impairments to optimize back function with less pain.   OBJECTIVE IMPAIRMENTS: decreased activity tolerance, difficulty walking, decreased ROM, decreased strength, postural dysfunction, obesity, and pain.   ACTIVITY LIMITATIONS: carrying, lifting, bending, standing, sleeping, and locomotion  level  PARTICIPATION LIMITATIONS: meal prep, cleaning, laundry, shopping, community activity, and occupation  PERSONAL FACTORS: Fitness, Past/current experiences, Time since onset of injury/illness/exacerbation, and 1 comorbidity: High BMI  are also affecting patient's functional outcome.   REHAB POTENTIAL: Good  CLINICAL DECISION MAKING: Evolving/moderate complexity  EVALUATION COMPLEXITY: Moderate   GOALS:  SHORT TERM GOALS: Target date: 05/06/22  Pt will be Ind in an initial HEP  Baseline:started Goal status: MET  2.  Pt will voice understanding of measures to assist in pain reduction  Baseline: started 05/13/23: pacing, breaks, HEP Goal status: MET   LONG TERM GOALS: Target date: 06/03/22  Pt will be Ind in a final HEP to maintain achieved LOF  Baseline: started Goal status: INITIAL  2.  Pt will report a 50% or greater decrease in low back pain for improved function, sleep, and QOL Baseline: 0-10/10 05/13/23: pt reports improved morning pain with HEP and pacing.  05/25/23: Pt estimates 40% improvement Goal status: Improving  3.  Pt's trunk ext and L SB will improve to mod or less limitation for improved trunk function and as indication of improved pain Baseline: marked limitation 05/25/23: see flow sheets Goal status: Improving  4.  Pt will report an increased standing and walking tolerance  to 15 mins or greater for improved function Baseline: 5 mins 05/13/23: standing 10 min, walking 5 min still  Goal status: ONGOING  5.  Pt's FOTO score will improved to the predicted value of 55% as indication of improved function  Baseline: 41% Goal status: INITIAL  PLAN:  PT FREQUENCY: 2x/week  PT DURATION: 6 weeks  PLANNED INTERVENTIONS: 97164- PT Re-evaluation, 97110-Therapeutic exercises, 97530- Therapeutic activity, 97535- Self Care, 16109- Manual therapy, [id number]- Aquatic Therapy, [id number]- Electrical stimulation (manual), [id number]- Ultrasound, Patient/Family education, Taping,  Dry Needling, Joint mobilization, Spinal mobilization, Cryotherapy, and Moist heat.  PLAN FOR NEXT SESSION: consider TPDN L lumbar.  Work towards Standing as tolerated. Review FOTO; assess response to HEP; progress therex as indicated; use of modalities, manual therapy; and TPDN as indicated.   Nychelle Cassata MS, PT 05/31/23 11:38 AM

## 2023-06-01 ENCOUNTER — Ambulatory Visit: Payer: Medicare HMO | Admitting: Internal Medicine

## 2023-06-01 ENCOUNTER — Ambulatory Visit: Payer: Medicare HMO

## 2023-06-01 ENCOUNTER — Ambulatory Visit: Payer: Medicare HMO | Admitting: Physician Assistant

## 2023-06-01 NOTE — Progress Notes (Signed)
Pharmacist Chemotherapy Monitoring - Initial Assessment    Anticipated start date: 06/08/23   The following has been reviewed per standard work regarding the patient's treatment regimen: The patient's diagnosis, treatment plan and drug doses, and organ/hematologic function Lab orders and baseline tests specific to treatment regimen  The treatment plan start date, drug sequencing, and pre-medications Prior authorization status  Patient's documented medication list, including drug-drug interaction screen and prescriptions for anti-emetics and supportive care specific to the treatment regimen The drug concentrations, fluid compatibility, administration routes, and timing of the medications to be used The patient's access for treatment and lifetime cumulative dose history, if applicable  The patient's medication allergies and previous infusion related reactions, if applicable   Changes made to treatment plan:  N/A  Follow up needed:  T&S and Phenotyping will need to be drawn prior to dara   Janeice Robinson, [facility], 06/01/2023  12:38 PM

## 2023-06-02 ENCOUNTER — Ambulatory Visit: Payer: Medicare HMO

## 2023-06-02 ENCOUNTER — Telehealth: Payer: Self-pay | Admitting: Pharmacist

## 2023-06-02 NOTE — Telephone Encounter (Signed)
 Oral Chemotherapy Pharmacist Encounter   Attempted to reach patient to provide update and offer for initial counseling on oral medication: Revlimid  (lenalidomide ).   Per [facility], Revlimid  is scheduled to deliver to patient's home today, 06/02/23.  No answer. Left voicemail for patient to call back for initial counseling session.  Asberry Macintosh, PharmD, BCPS, BCOP Hematology/Oncology Clinical Pharmacist [facility] and [facility] Oral Chemotherapy Navigation Clinics [phone] 06/02/2023 10:13 AM

## 2023-06-03 ENCOUNTER — Ambulatory Visit: Payer: Medicare HMO

## 2023-06-03 ENCOUNTER — Telehealth: Payer: Self-pay | Admitting: Internal Medicine

## 2023-06-03 ENCOUNTER — Other Ambulatory Visit (HOSPITAL_COMMUNITY): Payer: Self-pay

## 2023-06-03 NOTE — Telephone Encounter (Signed)
 LVM with patient that we have forwarded this question to the pharmacy at On[facility] to see what they recommend, and I will call her when we have an answer.

## 2023-06-03 NOTE — Telephone Encounter (Signed)
 Will we need an appointment to discuss changing medication with possible lab appointment prior?

## 2023-06-03 NOTE — Telephone Encounter (Addendum)
 Oral Chemotherapy Pharmacist Encounter   Attempted to reach patient to provide update and offer for initial counseling on oral medication: Revlimid  (lenalidomide ).   Patient states she was walking into a dentist appt and unable to talk at this time. Will see patient when she is in clinic on 06/07/23 for her chemo education class.   Asberry Macintosh, PharmD, BCPS, BCOP Hematology/Oncology Clinical Pharmacist [facility] and [facility] Oral Chemotherapy Navigation Clinics [phone] 06/03/2023 3:26 PM

## 2023-06-03 NOTE — Telephone Encounter (Signed)
 Copied from CRM [id number]. Topic: Clinical - Medication Question >> Jun 02, 2023  1:12 PM Delon T wrote: Reason for RMF:Nwrnonhpdu Dr Gatha putting patient on blood thinner that would have an interaction with cholesterol medication she is on now, need to change it, please call patient [phone]

## 2023-06-04 ENCOUNTER — Ambulatory Visit: Payer: Medicare HMO

## 2023-06-06 NOTE — Progress Notes (Signed)
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   PRIOR THERAPY: 1) Palliative radiotherapy to the plasmacytoma of the proximal right humerus under the care of Dr. Roselind Messier. 2) Status post tumor resection from the proximal humerus with reconstruction under the care of Dr. Jana Half at [facility] on May 01, 2021.  CURRENT THERAPY: Systemic treatment with subcutaneous daratumumab, subcutaneous Velcade on weekly basis, Revlimid 25 mg p.o. for 14 days every 3 weeks in addition to Decadron 20 mg p.o. weekly during the course of chemotherapy. First dose June 08, 2023.   INTERVAL HISTORY: Joanna Ray 73 y.o. female returns to the clinic today for a follow up visit. In summary, the patient underwent radiation to a plasma cytoma of the proximal right humerus in February 2022. She underwent radiation therapy and subsequently had surgery in June 2023. Since then, the patient has been under observation. Recently, there has been a noted increase in her IgG and light chain protein levels, raising concerns of progression to multiple myeloma. A skeletal bone survey and PET scan were performed, revealing multiple areas of concern beyond the initial humerus site.   Therefore, she is here to start treatment. She is feeling well and does not have any questions. She has all of her medications with her today but she did not take any at this time.  She took her medications while in the exam room with me today.  She also tells me that there was some communication with her PCP about switching the warfarin to Eliquis 2.5 mg twice daily.  Therefore she has not taken her warfarin at this time.  She denies any new symptoms today.  Only concern is related to  back pain, particularly in the morning secondary to the metastatic bone lesions.  The patient still works for a few hours during the week.  She takes Tylenol but is not always effective.  She denies fevers, chills, night sweats, or unexplained weight loss. She denies any signs of symptoms of infection sore throat, nasal congestion, cough, shortness of breath, skin infections, diarrhea, or dysuria.    She is here for evaluation and repeat blood work before undergoing cycle #1.      MEDICAL HISTORY: Past Medical History:  Diagnosis Date   Anxiety    Arthritis    Cancer (HCC) 2021   multiple myloma Right Arm   Cataract    GERD (gastroesophageal reflux disease)    Hyperlipidemia    Hypertension    Urticaria     ALLERGIES:  is allergic to corn-containing products and shellfish allergy.  MEDICATIONS:  Current Outpatient Medications  Medication Sig Dispense Refill   apixaban (ELIQUIS) 2.5 MG TABS tablet Take 1 tablet (2.5 mg total) by mouth 2 (two) times daily. 60 tablet 2   HYDROcodone-acetaminophen (NORCO/VICODIN) 5-325 MG tablet Take 1 tablet by mouth every 6 (six) hours as needed for moderate pain (pain score 4-6). 20 tablet 0   acyclovir (ZOVIRAX) 400 MG tablet Take 1 tablet (400 mg total) by mouth 2 (two) times daily. 60 tablet 5   ALPRAZolam (XANAX) 0.5 MG tablet Take 1 tablet by mouth twice daily as needed for anxiety 60 tablet 5   aspirin 81 MG tablet Take 81 mg by mouth daily.     cholecalciferol (VITAMIN D) 1000  UNITS tablet Take 1,000 Units by mouth daily.     cyclobenzaprine (FLEXERIL) 10 MG tablet Take 1 tablet by mouth three times daily as needed for muscle spasm 90 tablet 0   dexamethasone (DECADRON) 4 MG tablet Take 5 tabs (20 mg) weekly the day after daratumumab for 12 weeks. Take with breakfast. 20 tablet 5   EPINEPHrine (EPIPEN 2-PAK) 0.3 mg/0.3 mL IJ SOAJ injection Inject 0.3 mg into the muscle as needed for anaphylaxis. 1 each PRN   fexofenadine (ALLEGRA) 180 MG  tablet Take 1 tablet (180 mg total) by mouth daily. 90 tablet 1   hydrocortisone valerate cream (WESTCORT) 0.2 % 1 app     lenalidomide (REVLIMID) 25 MG capsule Take 1 capsule (25 mg total) by mouth daily. Take 1 capsule (25 mg total) by mouth daily for 14 days.  Take 7 days off and repeat. 14 capsule 0   meclizine (ANTIVERT) 25 MG tablet Take 1 tablet (25 mg total) by mouth 3 (three) times daily as needed for dizziness. 30 tablet 0   ondansetron (ZOFRAN) 8 MG tablet Take 1 tablet (8 mg total) by mouth every 8 (eight) hours as needed for nausea or vomiting. 30 tablet 1   prochlorperazine (COMPAZINE) 10 MG tablet Take 1 tablet (10 mg total) by mouth every 6 (six) hours as needed for nausea or vomiting. 30 tablet 1   rosuvastatin (CRESTOR) 20 MG tablet TAKE 1/2 TABLET ONE TIME DAILY 45 tablet 3   valsartan-hydrochlorothiazide (DIOVAN-HCT) 160-25 MG tablet TAKE 1 TABLET EVERY DAY 90 tablet 3   vitamin E 100 UNIT capsule Take 100 Units by mouth daily.     No current facility-administered medications for this visit.    SURGICAL HISTORY:  Past Surgical History:  Procedure Laterality Date   CATARACT EXTRACTION, BILATERAL Bilateral    DILATION AND CURETTAGE OF UTERUS      REVIEW OF SYSTEMS:   Review of Systems  Constitutional: Negative for appetite change, chills, fatigue, fever and unexpected weight change.  HENT:   Negative for mouth sores, nosebleeds, sore throat and trouble swallowing.   Eyes: Negative for eye problems and icterus.  Respiratory: Negative for cough, hemoptysis, shortness of breath and wheezing.   Cardiovascular: Negative for chest pain and leg swelling.  Gastrointestinal: Negative for abdominal pain, constipation, diarrhea, nausea and vomiting.  Genitourinary: Negative for bladder incontinence, difficulty urinating, dysuria, frequency and hematuria.   Musculoskeletal: Positive for back pain. Negative for gait problem, neck pain and neck stiffness.  Skin: Negative for itching  and rash.  Neurological: Negative for dizziness, extremity weakness, gait problem, headaches, light-headedness and seizures.  Hematological: Negative for adenopathy. Does not bruise/bleed easily.  Psychiatric/Behavioral: Negative for confusion, depression and sleep disturbance. The patient is not nervous/anxious.     PHYSICAL EXAMINATION:  Blood pressure (!) 180/79, pulse 80, temperature 98 F (36.7 C), temperature source Temporal, resp. rate 16, weight 199 lb 1.6 oz (90.3 kg), SpO2 98%.  ECOG PERFORMANCE STATUS: 1  Physical Exam  Constitutional: Oriented to person, place, and time and well-developed, well-nourished, and in no distress.   HENT:  Head: Normocephalic and atraumatic.  Mouth/Throat: Oropharynx is clear and moist. No oropharyngeal exudate.  Eyes: Conjunctivae are normal. Right eye exhibits no discharge. Left eye exhibits no discharge. No scleral icterus.  Neck: Normal range of motion. Neck supple.  Cardiovascular: Normal rate, regular rhythm, normal heart sounds and intact distal pulses.   Pulmonary/Chest: Effort normal and breath sounds normal. No respiratory distress. No wheezes. No rales.  Abdominal: Soft. Bowel sounds are normal. Exhibits no distension and no mass. There is no tenderness.  Musculoskeletal: Normal range of motion. Exhibits no edema.  Lymphadenopathy:    No cervical adenopathy.  Neurological: Alert and oriented to person, place, and time. Exhibits normal muscle tone. Gait normal. Coordination normal.  Skin: Skin is warm and dry. No rash noted. Not diaphoretic. No erythema. No pallor.  Psychiatric: Mood, memory and judgment normal.  Vitals reviewed.  LABORATORY DATA: Lab Results  Component Value Date   WBC 5.3 06/08/2023   HGB 12.1 06/08/2023   HCT 38.0 06/08/2023   MCV 95.5 06/08/2023   PLT 238 06/08/2023      Chemistry      Component Value Date/Time   NA 140 05/20/2023 1009   NA 145 (H) 12/28/2018 1436   K 4.3 05/20/2023 1009   CL 104  05/20/2023 1009   CO2 30 05/20/2023 1009   BUN 21 05/20/2023 1009   BUN 15 12/28/2018 1436   CREATININE 1.02 (H) 05/20/2023 1009   CREATININE 1.10 (H) 01/14/2023 0908      Component Value Date/Time   CALCIUM 9.5 05/20/2023 1009   ALKPHOS 53 05/20/2023 1009   AST 19 05/20/2023 1009   ALT 13 05/20/2023 1009   BILITOT 0.7 05/20/2023 1009       RADIOGRAPHIC STUDIES:  No results found.   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  The patient had extensive work-up for multiple myeloma including a bone marrow biopsy and aspirate that showed only 6% plasma cells.  Her protein studies were still suspicious for underlying MGUS/multiple myeloma but the findings are not enough to call it multiple myeloma at this point.  The patient underwent radiotherapy to the plasmacytoma of the proximal right humerus. The skeletal bone survey at that time showed no other lytic bone lesion except the proximal right humerus.  Recent skeletal bone survey showed progression of the lytic lesion in the proximal humeral diaphysis with pathologic fracture.  She underwent surgical resection of the tumor with reconstruction under the care of Dr. Jana Half at [facility] on May 01, 2021.    The patient was found on recent protein study to have elevated IgG level.  She underwent a bone marrow biopsy and aspirate that showed 10-20% plasma cells. She had skeletal bone survey performed recently that showed new lytic lesions in the proximal left femoral shaft and midshaft of the right femur consistent with myelomatous involvement or other lytic metastasis.  There was also scattered areas of endosteal scalloping in the left humeral shaft concerning for myelomatous involvement. This was diagnosed in November 2024.   Therefore, Dr. Arbutus Ped recommended treatemnt with revlimid 25 mg 14 days on/7 days off,  daratumumab, and velcade subcutaneously weekly with decadron 20 mg weekly. The plan is to undergo 4 rounds then assessing for potential for if she is a candidate for autologous stem cell transplant   She is here for cycle #1 today. She started taking her medications this morning. I reached out to Dr. Arbutus Ped and pharmacy to clarify. The plan is to switch her warfarin to eliquis 2.5 mg BID. I have sent this to the pharmacy and discontinued her warfarin.   Labs were reviewed. Recommend she proceed with cycle #1 today as scheduled.   She will continue to take acylovir for viral prophylaxis. She was advised to start taking this. She also had not taken  her BP meds today. She will take this while in the clinic. She also took her decadron while in the exam room with me.   We will see her back for a follow up visit in 2-3 weeks with treatment.   For her back pain, she does have a lot of metastatic disease to the bone. It would be reasonable to prescribe norco once every 6 hours as needed for pain since she is still working and that is challenging for when she has back pain. She knows only to take this sparingly and not to take with hydrocodone.  Her allergy list says that she is allergic to corn containing products.  The patient states that she is taking Norco in the past.  She understands Norco contains the equivalent of 1 Tylenol and knows not to exceed the daily dose of Tylenol.  She knows the goal of Norco would not to be a long-term medication.  Can always radiate any painful metastatic bone lesions.  Right now her pain is mild.  The patient was advised to call immediately if he has any concerning symptoms in the interval. The patient voices understanding of current disease status and treatment options and is in agreement with the current care plan. All questions were answered. The patient knows to call the clinic with any problems, questions or concerns. We can certainly see the patient much sooner if  necessary   No orders of the defined types were placed in this encounter.    The total time spent in the appointment was 20-29 minutes  Rykar Lebleu L River Ambrosio, PA-C 06/08/23

## 2023-06-07 ENCOUNTER — Inpatient Hospital Stay: Payer: Medicare HMO | Attending: Internal Medicine

## 2023-06-07 DIAGNOSIS — Z5112 Encounter for antineoplastic immunotherapy: Secondary | ICD-10-CM | POA: Insufficient documentation

## 2023-06-07 DIAGNOSIS — Z79899 Other long term (current) drug therapy: Secondary | ICD-10-CM | POA: Insufficient documentation

## 2023-06-07 DIAGNOSIS — E785 Hyperlipidemia, unspecified: Secondary | ICD-10-CM | POA: Insufficient documentation

## 2023-06-07 DIAGNOSIS — I10 Essential (primary) hypertension: Secondary | ICD-10-CM | POA: Insufficient documentation

## 2023-06-07 DIAGNOSIS — R768 Other specified abnormal immunological findings in serum: Secondary | ICD-10-CM | POA: Insufficient documentation

## 2023-06-07 DIAGNOSIS — Z7982 Long term (current) use of aspirin: Secondary | ICD-10-CM | POA: Insufficient documentation

## 2023-06-07 DIAGNOSIS — C9030 Solitary plasmacytoma not having achieved remission: Secondary | ICD-10-CM | POA: Insufficient documentation

## 2023-06-07 DIAGNOSIS — Z923 Personal history of irradiation: Secondary | ICD-10-CM | POA: Insufficient documentation

## 2023-06-07 DIAGNOSIS — M549 Dorsalgia, unspecified: Secondary | ICD-10-CM | POA: Insufficient documentation

## 2023-06-07 DIAGNOSIS — Z7901 Long term (current) use of anticoagulants: Secondary | ICD-10-CM | POA: Insufficient documentation

## 2023-06-07 DIAGNOSIS — Z79624 Long term (current) use of inhibitors of nucleotide synthesis: Secondary | ICD-10-CM | POA: Insufficient documentation

## 2023-06-07 DIAGNOSIS — K219 Gastro-esophageal reflux disease without esophagitis: Secondary | ICD-10-CM | POA: Insufficient documentation

## 2023-06-07 DIAGNOSIS — D649 Anemia, unspecified: Secondary | ICD-10-CM | POA: Insufficient documentation

## 2023-06-07 DIAGNOSIS — Z7961 Long term (current) use of immunomodulator: Secondary | ICD-10-CM | POA: Insufficient documentation

## 2023-06-07 NOTE — Telephone Encounter (Signed)
 Oral Chemotherapy Pharmacist Encounter  I met with patient and patient's friend in clinic for overview of: Revlimid  (lenalidomide ) for the treatment of IgG multiple myeloma in conjunction with daratumumab , bortezomib  and dexamethasone , planned duration 4-6 cycles.   Counseled patient on administration, dosing, side effects, monitoring, drug-food interactions, safe handling, storage, and disposal.  Patient will take Revlimid  25mg  capsules, 1 capsule by mouth once daily, without regard to food, with a full glass of water.  Revlimid  will be given 14 days on, 7 days off, repeat every 21 days.  Patient will take dexamethasone  4mg  tablets, 5 tablets (20mg ) by mouth once weekly with breakfast the day after daratumumab  for 12 weeks.   Revlimid  start date: 06/08/23  Adverse effects of Revlimid  include but are not limited to: nausea, constipation, diarrhea, abdominal pain, rash, fatigue, drug fever, and decreased blood counts.   VTE PPX: discussed with patient that she may be switching to Eliquis , so to hold off on warfarin dose on 06/08/23 until she has discussed with Dr. Marguerita Shih the plan at her office visit appointment.  VZV PPX: patient has picked up acyclovir  400 mg tablets and knows to take BID while on therapy.  Reviewed with patient importance of keeping a medication schedule and plan for any missed doses. No barriers to medication adherence identified.  Medication reconciliation performed and medication/allergy list updated.  Insurance authorization for Revlimid  has been obtained.  Revlimid  prescription is being dispensed from [facility] pharmacy as it is a limited distribution medication.  All questions answered.  Joanna Ray voiced understanding and appreciation.   Medication education handout and medication calendar given to patient. Patient knows to call the office with questions or concerns. Oral Chemotherapy Clinic phone number provided to patient.   Jude Norton,  PharmD, BCPS, BCOP Hematology/Oncology Clinical Pharmacist Maryan Smalling and [facility] Oral Chemotherapy Navigation Clinics [phone] 06/07/2023 3:56 PM

## 2023-06-08 ENCOUNTER — Encounter: Payer: Self-pay | Admitting: Internal Medicine

## 2023-06-08 ENCOUNTER — Other Ambulatory Visit: Payer: Self-pay | Admitting: Physician Assistant

## 2023-06-08 ENCOUNTER — Ambulatory Visit: Payer: Medicare HMO | Admitting: Internal Medicine

## 2023-06-08 ENCOUNTER — Inpatient Hospital Stay: Payer: Medicare HMO

## 2023-06-08 ENCOUNTER — Inpatient Hospital Stay: Payer: Medicare HMO | Admitting: Physician Assistant

## 2023-06-08 ENCOUNTER — Other Ambulatory Visit: Payer: Medicare HMO

## 2023-06-08 VITALS — BP 177/87 | HR 81 | Resp 16

## 2023-06-08 VITALS — BP 180/79 | HR 80 | Temp 98.0°F | Resp 16 | Wt 199.1 lb

## 2023-06-08 DIAGNOSIS — D649 Anemia, unspecified: Secondary | ICD-10-CM | POA: Diagnosis not present

## 2023-06-08 DIAGNOSIS — Z7982 Long term (current) use of aspirin: Secondary | ICD-10-CM | POA: Diagnosis not present

## 2023-06-08 DIAGNOSIS — Z79624 Long term (current) use of inhibitors of nucleotide synthesis: Secondary | ICD-10-CM | POA: Diagnosis not present

## 2023-06-08 DIAGNOSIS — C9000 Multiple myeloma not having achieved remission: Secondary | ICD-10-CM | POA: Diagnosis not present

## 2023-06-08 DIAGNOSIS — Z7961 Long term (current) use of immunomodulator: Secondary | ICD-10-CM | POA: Diagnosis not present

## 2023-06-08 DIAGNOSIS — K219 Gastro-esophageal reflux disease without esophagitis: Secondary | ICD-10-CM | POA: Diagnosis not present

## 2023-06-08 DIAGNOSIS — C9030 Solitary plasmacytoma not having achieved remission: Secondary | ICD-10-CM | POA: Diagnosis not present

## 2023-06-08 DIAGNOSIS — Z7901 Long term (current) use of anticoagulants: Secondary | ICD-10-CM | POA: Diagnosis not present

## 2023-06-08 DIAGNOSIS — M549 Dorsalgia, unspecified: Secondary | ICD-10-CM | POA: Diagnosis not present

## 2023-06-08 DIAGNOSIS — Z79899 Other long term (current) drug therapy: Secondary | ICD-10-CM | POA: Diagnosis not present

## 2023-06-08 DIAGNOSIS — E785 Hyperlipidemia, unspecified: Secondary | ICD-10-CM | POA: Diagnosis not present

## 2023-06-08 DIAGNOSIS — Z923 Personal history of irradiation: Secondary | ICD-10-CM | POA: Diagnosis not present

## 2023-06-08 DIAGNOSIS — Z5112 Encounter for antineoplastic immunotherapy: Secondary | ICD-10-CM | POA: Diagnosis not present

## 2023-06-08 DIAGNOSIS — I10 Essential (primary) hypertension: Secondary | ICD-10-CM | POA: Diagnosis not present

## 2023-06-08 DIAGNOSIS — R768 Other specified abnormal immunological findings in serum: Secondary | ICD-10-CM | POA: Diagnosis not present

## 2023-06-08 LAB — CMP (CANCER CENTER ONLY)
ALT: 12 U/L (ref 0–44)
AST: 18 U/L (ref 15–41)
Albumin: 4 g/dL (ref 3.5–5.0)
Alkaline Phosphatase: 51 U/L (ref 38–126)
Anion gap: 5 (ref 5–15)
BUN: 23 mg/dL (ref 8–23)
CO2: 31 mmol/L (ref 22–32)
Calcium: 9.5 mg/dL (ref 8.9–10.3)
Chloride: 106 mmol/L (ref 98–111)
Creatinine: 1.07 mg/dL — ABNORMAL HIGH (ref 0.44–1.00)
GFR, Estimated: 55 mL/min — ABNORMAL LOW (ref >60)
Glucose, Bld: 101 mg/dL — ABNORMAL HIGH (ref 70–99)
Potassium: 4.2 mmol/L (ref 3.5–5.1)
Sodium: 142 mmol/L (ref 135–145)
Total Bilirubin: 0.6 mg/dL (ref 0.0–1.2)
Total Protein: 8.4 g/dL — ABNORMAL HIGH (ref 6.5–8.1)

## 2023-06-08 LAB — CBC WITH DIFFERENTIAL (CANCER CENTER ONLY)
Abs Immature Granulocytes: 0.06 10*3/uL (ref 0.00–0.07)
Basophils Absolute: 0.1 10*3/uL (ref 0.0–0.1)
Basophils Relative: 2 %
Eosinophils Absolute: 0.1 10*3/uL (ref 0.0–0.5)
Eosinophils Relative: 2 %
HCT: 38 % (ref 36.0–46.0)
Hemoglobin: 12.1 g/dL (ref 12.0–15.0)
Immature Granulocytes: 1 %
Lymphocytes Relative: 30 %
Lymphs Abs: 1.6 10*3/uL (ref 0.7–4.0)
MCH: 30.4 pg (ref 26.0–34.0)
MCHC: 31.8 g/dL (ref 30.0–36.0)
MCV: 95.5 fL (ref 80.0–100.0)
Monocytes Absolute: 0.5 10*3/uL (ref 0.1–1.0)
Monocytes Relative: 9 %
Neutro Abs: 3 10*3/uL (ref 1.7–7.7)
Neutrophils Relative %: 56 %
Platelet Count: 238 10*3/uL (ref 150–400)
RBC: 3.98 MIL/uL (ref 3.87–5.11)
RDW: 13.2 % (ref 11.5–15.5)
WBC Count: 5.3 10*3/uL (ref 4.0–10.5)
nRBC: 0 % (ref 0.0–0.2)

## 2023-06-08 LAB — TYPE AND SCREEN
ABO/RH(D): O POS
Antibody Screen: NEGATIVE

## 2023-06-08 LAB — PRETREATMENT RBC PHENOTYPE: DAT, IgG: NEGATIVE

## 2023-06-08 MED ORDER — HYDROCODONE-ACETAMINOPHEN 5-325 MG PO TABS
1.0000 | ORAL_TABLET | Freq: Four times a day (QID) | ORAL | 0 refills | Status: DC | PRN
Start: 2023-06-08 — End: 2023-07-06

## 2023-06-08 MED ORDER — BORTEZOMIB CHEMO SQ INJECTION 3.5 MG (2.5MG/ML)
1.3000 mg/m2 | Freq: Once | INTRAMUSCULAR | Status: AC
  Administered 2023-06-08: 2.75 mg via SUBCUTANEOUS
  Filled 2023-06-08: qty 1.1

## 2023-06-08 MED ORDER — DARATUMUMAB-HYALURONIDASE-FIHJ 1800-30000 MG-UT/15ML ~~LOC~~ SOLN
1800.0000 mg | Freq: Once | SUBCUTANEOUS | Status: AC
  Administered 2023-06-08: 1800 mg via SUBCUTANEOUS
  Filled 2023-06-08: qty 15

## 2023-06-08 MED ORDER — MONTELUKAST SODIUM 10 MG PO TABS
10.0000 mg | ORAL_TABLET | Freq: Once | ORAL | Status: AC
  Administered 2023-06-08: 10 mg via ORAL
  Filled 2023-06-08: qty 1

## 2023-06-08 MED ORDER — APIXABAN 2.5 MG PO TABS: 2.5000 mg | ORAL_TABLET | Freq: Two times a day (BID) | ORAL | 2 refills | Status: AC

## 2023-06-08 MED ORDER — DIPHENHYDRAMINE HCL 25 MG PO CAPS
50.0000 mg | ORAL_CAPSULE | Freq: Once | ORAL | Status: AC
  Administered 2023-06-08: 50 mg via ORAL
  Filled 2023-06-08: qty 2

## 2023-06-08 MED ORDER — ACETAMINOPHEN 325 MG PO TABS
650.0000 mg | ORAL_TABLET | Freq: Once | ORAL | Status: AC
  Administered 2023-06-08: 650 mg via ORAL
  Filled 2023-06-08: qty 2

## 2023-06-08 NOTE — Patient Instructions (Addendum)
[facility] - A DEPT OF [facility]  Discharge Instructions: Thank you for choosing [facility] to provide your oncology and hematology care.   If you have a lab appointment with the Cancer Center, please go directly to the Cancer Center and check in at the registration area.   Wear comfortable clothing and clothing appropriate for easy access to any Portacath or PICC line.   We strive to give you quality time with your provider. You may need to reschedule your appointment if you arrive late (15 or more minutes).  Arriving late affects you and other patients whose appointments are after yours.  Also, if you miss three or more appointments without notifying the office, you may be dismissed from the clinic at the provider's discretion.      For prescription refill requests, have your pharmacy contact our office and allow 72 hours for refills to be completed.    Today you received the following chemotherapy and/or immunotherapy agents Darzalex Faspro, Velcade      To help prevent nausea and vomiting after your treatment, we encourage you to take your nausea medication as directed.  BELOW ARE SYMPTOMS THAT SHOULD BE REPORTED IMMEDIATELY: *FEVER GREATER THAN 100.4 F (38 C) OR HIGHER *CHILLS OR SWEATING *NAUSEA AND VOMITING THAT IS NOT CONTROLLED WITH YOUR NAUSEA MEDICATION *UNUSUAL SHORTNESS OF BREATH *UNUSUAL BRUISING OR BLEEDING *URINARY PROBLEMS (pain or burning when urinating, or frequent urination) *BOWEL PROBLEMS (unusual diarrhea, constipation, pain near the anus) TENDERNESS IN MOUTH AND THROAT WITH OR WITHOUT PRESENCE OF ULCERS (sore throat, sores in mouth, or a toothache) UNUSUAL RASH, SWELLING OR PAIN  UNUSUAL VAGINAL DISCHARGE OR ITCHING   Items with * indicate a potential emergency and should be followed up as soon as possible or go to the Emergency Department if any problems should occur.  Please show the CHEMOTHERAPY ALERT CARD or  IMMUNOTHERAPY ALERT CARD at check-in to the Emergency Department and triage nurse.  Should you have questions after your visit or need to cancel or reschedule your appointment, please contact [facility] - [facility]  Dept: [phone]  and follow the prompts.  Office hours are 8:00 a.m. to 4:30 p.m. Monday - Friday. Please note that voicemails left after 4:00 p.m. may not be returned until the following business day.  We are closed weekends and major holidays. You have access to a nurse at all times for urgent questions. Please call the main number to the clinic Dept: [phone] and follow the prompts.   For any non-urgent questions, you may also contact your provider using MyChart. We now offer e-Visits for anyone 29 and older to request care online for non-urgent symptoms. For details visit [email or web address].   Also download the MyChart app! Go to the app store, search "MyChart", open the app, select [place], and log in with your MyChart username and password.  Daratumumab; Hyaluronidase Injection What is this medication? DARATUMUMAB; HYALURONIDASE (dar a toom ue mab; hye al ur ON i dase) treats multiple myeloma, a type of bone marrow cancer. Daratumumab works by blocking a protein that causes cancer cells to grow and multiply. This helps to slow or stop the spread of cancer cells. Hyaluronidase works by increasing the absorption of other medications in the body to help them work better. This medication may also be used treat amyloidosis, a condition that causes the buildup of a protein (amyloid) in  your body. It works by reducing the buildup of this protein, which decreases symptoms. It is a combination medication that contains a monoclonal antibody. This medicine may be used for other purposes; ask your health care provider or pharmacist if you have questions. COMMON BRAND NAME(S): DARZALEX FASPRO What should I tell my care team before I take  this medication? They need to know if you have any of these conditions: Heart disease Infection, such as chickenpox, cold sores, herpes, hepatitis B Lung or breathing disease An unusual or allergic reaction to daratumumab, hyaluronidase, other medications, foods, dyes, or preservatives Pregnant or trying to get pregnant Breast-feeding How should I use this medication? This medication is injected under the skin. It is given by your care team in a hospital or clinic setting. Talk to your care team about the use of this medication in children. Special care may be needed. Overdosage: If you think you have taken too much of this medicine contact a poison control center or emergency room at once. NOTE: This medicine is only for you. Do not share this medicine with others. What if I miss a dose? Keep appointments for follow-up doses. It is important not to miss your dose. Call your care team if you are unable to keep an appointment. What may interact with this medication? Interactions have not been studied. This list may not describe all possible interactions. Give your health care provider a list of all the medicines, herbs, non-prescription drugs, or dietary supplements you use. Also tell them if you smoke, drink alcohol, or use illegal drugs. Some items may interact with your medicine. What should I watch for while using this medication? Your condition will be monitored carefully while you are receiving this medication. This medication can cause serious allergic reactions. To reduce your risk, your care team may give you other medication to take before receiving this one. Be sure to follow the directions from your care team. This medication can affect the results of blood tests to match your blood type. These changes can last for up to 6 months after the final dose. Your care team will do blood tests to match your blood type before you start treatment. Tell all of your care team that you are being  treated with this medication before receiving a blood transfusion. This medication can affect the results of some tests used to determine treatment response; extra tests may be needed to evaluate response. Talk to your care team if you wish to become pregnant or think you are pregnant. This medication can cause serious birth defects if taken during pregnancy and for 3 months after the last dose. A reliable form of contraception is recommended while taking this medication and for 3 months after the last dose. Talk to your care team about effective forms of contraception. Do not breast-feed while taking this medication. What side effects may I notice from receiving this medication? Side effects that you should report to your care team as soon as possible: Allergic reactions--skin rash, itching, hives, swelling of the face, lips, tongue, or throat Heart rhythm changes--fast or irregular heartbeat, dizziness, feeling faint or lightheaded, chest pain, trouble breathing Infection--fever, chills, cough, sore throat, wounds that don't heal, pain or trouble when passing urine, general feeling of discomfort or being unwell Infusion reactions--chest pain, shortness of breath or trouble breathing, feeling faint or lightheaded Sudden eye pain or change in vision such as blurry vision, seeing halos around lights, vision loss Unusual bruising or bleeding Side effects that usually  do not require medical attention (report to your care team if they continue or are bothersome): Constipation Diarrhea Fatigue Nausea Pain, tingling, or numbness in the hands or feet Swelling of the ankles, hands, or feet This list may not describe all possible side effects. Call your doctor for medical advice about side effects. You may report side effects to FDA at 1-800-FDA-1088. Where should I keep my medication? This medication is given in a hospital or clinic. It will not be stored at home. NOTE: This sheet is a summary. It may  not cover all possible information. If you have questions about this medicine, talk to your doctor, pharmacist, or health care provider.  2024 Elsevier/Gold Standard (2021-08-19 00:00:00)  Bortezomib Injection What is this medication? BORTEZOMIB (bor TEZ oh mib) treats lymphoma. It may also be used to treat multiple myeloma, a type of bone marrow cancer. It works by blocking a protein that causes cancer cells to grow and multiply. This helps to slow or stop the spread of cancer cells. This medicine may be used for other purposes; ask your health care provider or pharmacist if you have questions. COMMON BRAND NAME(S): BORUZU, Velcade What should I tell my care team before I take this medication? They need to know if you have any of these conditions: Dehydration Diabetes Heart disease Liver disease Tingling of the fingers or toes or other nerve disorder An unusual or allergic reaction to bortezomib, other medications, foods, dyes, or preservatives If you or your partner are pregnant or trying to get pregnant Breastfeeding How should I use this medication? This medication is injected into a vein or under the skin. It is given by your care team in a hospital or clinic setting. Talk to your care team about the use of this medication in children. Special care may be needed. Overdosage: If you think you have taken too much of this medicine contact a poison control center or emergency room at once. NOTE: This medicine is only for you. Do not share this medicine with others. What if I miss a dose? Keep appointments for follow-up doses. It is important not to miss your dose. Call your care team if you are unable to keep an appointment. What may interact with this medication? Ketoconazole Rifampin This list may not describe all possible interactions. Give your health care provider a list of all the medicines, herbs, non-prescription drugs, or dietary supplements you use. Also tell them if you  smoke, drink alcohol, or use illegal drugs. Some items may interact with your medicine. What should I watch for while using this medication? Your condition will be monitored carefully while you are receiving this medication. You may need blood work while taking this medication. This medication may affect your coordination, reaction time, or judgment. Do not drive or operate machinery until you know how this medication affects you. Sit up or stand slowly to reduce the risk of dizzy or fainting spells. Drinking alcohol with this medication can increase the risk of these side effects. This medication may increase your risk of getting an infection. Call your care team for advice if you get a fever, chills, sore throat, or other symptoms of a cold or flu. Do not treat yourself. Try to avoid being around people who are sick. Check with your care team if you have severe diarrhea, nausea, and vomiting, or if you sweat a lot. The loss of too much body fluid may make it dangerous for you to take this medication. Talk to your care  team if you may be pregnant. Serious birth defects can occur if you take this medication during pregnancy and for 7 months after the last dose. You will need a negative pregnancy test before starting this medication. Contraception is recommended while taking this medication and for 7 months after the last dose. Your care team can help you find the option that works for you. If your partner can get pregnant, use a condom during sex while taking this medication and for 4 months after the last dose. Do not breastfeed while taking this medication and for 2 months after the last dose. This medication may cause infertility. Talk to your care team if you are concerned about your fertility. What side effects may I notice from receiving this medication? Side effects that you should report to your care team as soon as possible: Allergic reactions--skin rash, itching, hives, swelling of the face,  lips, tongue, or throat Bleeding--bloody or black, tar-like stools, vomiting blood or brown material that looks like coffee grounds, red or dark brown urine, small red or purple spots on skin, unusual bruising or bleeding Bleeding in the brain--severe headache, stiff neck, confusion, dizziness, change in vision, numbness or weakness of the face, arm, or leg, trouble speaking, trouble walking, vomiting Bowel blockage--stomach cramping, unable to have a bowel movement or pass gas, loss of appetite, vomiting Heart failure--shortness of breath, swelling of the ankles, feet, or hands, sudden weight gain, unusual weakness or fatigue Infection--fever, chills, cough, sore throat, wounds that don't heal, pain or trouble when passing urine, general feeling of discomfort or being unwell Liver injury--right upper belly pain, loss of appetite, nausea, light-colored stool, dark yellow or brown urine, yellowing skin or eyes, unusual weakness or fatigue Low blood pressure--dizziness, feeling faint or lightheaded, blurry vision Lung injury--shortness of breath or trouble breathing, cough, spitting up blood, chest pain, fever Pain, tingling, or numbness in the hands or feet Severe or prolonged diarrhea Stomach pain, bloody diarrhea, pale skin, unusual weakness or fatigue, decrease in the amount of urine, which may be signs of hemolytic uremic syndrome Sudden and severe headache, confusion, change in vision, seizures, which may be signs of posterior reversible encephalopathy syndrome (PRES) TTP--purple spots on the skin or inside the mouth, pale skin, yellowing skin or eyes, unusual weakness or fatigue, fever, fast or irregular heartbeat, confusion, change in vision, trouble speaking, trouble walking Tumor lysis syndrome (TLS)--nausea, vomiting, diarrhea, decrease in the amount of urine, dark urine, unusual weakness or fatigue, confusion, muscle pain or cramps, fast or irregular heartbeat, joint pain Side effects that  usually do not require medical attention (report to your care team if they continue or are bothersome): Constipation Diarrhea Fatigue Loss of appetite Nausea This list may not describe all possible side effects. Call your doctor for medical advice about side effects. You may report side effects to FDA at 1-800-FDA-1088. Where should I keep my medication? This medication is given in a hospital or clinic. It will not be stored at home. NOTE: This sheet is a summary. It may not cover all possible information. If you have questions about this medicine, talk to your doctor, pharmacist, or health care provider.  2024 Elsevier/Gold Standard (2021-09-16 00:00:00)

## 2023-06-08 NOTE — Progress Notes (Signed)
Pt took dex with Provider, Cassie, PA, during visit

## 2023-06-08 NOTE — Progress Notes (Signed)
Pt observed for 30 min post Velcade injection, tolerated well. No adverse s/s. Pt observed for 2hrs post Darzalex Faspro injection, tolerated well. No adverse s/s. VSS at time of d/c. AVS reviewed. Pt ambulated independently to lobby for d/c.

## 2023-06-09 NOTE — Telephone Encounter (Signed)
Called pt to see how she did with her recent treatment.  She reports doing well & denies any side effects.  She reports knowing how to reach [place] if needed & knows her next appts.

## 2023-06-09 NOTE — Telephone Encounter (Signed)
-----   Message from Nurse Currie Paris sent at 06/08/2023  1:06 PM EST ----- Regarding: First time Darzalex Faspro/Velcade. Pt of [place]. First time Darzalex Faspro/Velcade. Pt of [place]. Observed for 2hrs post injection, tolerated well. Please call to check in. Thanks!

## 2023-06-14 ENCOUNTER — Other Ambulatory Visit: Payer: Self-pay | Admitting: Internal Medicine

## 2023-06-14 DIAGNOSIS — C9000 Multiple myeloma not having achieved remission: Secondary | ICD-10-CM

## 2023-06-15 ENCOUNTER — Inpatient Hospital Stay: Payer: Medicare HMO

## 2023-06-15 ENCOUNTER — Encounter: Payer: Self-pay | Admitting: Internal Medicine

## 2023-06-15 ENCOUNTER — Inpatient Hospital Stay: Payer: Medicare HMO | Admitting: Physician Assistant

## 2023-06-15 ENCOUNTER — Other Ambulatory Visit: Payer: Self-pay | Admitting: Medical Oncology

## 2023-06-15 VITALS — BP 123/59 | HR 73 | Temp 98.2°F | Resp 16 | Wt 199.1 lb

## 2023-06-15 DIAGNOSIS — D649 Anemia, unspecified: Secondary | ICD-10-CM | POA: Diagnosis not present

## 2023-06-15 DIAGNOSIS — E785 Hyperlipidemia, unspecified: Secondary | ICD-10-CM | POA: Diagnosis not present

## 2023-06-15 DIAGNOSIS — C9000 Multiple myeloma not having achieved remission: Secondary | ICD-10-CM

## 2023-06-15 DIAGNOSIS — M549 Dorsalgia, unspecified: Secondary | ICD-10-CM | POA: Diagnosis not present

## 2023-06-15 DIAGNOSIS — K219 Gastro-esophageal reflux disease without esophagitis: Secondary | ICD-10-CM | POA: Diagnosis not present

## 2023-06-15 DIAGNOSIS — C9030 Solitary plasmacytoma not having achieved remission: Secondary | ICD-10-CM | POA: Diagnosis not present

## 2023-06-15 DIAGNOSIS — R768 Other specified abnormal immunological findings in serum: Secondary | ICD-10-CM | POA: Diagnosis not present

## 2023-06-15 DIAGNOSIS — Z79624 Long term (current) use of inhibitors of nucleotide synthesis: Secondary | ICD-10-CM | POA: Diagnosis not present

## 2023-06-15 DIAGNOSIS — I10 Essential (primary) hypertension: Secondary | ICD-10-CM | POA: Diagnosis not present

## 2023-06-15 DIAGNOSIS — Z5112 Encounter for antineoplastic immunotherapy: Secondary | ICD-10-CM | POA: Diagnosis not present

## 2023-06-15 LAB — CBC WITH DIFFERENTIAL (CANCER CENTER ONLY)
Abs Immature Granulocytes: 0.05 10*3/uL (ref 0.00–0.07)
Basophils Absolute: 0 10*3/uL (ref 0.0–0.1)
Basophils Relative: 1 %
Eosinophils Absolute: 0.4 10*3/uL (ref 0.0–0.5)
Eosinophils Relative: 8 %
HCT: 34.9 % — ABNORMAL LOW (ref 36.0–46.0)
Hemoglobin: 11.3 g/dL — ABNORMAL LOW (ref 12.0–15.0)
Immature Granulocytes: 1 %
Lymphocytes Relative: 16 %
Lymphs Abs: 0.8 10*3/uL (ref 0.7–4.0)
MCH: 30.4 pg (ref 26.0–34.0)
MCHC: 32.4 g/dL (ref 30.0–36.0)
MCV: 93.8 fL (ref 80.0–100.0)
Monocytes Absolute: 0.3 10*3/uL (ref 0.1–1.0)
Monocytes Relative: 6 %
Neutro Abs: 3.7 10*3/uL (ref 1.7–7.7)
Neutrophils Relative %: 68 %
Platelet Count: 173 10*3/uL (ref 150–400)
RBC: 3.72 MIL/uL — ABNORMAL LOW (ref 3.87–5.11)
RDW: 13.5 % (ref 11.5–15.5)
WBC Count: 5.4 10*3/uL (ref 4.0–10.5)
nRBC: 0 % (ref 0.0–0.2)

## 2023-06-15 LAB — CMP (CANCER CENTER ONLY)
ALT: 22 U/L (ref 0–44)
AST: 22 U/L (ref 15–41)
Albumin: 3.5 g/dL (ref 3.5–5.0)
Alkaline Phosphatase: 50 U/L (ref 38–126)
Anion gap: 5 (ref 5–15)
BUN: 14 mg/dL (ref 8–23)
CO2: 31 mmol/L (ref 22–32)
Calcium: 8.7 mg/dL — ABNORMAL LOW (ref 8.9–10.3)
Chloride: 103 mmol/L (ref 98–111)
Creatinine: 1.13 mg/dL — ABNORMAL HIGH (ref 0.44–1.00)
GFR, Estimated: 52 mL/min — ABNORMAL LOW (ref >60)
Glucose, Bld: 99 mg/dL (ref 70–99)
Potassium: 3.4 mmol/L — ABNORMAL LOW (ref 3.5–5.1)
Sodium: 139 mmol/L (ref 135–145)
Total Bilirubin: 0.7 mg/dL (ref 0.0–1.2)
Total Protein: 6.9 g/dL (ref 6.5–8.1)

## 2023-06-15 MED ORDER — ACETAMINOPHEN 325 MG PO TABS
650.0000 mg | ORAL_TABLET | Freq: Once | ORAL | Status: AC
  Administered 2023-06-15: 650 mg via ORAL
  Filled 2023-06-15: qty 2

## 2023-06-15 MED ORDER — MONTELUKAST SODIUM 10 MG PO TABS
10.0000 mg | ORAL_TABLET | Freq: Once | ORAL | Status: AC
  Administered 2023-06-15: 10 mg via ORAL
  Filled 2023-06-15: qty 1

## 2023-06-15 MED ORDER — DARATUMUMAB-HYALURONIDASE-FIHJ 1800-30000 MG-UT/15ML ~~LOC~~ SOLN
1800.0000 mg | Freq: Once | SUBCUTANEOUS | Status: AC
  Administered 2023-06-15: 1800 mg via SUBCUTANEOUS
  Filled 2023-06-15: qty 15

## 2023-06-15 MED ORDER — DIPHENHYDRAMINE HCL 25 MG PO CAPS
50.0000 mg | ORAL_CAPSULE | Freq: Once | ORAL | Status: AC
  Administered 2023-06-15: 50 mg via ORAL
  Filled 2023-06-15: qty 2

## 2023-06-15 MED ORDER — BORTEZOMIB CHEMO SQ INJECTION 3.5 MG (2.5MG/ML)
1.3000 mg/m2 | Freq: Once | INTRAMUSCULAR | Status: AC
  Administered 2023-06-15: 2.75 mg via SUBCUTANEOUS
  Filled 2023-06-15: qty 1.1

## 2023-06-15 MED ORDER — DEXAMETHASONE 4 MG PO TABS
20.0000 mg | ORAL_TABLET | Freq: Once | ORAL | Status: AC
  Administered 2023-06-15: 20 mg via ORAL
  Filled 2023-06-15: qty 5

## 2023-06-15 NOTE — Telephone Encounter (Signed)
Authorization number  [id number] date obtained 06/15/2023 called to [facility] pharmacy

## 2023-06-15 NOTE — Patient Instructions (Signed)
 [facility] - A DEPT OF [facility]  Discharge Instructions: Thank you for choosing [facility] to provide your oncology and hematology care.   If you have a lab appointment with the Cancer Center, please go directly to the Cancer Center and check in at the registration area.   Wear comfortable clothing and clothing appropriate for easy access to any Portacath or PICC line.   We strive to give you quality time with your provider. You may need to reschedule your appointment if you arrive late (15 or more minutes).  Arriving late affects you and other patients whose appointments are after yours.  Also, if you miss three or more appointments without notifying the office, you may be dismissed from the clinic at the provider's discretion.      For prescription refill requests, have your pharmacy contact our office and allow 72 hours for refills to be completed.    Today you received the following chemotherapy and/or immunotherapy agents darzalex faspro, velcade      To help prevent nausea and vomiting after your treatment, we encourage you to take your nausea medication as directed.  BELOW ARE SYMPTOMS THAT SHOULD BE REPORTED IMMEDIATELY: *FEVER GREATER THAN 100.4 F (38 C) OR HIGHER *CHILLS OR SWEATING *NAUSEA AND VOMITING THAT IS NOT CONTROLLED WITH YOUR NAUSEA MEDICATION *UNUSUAL SHORTNESS OF BREATH *UNUSUAL BRUISING OR BLEEDING *URINARY PROBLEMS (pain or burning when urinating, or frequent urination) *BOWEL PROBLEMS (unusual diarrhea, constipation, pain near the anus) TENDERNESS IN MOUTH AND THROAT WITH OR WITHOUT PRESENCE OF ULCERS (sore throat, sores in mouth, or a toothache) UNUSUAL RASH, SWELLING OR PAIN  UNUSUAL VAGINAL DISCHARGE OR ITCHING   Items with * indicate a potential emergency and should be followed up as soon as possible or go to the Emergency Department if any problems should occur.  Please show the CHEMOTHERAPY ALERT CARD or  IMMUNOTHERAPY ALERT CARD at check-in to the Emergency Department and triage nurse.  Should you have questions after your visit or need to cancel or reschedule your appointment, please contact [facility]  Dept: [phone]  and follow the prompts.  Office hours are 8:00 a.m. to 4:30 p.m. Monday - Friday. Please note that voicemails left after 4:00 p.m. may not be returned until the following business day.  We are closed weekends and major holidays. You have access to a nurse at all times for urgent questions. Please call the main number to the clinic Dept: [phone] and follow the prompts.   For any non-urgent questions, you may also contact your provider using MyChart. We now offer e-Visits for anyone 25 and older to request care online for non-urgent symptoms. For details visit [email or web address].   Also download the MyChart app! Go to the app store, search "MyChart", open the app, select [place], and log in with your MyChart username and password.

## 2023-06-19 ENCOUNTER — Other Ambulatory Visit: Payer: Self-pay

## 2023-06-22 ENCOUNTER — Other Ambulatory Visit: Payer: Self-pay | Admitting: Medical Oncology

## 2023-06-22 ENCOUNTER — Inpatient Hospital Stay: Payer: Medicare HMO | Admitting: Internal Medicine

## 2023-06-22 ENCOUNTER — Inpatient Hospital Stay: Payer: Medicare HMO

## 2023-06-22 VITALS — BP 124/67 | HR 76 | Temp 98.1°F | Resp 16 | Ht 65.0 in | Wt 201.1 lb

## 2023-06-22 DIAGNOSIS — Z79624 Long term (current) use of inhibitors of nucleotide synthesis: Secondary | ICD-10-CM | POA: Diagnosis not present

## 2023-06-22 DIAGNOSIS — Z5112 Encounter for antineoplastic immunotherapy: Secondary | ICD-10-CM | POA: Diagnosis not present

## 2023-06-22 DIAGNOSIS — M549 Dorsalgia, unspecified: Secondary | ICD-10-CM | POA: Diagnosis not present

## 2023-06-22 DIAGNOSIS — C9000 Multiple myeloma not having achieved remission: Secondary | ICD-10-CM

## 2023-06-22 DIAGNOSIS — K219 Gastro-esophageal reflux disease without esophagitis: Secondary | ICD-10-CM | POA: Diagnosis not present

## 2023-06-22 DIAGNOSIS — E785 Hyperlipidemia, unspecified: Secondary | ICD-10-CM | POA: Diagnosis not present

## 2023-06-22 DIAGNOSIS — I10 Essential (primary) hypertension: Secondary | ICD-10-CM | POA: Diagnosis not present

## 2023-06-22 DIAGNOSIS — D649 Anemia, unspecified: Secondary | ICD-10-CM | POA: Diagnosis not present

## 2023-06-22 DIAGNOSIS — R768 Other specified abnormal immunological findings in serum: Secondary | ICD-10-CM | POA: Diagnosis not present

## 2023-06-22 DIAGNOSIS — C9030 Solitary plasmacytoma not having achieved remission: Secondary | ICD-10-CM | POA: Diagnosis not present

## 2023-06-22 LAB — CBC WITH DIFFERENTIAL (CANCER CENTER ONLY)
Abs Immature Granulocytes: 0.06 10*3/uL (ref 0.00–0.07)
Basophils Absolute: 0 10*3/uL (ref 0.0–0.1)
Basophils Relative: 0 %
Eosinophils Absolute: 0.2 10*3/uL (ref 0.0–0.5)
Eosinophils Relative: 4 %
HCT: 34.2 % — ABNORMAL LOW (ref 36.0–46.0)
Hemoglobin: 11.4 g/dL — ABNORMAL LOW (ref 12.0–15.0)
Immature Granulocytes: 1 %
Lymphocytes Relative: 16 %
Lymphs Abs: 0.9 10*3/uL (ref 0.7–4.0)
MCH: 30.2 pg (ref 26.0–34.0)
MCHC: 33.3 g/dL (ref 30.0–36.0)
MCV: 90.7 fL (ref 80.0–100.0)
Monocytes Absolute: 0.7 10*3/uL (ref 0.1–1.0)
Monocytes Relative: 14 %
Neutro Abs: 3.4 10*3/uL (ref 1.7–7.7)
Neutrophils Relative %: 65 %
Platelet Count: 163 10*3/uL (ref 150–400)
RBC: 3.77 MIL/uL — ABNORMAL LOW (ref 3.87–5.11)
RDW: 13.5 % (ref 11.5–15.5)
WBC Count: 5.2 10*3/uL (ref 4.0–10.5)
nRBC: 0 % (ref 0.0–0.2)

## 2023-06-22 LAB — CMP (CANCER CENTER ONLY)
ALT: 31 U/L (ref 0–44)
AST: 25 U/L (ref 15–41)
Albumin: 3.6 g/dL (ref 3.5–5.0)
Alkaline Phosphatase: 65 U/L (ref 38–126)
Anion gap: 7 (ref 5–15)
BUN: 18 mg/dL (ref 8–23)
CO2: 27 mmol/L (ref 22–32)
Calcium: 8.4 mg/dL — ABNORMAL LOW (ref 8.9–10.3)
Chloride: 108 mmol/L (ref 98–111)
Creatinine: 1.08 mg/dL — ABNORMAL HIGH (ref 0.44–1.00)
GFR, Estimated: 55 mL/min — ABNORMAL LOW (ref >60)
Glucose, Bld: 107 mg/dL — ABNORMAL HIGH (ref 70–99)
Potassium: 3.3 mmol/L — ABNORMAL LOW (ref 3.5–5.1)
Sodium: 142 mmol/L (ref 135–145)
Total Bilirubin: 0.6 mg/dL (ref 0.0–1.2)
Total Protein: 6.6 g/dL (ref 6.5–8.1)

## 2023-06-22 MED ORDER — DIPHENHYDRAMINE HCL 25 MG PO CAPS
50.0000 mg | ORAL_CAPSULE | Freq: Once | ORAL | Status: AC
  Administered 2023-06-22: 50 mg via ORAL
  Filled 2023-06-22: qty 2

## 2023-06-22 MED ORDER — BORTEZOMIB CHEMO SQ INJECTION 3.5 MG (2.5MG/ML)
1.3000 mg/m2 | Freq: Once | INTRAMUSCULAR | Status: AC
  Administered 2023-06-22: 2.75 mg via SUBCUTANEOUS
  Filled 2023-06-22: qty 1.1

## 2023-06-22 MED ORDER — DARATUMUMAB-HYALURONIDASE-FIHJ 1800-30000 MG-UT/15ML ~~LOC~~ SOLN
1800.0000 mg | Freq: Once | SUBCUTANEOUS | Status: AC
  Administered 2023-06-22: 1800 mg via SUBCUTANEOUS
  Filled 2023-06-22: qty 15

## 2023-06-22 MED ORDER — DEXAMETHASONE 4 MG PO TABS: 20.0000 mg | ORAL_TABLET | Freq: Once | ORAL | Status: DC

## 2023-06-22 MED ORDER — ACETAMINOPHEN 325 MG PO TABS
650.0000 mg | ORAL_TABLET | Freq: Once | ORAL | Status: AC
  Administered 2023-06-22: 650 mg via ORAL
  Filled 2023-06-22: qty 2

## 2023-06-22 NOTE — Progress Notes (Signed)
 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  PRIOR THERAPY:  1) Palliative radiotherapy to the plasmacytoma of the proximal right humerus under the care of Dr. Roselind Messier. 2) Status post tumor resection from the proximal humerus with reconstruction under the care of Dr. Jana Half at [facility] on May 01, 2021.  CURRENT THERAPY: Systemic treatment with subcutaneous daratumumab, subcutaneous Velcade on weekly basis, Revlimid 25 mg p.o. for 14 days every 3 weeks in addition to Decadron 20 mg p.o. weekly during the course of chemotherapy.  First dose May 27, 2023.  Status post 1 cycle.  INTERVAL HISTORY: Joanna Ray 73 y.o. female returns to the clinic today for follow-up visit accompanied by her husband Tim.Discussed the use of AI scribe software for clinical note transcription with the patient, who gave verbal consent to proceed.  History of Present Illness   Joanna Ray is a 73 year old female with multiple myeloma who presents for follow-up after one cycle of treatment. She is accompanied by [name], her husband.  She was diagnosed with multiple myeloma in November 2024, initially presenting as a plasmacytoma with one lesion in January 2023. She is currently undergoing treatment with subcutaneous daratumumab, Velcade, Revlimid, and Decadron, having completed one cycle over the past three weeks.  She experiences a significant change in taste as a side effect of the treatment, describing it as 'terrible'. She has tried using baking soda, salt, biotin, and cough drops to alleviate this symptom, but none have been effective.  Her blood work today shows that her hemoglobin  remains low at approximately 11.4, which is consistent with previous results. However, her white blood count and platelets are normal.        MEDICAL HISTORY: Past Medical History:  Diagnosis Date   Anxiety    Arthritis    Cancer (HCC) 2021   multiple myloma Right Arm   Cataract    GERD (gastroesophageal reflux disease)    Hyperlipidemia    Hypertension    Urticaria     ALLERGIES:  is allergic to corn-containing products and shellfish allergy.  MEDICATIONS:  Current Outpatient Medications  Medication Sig Dispense Refill   acyclovir (ZOVIRAX) 400 MG tablet Take 1 tablet (400 mg total) by mouth 2 (two) times daily. 60 tablet 5   ALPRAZolam (XANAX) 0.5 MG tablet Take 1 tablet by mouth twice daily as needed for anxiety 60 tablet 5   apixaban (ELIQUIS) 2.5 MG TABS tablet Take 1 tablet (2.5 mg total) by mouth 2 (two) times daily. 60 tablet 2   aspirin 81 MG tablet Take 81 mg by mouth daily.     cholecalciferol (VITAMIN D) 1000 UNITS tablet Take 1,000 Units by mouth daily.     cyclobenzaprine (FLEXERIL) 10 MG tablet Take 1 tablet by mouth three times daily as needed for muscle spasm 90 tablet 0   dexamethasone (DECADRON) 4 MG tablet Take 5 tabs (20 mg) weekly the day after daratumumab for 12 weeks. Take with breakfast. 20 tablet 5   EPINEPHrine (EPIPEN 2-PAK) 0.3 mg/0.3 mL IJ SOAJ injection Inject 0.3 mg into the muscle as needed for anaphylaxis. 1 each PRN   fexofenadine (ALLEGRA) 180 MG tablet Take 1 tablet (180 mg total) by mouth  daily. 90 tablet 1   HYDROcodone-acetaminophen (NORCO/VICODIN) 5-325 MG tablet Take 1 tablet by mouth every 6 (six) hours as needed for moderate pain (pain score 4-6). 20 tablet 0   hydrocortisone valerate cream (WESTCORT) 0.2 % 1 app     lenalidomide (REVLIMID) 25 MG capsule TAKE 1 CAPSULE BY MOUTH DAILY FOR 14 DAYS TAKE 7 DAYS OFF AND REPEAT 14 capsule 0   meclizine (ANTIVERT) 25 MG tablet Take 1 tablet (25 mg total) by mouth 3 (three) times daily as needed  for dizziness. 30 tablet 0   ondansetron (ZOFRAN) 8 MG tablet Take 1 tablet (8 mg total) by mouth every 8 (eight) hours as needed for nausea or vomiting. 30 tablet 1   prochlorperazine (COMPAZINE) 10 MG tablet Take 1 tablet (10 mg total) by mouth every 6 (six) hours as needed for nausea or vomiting. 30 tablet 1   rosuvastatin (CRESTOR) 20 MG tablet TAKE 1/2 TABLET ONE TIME DAILY 45 tablet 3   valsartan-hydrochlorothiazide (DIOVAN-HCT) 160-25 MG tablet TAKE 1 TABLET EVERY DAY 90 tablet 3   vitamin E 100 UNIT capsule Take 100 Units by mouth daily.     No current facility-administered medications for this visit.    SURGICAL HISTORY:  Past Surgical History:  Procedure Laterality Date   CATARACT EXTRACTION, BILATERAL Bilateral    DILATION AND CURETTAGE OF UTERUS      REVIEW OF SYSTEMS:  A comprehensive review of systems was negative except for: Ears, nose, mouth, throat, and face: positive for change in taste    PHYSICAL EXAMINATION: General appearance: alert, cooperative, and no distress Head: Normocephalic, without obvious abnormality, atraumatic Neck: no adenopathy, no JVD, supple, symmetrical, trachea midline, and thyroid not enlarged, symmetric, no tenderness/mass/nodules Lymph nodes: Cervical, supraclavicular, and axillary nodes normal. Resp: clear to auscultation bilaterally Back: symmetric, no curvature. ROM normal. No CVA tenderness. Cardio: regular rate and rhythm, S1, S2 normal, no murmur, click, rub or gallop GI: soft, non-tender; bowel sounds normal; no masses,  no organomegaly Extremities: extremities normal, atraumatic, no cyanosis or edema and No edema  ECOG PERFORMANCE STATUS: 1 - Symptomatic but completely ambulatory  Blood pressure 124/67, pulse 76, temperature 98.1 F (36.7 C), temperature source Temporal, resp. rate 16, height 5\' 5"  (1.651 m), weight 201 lb 1.6 oz (91.2 kg), SpO2 100%.  LABORATORY DATA: Lab Results  Component Value Date   WBC 5.4 06/15/2023   HGB  11.3 (L) 06/15/2023   HCT 34.9 (L) 06/15/2023   MCV 93.8 06/15/2023   PLT 173 06/15/2023      Chemistry      Component Value Date/Time   NA 139 06/15/2023 1108   NA 145 (H) 12/28/2018 1436   K 3.4 (L) 06/15/2023 1108   CL 103 06/15/2023 1108   CO2 31 06/15/2023 1108   BUN 14 06/15/2023 1108   BUN 15 12/28/2018 1436   CREATININE 1.13 (H) 06/15/2023 1108   CREATININE 1.10 (H) 01/14/2023 0908      Component Value Date/Time   CALCIUM 8.7 (L) 06/15/2023 1108   ALKPHOS 50 06/15/2023 1108   AST 22 06/15/2023 1108   ALT 22 06/15/2023 1108   BILITOT 0.7 06/15/2023 1108       RADIOGRAPHIC STUDIES: No results found.   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  The patient had extensive work-up for multiple myeloma including a bone marrow biopsy and aspirate that showed only 6%  plasma cells.  Her protein studies still suspicious for underlying MGUS/multiple myeloma but the findings are not enough to call it multiple myeloma at this point. The patient underwent radiotherapy to the plasmacytoma of the proximal right humerus and tolerated the procedure fairly well.  She is currently on observation.  The skeletal bone survey showed no other lytic bone lesion except the proximal right humerus. The patient was treated with palliative radiotherapy in the past but recent skeletal bone survey showed progression of the lytic lesion in the proximal humeral diaphysis with pathologic fracture.  She underwent surgical resection of the tumor with reconstruction under the care of Dr. Jana Half at [facility] on May 01, 2021.   The patient was found on recent protein study to have elevated IgG level.  She underwent a bone marrow biopsy and aspirate that showed 10-20% plasma cells. She had skeletal bone survey performed recently that showed new lytic lesions in the proximal left  femoral shaft and midshaft of the right femur consistent with myelomatous involvement or other lytic metastasis.  There was also scattered areas of endosteal scalloping in the left humeral shaft concerning for myelomatous involvement. The patient started systemic treatment with subcutaneous daratumumab, subcutaneous Velcade, Revlimid and Decadron every 3 weeks cycle on May 27, 2023.  She status post 1 cycle and tolerated the first cycle of her treatment fairly well.    Multiple Myeloma Diagnosed in November 2024, initially presented as plasmacytoma in January 2023. Currently on subcutaneous daratumumab, Velcade, Revlimid, and Decadron. Completed one cycle with altered taste as a side effect. Blood counts: hemoglobin 11.4, WBC and platelets normal. Discussed taste alteration management. - Proceed with cycle two of treatment today - Advise oral hygiene to manage taste alteration - Schedule follow-up in three weeks  Anemia Hemoglobin at 11.3, consistent with previous levels. WBC and platelets unchanged. - Monitor hemoglobin levels during treatment.   We will also consider The patient for treatment with Zometa infusion after dental clearance. The patient was advised to call immediately if she has any concerning symptoms in the interval. The patient voices understanding of current disease status and treatment options and is in agreement with the current care plan.  All questions were answered. The patient knows to call the clinic with any problems, questions or concerns. We can certainly see the patient much sooner if necessary.  The total time spent in the appointment was 20 minutes.  Disclaimer: This note was dictated with voice recognition software. Similar sounding words can inadvertently be transcribed and may not be corrected upon review.

## 2023-06-22 NOTE — Patient Instructions (Signed)
 [facility] - A DEPT OF [facility]  Discharge Instructions: Thank you for choosing [facility] to provide your oncology and hematology care.   If you have a lab appointment with the Cancer Center, please go directly to the Cancer Center and check in at the registration area.   Wear comfortable clothing and clothing appropriate for easy access to any Portacath or PICC line.   We strive to give you quality time with your provider. You may need to reschedule your appointment if you arrive late (15 or more minutes).  Arriving late affects you and other patients whose appointments are after yours.  Also, if you miss three or more appointments without notifying the office, you may be dismissed from the clinic at the provider's discretion.      For prescription refill requests, have your pharmacy contact our office and allow 72 hours for refills to be completed.    Today you received the following chemotherapy and/or immunotherapy agents darzalex faspro, velcade      To help prevent nausea and vomiting after your treatment, we encourage you to take your nausea medication as directed.  BELOW ARE SYMPTOMS THAT SHOULD BE REPORTED IMMEDIATELY: *FEVER GREATER THAN 100.4 F (38 C) OR HIGHER *CHILLS OR SWEATING *NAUSEA AND VOMITING THAT IS NOT CONTROLLED WITH YOUR NAUSEA MEDICATION *UNUSUAL SHORTNESS OF BREATH *UNUSUAL BRUISING OR BLEEDING *URINARY PROBLEMS (pain or burning when urinating, or frequent urination) *BOWEL PROBLEMS (unusual diarrhea, constipation, pain near the anus) TENDERNESS IN MOUTH AND THROAT WITH OR WITHOUT PRESENCE OF ULCERS (sore throat, sores in mouth, or a toothache) UNUSUAL RASH, SWELLING OR PAIN  UNUSUAL VAGINAL DISCHARGE OR ITCHING   Items with * indicate a potential emergency and should be followed up as soon as possible or go to the Emergency Department if any problems should occur.  Please show the CHEMOTHERAPY ALERT CARD or  IMMUNOTHERAPY ALERT CARD at check-in to the Emergency Department and triage nurse.  Should you have questions after your visit or need to cancel or reschedule your appointment, please contact [facility]  Dept: [phone]  and follow the prompts.  Office hours are 8:00 a.m. to 4:30 p.m. Monday - Friday. Please note that voicemails left after 4:00 p.m. may not be returned until the following business day.  We are closed weekends and major holidays. You have access to a nurse at all times for urgent questions. Please call the main number to the clinic Dept: [phone] and follow the prompts.   For any non-urgent questions, you may also contact your provider using MyChart. We now offer e-Visits for anyone 25 and older to request care online for non-urgent symptoms. For details visit [email or web address].   Also download the MyChart app! Go to the app store, search "MyChart", open the app, select [place], and log in with your MyChart username and password.

## 2023-06-22 NOTE — Progress Notes (Signed)
 Pt reports took Decadron at 0740 this am.

## 2023-06-24 IMAGING — CR DG BONE SURVEY MET
8 of 10 series · 8 of 10 positions shown · non-contrast
Comparison: 07/03/2020.

CLINICAL DATA: Solitary plasmacytoma.

EXAM:
METASTATIC BONE SURVEY

[skull lat]
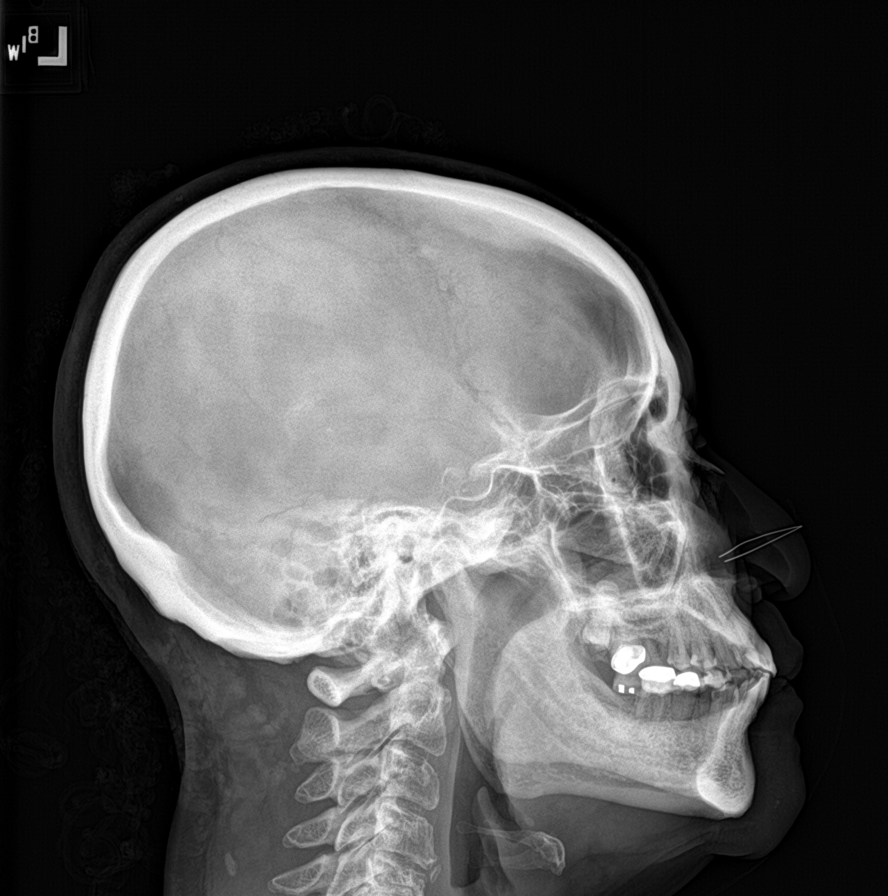

[shoulder ap (1 of 2)]
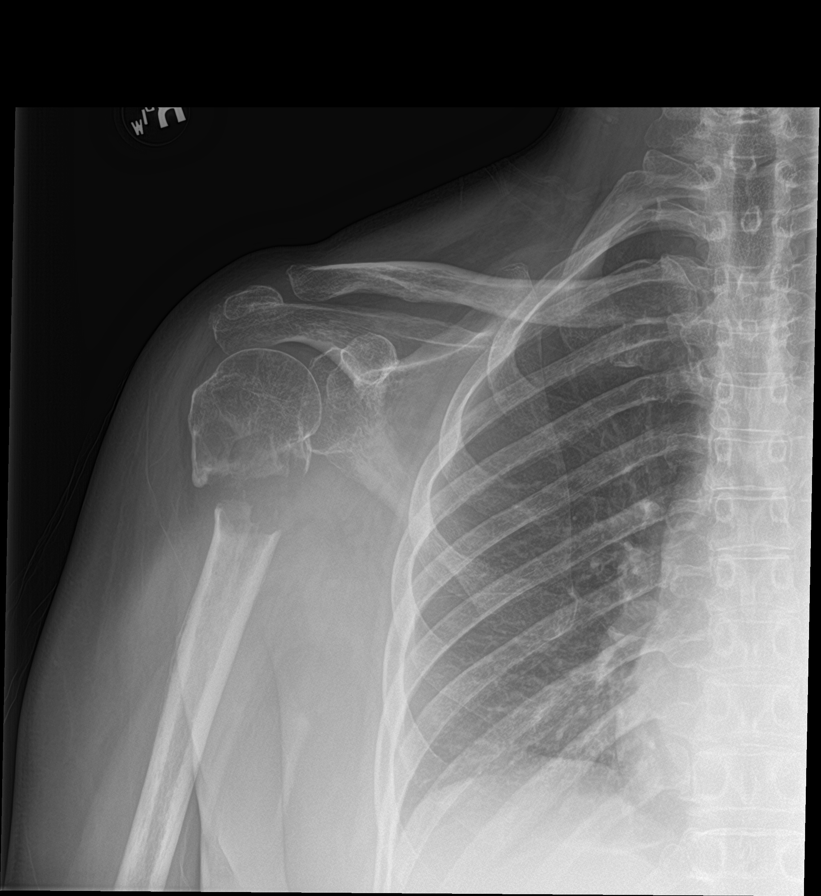

[shoulder ap (2 of 2)]
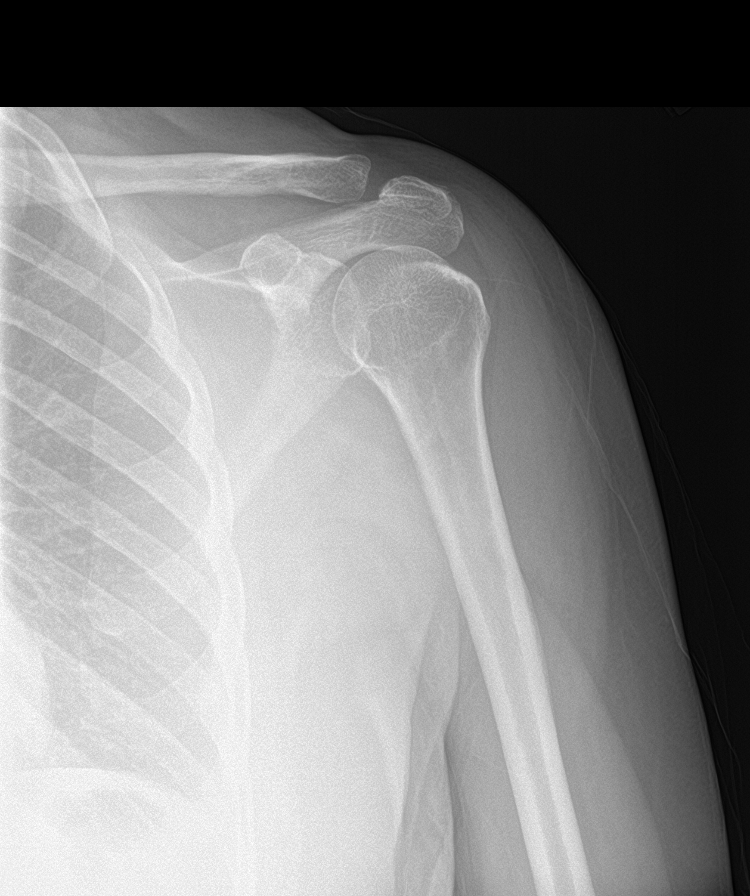

[humerus ap (1 of 2)]
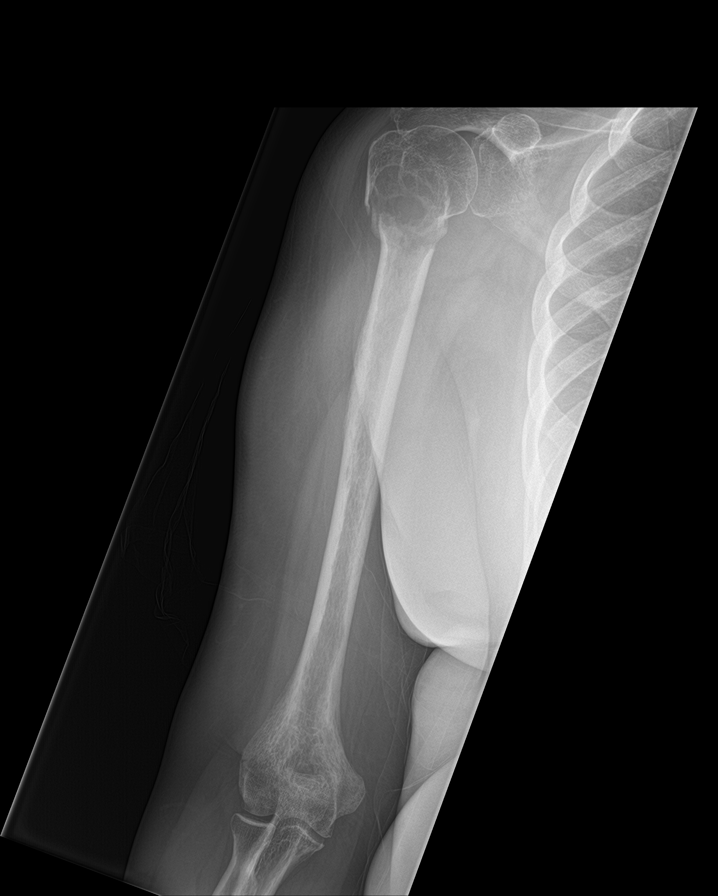

[humerus ap (2 of 2)]
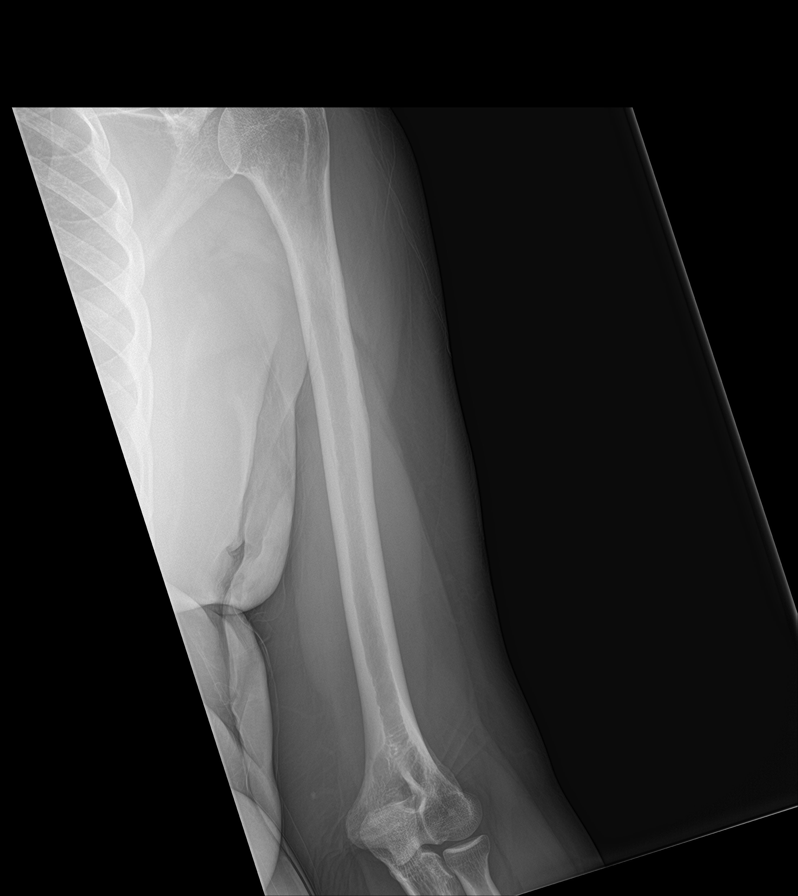

[forearm ap (1 of 2)]
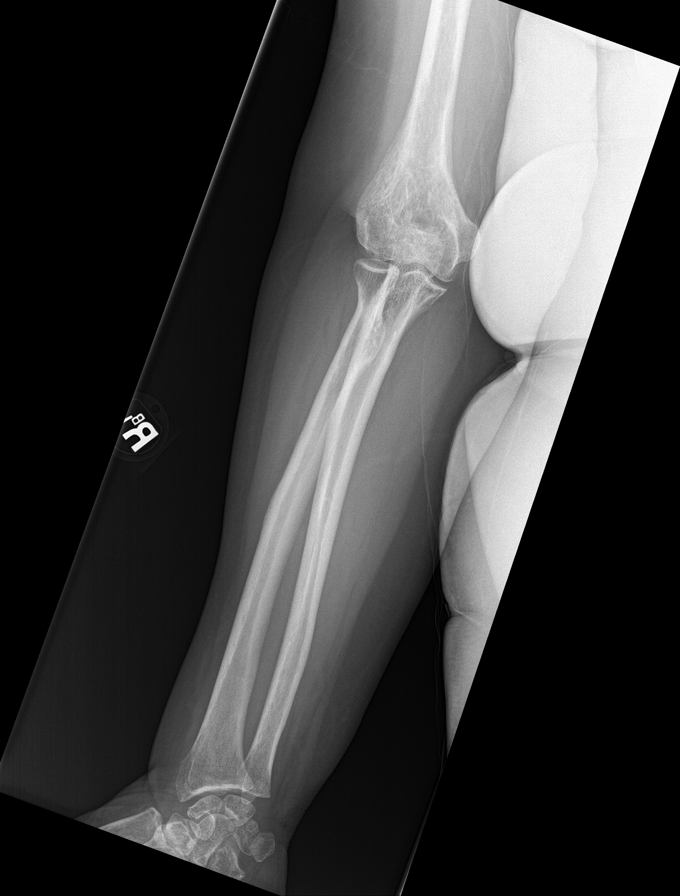

[forearm ap (2 of 2)]
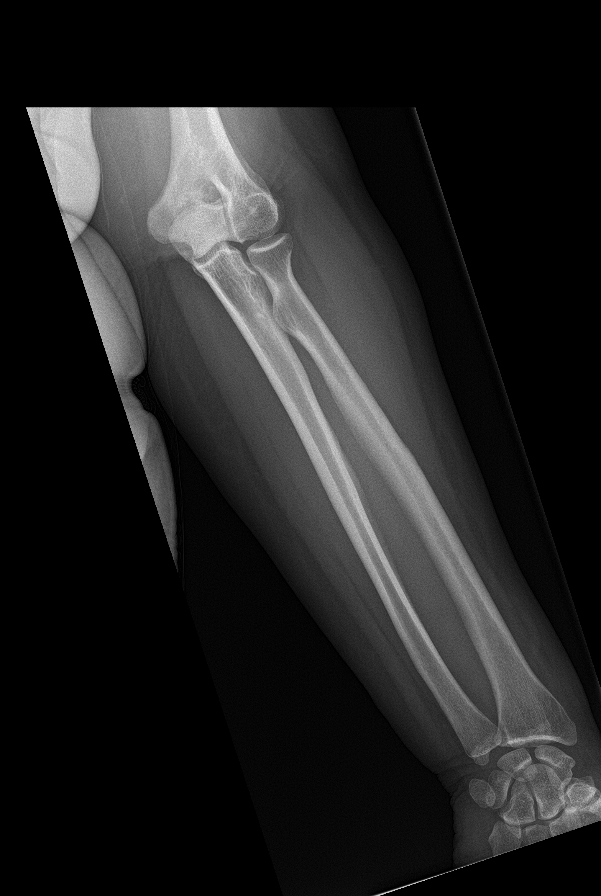

[c-spine ap]
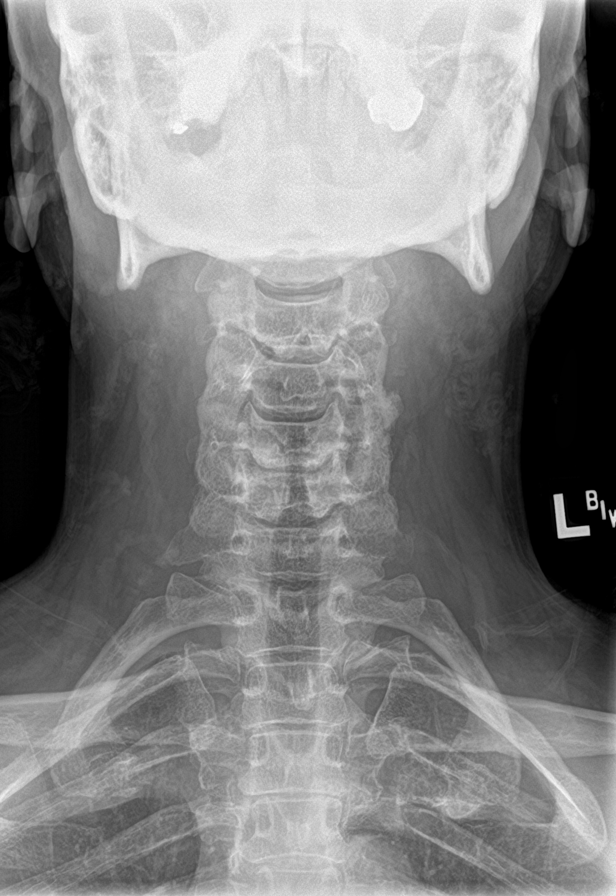

[8 of 10 positions shown; findings below may reference images not displayed]

FINDINGS: There is a lytic lesion in the proximal femoral neck/diaphysis on
the right with cortical destruction and disruption, compatible with
pathologic fracture with distraction of up to 2 cm. There is
periosteal elevation along the lateral aspect of the proximal
humeral shaft. No other lytic or destructive lesion is seen within
the bones. Degenerative changes are present in the cervical,
thoracic, and lumbar spine. No acute process is noted in the chest
or abdomen.
IMPRESSION: 1. Progression of lytic lesion in the proximal humeral diaphysis
with pathologic fracture and distraction of approximally 2 cm.
2. No other new lytic or destructive lesion in the bones.

## 2023-06-26 ENCOUNTER — Other Ambulatory Visit: Payer: Self-pay

## 2023-06-29 ENCOUNTER — Ambulatory Visit: Payer: Medicare HMO | Admitting: Internal Medicine

## 2023-06-29 ENCOUNTER — Other Ambulatory Visit: Payer: Medicare HMO

## 2023-06-29 ENCOUNTER — Inpatient Hospital Stay: Payer: Medicare HMO | Attending: Internal Medicine

## 2023-06-29 ENCOUNTER — Inpatient Hospital Stay: Payer: Medicare HMO

## 2023-06-29 VITALS — BP 156/67 | HR 63 | Temp 98.0°F | Resp 18 | Wt 199.8 lb

## 2023-06-29 DIAGNOSIS — Z7901 Long term (current) use of anticoagulants: Secondary | ICD-10-CM | POA: Diagnosis not present

## 2023-06-29 DIAGNOSIS — Z5112 Encounter for antineoplastic immunotherapy: Secondary | ICD-10-CM | POA: Diagnosis not present

## 2023-06-29 DIAGNOSIS — Z7982 Long term (current) use of aspirin: Secondary | ICD-10-CM | POA: Insufficient documentation

## 2023-06-29 DIAGNOSIS — C9000 Multiple myeloma not having achieved remission: Secondary | ICD-10-CM | POA: Insufficient documentation

## 2023-06-29 DIAGNOSIS — I10 Essential (primary) hypertension: Secondary | ICD-10-CM | POA: Insufficient documentation

## 2023-06-29 DIAGNOSIS — K219 Gastro-esophageal reflux disease without esophagitis: Secondary | ICD-10-CM | POA: Insufficient documentation

## 2023-06-29 DIAGNOSIS — L659 Nonscarring hair loss, unspecified: Secondary | ICD-10-CM | POA: Diagnosis not present

## 2023-06-29 DIAGNOSIS — Z7961 Long term (current) use of immunomodulator: Secondary | ICD-10-CM | POA: Diagnosis not present

## 2023-06-29 DIAGNOSIS — R634 Abnormal weight loss: Secondary | ICD-10-CM | POA: Insufficient documentation

## 2023-06-29 DIAGNOSIS — Z79624 Long term (current) use of inhibitors of nucleotide synthesis: Secondary | ICD-10-CM | POA: Insufficient documentation

## 2023-06-29 DIAGNOSIS — K5900 Constipation, unspecified: Secondary | ICD-10-CM | POA: Insufficient documentation

## 2023-06-29 DIAGNOSIS — E785 Hyperlipidemia, unspecified: Secondary | ICD-10-CM | POA: Diagnosis not present

## 2023-06-29 DIAGNOSIS — Z79899 Other long term (current) drug therapy: Secondary | ICD-10-CM | POA: Insufficient documentation

## 2023-06-29 LAB — CMP (CANCER CENTER ONLY)
ALT: 52 U/L — ABNORMAL HIGH (ref 0–44)
AST: 31 U/L (ref 15–41)
Albumin: 3.6 g/dL (ref 3.5–5.0)
Alkaline Phosphatase: 60 U/L (ref 38–126)
Anion gap: 5 (ref 5–15)
BUN: 13 mg/dL (ref 8–23)
CO2: 33 mmol/L — ABNORMAL HIGH (ref 22–32)
Calcium: 8.5 mg/dL — ABNORMAL LOW (ref 8.9–10.3)
Chloride: 102 mmol/L (ref 98–111)
Creatinine: 1.04 mg/dL — ABNORMAL HIGH (ref 0.44–1.00)
GFR, Estimated: 57 mL/min — ABNORMAL LOW (ref >60)
Glucose, Bld: 97 mg/dL (ref 70–99)
Potassium: 3.2 mmol/L — ABNORMAL LOW (ref 3.5–5.1)
Sodium: 140 mmol/L (ref 135–145)
Total Bilirubin: 1 mg/dL (ref 0.0–1.2)
Total Protein: 6.5 g/dL (ref 6.5–8.1)

## 2023-06-29 LAB — CBC WITH DIFFERENTIAL (CANCER CENTER ONLY)
Abs Immature Granulocytes: 0.01 10*3/uL (ref 0.00–0.07)
Basophils Absolute: 0 10*3/uL (ref 0.0–0.1)
Basophils Relative: 2 %
Eosinophils Absolute: 0.1 10*3/uL (ref 0.0–0.5)
Eosinophils Relative: 5 %
HCT: 34 % — ABNORMAL LOW (ref 36.0–46.0)
Hemoglobin: 11.2 g/dL — ABNORMAL LOW (ref 12.0–15.0)
Immature Granulocytes: 0 %
Lymphocytes Relative: 32 %
Lymphs Abs: 0.8 10*3/uL (ref 0.7–4.0)
MCH: 29.9 pg (ref 26.0–34.0)
MCHC: 32.9 g/dL (ref 30.0–36.0)
MCV: 90.9 fL (ref 80.0–100.0)
Monocytes Absolute: 0.5 10*3/uL (ref 0.1–1.0)
Monocytes Relative: 22 %
Neutro Abs: 0.9 10*3/uL — ABNORMAL LOW (ref 1.7–7.7)
Neutrophils Relative %: 39 %
Platelet Count: 144 10*3/uL — ABNORMAL LOW (ref 150–400)
RBC: 3.74 MIL/uL — ABNORMAL LOW (ref 3.87–5.11)
RDW: 13.8 % (ref 11.5–15.5)
WBC Count: 2.4 10*3/uL — ABNORMAL LOW (ref 4.0–10.5)
nRBC: 0 % (ref 0.0–0.2)

## 2023-06-29 MED ORDER — DEXAMETHASONE 4 MG PO TABS
20.0000 mg | ORAL_TABLET | Freq: Once | ORAL | Status: DC
Start: 2023-06-29 — End: 2023-06-29
  Filled 2023-06-29: qty 5

## 2023-06-29 MED ORDER — BORTEZOMIB CHEMO SQ INJECTION 3.5 MG (2.5MG/ML)
1.3000 mg/m2 | Freq: Once | INTRAMUSCULAR | Status: AC
  Administered 2023-06-29: 2.75 mg via SUBCUTANEOUS
  Filled 2023-06-29: qty 1.1

## 2023-06-29 MED ORDER — ACETAMINOPHEN 325 MG PO TABS
650.0000 mg | ORAL_TABLET | Freq: Once | ORAL | Status: AC
  Administered 2023-06-29: 650 mg via ORAL
  Filled 2023-06-29: qty 2

## 2023-06-29 MED ORDER — DARATUMUMAB-HYALURONIDASE-FIHJ 1800-30000 MG-UT/15ML ~~LOC~~ SOLN
1800.0000 mg | Freq: Once | SUBCUTANEOUS | Status: AC
  Administered 2023-06-29: 1800 mg via SUBCUTANEOUS
  Filled 2023-06-29: qty 15

## 2023-06-29 MED ORDER — DIPHENHYDRAMINE HCL 25 MG PO CAPS
50.0000 mg | ORAL_CAPSULE | Freq: Once | ORAL | Status: AC
  Administered 2023-06-29: 50 mg via ORAL
  Filled 2023-06-29: qty 2

## 2023-06-29 NOTE — Patient Instructions (Addendum)
 [facility] - A DEPT OF [facility]  Discharge Instructions: Thank you for choosing [facility] to provide your oncology and hematology care.   If you have a lab appointment with the Cancer Center, please go directly to the Cancer Center and check in at the registration area.   Wear comfortable clothing and clothing appropriate for easy access to any Portacath or PICC line.   We strive to give you quality time with your provider. You may need to reschedule your appointment if you arrive late (15 or more minutes).  Arriving late affects you and other patients whose appointments are after yours.  Also, if you miss three or more appointments without notifying the office, you may be dismissed from the clinic at the provider's discretion.      For prescription refill requests, have your pharmacy contact our office and allow 72 hours for refills to be completed.    Today you received the following chemotherapy and/or immunotherapy agents: Velcade, Dara Faspro.       To help prevent nausea and vomiting after your treatment, we encourage you to take your nausea medication as directed.  BELOW ARE SYMPTOMS THAT SHOULD BE REPORTED IMMEDIATELY: *FEVER GREATER THAN 100.4 F (38 C) OR HIGHER *CHILLS OR SWEATING *NAUSEA AND VOMITING THAT IS NOT CONTROLLED WITH YOUR NAUSEA MEDICATION *UNUSUAL SHORTNESS OF BREATH *UNUSUAL BRUISING OR BLEEDING *URINARY PROBLEMS (pain or burning when urinating, or frequent urination) *BOWEL PROBLEMS (unusual diarrhea, constipation, pain near the anus) TENDERNESS IN MOUTH AND THROAT WITH OR WITHOUT PRESENCE OF ULCERS (sore throat, sores in mouth, or a toothache) UNUSUAL RASH, SWELLING OR PAIN  UNUSUAL VAGINAL DISCHARGE OR ITCHING   Items with * indicate a potential emergency and should be followed up as soon as possible or go to the Emergency Department if any problems should occur.  Please show the CHEMOTHERAPY ALERT CARD or  IMMUNOTHERAPY ALERT CARD at check-in to the Emergency Department and triage nurse.  Should you have questions after your visit or need to cancel or reschedule your appointment, please contact [facility] - A DEPT OF [facility]  Dept: [phone]  and follow the prompts.  Office hours are 8:00 a.m. to 4:30 p.m. Monday - Friday. Please note that voicemails left after 4:00 p.m. may not be returned until the following business day.  We are closed weekends and major holidays. You have access to a nurse at all times for urgent questions. Please call the main number to the clinic Dept: [phone] and follow the prompts.   For any non-urgent questions, you may also contact your provider using MyChart. We now offer e-Visits for anyone 75 and older to request care online for non-urgent symptoms. For details visit [email or web address].   Also download the MyChart app! Go to the app store, search "MyChart", open the app, select , and log in with your MyChart username and password.

## 2023-06-29 NOTE — Progress Notes (Signed)
 Per Arbutus Ped MD, ok to proceed today with ANC 0.9

## 2023-06-30 NOTE — Progress Notes (Signed)
 [facility] OFFICE PROGRESS NOTE  [name], MD [address] 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   PRIOR THERAPY: 1) Palliative radiotherapy to the plasmacytoma of the proximal right humerus under the care of Dr. Roselind Messier. 2) Status post tumor resection from the proximal humerus with reconstruction under the care of Dr. Jana Half at [facility] on May 01, 2021.  CURRENT THERAPY: Systemic treatment with subcutaneous daratumumab, subcutaneous Velcade on weekly basis, Revlimid 25 mg p.o. for 14 days every 3 weeks in addition to Decadron 20 mg p.o. weekly during the course of chemotherapy. First dose June 08, 2023.   INTERVAL HISTORY: RUCHY WILDRICK 73 y.o. female returns to the clinic today for a follow up visit. In summary, the patient underwent radiation to a plasmacytoma of the proximal right humerus in February 2022. She underwent radiation therapy and subsequently had surgery in June 2023. Since then, the patient has been under observation. Recently, there has been a noted increase in her IgG and light chain protein levels, raising concerns of progression to multiple myeloma. A skeletal bone survey and PET scan were performed, revealing multiple areas of concern beyond the initial humerus site.   She started on 06/08/23. At her last appointment with Dr. Arbutus Ped she endorsed  taste alterations. She tried biotin, baking soda, and salter water rinses. She also uses dry mouth spray and lemon pads. Otherwise,  feels well. She denies any new symptoms today. She needs a refill of her norco which she takes for back pain secondary to the metastatic bone lesions.  She is up to date on her dental exams. Dr. Arbutus Ped was considering her for zometa once she gets dental clearance. She denies fevers,  chills, or night sweats. She did lose a few pounds due to the taste changes. She does not have any evidence of thrush. She denies any signs of symptoms of infection sore throat, nasal congestion, cough, shortness of breath, skin infections, diarrhea, or dysuria.     She is here for evaluation and repeat blood work before undergoing cycle #2 day 15.    MEDICAL HISTORY: Past Medical History:  Diagnosis Date   Anxiety    Arthritis    Cancer (HCC) 2021   multiple myloma Right Arm   Cataract    GERD (gastroesophageal reflux disease)    Hyperlipidemia    Hypertension    Urticaria     ALLERGIES:  is allergic to corn-containing products and shellfish allergy.  MEDICATIONS:  Current Outpatient Medications  Medication Sig Dispense Refill   acyclovir (ZOVIRAX) 400 MG tablet Take 1 tablet (400 mg total) by mouth 2 (two) times daily. 60 tablet 5   ALPRAZolam (XANAX) 0.5 MG tablet Take 1 tablet by mouth twice daily as needed for anxiety 60 tablet 5   apixaban (ELIQUIS) 2.5 MG TABS tablet Take 1 tablet (2.5 mg total) by mouth 2 (two) times daily. 60 tablet 2   aspirin 81 MG tablet Take 81 mg by mouth daily.     cholecalciferol (VITAMIN D) 1000 UNITS tablet Take 1,000 Units by mouth daily.     cyclobenzaprine (FLEXERIL) 10 MG tablet Take 1 tablet by mouth three times daily as needed for muscle spasm 90 tablet 0   dexamethasone (DECADRON) 4 MG tablet Take 5 tabs (20 mg) weekly the day after daratumumab for 12 weeks. Take with  breakfast. 20 tablet 5   EPINEPHrine (EPIPEN 2-PAK) 0.3 mg/0.3 mL IJ SOAJ injection Inject 0.3 mg into the muscle as needed for anaphylaxis. 1 each PRN   fexofenadine (ALLEGRA) 180 MG tablet Take 1 tablet (180 mg total) by mouth daily. 90 tablet 1   HYDROcodone-acetaminophen (NORCO/VICODIN) 5-325 MG tablet Take 1 tablet by mouth every 6 (six) hours as needed for moderate pain (pain score 4-6). 20 tablet 0   hydrocortisone valerate cream (WESTCORT) 0.2 % 1 app     lenalidomide  (REVLIMID) 25 MG capsule TAKE 1 CAPSULE BY MOUTH EVERY DAY FOR 14 DAYS, TAKE 7 DAYS OFF, REPEAT 14 capsule 0   meclizine (ANTIVERT) 25 MG tablet Take 1 tablet (25 mg total) by mouth 3 (three) times daily as needed for dizziness. 30 tablet 0   ondansetron (ZOFRAN) 8 MG tablet Take 1 tablet (8 mg total) by mouth every 8 (eight) hours as needed for nausea or vomiting. 30 tablet 1   prochlorperazine (COMPAZINE) 10 MG tablet Take 1 tablet (10 mg total) by mouth every 6 (six) hours as needed for nausea or vomiting. 30 tablet 1   rosuvastatin (CRESTOR) 20 MG tablet TAKE 1/2 TABLET ONE TIME DAILY 45 tablet 3   valsartan-hydrochlorothiazide (DIOVAN-HCT) 160-25 MG tablet TAKE 1 TABLET EVERY DAY 90 tablet 3   vitamin E 100 UNIT capsule Take 100 Units by mouth daily.     No current facility-administered medications for this visit.   Facility-Administered Medications Ordered in Other Visits  Medication Dose Route Frequency Provider Last Rate Last Admin   bortezomib SQ (VELCADE) chemo injection (2.5mg /mL concentration) 2.75 mg  1.3 mg/m2 (Treatment Plan Recorded) Subcutaneous Once Si Gaul, MD       daratumumab-hyaluronidase-fihj [facility] FASPRO) 1800-30000 MG-UT/15ML chemo SQ injection 1,800 mg  1,800 mg Subcutaneous Once Si Gaul, MD        SURGICAL HISTORY:  Past Surgical History:  Procedure Laterality Date   CATARACT EXTRACTION, BILATERAL Bilateral    DILATION AND CURETTAGE OF UTERUS      REVIEW OF SYSTEMS:   Review of Systems  Constitutional: Stable fatigue. Positive for weight loss. Negative for appetite change, chills, fatigue, and fever.  HENT: Positive for taste alterations. Negative for mouth sores, nosebleeds, sore throat and trouble swallowing.   Eyes: Negative for eye problems and icterus.  Respiratory: Negative for cough, hemoptysis, shortness of breath and wheezing.   Cardiovascular: Negative for chest pain and leg swelling.  Gastrointestinal: Negative for abdominal  pain, constipation, diarrhea, nausea and vomiting.  Genitourinary: Negative for bladder incontinence, difficulty urinating, dysuria, frequency and hematuria.   Musculoskeletal: Positive for stable back pain. Negative for gait problem, neck pain and neck stiffness.  Skin: Negative for itching and rash.  Neurological: Negative for dizziness, extremity weakness, gait problem, headaches, light-headedness and seizures.  Hematological: Negative for adenopathy. Does not bruise/bleed easily.  Psychiatric/Behavioral: Negative for confusion, depression and sleep disturbance. The patient is not nervous/anxious.     PHYSICAL EXAMINATION:  Blood pressure (!) 140/66, pulse 72, temperature (!) 97.5 F (36.4 C), temperature source Temporal, resp. rate 18, weight 196 lb (88.9 kg), SpO2 99%.  ECOG PERFORMANCE STATUS: 1  Physical Exam  Constitutional: Oriented to person, place, and time and well-developed, well-nourished, and in no distress.  HENT:  Head: Normocephalic and atraumatic.  Mouth/Throat: Oropharynx is clear and moist. No oropharyngeal exudate.  Eyes: Conjunctivae are normal. Right eye exhibits no discharge. Left eye exhibits no discharge. No scleral icterus.  Neck: Normal range of motion. Neck  supple.  Cardiovascular: Normal rate, regular rhythm, normal heart sounds and intact distal pulses.   Pulmonary/Chest: Effort normal and breath sounds normal. No respiratory distress. No wheezes. No rales.  Abdominal: Soft. Bowel sounds are normal. Exhibits no distension and no mass. There is no tenderness.  Musculoskeletal: Normal range of motion. Exhibits no edema.  Lymphadenopathy:    No cervical adenopathy.  Neurological: Alert and oriented to person, place, and time. Exhibits normal muscle tone. Gait normal. Coordination normal.  Skin: Skin is warm and dry. No rash noted. Not diaphoretic. No erythema. No pallor.  Psychiatric: Mood, memory and judgment normal.  Vitals reviewed.  LABORATORY  DATA: Lab Results  Component Value Date   WBC 3.1 (L) 07/06/2023   HGB 11.1 (L) 07/06/2023   HCT 33.3 (L) 07/06/2023   MCV 90.0 07/06/2023   PLT 148 (L) 07/06/2023      Chemistry      Component Value Date/Time   NA 140 06/29/2023 0908   NA 145 (H) 12/28/2018 1436   K 3.2 (L) 06/29/2023 0908   CL 102 06/29/2023 0908   CO2 33 (H) 06/29/2023 0908   BUN 13 06/29/2023 0908   BUN 15 12/28/2018 1436   CREATININE 1.04 (H) 06/29/2023 0908   CREATININE 1.10 (H) 01/14/2023 0908      Component Value Date/Time   CALCIUM 8.5 (L) 06/29/2023 0908   ALKPHOS 60 06/29/2023 0908   AST 31 06/29/2023 0908   ALT 52 (H) 06/29/2023 0908   BILITOT 1.0 06/29/2023 0908       RADIOGRAPHIC STUDIES:  No results found.   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  The patient had extensive work-up for multiple myeloma including a bone marrow biopsy and aspirate that showed only 6% plasma cells.  Her protein studies were still suspicious for underlying MGUS/multiple myeloma but the findings are not enough to call it multiple myeloma at this point.   The patient underwent radiotherapy to the plasmacytoma of the proximal right humerus. The skeletal bone survey at that time showed no other lytic bone lesion except the proximal right humerus.   Recent skeletal bone survey showed progression of the lytic lesion in the proximal humeral diaphysis with pathologic fracture.  She underwent surgical resection of the tumor with reconstruction under the care of Dr. Jana Half at [facility] on May 01, 2021.    The patient was found on recent protein study to have elevated IgG level.  She underwent a bone marrow biopsy and aspirate that showed 10-20% plasma cells. She had skeletal bone survey performed recently that showed new lytic lesions in the proximal left femoral shaft and midshaft of the  right femur consistent with myelomatous involvement or other lytic metastasis.  There was also scattered areas of endosteal scalloping in the left humeral shaft concerning for myelomatous involvement. This was diagnosed in November 2024.   Therefore, Dr. Arbutus Ped recommended treatemnt with revlimid 25 mg 14 days on/7 days off, daratumumab, and velcade subcutaneously weekly with decadron 20 mg weekly. The plan is to undergo 4 rounds then assessing for potential for if she is a candidate for autologous stem cell transplant   Labs were reviewed. She did not have a CMP performed today. I checked with Dr. Arbutus Ped. She has had weekly CMPs and her most recent was last week. She will proceed with treatment today as scheduled. Recommend she proceed with cycle #a day  15 today as scheduled.   We will see her back for a follow up visit in 2-3 weeks with treatment.    For back pain, she will continue norco, I have sent a refill.   We talked about taste alterations. She will continue biotene, lemon pads, salt water rinses, and baking soda rinses.   We will also consider The patient for treatment with Zometa infusion after dental clearance. I wrote this medication and our fax number out for her today and she will call her dentist for them to fax clearance to [place].   I added standing orders for CMP  The patient was advised to call immediately if she has any concerning symptoms in the interval. The patient voices understanding of current disease status and treatment options and is in agreement with the current care plan. All questions were answered. The patient knows to call the clinic with any problems, questions or concerns. We can certainly see the patient much sooner if necessary   Orders Placed This Encounter  Procedures   CMP (Cancer Center only)    Standing Status:   Future    Expected Date:   07/06/2023    Expiration Date:   07/05/2024     The total time spent in the appointment was 20-29  minutes  Everline Mahaffy L Eldene Plocher, PA-C 07/06/23

## 2023-07-04 ENCOUNTER — Other Ambulatory Visit: Payer: Self-pay | Admitting: Internal Medicine

## 2023-07-04 DIAGNOSIS — C9000 Multiple myeloma not having achieved remission: Secondary | ICD-10-CM

## 2023-07-05 ENCOUNTER — Encounter: Payer: Self-pay | Admitting: Internal Medicine

## 2023-07-06 ENCOUNTER — Inpatient Hospital Stay: Payer: Medicare HMO

## 2023-07-06 ENCOUNTER — Inpatient Hospital Stay: Payer: Medicare HMO | Admitting: Physician Assistant

## 2023-07-06 VITALS — BP 140/66 | HR 72 | Temp 97.5°F | Resp 18 | Wt 196.0 lb

## 2023-07-06 DIAGNOSIS — I10 Essential (primary) hypertension: Secondary | ICD-10-CM | POA: Diagnosis not present

## 2023-07-06 DIAGNOSIS — R634 Abnormal weight loss: Secondary | ICD-10-CM | POA: Diagnosis not present

## 2023-07-06 DIAGNOSIS — L659 Nonscarring hair loss, unspecified: Secondary | ICD-10-CM | POA: Diagnosis not present

## 2023-07-06 DIAGNOSIS — Z5111 Encounter for antineoplastic chemotherapy: Secondary | ICD-10-CM | POA: Insufficient documentation

## 2023-07-06 DIAGNOSIS — C9000 Multiple myeloma not having achieved remission: Secondary | ICD-10-CM

## 2023-07-06 DIAGNOSIS — Z5112 Encounter for antineoplastic immunotherapy: Secondary | ICD-10-CM | POA: Diagnosis not present

## 2023-07-06 DIAGNOSIS — K219 Gastro-esophageal reflux disease without esophagitis: Secondary | ICD-10-CM | POA: Diagnosis not present

## 2023-07-06 DIAGNOSIS — Z7961 Long term (current) use of immunomodulator: Secondary | ICD-10-CM | POA: Diagnosis not present

## 2023-07-06 DIAGNOSIS — E785 Hyperlipidemia, unspecified: Secondary | ICD-10-CM | POA: Diagnosis not present

## 2023-07-06 DIAGNOSIS — K5900 Constipation, unspecified: Secondary | ICD-10-CM | POA: Diagnosis not present

## 2023-07-06 LAB — CBC WITH DIFFERENTIAL (CANCER CENTER ONLY)
Abs Immature Granulocytes: 0.25 10*3/uL — ABNORMAL HIGH (ref 0.00–0.07)
Basophils Absolute: 0.1 10*3/uL (ref 0.0–0.1)
Basophils Relative: 2 %
Eosinophils Absolute: 0.1 10*3/uL (ref 0.0–0.5)
Eosinophils Relative: 4 %
HCT: 33.3 % — ABNORMAL LOW (ref 36.0–46.0)
Hemoglobin: 11.1 g/dL — ABNORMAL LOW (ref 12.0–15.0)
Immature Granulocytes: 8 %
Lymphocytes Relative: 19 %
Lymphs Abs: 0.6 10*3/uL — ABNORMAL LOW (ref 0.7–4.0)
MCH: 30 pg (ref 26.0–34.0)
MCHC: 33.3 g/dL (ref 30.0–36.0)
MCV: 90 fL (ref 80.0–100.0)
Monocytes Absolute: 0.5 10*3/uL (ref 0.1–1.0)
Monocytes Relative: 16 %
Neutro Abs: 1.6 10*3/uL — ABNORMAL LOW (ref 1.7–7.7)
Neutrophils Relative %: 51 %
Platelet Count: 148 10*3/uL — ABNORMAL LOW (ref 150–400)
RBC: 3.7 MIL/uL — ABNORMAL LOW (ref 3.87–5.11)
RDW: 14 % (ref 11.5–15.5)
WBC Count: 3.1 10*3/uL — ABNORMAL LOW (ref 4.0–10.5)
nRBC: 0 % (ref 0.0–0.2)

## 2023-07-06 MED ORDER — DEXAMETHASONE 4 MG PO TABS
20.0000 mg | ORAL_TABLET | Freq: Once | ORAL | Status: AC
Start: 2023-07-06 — End: 2023-07-06
  Administered 2023-07-06: 20 mg via ORAL
  Filled 2023-07-06: qty 5

## 2023-07-06 MED ORDER — DIPHENHYDRAMINE HCL 25 MG PO CAPS
50.0000 mg | ORAL_CAPSULE | Freq: Once | ORAL | Status: AC
  Administered 2023-07-06: 50 mg via ORAL
  Filled 2023-07-06: qty 2

## 2023-07-06 MED ORDER — DARATUMUMAB-HYALURONIDASE-FIHJ 1800-30000 MG-UT/15ML ~~LOC~~ SOLN
1800.0000 mg | Freq: Once | SUBCUTANEOUS | Status: AC
  Administered 2023-07-06: 1800 mg via SUBCUTANEOUS
  Filled 2023-07-06: qty 15

## 2023-07-06 MED ORDER — BORTEZOMIB CHEMO SQ INJECTION 3.5 MG (2.5MG/ML)
1.3000 mg/m2 | Freq: Once | INTRAMUSCULAR | Status: AC
  Administered 2023-07-06: 2.75 mg via SUBCUTANEOUS
  Filled 2023-07-06: qty 1.1

## 2023-07-06 MED ORDER — HYDROCODONE-ACETAMINOPHEN 5-325 MG PO TABS
1.0000 | ORAL_TABLET | Freq: Four times a day (QID) | ORAL | 0 refills | Status: DC | PRN
Start: 2023-07-06 — End: 2023-08-24

## 2023-07-06 MED ORDER — ACETAMINOPHEN 325 MG PO TABS
650.0000 mg | ORAL_TABLET | Freq: Once | ORAL | Status: AC
  Administered 2023-07-06: 650 mg via ORAL
  Filled 2023-07-06: qty 2

## 2023-07-06 NOTE — Patient Instructions (Signed)
 [facility]  Discharge Instructions: Thank you for choosing [facility] to provide your oncology and hematology care.   If you have a lab appointment with the Cancer Center, please go directly to the Cancer Center and check in at the registration area.   Wear comfortable clothing and clothing appropriate for easy access to any Portacath or PICC line.   We strive to give you quality time with your provider. You may need to reschedule your appointment if you arrive late (15 or more minutes).  Arriving late affects you and other patients whose appointments are after yours.  Also, if you miss three or more appointments without notifying the office, you may be dismissed from the clinic at the provider's discretion.      For prescription refill requests, have your pharmacy contact our office and allow 72 hours for refills to be completed.    Today you received the following chemotherapy and/or immunotherapy agents Velcade, Darzalex Faspro.    To help prevent nausea and vomiting after your treatment, we encourage you to take your nausea medication as directed.  BELOW ARE SYMPTOMS THAT SHOULD BE REPORTED IMMEDIATELY: *FEVER GREATER THAN 100.4 F (38 C) OR HIGHER *CHILLS OR SWEATING *NAUSEA AND VOMITING THAT IS NOT CONTROLLED WITH YOUR NAUSEA MEDICATION *UNUSUAL SHORTNESS OF BREATH *UNUSUAL BRUISING OR BLEEDING *URINARY PROBLEMS (pain or burning when urinating, or frequent urination) *BOWEL PROBLEMS (unusual diarrhea, constipation, pain near the anus) TENDERNESS IN MOUTH AND THROAT WITH OR WITHOUT PRESENCE OF ULCERS (sore throat, sores in mouth, or a toothache) UNUSUAL RASH, SWELLING OR PAIN  UNUSUAL VAGINAL DISCHARGE OR ITCHING   Items with * indicate a potential emergency and should be followed up as soon as possible or go to the Emergency Department if any problems should occur.  Please show the CHEMOTHERAPY ALERT CARD or  IMMUNOTHERAPY ALERT CARD at check-in to the Emergency Department and triage nurse.  Should you have questions after your visit or need to cancel or reschedule your appointment, please contact [facility]  Dept: [phone]  and follow the prompts.  Office hours are 8:00 a.m. to 4:30 p.m. Monday - Friday. Please note that voicemails left after 4:00 p.m. may not be returned until the following business day.  We are closed weekends and major holidays. You have access to a nurse at all times for urgent questions. Please call the main number to the clinic Dept: [phone] and follow the prompts.   For any non-urgent questions, you may also contact your provider using MyChart. We now offer e-Visits for anyone 86 and older to request care online for non-urgent symptoms. For details visit [email or web address].   Also download the MyChart app! Go to the app store, search "MyChart", open the app, select [place], and log in with your MyChart username and password.

## 2023-07-13 ENCOUNTER — Other Ambulatory Visit: Payer: Medicare HMO

## 2023-07-13 ENCOUNTER — Ambulatory Visit: Payer: Medicare HMO | Admitting: Physician Assistant

## 2023-07-13 ENCOUNTER — Inpatient Hospital Stay: Payer: Medicare HMO

## 2023-07-13 ENCOUNTER — Other Ambulatory Visit: Payer: Self-pay | Admitting: *Deleted

## 2023-07-13 VITALS — BP 140/56 | HR 58 | Temp 98.1°F | Resp 16 | Wt 194.5 lb

## 2023-07-13 DIAGNOSIS — Z7961 Long term (current) use of immunomodulator: Secondary | ICD-10-CM | POA: Diagnosis not present

## 2023-07-13 DIAGNOSIS — C9000 Multiple myeloma not having achieved remission: Secondary | ICD-10-CM

## 2023-07-13 DIAGNOSIS — I10 Essential (primary) hypertension: Secondary | ICD-10-CM | POA: Diagnosis not present

## 2023-07-13 DIAGNOSIS — K5900 Constipation, unspecified: Secondary | ICD-10-CM | POA: Diagnosis not present

## 2023-07-13 DIAGNOSIS — Z5112 Encounter for antineoplastic immunotherapy: Secondary | ICD-10-CM | POA: Diagnosis not present

## 2023-07-13 DIAGNOSIS — K219 Gastro-esophageal reflux disease without esophagitis: Secondary | ICD-10-CM | POA: Diagnosis not present

## 2023-07-13 DIAGNOSIS — E785 Hyperlipidemia, unspecified: Secondary | ICD-10-CM | POA: Diagnosis not present

## 2023-07-13 DIAGNOSIS — L659 Nonscarring hair loss, unspecified: Secondary | ICD-10-CM | POA: Diagnosis not present

## 2023-07-13 DIAGNOSIS — R634 Abnormal weight loss: Secondary | ICD-10-CM | POA: Diagnosis not present

## 2023-07-13 LAB — CMP (CANCER CENTER ONLY)
ALT: 45 U/L — ABNORMAL HIGH (ref 0–44)
AST: 37 U/L (ref 15–41)
Albumin: 3.7 g/dL (ref 3.5–5.0)
Alkaline Phosphatase: 63 U/L (ref 38–126)
Anion gap: 5 (ref 5–15)
BUN: 21 mg/dL (ref 8–23)
CO2: 33 mmol/L — ABNORMAL HIGH (ref 22–32)
Calcium: 8.8 mg/dL — ABNORMAL LOW (ref 8.9–10.3)
Chloride: 103 mmol/L (ref 98–111)
Creatinine: 1.15 mg/dL — ABNORMAL HIGH (ref 0.44–1.00)
GFR, Estimated: 51 mL/min — ABNORMAL LOW (ref >60)
Glucose, Bld: 102 mg/dL — ABNORMAL HIGH (ref 70–99)
Potassium: 4 mmol/L (ref 3.5–5.1)
Sodium: 141 mmol/L (ref 135–145)
Total Bilirubin: 0.8 mg/dL (ref 0.0–1.2)
Total Protein: 6.2 g/dL — ABNORMAL LOW (ref 6.5–8.1)

## 2023-07-13 LAB — CBC WITH DIFFERENTIAL (CANCER CENTER ONLY)
Abs Immature Granulocytes: 0.16 10*3/uL — ABNORMAL HIGH (ref 0.00–0.07)
Basophils Absolute: 0.1 10*3/uL (ref 0.0–0.1)
Basophils Relative: 2 %
Eosinophils Absolute: 0.1 10*3/uL (ref 0.0–0.5)
Eosinophils Relative: 3 %
HCT: 34.5 % — ABNORMAL LOW (ref 36.0–46.0)
Hemoglobin: 11.5 g/dL — ABNORMAL LOW (ref 12.0–15.0)
Immature Granulocytes: 4 %
Lymphocytes Relative: 22 %
Lymphs Abs: 0.8 10*3/uL (ref 0.7–4.0)
MCH: 30.3 pg (ref 26.0–34.0)
MCHC: 33.3 g/dL (ref 30.0–36.0)
MCV: 90.8 fL (ref 80.0–100.0)
Monocytes Absolute: 0.5 10*3/uL (ref 0.1–1.0)
Monocytes Relative: 13 %
Neutro Abs: 2 10*3/uL (ref 1.7–7.7)
Neutrophils Relative %: 56 %
Platelet Count: 192 10*3/uL (ref 150–400)
RBC: 3.8 MIL/uL — ABNORMAL LOW (ref 3.87–5.11)
RDW: 14.3 % (ref 11.5–15.5)
WBC Count: 3.6 10*3/uL — ABNORMAL LOW (ref 4.0–10.5)
nRBC: 0 % (ref 0.0–0.2)

## 2023-07-13 MED ORDER — ACETAMINOPHEN 325 MG PO TABS
650.0000 mg | ORAL_TABLET | Freq: Once | ORAL | Status: AC
  Administered 2023-07-13: 650 mg via ORAL
  Filled 2023-07-13: qty 2

## 2023-07-13 MED ORDER — BORTEZOMIB CHEMO SQ INJECTION 3.5 MG (2.5MG/ML)
1.3000 mg/m2 | Freq: Once | INTRAMUSCULAR | Status: AC
  Administered 2023-07-13: 2.75 mg via SUBCUTANEOUS
  Filled 2023-07-13: qty 1.1

## 2023-07-13 MED ORDER — LENALIDOMIDE 25 MG PO CAPS: 25.0000 mg | ORAL_CAPSULE | Freq: Every day | ORAL | 0 refills | Status: DC

## 2023-07-13 MED ORDER — DEXAMETHASONE 4 MG PO TABS
20.0000 mg | ORAL_TABLET | Freq: Once | ORAL | Status: AC
  Administered 2023-07-13: 20 mg via ORAL
  Filled 2023-07-13: qty 5

## 2023-07-13 MED ORDER — DARATUMUMAB-HYALURONIDASE-FIHJ 1800-30000 MG-UT/15ML ~~LOC~~ SOLN
1800.0000 mg | Freq: Once | SUBCUTANEOUS | Status: AC
  Administered 2023-07-13: 1800 mg via SUBCUTANEOUS
  Filled 2023-07-13: qty 15

## 2023-07-13 MED ORDER — DIPHENHYDRAMINE HCL 25 MG PO CAPS
50.0000 mg | ORAL_CAPSULE | Freq: Once | ORAL | Status: AC
  Administered 2023-07-13: 50 mg via ORAL
  Filled 2023-07-13: qty 2

## 2023-07-13 NOTE — Patient Instructions (Signed)
 [facility]  Discharge Instructions: Thank you for choosing [facility] to provide your oncology and hematology care.   If you have a lab appointment with the Cancer Center, please go directly to the Cancer Center and check in at the registration area.   Wear comfortable clothing and clothing appropriate for easy access to any Portacath or PICC line.   We strive to give you quality time with your provider. You may need to reschedule your appointment if you arrive late (15 or more minutes).  Arriving late affects you and other patients whose appointments are after yours.  Also, if you miss three or more appointments without notifying the office, you may be dismissed from the clinic at the provider's discretion.      For prescription refill requests, have your pharmacy contact our office and allow 72 hours for refills to be completed.    Today you received the following chemotherapy and/or immunotherapy agents: Velcade, Dara Faspro.       To help prevent nausea and vomiting after your treatment, we encourage you to take your nausea medication as directed.  BELOW ARE SYMPTOMS THAT SHOULD BE REPORTED IMMEDIATELY: *FEVER GREATER THAN 100.4 F (38 C) OR HIGHER *CHILLS OR SWEATING *NAUSEA AND VOMITING THAT IS NOT CONTROLLED WITH YOUR NAUSEA MEDICATION *UNUSUAL SHORTNESS OF BREATH *UNUSUAL BRUISING OR BLEEDING *URINARY PROBLEMS (pain or burning when urinating, or frequent urination) *BOWEL PROBLEMS (unusual diarrhea, constipation, pain near the anus) TENDERNESS IN MOUTH AND THROAT WITH OR WITHOUT PRESENCE OF ULCERS (sore throat, sores in mouth, or a toothache) UNUSUAL RASH, SWELLING OR PAIN  UNUSUAL VAGINAL DISCHARGE OR ITCHING   Items with * indicate a potential emergency and should be followed up as soon as possible or go to the Emergency Department if any problems should occur.  Please show the CHEMOTHERAPY ALERT CARD or  IMMUNOTHERAPY ALERT CARD at check-in to the Emergency Department and triage nurse.  Should you have questions after your visit or need to cancel or reschedule your appointment, please contact [facility]  Dept: [phone]  and follow the prompts.  Office hours are 8:00 a.m. to 4:30 p.m. Monday - Friday. Please note that voicemails left after 4:00 p.m. may not be returned until the following business day.  We are closed weekends and major holidays. You have access to a nurse at all times for urgent questions. Please call the main number to the clinic Dept: [phone] and follow the prompts.   For any non-urgent questions, you may also contact your provider using MyChart. We now offer e-Visits for anyone 75 and older to request care online for non-urgent symptoms. For details visit [email or web address].   Also download the MyChart app! Go to the app store, search "MyChart", open the app, select , and log in with your MyChart username and password.

## 2023-07-13 NOTE — Telephone Encounter (Signed)
 Patient stated concern r/t lenalidomide RX to infusion RN - message sent to MD desk nurse.  Obtained [employer] auth# for Revlimid refill. RX resent to [facility] with auth #.  Called [facility] to confirm receipt of RX with auth number. Lyda Kalata Tech confirmed new auth # received. Zacarias Pontes states she will send RX through and contact patient today to schedule delivery.   Gave message to [facility] infusion RN Austin Miles, to share with patient.

## 2023-07-15 ENCOUNTER — Other Ambulatory Visit: Payer: Medicare HMO

## 2023-07-15 DIAGNOSIS — E7800 Pure hypercholesterolemia, unspecified: Secondary | ICD-10-CM

## 2023-07-15 DIAGNOSIS — I10 Essential (primary) hypertension: Secondary | ICD-10-CM

## 2023-07-15 NOTE — Progress Notes (Signed)
 Lab only

## 2023-07-16 ENCOUNTER — Encounter: Payer: Self-pay | Admitting: Internal Medicine

## 2023-07-16 LAB — BASIC METABOLIC PANEL
BUN/Creatinine Ratio: 24 (calc) — ABNORMAL HIGH (ref 6–22)
BUN: 44 mg/dL — ABNORMAL HIGH (ref 7–25)
CO2: 26 mmol/L (ref 20–32)
Calcium: 9.4 mg/dL (ref 8.6–10.4)
Chloride: 106 mmol/L (ref 98–110)
Creat: 1.84 mg/dL — ABNORMAL HIGH (ref 0.60–1.00)
Glucose, Bld: 132 mg/dL — ABNORMAL HIGH (ref 65–99)
Potassium: 3.8 mmol/L (ref 3.5–5.3)
Sodium: 143 mmol/L (ref 135–146)
eGFR: 29 mL/min/{1.73_m2} — ABNORMAL LOW (ref >=60)

## 2023-07-16 LAB — LIPID PANEL
Cholesterol: 187 mg/dL (ref <200)
HDL: 47 mg/dL — ABNORMAL LOW (ref >=50)
LDL Cholesterol (Calc): 109 mg/dL — ABNORMAL HIGH
Non-HDL Cholesterol (Calc): 140 mg/dL — ABNORMAL HIGH (ref <130)
Total CHOL/HDL Ratio: 4 (calc) (ref <5.0)
Triglycerides: 184 mg/dL — ABNORMAL HIGH (ref <150)

## 2023-07-19 ENCOUNTER — Other Ambulatory Visit

## 2023-07-19 NOTE — Progress Notes (Deleted)
 b

## 2023-07-20 ENCOUNTER — Inpatient Hospital Stay: Payer: Medicare HMO

## 2023-07-20 ENCOUNTER — Other Ambulatory Visit: Payer: Self-pay | Admitting: Physician Assistant

## 2023-07-20 ENCOUNTER — Encounter: Payer: Self-pay | Admitting: Internal Medicine

## 2023-07-20 ENCOUNTER — Inpatient Hospital Stay (HOSPITAL_BASED_OUTPATIENT_CLINIC_OR_DEPARTMENT_OTHER): Payer: Medicare HMO | Admitting: Internal Medicine

## 2023-07-20 ENCOUNTER — Telehealth: Payer: Self-pay | Admitting: Medical Oncology

## 2023-07-20 ENCOUNTER — Other Ambulatory Visit: Payer: Self-pay | Admitting: Medical Oncology

## 2023-07-20 VITALS — BP 124/63 | HR 58 | Temp 97.9°F | Resp 16 | Wt 192.2 lb

## 2023-07-20 VITALS — BP 138/76 | HR 63 | Temp 98.2°F | Resp 17 | Wt 192.4 lb

## 2023-07-20 DIAGNOSIS — E785 Hyperlipidemia, unspecified: Secondary | ICD-10-CM | POA: Diagnosis not present

## 2023-07-20 DIAGNOSIS — K219 Gastro-esophageal reflux disease without esophagitis: Secondary | ICD-10-CM | POA: Diagnosis not present

## 2023-07-20 DIAGNOSIS — Z5112 Encounter for antineoplastic immunotherapy: Secondary | ICD-10-CM | POA: Diagnosis not present

## 2023-07-20 DIAGNOSIS — L659 Nonscarring hair loss, unspecified: Secondary | ICD-10-CM | POA: Diagnosis not present

## 2023-07-20 DIAGNOSIS — C9000 Multiple myeloma not having achieved remission: Secondary | ICD-10-CM

## 2023-07-20 DIAGNOSIS — K5900 Constipation, unspecified: Secondary | ICD-10-CM | POA: Diagnosis not present

## 2023-07-20 DIAGNOSIS — I10 Essential (primary) hypertension: Secondary | ICD-10-CM | POA: Diagnosis not present

## 2023-07-20 DIAGNOSIS — Z7961 Long term (current) use of immunomodulator: Secondary | ICD-10-CM | POA: Diagnosis not present

## 2023-07-20 DIAGNOSIS — R634 Abnormal weight loss: Secondary | ICD-10-CM | POA: Diagnosis not present

## 2023-07-20 LAB — CMP (CANCER CENTER ONLY)
ALT: 45 U/L — ABNORMAL HIGH (ref 0–44)
AST: 28 U/L (ref 15–41)
Albumin: 3.6 g/dL (ref 3.5–5.0)
Alkaline Phosphatase: 65 U/L (ref 38–126)
Anion gap: 6 (ref 5–15)
BUN: 30 mg/dL — ABNORMAL HIGH (ref 8–23)
CO2: 31 mmol/L (ref 22–32)
Calcium: 8.5 mg/dL — ABNORMAL LOW (ref 8.9–10.3)
Chloride: 106 mmol/L (ref 98–111)
Creatinine: 1.39 mg/dL — ABNORMAL HIGH (ref 0.44–1.00)
GFR, Estimated: 40 mL/min — ABNORMAL LOW (ref >60)
Glucose, Bld: 100 mg/dL — ABNORMAL HIGH (ref 70–99)
Potassium: 3.6 mmol/L (ref 3.5–5.1)
Sodium: 143 mmol/L (ref 135–145)
Total Bilirubin: 0.8 mg/dL (ref 0.0–1.2)
Total Protein: 6 g/dL — ABNORMAL LOW (ref 6.5–8.1)

## 2023-07-20 LAB — CBC WITH DIFFERENTIAL (CANCER CENTER ONLY)
Abs Immature Granulocytes: 0.11 10*3/uL — ABNORMAL HIGH (ref 0.00–0.07)
Basophils Absolute: 0 10*3/uL (ref 0.0–0.1)
Basophils Relative: 1 %
Eosinophils Absolute: 0.2 10*3/uL (ref 0.0–0.5)
Eosinophils Relative: 4 %
HCT: 32.9 % — ABNORMAL LOW (ref 36.0–46.0)
Hemoglobin: 10.8 g/dL — ABNORMAL LOW (ref 12.0–15.0)
Immature Granulocytes: 2 %
Lymphocytes Relative: 30 %
Lymphs Abs: 1.5 10*3/uL (ref 0.7–4.0)
MCH: 30.2 pg (ref 26.0–34.0)
MCHC: 32.8 g/dL (ref 30.0–36.0)
MCV: 91.9 fL (ref 80.0–100.0)
Monocytes Absolute: 0.9 10*3/uL (ref 0.1–1.0)
Monocytes Relative: 18 %
Neutro Abs: 2.2 10*3/uL (ref 1.7–7.7)
Neutrophils Relative %: 45 %
Platelet Count: 145 10*3/uL — ABNORMAL LOW (ref 150–400)
RBC: 3.58 MIL/uL — ABNORMAL LOW (ref 3.87–5.11)
RDW: 14.3 % (ref 11.5–15.5)
WBC Count: 4.9 10*3/uL (ref 4.0–10.5)
nRBC: 0 % (ref 0.0–0.2)

## 2023-07-20 MED ORDER — DIPHENHYDRAMINE HCL 25 MG PO CAPS
50.0000 mg | ORAL_CAPSULE | Freq: Once | ORAL | Status: AC
  Administered 2023-07-20: 50 mg via ORAL
  Filled 2023-07-20: qty 2

## 2023-07-20 MED ORDER — DEXAMETHASONE 4 MG PO TABS
20.0000 mg | ORAL_TABLET | Freq: Once | ORAL | Status: AC
Start: 2023-07-20 — End: 2023-07-20
  Administered 2023-07-20: 20 mg via ORAL
  Filled 2023-07-20: qty 5

## 2023-07-20 MED ORDER — ACETAMINOPHEN 325 MG PO TABS
650.0000 mg | ORAL_TABLET | Freq: Once | ORAL | Status: AC
  Administered 2023-07-20: 650 mg via ORAL
  Filled 2023-07-20: qty 2

## 2023-07-20 MED ORDER — BORTEZOMIB CHEMO SQ INJECTION 3.5 MG (2.5MG/ML)
1.3000 mg/m2 | Freq: Once | INTRAMUSCULAR | Status: AC
  Administered 2023-07-20: 2.75 mg via SUBCUTANEOUS
  Filled 2023-07-20: qty 1.1

## 2023-07-20 MED ORDER — DARATUMUMAB-HYALURONIDASE-FIHJ 1800-30000 MG-UT/15ML ~~LOC~~ SOLN
1800.0000 mg | Freq: Once | SUBCUTANEOUS | Status: AC
  Administered 2023-07-20: 1800 mg via SUBCUTANEOUS
  Filled 2023-07-20: qty 15

## 2023-07-20 NOTE — Progress Notes (Signed)
 [facility] [phone]   [phone]  OFFICE PROGRESS NOTE  [name], MD 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  PRIOR THERAPY:  1) Palliative radiotherapy to the plasmacytoma of the proximal right humerus under the care of Dr. Roselind Messier. 2) Status post tumor resection from the proximal humerus with reconstruction under the care of Dr. Jana Half at [facility] on May 01, 2021.  CURRENT THERAPY: Systemic treatment with subcutaneous daratumumab, subcutaneous Velcade on weekly basis, Revlimid 25 mg p.o. for 14 days every 3 weeks in addition to Decadron 20 mg p.o. weekly during the course of chemotherapy.  First dose May 27, 2023.  Status post 2 cycles.  INTERVAL HISTORY: Joanna Ray 73 y.o. female returns to the clinic today for follow-up visit. Discussed the use of AI scribe software for clinical note transcription with the patient, who gave verbal consent to proceed.  History of Present Illness   Joanna Ray is a 73 year old female with multiple myeloma, IgG subtype, who presents for day eight of cycle three of systemic therapy.  She has a history of multiple myeloma, IgG subtype, initially diagnosed as a plasmacytoma in February 2022, with progression to full-blown multiple myeloma in November 2024. She is currently undergoing systemic therapy with subcutaneous daratumumab and subcutaneous Velcade. She is post two cycles and is here for day eight of cycle number three.  She experiences constipation as a side effect of the treatment, which is relieved with medication. No nausea or vomiting. She notes a change in taste.  She has experienced a weight loss of about two to three pounds since her last visit two weeks ago.  No chest  pain or breathing issues. She has noticed some hair thinning and inquires if it will all fall out.       MEDICAL HISTORY: Past Medical History:  Diagnosis Date   Anxiety    Arthritis    Cancer (HCC) 2021   multiple myloma Right Arm   Cataract    GERD (gastroesophageal reflux disease)    Hyperlipidemia    Hypertension    Urticaria     ALLERGIES:  is allergic to corn-containing products and shellfish allergy.  MEDICATIONS:  Current Outpatient Medications  Medication Sig Dispense Refill   acyclovir (ZOVIRAX) 400 MG tablet Take 1 tablet (400 mg total) by mouth 2 (two) times daily. 60 tablet 5   ALPRAZolam (XANAX) 0.5 MG tablet Take 1 tablet by mouth twice daily as needed for anxiety 60 tablet 5   apixaban (ELIQUIS) 2.5 MG TABS tablet Take 1 tablet (2.5 mg total) by mouth 2 (two) times daily. 60 tablet 2   aspirin 81 MG tablet Take 81 mg by mouth daily.     cholecalciferol (VITAMIN D) 1000 UNITS tablet Take 1,000 Units by mouth daily.     cyclobenzaprine (FLEXERIL) 10 MG tablet Take 1 tablet by mouth three times daily as needed for muscle spasm 90 tablet 0   dexamethasone (DECADRON) 4 MG tablet Take 5 tabs (20 mg) weekly the day after daratumumab for 12 weeks. Take with breakfast. 20 tablet 5   EPINEPHrine (EPIPEN 2-PAK) 0.3 mg/0.3 mL IJ SOAJ injection Inject 0.3 mg into the muscle as needed for anaphylaxis. 1 each PRN   fexofenadine (ALLEGRA) 180 MG tablet Take 1 tablet (180 mg  total) by mouth daily. 90 tablet 1   HYDROcodone-acetaminophen (NORCO/VICODIN) 5-325 MG tablet Take 1 tablet by mouth every 6 (six) hours as needed for moderate pain (pain score 4-6). 30 tablet 0   hydrocortisone valerate cream (WESTCORT) 0.2 % 1 app     lenalidomide (REVLIMID) 25 MG capsule Take 1 capsule (25 mg total) by mouth daily. TAKE 1 CAPSULE BY MOUTH EVERY DAY FOR 14 DAYS, TAKE 7 DAYS OFF [employer] Auth # [id number] Date Obtained 07/13/2023 14 capsule 0   meclizine (ANTIVERT) 25 MG tablet Take 1 tablet (25 mg  total) by mouth 3 (three) times daily as needed for dizziness. 30 tablet 0   ondansetron (ZOFRAN) 8 MG tablet Take 1 tablet (8 mg total) by mouth every 8 (eight) hours as needed for nausea or vomiting. 30 tablet 1   prochlorperazine (COMPAZINE) 10 MG tablet Take 1 tablet (10 mg total) by mouth every 6 (six) hours as needed for nausea or vomiting. 30 tablet 1   rosuvastatin (CRESTOR) 20 MG tablet TAKE 1/2 TABLET ONE TIME DAILY 45 tablet 3   valsartan-hydrochlorothiazide (DIOVAN-HCT) 160-25 MG tablet TAKE 1 TABLET EVERY DAY 90 tablet 3   vitamin E 100 UNIT capsule Take 100 Units by mouth daily.     No current facility-administered medications for this visit.    SURGICAL HISTORY:  Past Surgical History:  Procedure Laterality Date   CATARACT EXTRACTION, BILATERAL Bilateral    DILATION AND CURETTAGE OF UTERUS      REVIEW OF SYSTEMS:  A comprehensive review of systems was negative except for: Ears, nose, mouth, throat, and face: positive for change in taste Gastrointestinal: positive for constipation   PHYSICAL EXAMINATION: General appearance: alert, cooperative, and no distress Head: Normocephalic, without obvious abnormality, atraumatic Neck: no adenopathy, no JVD, supple, symmetrical, trachea midline, and thyroid not enlarged, symmetric, no tenderness/mass/nodules Lymph nodes: Cervical, supraclavicular, and axillary nodes normal. Resp: clear to auscultation bilaterally Back: symmetric, no curvature. ROM normal. No CVA tenderness. Cardio: regular rate and rhythm, S1, S2 normal, no murmur, click, rub or gallop GI: soft, non-tender; bowel sounds normal; no masses,  no organomegaly Extremities: extremities normal, atraumatic, no cyanosis or edema and No edema  ECOG PERFORMANCE STATUS: 1 - Symptomatic but completely ambulatory  Blood pressure 138/76, pulse 63, temperature 98.2 F (36.8 C), temperature source Temporal, resp. rate 17, weight 192 lb 6.4 oz (87.3 kg), SpO2 100%.  LABORATORY  DATA: Lab Results  Component Value Date   WBC 4.9 07/20/2023   HGB 10.8 (L) 07/20/2023   HCT 32.9 (L) 07/20/2023   MCV 91.9 07/20/2023   PLT 145 (L) 07/20/2023      Chemistry      Component Value Date/Time   NA 143 07/15/2023 0946   NA 145 (H) 12/28/2018 1436   K 3.8 07/15/2023 0946   CL 106 07/15/2023 0946   CO2 26 07/15/2023 0946   BUN 44 (H) 07/15/2023 0946   BUN 15 12/28/2018 1436   CREATININE 1.84 (H) 07/15/2023 0946      Component Value Date/Time   CALCIUM 9.4 07/15/2023 0946   ALKPHOS 63 07/13/2023 1014   AST 37 07/13/2023 1014   ALT 45 (H) 07/13/2023 1014   BILITOT 0.8 07/13/2023 1014       RADIOGRAPHIC STUDIES: No results found.   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  The patient had extensive work-up for multiple myeloma including  a bone marrow biopsy and aspirate that showed only 6% plasma cells.  Her protein studies still suspicious for underlying MGUS/multiple myeloma but the findings are not enough to call it multiple myeloma at this point. The patient underwent radiotherapy to the plasmacytoma of the proximal right humerus and tolerated the procedure fairly well.  She is currently on observation.  The skeletal bone survey showed no other lytic bone lesion except the proximal right humerus. The patient was treated with palliative radiotherapy in the past but recent skeletal bone survey showed progression of the lytic lesion in the proximal humeral diaphysis with pathologic fracture.  She underwent surgical resection of the tumor with reconstruction under the care of Dr. Jana Half at [facility] on May 01, 2021.   The patient was found on recent protein study to have elevated IgG level.  She underwent a bone marrow biopsy and aspirate that showed 10-20% plasma cells. She had skeletal bone survey performed recently that showed new lytic  lesions in the proximal left femoral shaft and midshaft of the right femur consistent with myelomatous involvement or other lytic metastasis.  There was also scattered areas of endosteal scalloping in the left humeral shaft concerning for myelomatous involvement. The patient started systemic treatment with subcutaneous daratumumab, subcutaneous Velcade, Revlimid and Decadron every 3 weeks cycle on May 27, 2023.  She status post 2 cycles and tolerated the first cycle of her treatment fairly well. We will also consider the patient for treatment with Zometa infusion after dental clearance.     Multiple Myeloma, IgG subtype Chaslyn Eisen, a 73 year old female, has multiple myeloma, IgG subtype, initially presenting as a plasmacytoma in February 2022 and progressing to full-blown multiple myeloma in November 2024. She is currently on day eight of cycle three of systemic therapy with subcutaneous daratumumab and subcutaneous Velcade. She reports mild side effects including constipation, managed with medication, and changes in taste. She has experienced a slight weight loss of about two to three pounds since her last visit. There are no reports of nausea, vomiting, chest pain, or breathing issues. Hair thinning is noted but complete hair loss is not expected. Overall, she is tolerating the treatment well. - Continue subcutaneous daratumumab and subcutaneous Velcade as part of cycle three treatment. - Schedule follow-up appointment in three weeks for the start of cycle four.  Constipation Constipation is a side effect of the current treatment regimen, effectively managed with medication as needed. - Continue current management for constipation as needed.  Weight loss She reports a weight loss of about two to three pounds since the last visit, likely related to treatment side effects such as changes in taste. - Monitor weight and nutritional status.  Alopecia She reports hair thinning, a known side  effect of the treatment regimen. Complete hair loss is not anticipated. - Monitor hair thinning and provide reassurance.   The patient was advised to call immediately if she has any concerning symptoms in the interval. The patient voices understanding of current disease status and treatment options and is in agreement with the current care plan.  All questions were answered. The patient knows to call the clinic with any problems, questions or concerns. We can certainly see the patient much sooner if necessary.  The total time spent in the appointment was 20 minutes.  Disclaimer: This note was dictated with voice recognition software. Similar sounding words can inadvertently be transcribed and may not be corrected upon review.

## 2023-07-20 NOTE — Patient Instructions (Signed)
 [facility]  Discharge Instructions: Thank you for choosing [facility] to provide your oncology and hematology care.   If you have a lab appointment with the Cancer Center, please go directly to the Cancer Center and check in at the registration area.   Wear comfortable clothing and clothing appropriate for easy access to any Portacath or PICC line.   We strive to give you quality time with your provider. You may need to reschedule your appointment if you arrive late (15 or more minutes).  Arriving late affects you and other patients whose appointments are after yours.  Also, if you miss three or more appointments without notifying the office, you may be dismissed from the clinic at the provider's discretion.      For prescription refill requests, have your pharmacy contact our office and allow 72 hours for refills to be completed.    Today you received the following chemotherapy and/or immunotherapy agents: Velcade, Dara Faspro.       To help prevent nausea and vomiting after your treatment, we encourage you to take your nausea medication as directed.  BELOW ARE SYMPTOMS THAT SHOULD BE REPORTED IMMEDIATELY: *FEVER GREATER THAN 100.4 F (38 C) OR HIGHER *CHILLS OR SWEATING *NAUSEA AND VOMITING THAT IS NOT CONTROLLED WITH YOUR NAUSEA MEDICATION *UNUSUAL SHORTNESS OF BREATH *UNUSUAL BRUISING OR BLEEDING *URINARY PROBLEMS (pain or burning when urinating, or frequent urination) *BOWEL PROBLEMS (unusual diarrhea, constipation, pain near the anus) TENDERNESS IN MOUTH AND THROAT WITH OR WITHOUT PRESENCE OF ULCERS (sore throat, sores in mouth, or a toothache) UNUSUAL RASH, SWELLING OR PAIN  UNUSUAL VAGINAL DISCHARGE OR ITCHING   Items with * indicate a potential emergency and should be followed up as soon as possible or go to the Emergency Department if any problems should occur.  Please show the CHEMOTHERAPY ALERT CARD or  IMMUNOTHERAPY ALERT CARD at check-in to the Emergency Department and triage nurse.  Should you have questions after your visit or need to cancel or reschedule your appointment, please contact [facility]  Dept: [phone]  and follow the prompts.  Office hours are 8:00 a.m. to 4:30 p.m. Monday - Friday. Please note that voicemails left after 4:00 p.m. may not be returned until the following business day.  We are closed weekends and major holidays. You have access to a nurse at all times for urgent questions. Please call the main number to the clinic Dept: [phone] and follow the prompts.   For any non-urgent questions, you may also contact your provider using MyChart. We now offer e-Visits for anyone 75 and older to request care online for non-urgent symptoms. For details visit [email or web address].   Also download the MyChart app! Go to the app store, search "MyChart", open the app, select , and log in with your MyChart username and password.

## 2023-07-20 NOTE — Telephone Encounter (Signed)
 LVM and requested dental clearance for bisphonates-I.e Zometa

## 2023-07-22 ENCOUNTER — Other Ambulatory Visit

## 2023-07-22 ENCOUNTER — Ambulatory Visit: Payer: Medicare HMO | Admitting: Internal Medicine

## 2023-07-22 DIAGNOSIS — I10 Essential (primary) hypertension: Secondary | ICD-10-CM

## 2023-07-22 DIAGNOSIS — E7800 Pure hypercholesterolemia, unspecified: Secondary | ICD-10-CM

## 2023-07-22 NOTE — Progress Notes (Signed)
 Patient Care Team: [name], MD as PCP - General (Internal Medicine)  Visit Date: 07/23/23  Subjective:   Chief Complaint  Patient presents with   6 month follow up  Patient HY:QMVHQI I Shostak,Female DOB:[birth year],72 y.o. ONG:[id number]   73 y.o. Female presents today for 6 months follow-up for Hypertension, Hyperlipidemia, Anxiety, Vitamin-D Deficiency, and Multiple Myeloma. Patient has a past medical history of Arthritis, Cataract, GERD, and Urticaria. Annual visit 9/24/024, seen back in this office in 01/2023 and 02/2023. In the interim has seen at PT x6 from 04/15/2023 - 05/25/2023, at Oncology x6 by Dr. Arbutus Ped and Cassandra Heilingoetter, PA-C from 04/27/2023 - 07/20/2023.   History of Hypertension treated with Valsartan-HCTZ 160-25 mg daily. Blood Pressure: normotensive today at 116/66.   History of Hyperlipidemia treated with Rosuvastatin 10 mg daily. 07/22/2023 Lipid Panel with LDL 107, decreased from 109 on 3/20, but elevated from 94 in 06/2022     History of Anxiety treated with Xanax 0.5 mg twice daily as needed.Marland Kitchen  History of Plasmacytoma, Proximal Right Humerus diagnosed in February 2022 progressed into Multiple Myeloma diagnosed . Followed by her Oncologist, Dr. Si Gaul, every 6 months, and she last saw him 3/25 when she received an infusion of Bortezomib 2.75 mg and Daratumab - Hyaluronidase - fihj 1800 mg. MM Panel 04/27/2023 with Immunofixation showing a biclonal IgG protein with kappa specificity. Previously received palliative radiotherapy to the plasmacytoma, proximal right humerus under the care of Dr. Roselind Messier and she is status-post tumor resection from the proximal humerus with reconstruction under the care of Dr. Jana Half at [facility] on May 01, 2021. She says that her sense of taste of has been disrupted due to the medication therapy she is on, which she has 4 weeks left out of 12, and then will proceed with stem cell therapy.  History of  Vitamin-D Deficiency treated with Vitamin D 1,000 units daily.  Reviewed BMP 07/22/2023: Blood Glucose 145; BUN 41; Creatinine 1.42; BUN/Creatinine 29; otherwise WNL.  Past Medical History:  Diagnosis Date   Anxiety    Arthritis    Cancer (HCC) 2021   multiple myloma Right Arm   Cataract    GERD (gastroesophageal reflux disease)    Hyperlipidemia    Hypertension    Urticaria     Allergies  Allergen Reactions   Corn-Containing Products Swelling    Rash    Shellfish Allergy Swelling    Swelling of lips    Family History  Problem Relation Age of Onset   Stroke Mother    Stroke Father    Diabetes Sister    Breast cancer Sister 46   Ovarian cancer Maternal Grandmother 55   Colon cancer Neg Hx    Colon polyps Neg Hx    Esophageal cancer Neg Hx    Rectal cancer Neg Hx    Stomach cancer Neg Hx    Social History   Social History Narrative   Not on file   Review of Systems  Constitutional:  Positive for malaise/fatigue (associated with her condition and treatment therapy). Negative for fever.  HENT:  Negative for congestion.   Eyes:  Negative for blurred vision.  Respiratory:  Negative for cough and shortness of breath.   Cardiovascular:  Negative for chest pain, palpitations and leg swelling.  Gastrointestinal:  Negative for vomiting.  Musculoskeletal:  Negative for back pain.  Skin:  Negative for rash.  Neurological:  Negative for loss of consciousness and headaches.     Objective:  Vitals:  BP 116/66 (BP Location: Left Arm, Patient Position: Sitting, Cuff Size: Normal)   Pulse 60   Temp 97.9 F (36.6 C) (Temporal)   Resp 12   Ht 5\' 5"  (1.651 m)   Wt 191 lb 1.9 oz (86.7 kg)   SpO2 96%   BMI 31.80 kg/m   Physical Exam Vitals and nursing note reviewed.  Constitutional:      General: She is not in acute distress.    Appearance: Normal appearance. She is not toxic-appearing.  HENT:     Head: Normocephalic and atraumatic.  Cardiovascular:     Rate and Rhythm:  Normal rate and regular rhythm. No extrasystoles are present.    Pulses: Normal pulses.     Heart sounds: Normal heart sounds. No murmur heard.    No friction rub. No gallop.  Pulmonary:     Effort: Pulmonary effort is normal. No respiratory distress.     Breath sounds: Normal breath sounds. No wheezing or rales.  Musculoskeletal:     Right lower leg: Edema present.     Left lower leg: Edema present.  Skin:    General: Skin is warm and dry.  Neurological:     Mental Status: She is alert and oriented to person, place, and time. Mental status is at baseline.  Psychiatric:        Mood and Affect: Mood normal.        Behavior: Behavior normal.        Thought Content: Thought content normal.        Judgment: Judgment normal.     Results:  Studies Obtained And Personally Reviewed By Me: Labs:     Component Value Date/Time   NA 144 07/22/2023 0956   NA 145 (H) 12/28/2018 1436   K 4.7 07/22/2023 0956   CL 108 07/22/2023 0956   CO2 26 07/22/2023 0956   GLUCOSE 145 (H) 07/22/2023 0956   BUN 41 (H) 07/22/2023 0956   BUN 15 12/28/2018 1436   CREATININE 1.42 (H) 07/22/2023 0956   CALCIUM 9.4 07/22/2023 0956   PROT 6.0 (L) 07/20/2023 0812   PROT 7.7 12/28/2018 1436   ALBUMIN 3.6 07/20/2023 0812   ALBUMIN 4.6 12/28/2018 1436   AST 28 07/20/2023 0812   ALT 45 (H) 07/20/2023 0812   ALKPHOS 65 07/20/2023 0812   BILITOT 0.8 07/20/2023 0812   GFRNONAA 40 (L) 07/20/2023 0812   GFRNONAA 50 (L) 09/03/2020 0909   GFRAA 58 (L) 09/03/2020 0909    Lab Results  Component Value Date   WBC 4.9 07/20/2023   HGB 10.8 (L) 07/20/2023   HCT 32.9 (L) 07/20/2023   MCV 91.9 07/20/2023   PLT 145 (L) 07/20/2023   Lab Results  Component Value Date   CHOL 187 07/22/2023   HDL 61 07/22/2023   LDLCALC 107 (H) 07/22/2023   TRIG 99 07/22/2023   CHOLHDL 3.1 07/22/2023   Lab Results  Component Value Date   HGBA1C 5.7 (H) 09/03/2020    Lab Results  Component Value Date   TSH 1.77 01/14/2023     Assessment & Plan:  Hypertension treated with Valsartan-HCTZ 160-25 mg daily. Blood Pressure: normotensive today at 116/66.   Hyperlipidemia treated with Rosuvastatin 10 mg daily. 07/22/2023 Lipid Panel with LDL 107, decreased from 109 on 3/20, but elevated from 94 in 06/2022     Anxiety treated with Xanax 0.5 mg twice daily as needed.Marland Kitchen  History of Plasmacytoma, Proximal Right Humerus diagnosed in February 2022 progressed into Multiple Myeloma diagnosed .  Followed by her Oncologist, Dr. Si Gaul, every 6 months, and she last saw him 3/25 when she received an infusion of Bortezomib 2.75 mg and Daratumab - Hyaluronidase - fihj 1800 mg. MM Panel 04/27/2023 with Immunofixation showing a biclonal IgG protein with kappa specificity. She says that her sense of taste of has been disrupted due to the medication therapy she is on, which she has 4 weeks left out of 12, and then will proceed with stem cell therapy.  Vitamin-D Deficiency treated with Vitamin D 1,000 units daily.  Reviewed BMP 07/22/2023: Blood Glucose 145; BUN 41; Creatinine 1.42; BUN/Creatinine 29; otherwise WNL.    I,Emily Lagle,acting as a Neurosurgeon for [name], MD.,have documented all relevant documentation on the behalf of [name], MD,as directed by  [name], MD while in the presence of [name], MD.   I, [name], MD, have reviewed all documentation for this visit. The documentation on 07/25/23 for the exam, diagnosis, procedures, and orders are all accurate and complete.

## 2023-07-22 NOTE — Progress Notes (Signed)
 Lab only

## 2023-07-23 ENCOUNTER — Ambulatory Visit (INDEPENDENT_AMBULATORY_CARE_PROVIDER_SITE_OTHER): Admitting: Internal Medicine

## 2023-07-23 ENCOUNTER — Encounter: Payer: Self-pay | Admitting: Internal Medicine

## 2023-07-23 VITALS — BP 116/66 | HR 60 | Temp 97.9°F | Resp 12 | Ht 65.0 in | Wt 191.1 lb

## 2023-07-23 DIAGNOSIS — C9000 Multiple myeloma not having achieved remission: Secondary | ICD-10-CM

## 2023-07-23 DIAGNOSIS — Z8659 Personal history of other mental and behavioral disorders: Secondary | ICD-10-CM

## 2023-07-23 DIAGNOSIS — I10 Essential (primary) hypertension: Secondary | ICD-10-CM | POA: Diagnosis not present

## 2023-07-23 DIAGNOSIS — E7800 Pure hypercholesterolemia, unspecified: Secondary | ICD-10-CM

## 2023-07-23 DIAGNOSIS — Z8579 Personal history of other malignant neoplasms of lymphoid, hematopoietic and related tissues: Secondary | ICD-10-CM

## 2023-07-23 LAB — LIPID PANEL
Cholesterol: 187 mg/dL (ref <200)
HDL: 61 mg/dL (ref >=50)
LDL Cholesterol (Calc): 107 mg/dL — ABNORMAL HIGH
Non-HDL Cholesterol (Calc): 126 mg/dL (ref <130)
Total CHOL/HDL Ratio: 3.1 (calc) (ref <5.0)
Triglycerides: 99 mg/dL (ref <150)

## 2023-07-23 LAB — BASIC METABOLIC PANEL WITH GFR
BUN/Creatinine Ratio: 29 (calc) — ABNORMAL HIGH (ref 6–22)
BUN: 41 mg/dL — ABNORMAL HIGH (ref 7–25)
CO2: 26 mmol/L (ref 20–32)
Calcium: 9.4 mg/dL (ref 8.6–10.4)
Chloride: 108 mmol/L (ref 98–110)
Creat: 1.42 mg/dL — ABNORMAL HIGH (ref 0.60–1.00)
Glucose, Bld: 145 mg/dL — ABNORMAL HIGH (ref 65–99)
Potassium: 4.7 mmol/L (ref 3.5–5.3)
Sodium: 144 mmol/L (ref 135–146)
eGFR: 39 mL/min/{1.73_m2} — ABNORMAL LOW (ref >=60)

## 2023-07-23 NOTE — Patient Instructions (Addendum)
 Patient will continue treatment with Oncology. BP is stable. Asked patient to avoid sick individuals and wear mask as needed in crowded settings. Bone marrow transplant is being considered. Continue current meds and follow up for Annual wellness visit and Medicare visit in 6 months.

## 2023-07-24 ENCOUNTER — Other Ambulatory Visit: Payer: Self-pay

## 2023-07-27 ENCOUNTER — Other Ambulatory Visit: Payer: Medicare HMO

## 2023-07-27 ENCOUNTER — Ambulatory Visit: Payer: Medicare HMO | Admitting: Internal Medicine

## 2023-07-27 ENCOUNTER — Inpatient Hospital Stay: Payer: Medicare HMO | Attending: Internal Medicine

## 2023-07-27 ENCOUNTER — Inpatient Hospital Stay: Payer: Medicare HMO

## 2023-07-27 VITALS — BP 128/68 | HR 56 | Temp 98.3°F | Resp 18

## 2023-07-27 DIAGNOSIS — E876 Hypokalemia: Secondary | ICD-10-CM | POA: Diagnosis not present

## 2023-07-27 DIAGNOSIS — Z9221 Personal history of antineoplastic chemotherapy: Secondary | ICD-10-CM | POA: Diagnosis not present

## 2023-07-27 DIAGNOSIS — K219 Gastro-esophageal reflux disease without esophagitis: Secondary | ICD-10-CM | POA: Insufficient documentation

## 2023-07-27 DIAGNOSIS — C9000 Multiple myeloma not having achieved remission: Secondary | ICD-10-CM | POA: Diagnosis not present

## 2023-07-27 DIAGNOSIS — R600 Localized edema: Secondary | ICD-10-CM | POA: Diagnosis not present

## 2023-07-27 DIAGNOSIS — Z923 Personal history of irradiation: Secondary | ICD-10-CM | POA: Insufficient documentation

## 2023-07-27 DIAGNOSIS — Z7961 Long term (current) use of immunomodulator: Secondary | ICD-10-CM | POA: Insufficient documentation

## 2023-07-27 DIAGNOSIS — R197 Diarrhea, unspecified: Secondary | ICD-10-CM | POA: Diagnosis not present

## 2023-07-27 DIAGNOSIS — G893 Neoplasm related pain (acute) (chronic): Secondary | ICD-10-CM | POA: Insufficient documentation

## 2023-07-27 DIAGNOSIS — M25471 Effusion, right ankle: Secondary | ICD-10-CM | POA: Insufficient documentation

## 2023-07-27 DIAGNOSIS — E785 Hyperlipidemia, unspecified: Secondary | ICD-10-CM | POA: Diagnosis not present

## 2023-07-27 DIAGNOSIS — Z79899 Other long term (current) drug therapy: Secondary | ICD-10-CM | POA: Insufficient documentation

## 2023-07-27 DIAGNOSIS — Z79624 Long term (current) use of inhibitors of nucleotide synthesis: Secondary | ICD-10-CM | POA: Insufficient documentation

## 2023-07-27 DIAGNOSIS — R5383 Other fatigue: Secondary | ICD-10-CM | POA: Diagnosis not present

## 2023-07-27 DIAGNOSIS — R682 Dry mouth, unspecified: Secondary | ICD-10-CM | POA: Diagnosis not present

## 2023-07-27 DIAGNOSIS — Z5112 Encounter for antineoplastic immunotherapy: Secondary | ICD-10-CM | POA: Insufficient documentation

## 2023-07-27 DIAGNOSIS — Z7952 Long term (current) use of systemic steroids: Secondary | ICD-10-CM | POA: Insufficient documentation

## 2023-07-27 DIAGNOSIS — I509 Heart failure, unspecified: Secondary | ICD-10-CM | POA: Diagnosis not present

## 2023-07-27 DIAGNOSIS — M25472 Effusion, left ankle: Secondary | ICD-10-CM | POA: Insufficient documentation

## 2023-07-27 DIAGNOSIS — I10 Essential (primary) hypertension: Secondary | ICD-10-CM | POA: Diagnosis not present

## 2023-07-27 LAB — CMP (CANCER CENTER ONLY)
ALT: 22 U/L (ref 0–44)
AST: 16 U/L (ref 15–41)
Albumin: 3.6 g/dL (ref 3.5–5.0)
Alkaline Phosphatase: 48 U/L (ref 38–126)
Anion gap: 4 — ABNORMAL LOW (ref 5–15)
BUN: 16 mg/dL (ref 8–23)
CO2: 31 mmol/L (ref 22–32)
Calcium: 8.4 mg/dL — ABNORMAL LOW (ref 8.9–10.3)
Chloride: 106 mmol/L (ref 98–111)
Creatinine: 1.1 mg/dL — ABNORMAL HIGH (ref 0.44–1.00)
GFR, Estimated: 53 mL/min — ABNORMAL LOW (ref >60)
Glucose, Bld: 89 mg/dL (ref 70–99)
Potassium: 3.5 mmol/L (ref 3.5–5.1)
Sodium: 141 mmol/L (ref 135–145)
Total Bilirubin: 1.4 mg/dL — ABNORMAL HIGH (ref 0.0–1.2)
Total Protein: 5.6 g/dL — ABNORMAL LOW (ref 6.5–8.1)

## 2023-07-27 LAB — CBC WITH DIFFERENTIAL (CANCER CENTER ONLY)
Abs Immature Granulocytes: 0.22 10*3/uL — ABNORMAL HIGH (ref 0.00–0.07)
Basophils Absolute: 0 10*3/uL (ref 0.0–0.1)
Basophils Relative: 1 %
Eosinophils Absolute: 0.2 10*3/uL (ref 0.0–0.5)
Eosinophils Relative: 6 %
HCT: 30.1 % — ABNORMAL LOW (ref 36.0–46.0)
Hemoglobin: 9.9 g/dL — ABNORMAL LOW (ref 12.0–15.0)
Immature Granulocytes: 6 %
Lymphocytes Relative: 20 %
Lymphs Abs: 0.8 10*3/uL (ref 0.7–4.0)
MCH: 29.6 pg (ref 26.0–34.0)
MCHC: 32.9 g/dL (ref 30.0–36.0)
MCV: 89.9 fL (ref 80.0–100.0)
Monocytes Absolute: 0.4 10*3/uL (ref 0.1–1.0)
Monocytes Relative: 10 %
Neutro Abs: 2.2 10*3/uL (ref 1.7–7.7)
Neutrophils Relative %: 57 %
Platelet Count: 119 10*3/uL — ABNORMAL LOW (ref 150–400)
RBC: 3.35 MIL/uL — ABNORMAL LOW (ref 3.87–5.11)
RDW: 14.6 % (ref 11.5–15.5)
WBC Count: 3.8 10*3/uL — ABNORMAL LOW (ref 4.0–10.5)
nRBC: 0 % (ref 0.0–0.2)

## 2023-07-27 MED ORDER — ACETAMINOPHEN 325 MG PO TABS
650.0000 mg | ORAL_TABLET | Freq: Once | ORAL | Status: AC
  Administered 2023-07-27: 650 mg via ORAL
  Filled 2023-07-27: qty 2

## 2023-07-27 MED ORDER — DEXAMETHASONE 4 MG PO TABS
20.0000 mg | ORAL_TABLET | Freq: Once | ORAL | Status: AC
Start: 2023-07-27 — End: 2023-07-27
  Administered 2023-07-27: 20 mg via ORAL
  Filled 2023-07-27: qty 5

## 2023-07-27 MED ORDER — DARATUMUMAB-HYALURONIDASE-FIHJ 1800-30000 MG-UT/15ML ~~LOC~~ SOLN
1800.0000 mg | Freq: Once | SUBCUTANEOUS | Status: AC
  Administered 2023-07-27: 1800 mg via SUBCUTANEOUS
  Filled 2023-07-27: qty 15

## 2023-07-27 MED ORDER — DIPHENHYDRAMINE HCL 25 MG PO CAPS
50.0000 mg | ORAL_CAPSULE | Freq: Once | ORAL | Status: AC
Start: 2023-07-27 — End: 2023-07-27
  Administered 2023-07-27: 50 mg via ORAL
  Filled 2023-07-27: qty 2

## 2023-07-27 MED ORDER — BORTEZOMIB CHEMO SQ INJECTION 3.5 MG (2.5MG/ML)
1.3000 mg/m2 | Freq: Once | INTRAMUSCULAR | Status: AC
  Administered 2023-07-27: 2.75 mg via SUBCUTANEOUS
  Filled 2023-07-27: qty 1.1

## 2023-07-27 NOTE — Patient Instructions (Signed)
 [facility]  Discharge Instructions: Thank you for choosing [facility] to provide your oncology and hematology care.   If you have a lab appointment with the Cancer Center, please go directly to the Cancer Center and check in at the registration area.   Wear comfortable clothing and clothing appropriate for easy access to any Portacath or PICC line.   We strive to give you quality time with your provider. You may need to reschedule your appointment if you arrive late (15 or more minutes).  Arriving late affects you and other patients whose appointments are after yours.  Also, if you miss three or more appointments without notifying the office, you may be dismissed from the clinic at the provider's discretion.      For prescription refill requests, have your pharmacy contact our office and allow 72 hours for refills to be completed.    Today you received the following chemotherapy and/or immunotherapy agents: Velcade, Dara Faspro.       To help prevent nausea and vomiting after your treatment, we encourage you to take your nausea medication as directed.  BELOW ARE SYMPTOMS THAT SHOULD BE REPORTED IMMEDIATELY: *FEVER GREATER THAN 100.4 F (38 C) OR HIGHER *CHILLS OR SWEATING *NAUSEA AND VOMITING THAT IS NOT CONTROLLED WITH YOUR NAUSEA MEDICATION *UNUSUAL SHORTNESS OF BREATH *UNUSUAL BRUISING OR BLEEDING *URINARY PROBLEMS (pain or burning when urinating, or frequent urination) *BOWEL PROBLEMS (unusual diarrhea, constipation, pain near the anus) TENDERNESS IN MOUTH AND THROAT WITH OR WITHOUT PRESENCE OF ULCERS (sore throat, sores in mouth, or a toothache) UNUSUAL RASH, SWELLING OR PAIN  UNUSUAL VAGINAL DISCHARGE OR ITCHING   Items with * indicate a potential emergency and should be followed up as soon as possible or go to the Emergency Department if any problems should occur.  Please show the CHEMOTHERAPY ALERT CARD or  IMMUNOTHERAPY ALERT CARD at check-in to the Emergency Department and triage nurse.  Should you have questions after your visit or need to cancel or reschedule your appointment, please contact [facility]  Dept: [phone]  and follow the prompts.  Office hours are 8:00 a.m. to 4:30 p.m. Monday - Friday. Please note that voicemails left after 4:00 p.m. may not be returned until the following business day.  We are closed weekends and major holidays. You have access to a nurse at all times for urgent questions. Please call the main number to the clinic Dept: [phone] and follow the prompts.   For any non-urgent questions, you may also contact your provider using MyChart. We now offer e-Visits for anyone 75 and older to request care online for non-urgent symptoms. For details visit [email or web address].   Also download the MyChart app! Go to the app store, search "MyChart", open the app, select , and log in with your MyChart username and password.

## 2023-07-29 ENCOUNTER — Other Ambulatory Visit: Payer: Medicare HMO

## 2023-07-29 ENCOUNTER — Ambulatory Visit: Payer: Medicare HMO

## 2023-07-29 ENCOUNTER — Ambulatory Visit: Payer: Medicare HMO | Admitting: Internal Medicine

## 2023-08-01 ENCOUNTER — Other Ambulatory Visit: Payer: Self-pay | Admitting: Internal Medicine

## 2023-08-01 DIAGNOSIS — C9000 Multiple myeloma not having achieved remission: Secondary | ICD-10-CM

## 2023-08-03 ENCOUNTER — Encounter: Payer: Self-pay | Admitting: Medical Oncology

## 2023-08-03 ENCOUNTER — Inpatient Hospital Stay: Payer: Medicare HMO

## 2023-08-03 ENCOUNTER — Inpatient Hospital Stay: Payer: Medicare HMO | Admitting: Internal Medicine

## 2023-08-03 ENCOUNTER — Telehealth: Payer: Self-pay | Admitting: Medical Oncology

## 2023-08-03 VITALS — BP 124/52 | HR 50 | Temp 98.0°F | Resp 16 | Ht 65.0 in | Wt 191.6 lb

## 2023-08-03 DIAGNOSIS — C9000 Multiple myeloma not having achieved remission: Secondary | ICD-10-CM

## 2023-08-03 DIAGNOSIS — G893 Neoplasm related pain (acute) (chronic): Secondary | ICD-10-CM | POA: Diagnosis not present

## 2023-08-03 DIAGNOSIS — R197 Diarrhea, unspecified: Secondary | ICD-10-CM | POA: Diagnosis not present

## 2023-08-03 DIAGNOSIS — E876 Hypokalemia: Secondary | ICD-10-CM | POA: Diagnosis not present

## 2023-08-03 DIAGNOSIS — R682 Dry mouth, unspecified: Secondary | ICD-10-CM | POA: Diagnosis not present

## 2023-08-03 DIAGNOSIS — M25471 Effusion, right ankle: Secondary | ICD-10-CM | POA: Diagnosis not present

## 2023-08-03 DIAGNOSIS — M25472 Effusion, left ankle: Secondary | ICD-10-CM | POA: Diagnosis not present

## 2023-08-03 DIAGNOSIS — R5383 Other fatigue: Secondary | ICD-10-CM | POA: Diagnosis not present

## 2023-08-03 DIAGNOSIS — Z5112 Encounter for antineoplastic immunotherapy: Secondary | ICD-10-CM | POA: Diagnosis not present

## 2023-08-03 LAB — CBC WITH DIFFERENTIAL (CANCER CENTER ONLY)
Abs Immature Granulocytes: 0.14 10*3/uL — ABNORMAL HIGH (ref 0.00–0.07)
Basophils Absolute: 0 10*3/uL (ref 0.0–0.1)
Basophils Relative: 1 %
Eosinophils Absolute: 0.3 10*3/uL (ref 0.0–0.5)
Eosinophils Relative: 8 %
HCT: 30.4 % — ABNORMAL LOW (ref 36.0–46.0)
Hemoglobin: 10 g/dL — ABNORMAL LOW (ref 12.0–15.0)
Immature Granulocytes: 4 %
Lymphocytes Relative: 17 %
Lymphs Abs: 0.6 10*3/uL — ABNORMAL LOW (ref 0.7–4.0)
MCH: 29.8 pg (ref 26.0–34.0)
MCHC: 32.9 g/dL (ref 30.0–36.0)
MCV: 90.5 fL (ref 80.0–100.0)
Monocytes Absolute: 0.8 10*3/uL (ref 0.1–1.0)
Monocytes Relative: 24 %
Neutro Abs: 1.6 10*3/uL — ABNORMAL LOW (ref 1.7–7.7)
Neutrophils Relative %: 46 %
Platelet Count: 123 10*3/uL — ABNORMAL LOW (ref 150–400)
RBC: 3.36 MIL/uL — ABNORMAL LOW (ref 3.87–5.11)
RDW: 14.7 % (ref 11.5–15.5)
WBC Count: 3.4 10*3/uL — ABNORMAL LOW (ref 4.0–10.5)
nRBC: 0 % (ref 0.0–0.2)

## 2023-08-03 LAB — CMP (CANCER CENTER ONLY)
ALT: 20 U/L (ref 0–44)
AST: 22 U/L (ref 15–41)
Albumin: 3.7 g/dL (ref 3.5–5.0)
Alkaline Phosphatase: 47 U/L (ref 38–126)
Anion gap: 5 (ref 5–15)
BUN: 16 mg/dL (ref 8–23)
CO2: 32 mmol/L (ref 22–32)
Calcium: 8.6 mg/dL — ABNORMAL LOW (ref 8.9–10.3)
Chloride: 105 mmol/L (ref 98–111)
Creatinine: 1.22 mg/dL — ABNORMAL HIGH (ref 0.44–1.00)
GFR, Estimated: 47 mL/min — ABNORMAL LOW (ref >60)
Glucose, Bld: 92 mg/dL (ref 70–99)
Potassium: 3.7 mmol/L (ref 3.5–5.1)
Sodium: 142 mmol/L (ref 135–145)
Total Bilirubin: 1.3 mg/dL — ABNORMAL HIGH (ref 0.0–1.2)
Total Protein: 5.9 g/dL — ABNORMAL LOW (ref 6.5–8.1)

## 2023-08-03 MED ORDER — BORTEZOMIB CHEMO SQ INJECTION 3.5 MG (2.5MG/ML)
1.3000 mg/m2 | Freq: Once | INTRAMUSCULAR | Status: AC
  Administered 2023-08-03: 2.75 mg via SUBCUTANEOUS
  Filled 2023-08-03: qty 1.1

## 2023-08-03 MED ORDER — DEXAMETHASONE 4 MG PO TABS
20.0000 mg | ORAL_TABLET | Freq: Once | ORAL | Status: AC
Start: 2023-08-03 — End: 2023-08-03
  Administered 2023-08-03: 20 mg via ORAL
  Filled 2023-08-03: qty 5

## 2023-08-03 MED ORDER — ACETAMINOPHEN 325 MG PO TABS
650.0000 mg | ORAL_TABLET | Freq: Once | ORAL | Status: AC
  Administered 2023-08-03: 650 mg via ORAL
  Filled 2023-08-03: qty 2

## 2023-08-03 MED ORDER — DIPHENHYDRAMINE HCL 25 MG PO CAPS
50.0000 mg | ORAL_CAPSULE | Freq: Once | ORAL | Status: AC
  Administered 2023-08-03: 50 mg via ORAL
  Filled 2023-08-03: qty 2

## 2023-08-03 MED ORDER — DARATUMUMAB-HYALURONIDASE-FIHJ 1800-30000 MG-UT/15ML ~~LOC~~ SOLN
1800.0000 mg | Freq: Once | SUBCUTANEOUS | Status: AC
  Administered 2023-08-03: 1800 mg via SUBCUTANEOUS
  Filled 2023-08-03: qty 15

## 2023-08-03 NOTE — Patient Instructions (Signed)
 [facility]  Discharge Instructions: Thank you for choosing [facility] to provide your oncology and hematology care.   If you have a lab appointment with the Cancer Center, please go directly to the Cancer Center and check in at the registration area.   Wear comfortable clothing and clothing appropriate for easy access to any Portacath or PICC line.   We strive to give you quality time with your provider. You may need to reschedule your appointment if you arrive late (15 or more minutes).  Arriving late affects you and other patients whose appointments are after yours.  Also, if you miss three or more appointments without notifying the office, you may be dismissed from the clinic at the provider's discretion.      For prescription refill requests, have your pharmacy contact our office and allow 72 hours for refills to be completed.    Today you received the following chemotherapy and/or immunotherapy agents: Velcade, Dara Faspro.       To help prevent nausea and vomiting after your treatment, we encourage you to take your nausea medication as directed.  BELOW ARE SYMPTOMS THAT SHOULD BE REPORTED IMMEDIATELY: *FEVER GREATER THAN 100.4 F (38 C) OR HIGHER *CHILLS OR SWEATING *NAUSEA AND VOMITING THAT IS NOT CONTROLLED WITH YOUR NAUSEA MEDICATION *UNUSUAL SHORTNESS OF BREATH *UNUSUAL BRUISING OR BLEEDING *URINARY PROBLEMS (pain or burning when urinating, or frequent urination) *BOWEL PROBLEMS (unusual diarrhea, constipation, pain near the anus) TENDERNESS IN MOUTH AND THROAT WITH OR WITHOUT PRESENCE OF ULCERS (sore throat, sores in mouth, or a toothache) UNUSUAL RASH, SWELLING OR PAIN  UNUSUAL VAGINAL DISCHARGE OR ITCHING   Items with * indicate a potential emergency and should be followed up as soon as possible or go to the Emergency Department if any problems should occur.  Please show the CHEMOTHERAPY ALERT CARD or  IMMUNOTHERAPY ALERT CARD at check-in to the Emergency Department and triage nurse.  Should you have questions after your visit or need to cancel or reschedule your appointment, please contact [facility]  Dept: [phone]  and follow the prompts.  Office hours are 8:00 a.m. to 4:30 p.m. Monday - Friday. Please note that voicemails left after 4:00 p.m. may not be returned until the following business day.  We are closed weekends and major holidays. You have access to a nurse at all times for urgent questions. Please call the main number to the clinic Dept: [phone] and follow the prompts.   For any non-urgent questions, you may also contact your provider using MyChart. We now offer e-Visits for anyone 75 and older to request care online for non-urgent symptoms. For details visit [email or web address].   Also download the MyChart app! Go to the app store, search "MyChart", open the app, select , and log in with your MyChart username and password.

## 2023-08-03 NOTE — Progress Notes (Signed)
 [facility] [phone]   [phone]  OFFICE PROGRESS NOTE  [name], MD [address] 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  PRIOR THERAPY:  1) Palliative radiotherapy to the plasmacytoma of the proximal right humerus under the care of Dr. Roselind Messier. 2) Status post tumor resection from the proximal humerus with reconstruction under the care of Dr. Jana Half at [facility] on May 01, 2021.  CURRENT THERAPY: Systemic treatment with subcutaneous daratumumab, subcutaneous Velcade on weekly basis, Revlimid 25 mg p.o. for 14 days every 3 weeks in addition to Decadron 20 mg p.o. weekly during the course of chemotherapy.  First dose May 27, 2023.  Status post 3 cycles.  INTERVAL HISTORY: Joanna Ray 73 y.o. female returns to the clinic today for follow-up visit. Discussed the use of AI scribe software for clinical note transcription with the patient, who gave verbal consent to proceed.  History of Present Illness   Joanna Ray is a 73 year old female with multiple myeloma who presents for evaluation before starting cycle number four of treatment.  She is undergoing systemic treatment for multiple myeloma with subcutaneous daratumumab, subcutaneous Velcade on a weekly basis, Revlimid, and Decadron every three weeks. She has completed three cycles and is here for evaluation before starting cycle number four.  She experiences diarrhea after her treatments, lasting about 24 to 48 hours, occurring approximately eight times a day. She manages this with Imodium. No nausea or vomiting.  She has experienced some swelling in her feet and ankles. She follows a low-salt diet, as her husband has congestive heart failure.        MEDICAL HISTORY: Past Medical  History:  Diagnosis Date   Anxiety    Arthritis    Cancer (HCC) 2021   multiple myloma Right Arm   Cataract    GERD (gastroesophageal reflux disease)    Hyperlipidemia    Hypertension    Urticaria     ALLERGIES:  is allergic to corn-containing products and shellfish allergy.  MEDICATIONS:  Current Outpatient Medications  Medication Sig Dispense Refill   acyclovir (ZOVIRAX) 400 MG tablet Take 1 tablet (400 mg total) by mouth 2 (two) times daily. 60 tablet 5   ALPRAZolam (XANAX) 0.5 MG tablet Take 1 tablet by mouth twice daily as needed for anxiety 60 tablet 5   apixaban (ELIQUIS) 2.5 MG TABS tablet Take 1 tablet (2.5 mg total) by mouth 2 (two) times daily. 60 tablet 2   aspirin 81 MG tablet Take 81 mg by mouth daily.     cholecalciferol (VITAMIN D) 1000 UNITS tablet Take 1,000 Units by mouth daily.     cyclobenzaprine (FLEXERIL) 10 MG tablet Take 1 tablet by mouth three times daily as needed for muscle spasm 90 tablet 0   dexamethasone (DECADRON) 4 MG tablet Take 5 tabs (20 mg) weekly the day after daratumumab for 12 weeks. Take with breakfast. 20 tablet 5   EPINEPHrine (EPIPEN 2-PAK) 0.3 mg/0.3 mL IJ SOAJ injection Inject 0.3 mg into the muscle as needed for anaphylaxis. 1 each PRN   fexofenadine (ALLEGRA) 180 MG tablet Take 1 tablet (180 mg total) by mouth daily. 90 tablet 1   HYDROcodone-acetaminophen (NORCO/VICODIN) 5-325 MG tablet Take 1 tablet by mouth every 6 (six) hours as needed for moderate pain (pain score 4-6). 30  tablet 0   hydrocortisone valerate cream (WESTCORT) 0.2 % 1 app     lenalidomide (REVLIMID) 25 MG capsule TAKE 1 CAPSULE BY MOUTH EVERY DAY FOR 14 DAYS, TAKE 7 DAYS OFF 14 capsule 0   meclizine (ANTIVERT) 25 MG tablet Take 1 tablet (25 mg total) by mouth 3 (three) times daily as needed for dizziness. 30 tablet 0   ondansetron (ZOFRAN) 8 MG tablet Take 1 tablet (8 mg total) by mouth every 8 (eight) hours as needed for nausea or vomiting. 30 tablet 1    prochlorperazine (COMPAZINE) 10 MG tablet Take 1 tablet (10 mg total) by mouth every 6 (six) hours as needed for nausea or vomiting. 30 tablet 1   rosuvastatin (CRESTOR) 20 MG tablet TAKE 1/2 TABLET ONE TIME DAILY 45 tablet 3   valsartan-hydrochlorothiazide (DIOVAN-HCT) 160-25 MG tablet TAKE 1 TABLET EVERY DAY 90 tablet 3   vitamin E 100 UNIT capsule Take 100 Units by mouth daily.     No current facility-administered medications for this visit.    SURGICAL HISTORY:  Past Surgical History:  Procedure Laterality Date   CATARACT EXTRACTION, BILATERAL Bilateral    DILATION AND CURETTAGE OF UTERUS      REVIEW OF SYSTEMS:  A comprehensive review of systems was negative except for: Constitutional: positive for fatigue Gastrointestinal: positive for diarrhea   PHYSICAL EXAMINATION: General appearance: alert, cooperative, and no distress Head: Normocephalic, without obvious abnormality, atraumatic Neck: no adenopathy, no JVD, supple, symmetrical, trachea midline, and thyroid not enlarged, symmetric, no tenderness/mass/nodules Lymph nodes: Cervical, supraclavicular, and axillary nodes normal. Resp: clear to auscultation bilaterally Back: symmetric, no curvature. ROM normal. No CVA tenderness. Cardio: regular rate and rhythm, S1, S2 normal, no murmur, click, rub or gallop GI: soft, non-tender; bowel sounds normal; no masses,  no organomegaly Extremities: edema trace edema bilateral  ECOG PERFORMANCE STATUS: 1 - Symptomatic but completely ambulatory  Blood pressure (!) 124/52, pulse (!) 50, temperature 98 F (36.7 C), resp. rate 16, height 5\' 5"  (1.651 m), weight 191 lb 9.6 oz (86.9 kg), SpO2 100%.  LABORATORY DATA: Lab Results  Component Value Date   WBC 3.4 (L) 08/03/2023   HGB 10.0 (L) 08/03/2023   HCT 30.4 (L) 08/03/2023   MCV 90.5 08/03/2023   PLT 123 (L) 08/03/2023      Chemistry      Component Value Date/Time   NA 142 08/03/2023 1101   NA 145 (H) 12/28/2018 1436   K 3.7  08/03/2023 1101   CL 105 08/03/2023 1101   CO2 32 08/03/2023 1101   BUN 16 08/03/2023 1101   BUN 15 12/28/2018 1436   CREATININE 1.22 (H) 08/03/2023 1101   CREATININE 1.42 (H) 07/22/2023 0956      Component Value Date/Time   CALCIUM 8.6 (L) 08/03/2023 1101   ALKPHOS 47 08/03/2023 1101   AST 22 08/03/2023 1101   ALT 20 08/03/2023 1101   BILITOT 1.3 (H) 08/03/2023 1101       RADIOGRAPHIC STUDIES: No results found.   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  The patient had extensive work-up for multiple myeloma including a bone marrow biopsy and aspirate that showed only 6% plasma cells.  Her protein studies still suspicious for underlying MGUS/multiple myeloma but the findings are not enough to call it multiple myeloma at this point. The patient underwent radiotherapy to the plasmacytoma of the proximal right humerus and  tolerated the procedure fairly well.  She is currently on observation.  The skeletal bone survey showed no other lytic bone lesion except the proximal right humerus. The patient was treated with palliative radiotherapy in the past but recent skeletal bone survey showed progression of the lytic lesion in the proximal humeral diaphysis with pathologic fracture.  She underwent surgical resection of the tumor with reconstruction under the care of Dr. Jana Half at [facility] on May 01, 2021.   The patient was found on recent protein study to have elevated IgG level.  She underwent a bone marrow biopsy and aspirate that showed 10-20% plasma cells. She had skeletal bone survey performed recently that showed new lytic lesions in the proximal left femoral shaft and midshaft of the right femur consistent with myelomatous involvement or other lytic metastasis.  There was also scattered areas of endosteal scalloping in the left humeral shaft concerning for  myelomatous involvement. The patient started systemic treatment with subcutaneous daratumumab, subcutaneous Velcade, Revlimid and Decadron every 3 weeks cycle on May 27, 2023.  She status post 3 cycles and tolerated the first cycle of her treatment fairly well. We will also consider the patient for treatment with Zometa infusion after dental clearance.    Multiple Myeloma Diagnosed in November 2024. Currently undergoing systemic treatment with subcutaneous daratumumab, subcutaneous Velcade weekly, Revlimid, and Decadron every three weeks. Post three cycles of treatment and preparing for the fourth cycle. Labs are adequate for treatment continuation. Plan to evaluate response to four cycles of treatment with a full myeloma panel. - Continue current regimen with daratumumab, Velcade, Revlimid, and Decadron - Order full myeloma panel in two weeks to evaluate treatment response - Discuss lab results in three weeks during next treatment visit  Diarrhea Experiences diarrhea post-treatment, lasting 24-48 hours, approximately eight times a day. No nausea or vomiting. Currently using Imodium for management. - Instruct to take Imodium after each episode of diarrhea  Peripheral Edema Mild swelling in feet and ankles. No dietary salt intake due to husband's congestive heart failure. Advised to elevate legs and reduce salt intake if applicable. - Advise leg elevation at the end of the day - Continue low-salt diet   The patient was advised to call immediately if she has any concerning symptoms in the interval. The patient voices understanding of current disease status and treatment options and is in agreement with the current care plan.  All questions were answered. The patient knows to call the clinic with any problems, questions or concerns. We can certainly see the patient much sooner if necessary.  The total time spent in the appointment was 20 minutes.  Disclaimer: This note was dictated with  voice recognition software. Similar sounding words can inadvertently be transcribed and may not be corrected upon review.

## 2023-08-03 NOTE — Telephone Encounter (Signed)
 Requested dental clearance via email.

## 2023-08-03 NOTE — Telephone Encounter (Signed)
 Called dentist again. The office assistant will fax dental clearance letter to me , not HIM.

## 2023-08-06 ENCOUNTER — Other Ambulatory Visit: Payer: Self-pay | Admitting: Medical Oncology

## 2023-08-06 DIAGNOSIS — C9000 Multiple myeloma not having achieved remission: Secondary | ICD-10-CM

## 2023-08-06 MED ORDER — LENALIDOMIDE 25 MG PO CAPS: 25.0000 mg | ORAL_CAPSULE | Freq: Every day | ORAL | 0 refills | Status: DC

## 2023-08-10 ENCOUNTER — Inpatient Hospital Stay: Payer: Medicare HMO

## 2023-08-10 ENCOUNTER — Telehealth: Payer: Self-pay | Admitting: Medical Oncology

## 2023-08-10 ENCOUNTER — Ambulatory Visit: Payer: Medicare HMO | Admitting: Internal Medicine

## 2023-08-10 ENCOUNTER — Other Ambulatory Visit: Payer: Medicare HMO

## 2023-08-10 VITALS — BP 138/63 | HR 54 | Temp 98.3°F | Resp 17 | Wt 191.2 lb

## 2023-08-10 DIAGNOSIS — Z5112 Encounter for antineoplastic immunotherapy: Secondary | ICD-10-CM | POA: Diagnosis not present

## 2023-08-10 DIAGNOSIS — E876 Hypokalemia: Secondary | ICD-10-CM | POA: Diagnosis not present

## 2023-08-10 DIAGNOSIS — M25472 Effusion, left ankle: Secondary | ICD-10-CM | POA: Diagnosis not present

## 2023-08-10 DIAGNOSIS — R5383 Other fatigue: Secondary | ICD-10-CM | POA: Diagnosis not present

## 2023-08-10 DIAGNOSIS — C9000 Multiple myeloma not having achieved remission: Secondary | ICD-10-CM

## 2023-08-10 DIAGNOSIS — R197 Diarrhea, unspecified: Secondary | ICD-10-CM | POA: Diagnosis not present

## 2023-08-10 DIAGNOSIS — R682 Dry mouth, unspecified: Secondary | ICD-10-CM | POA: Diagnosis not present

## 2023-08-10 DIAGNOSIS — G893 Neoplasm related pain (acute) (chronic): Secondary | ICD-10-CM | POA: Diagnosis not present

## 2023-08-10 DIAGNOSIS — M25471 Effusion, right ankle: Secondary | ICD-10-CM | POA: Diagnosis not present

## 2023-08-10 LAB — CBC WITH DIFFERENTIAL (CANCER CENTER ONLY)
Abs Immature Granulocytes: 0.05 10*3/uL (ref 0.00–0.07)
Basophils Absolute: 0 10*3/uL (ref 0.0–0.1)
Basophils Relative: 0 %
Eosinophils Absolute: 0.2 10*3/uL (ref 0.0–0.5)
Eosinophils Relative: 5 %
HCT: 28.9 % — ABNORMAL LOW (ref 36.0–46.0)
Hemoglobin: 9.7 g/dL — ABNORMAL LOW (ref 12.0–15.0)
Immature Granulocytes: 1 %
Lymphocytes Relative: 20 %
Lymphs Abs: 0.7 10*3/uL (ref 0.7–4.0)
MCH: 30.2 pg (ref 26.0–34.0)
MCHC: 33.6 g/dL (ref 30.0–36.0)
MCV: 90 fL (ref 80.0–100.0)
Monocytes Absolute: 0.8 10*3/uL (ref 0.1–1.0)
Monocytes Relative: 23 %
Neutro Abs: 1.8 10*3/uL (ref 1.7–7.7)
Neutrophils Relative %: 51 %
Platelet Count: 114 10*3/uL — ABNORMAL LOW (ref 150–400)
RBC: 3.21 MIL/uL — ABNORMAL LOW (ref 3.87–5.11)
RDW: 14.9 % (ref 11.5–15.5)
WBC Count: 3.6 10*3/uL — ABNORMAL LOW (ref 4.0–10.5)
nRBC: 0 % (ref 0.0–0.2)

## 2023-08-10 LAB — CMP (CANCER CENTER ONLY)
ALT: 16 U/L (ref 0–44)
AST: 15 U/L (ref 15–41)
Albumin: 3.5 g/dL (ref 3.5–5.0)
Alkaline Phosphatase: 47 U/L (ref 38–126)
Anion gap: 3 — ABNORMAL LOW (ref 5–15)
BUN: 25 mg/dL — ABNORMAL HIGH (ref 8–23)
CO2: 33 mmol/L — ABNORMAL HIGH (ref 22–32)
Calcium: 8.4 mg/dL — ABNORMAL LOW (ref 8.9–10.3)
Chloride: 107 mmol/L (ref 98–111)
Creatinine: 1.15 mg/dL — ABNORMAL HIGH (ref 0.44–1.00)
GFR, Estimated: 51 mL/min — ABNORMAL LOW (ref >60)
Glucose, Bld: 90 mg/dL (ref 70–99)
Potassium: 3.5 mmol/L (ref 3.5–5.1)
Sodium: 143 mmol/L (ref 135–145)
Total Bilirubin: 1.2 mg/dL (ref 0.0–1.2)
Total Protein: 5.4 g/dL — ABNORMAL LOW (ref 6.5–8.1)

## 2023-08-10 MED ORDER — DARATUMUMAB-HYALURONIDASE-FIHJ 1800-30000 MG-UT/15ML ~~LOC~~ SOLN
1800.0000 mg | Freq: Once | SUBCUTANEOUS | Status: AC
  Administered 2023-08-10: 1800 mg via SUBCUTANEOUS
  Filled 2023-08-10: qty 15

## 2023-08-10 MED ORDER — DIPHENHYDRAMINE HCL 25 MG PO CAPS
50.0000 mg | ORAL_CAPSULE | Freq: Once | ORAL | Status: AC
  Administered 2023-08-10: 50 mg via ORAL
  Filled 2023-08-10: qty 2

## 2023-08-10 MED ORDER — DEXAMETHASONE 4 MG PO TABS
20.0000 mg | ORAL_TABLET | Freq: Once | ORAL | Status: AC
  Administered 2023-08-10: 20 mg via ORAL
  Filled 2023-08-10: qty 5

## 2023-08-10 MED ORDER — BORTEZOMIB CHEMO SQ INJECTION 3.5 MG (2.5MG/ML)
1.3000 mg/m2 | Freq: Once | INTRAMUSCULAR | Status: AC
  Administered 2023-08-10: 2.75 mg via SUBCUTANEOUS
  Filled 2023-08-10: qty 1.1

## 2023-08-10 MED ORDER — ACETAMINOPHEN 325 MG PO TABS
650.0000 mg | ORAL_TABLET | Freq: Once | ORAL | Status: AC
  Administered 2023-08-10: 650 mg via ORAL
  Filled 2023-08-10: qty 2

## 2023-08-10 NOTE — Progress Notes (Signed)
 Patient did not take steroid at home today and will need to take in the clinic this morning.

## 2023-08-10 NOTE — Telephone Encounter (Signed)
 err

## 2023-08-10 NOTE — Patient Instructions (Signed)
 [facility]  Discharge Instructions: Thank you for choosing [facility] to provide your oncology and hematology care.   If you have a lab appointment with the Cancer Center, please go directly to the Cancer Center and check in at the registration area.   Wear comfortable clothing and clothing appropriate for easy access to any Portacath or PICC line.   We strive to give you quality time with your provider. You may need to reschedule your appointment if you arrive late (15 or more minutes).  Arriving late affects you and other patients whose appointments are after yours.  Also, if you miss three or more appointments without notifying the office, you may be dismissed from the clinic at the provider's discretion.      For prescription refill requests, have your pharmacy contact our office and allow 72 hours for refills to be completed.    Today you received the following chemotherapy and/or immunotherapy agents: Velcade, Dara Faspro.       To help prevent nausea and vomiting after your treatment, we encourage you to take your nausea medication as directed.  BELOW ARE SYMPTOMS THAT SHOULD BE REPORTED IMMEDIATELY: *FEVER GREATER THAN 100.4 F (38 C) OR HIGHER *CHILLS OR SWEATING *NAUSEA AND VOMITING THAT IS NOT CONTROLLED WITH YOUR NAUSEA MEDICATION *UNUSUAL SHORTNESS OF BREATH *UNUSUAL BRUISING OR BLEEDING *URINARY PROBLEMS (pain or burning when urinating, or frequent urination) *BOWEL PROBLEMS (unusual diarrhea, constipation, pain near the anus) TENDERNESS IN MOUTH AND THROAT WITH OR WITHOUT PRESENCE OF ULCERS (sore throat, sores in mouth, or a toothache) UNUSUAL RASH, SWELLING OR PAIN  UNUSUAL VAGINAL DISCHARGE OR ITCHING   Items with * indicate a potential emergency and should be followed up as soon as possible or go to the Emergency Department if any problems should occur.  Please show the CHEMOTHERAPY ALERT CARD or  IMMUNOTHERAPY ALERT CARD at check-in to the Emergency Department and triage nurse.  Should you have questions after your visit or need to cancel or reschedule your appointment, please contact [facility]  Dept: [phone]  and follow the prompts.  Office hours are 8:00 a.m. to 4:30 p.m. Monday - Friday. Please note that voicemails left after 4:00 p.m. may not be returned until the following business day.  We are closed weekends and major holidays. You have access to a nurse at all times for urgent questions. Please call the main number to the clinic Dept: [phone] and follow the prompts.   For any non-urgent questions, you may also contact your provider using MyChart. We now offer e-Visits for anyone 75 and older to request care online for non-urgent symptoms. For details visit [email or web address].   Also download the MyChart app! Go to the app store, search "MyChart", open the app, select , and log in with your MyChart username and password.

## 2023-08-12 ENCOUNTER — Telehealth: Payer: Self-pay | Admitting: Medical Oncology

## 2023-08-12 NOTE — Telephone Encounter (Signed)
 Joanna Ray  needs to r/s dental appt with [facility] to see if she can be cleared to receive Zometa. The dentist needs to redo her exam.

## 2023-08-16 ENCOUNTER — Telehealth: Payer: Self-pay | Admitting: Medical Oncology

## 2023-08-16 NOTE — Telephone Encounter (Signed)
 Received fax that Joanna Ray  is not cleared  for zometa.  Joanna Ray needs a crown on a tooth. This procedure is scheduled for may 7,2025. She cannot receive zometa until we receive confirmation from Dentist for dental clearance . They will send a fax after the procedure.

## 2023-08-17 ENCOUNTER — Inpatient Hospital Stay: Payer: Medicare HMO

## 2023-08-17 ENCOUNTER — Ambulatory Visit: Payer: Medicare HMO | Admitting: Internal Medicine

## 2023-08-17 ENCOUNTER — Inpatient Hospital Stay

## 2023-08-17 ENCOUNTER — Other Ambulatory Visit: Payer: Medicare HMO

## 2023-08-17 VITALS — BP 132/67 | HR 63 | Temp 97.6°F | Resp 18 | Wt 191.0 lb

## 2023-08-17 DIAGNOSIS — M25472 Effusion, left ankle: Secondary | ICD-10-CM | POA: Diagnosis not present

## 2023-08-17 DIAGNOSIS — R682 Dry mouth, unspecified: Secondary | ICD-10-CM | POA: Diagnosis not present

## 2023-08-17 DIAGNOSIS — M25471 Effusion, right ankle: Secondary | ICD-10-CM | POA: Diagnosis not present

## 2023-08-17 DIAGNOSIS — R5383 Other fatigue: Secondary | ICD-10-CM | POA: Diagnosis not present

## 2023-08-17 DIAGNOSIS — E876 Hypokalemia: Secondary | ICD-10-CM | POA: Diagnosis not present

## 2023-08-17 DIAGNOSIS — R197 Diarrhea, unspecified: Secondary | ICD-10-CM | POA: Diagnosis not present

## 2023-08-17 DIAGNOSIS — C9000 Multiple myeloma not having achieved remission: Secondary | ICD-10-CM | POA: Diagnosis not present

## 2023-08-17 DIAGNOSIS — Z5112 Encounter for antineoplastic immunotherapy: Secondary | ICD-10-CM | POA: Diagnosis not present

## 2023-08-17 DIAGNOSIS — G893 Neoplasm related pain (acute) (chronic): Secondary | ICD-10-CM | POA: Diagnosis not present

## 2023-08-17 LAB — CBC WITH DIFFERENTIAL (CANCER CENTER ONLY)
Abs Immature Granulocytes: 0.36 10*3/uL — ABNORMAL HIGH (ref 0.00–0.07)
Basophils Absolute: 0 10*3/uL (ref 0.0–0.1)
Basophils Relative: 1 %
Eosinophils Absolute: 0.2 10*3/uL (ref 0.0–0.5)
Eosinophils Relative: 5 %
HCT: 28.3 % — ABNORMAL LOW (ref 36.0–46.0)
Hemoglobin: 9.5 g/dL — ABNORMAL LOW (ref 12.0–15.0)
Immature Granulocytes: 11 %
Lymphocytes Relative: 14 %
Lymphs Abs: 0.5 10*3/uL — ABNORMAL LOW (ref 0.7–4.0)
MCH: 30.4 pg (ref 26.0–34.0)
MCHC: 33.6 g/dL (ref 30.0–36.0)
MCV: 90.7 fL (ref 80.0–100.0)
Monocytes Absolute: 0.2 10*3/uL (ref 0.1–1.0)
Monocytes Relative: 7 %
Neutro Abs: 2.2 10*3/uL (ref 1.7–7.7)
Neutrophils Relative %: 62 %
Platelet Count: 135 10*3/uL — ABNORMAL LOW (ref 150–400)
RBC: 3.12 MIL/uL — ABNORMAL LOW (ref 3.87–5.11)
RDW: 15.5 % (ref 11.5–15.5)
WBC Count: 3.4 10*3/uL — ABNORMAL LOW (ref 4.0–10.5)
nRBC: 0 % (ref 0.0–0.2)

## 2023-08-17 LAB — CMP (CANCER CENTER ONLY)
ALT: 17 U/L (ref 0–44)
AST: 14 U/L — ABNORMAL LOW (ref 15–41)
Albumin: 3.5 g/dL (ref 3.5–5.0)
Alkaline Phosphatase: 45 U/L (ref 38–126)
Anion gap: 4 — ABNORMAL LOW (ref 5–15)
BUN: 15 mg/dL (ref 8–23)
CO2: 34 mmol/L — ABNORMAL HIGH (ref 22–32)
Calcium: 8.3 mg/dL — ABNORMAL LOW (ref 8.9–10.3)
Chloride: 105 mmol/L (ref 98–111)
Creatinine: 1.04 mg/dL — ABNORMAL HIGH (ref 0.44–1.00)
GFR, Estimated: 57 mL/min — ABNORMAL LOW (ref >60)
Glucose, Bld: 93 mg/dL (ref 70–99)
Potassium: 3.3 mmol/L — ABNORMAL LOW (ref 3.5–5.1)
Sodium: 143 mmol/L (ref 135–145)
Total Bilirubin: 1.3 mg/dL — ABNORMAL HIGH (ref 0.0–1.2)
Total Protein: 5.4 g/dL — ABNORMAL LOW (ref 6.5–8.1)

## 2023-08-17 MED ORDER — ACETAMINOPHEN 325 MG PO TABS
650.0000 mg | ORAL_TABLET | Freq: Once | ORAL | Status: AC
  Administered 2023-08-17: 650 mg via ORAL
  Filled 2023-08-17: qty 2

## 2023-08-17 MED ORDER — DIPHENHYDRAMINE HCL 25 MG PO CAPS
50.0000 mg | ORAL_CAPSULE | Freq: Once | ORAL | Status: AC
  Administered 2023-08-17: 50 mg via ORAL
  Filled 2023-08-17: qty 2

## 2023-08-17 MED ORDER — DEXAMETHASONE 4 MG PO TABS
20.0000 mg | ORAL_TABLET | Freq: Once | ORAL | Status: AC
  Administered 2023-08-17: 20 mg via ORAL
  Filled 2023-08-17: qty 5

## 2023-08-17 MED ORDER — DARATUMUMAB-HYALURONIDASE-FIHJ 1800-30000 MG-UT/15ML ~~LOC~~ SOLN
1800.0000 mg | Freq: Once | SUBCUTANEOUS | Status: AC
  Administered 2023-08-17: 1800 mg via SUBCUTANEOUS
  Filled 2023-08-17: qty 15

## 2023-08-17 MED ORDER — BORTEZOMIB CHEMO SQ INJECTION 3.5 MG (2.5MG/ML)
1.3000 mg/m2 | Freq: Once | INTRAMUSCULAR | Status: AC
  Administered 2023-08-17: 2.75 mg via SUBCUTANEOUS
  Filled 2023-08-17: qty 1.1

## 2023-08-17 NOTE — Patient Instructions (Signed)
 [facility]  Discharge Instructions: Thank you for choosing [facility] to provide your oncology and hematology care.   If you have a lab appointment with the Cancer Center, please go directly to the Cancer Center and check in at the registration area.   Wear comfortable clothing and clothing appropriate for easy access to any Portacath or PICC line.   We strive to give you quality time with your provider. You may need to reschedule your appointment if you arrive late (15 or more minutes).  Arriving late affects you and other patients whose appointments are after yours.  Also, if you miss three or more appointments without notifying the office, you may be dismissed from the clinic at the provider's discretion.      For prescription refill requests, have your pharmacy contact our office and allow 72 hours for refills to be completed.    Today you received the following chemotherapy and/or immunotherapy agents: Velcade, Dara Faspro.       To help prevent nausea and vomiting after your treatment, we encourage you to take your nausea medication as directed.  BELOW ARE SYMPTOMS THAT SHOULD BE REPORTED IMMEDIATELY: *FEVER GREATER THAN 100.4 F (38 C) OR HIGHER *CHILLS OR SWEATING *NAUSEA AND VOMITING THAT IS NOT CONTROLLED WITH YOUR NAUSEA MEDICATION *UNUSUAL SHORTNESS OF BREATH *UNUSUAL BRUISING OR BLEEDING *URINARY PROBLEMS (pain or burning when urinating, or frequent urination) *BOWEL PROBLEMS (unusual diarrhea, constipation, pain near the anus) TENDERNESS IN MOUTH AND THROAT WITH OR WITHOUT PRESENCE OF ULCERS (sore throat, sores in mouth, or a toothache) UNUSUAL RASH, SWELLING OR PAIN  UNUSUAL VAGINAL DISCHARGE OR ITCHING   Items with * indicate a potential emergency and should be followed up as soon as possible or go to the Emergency Department if any problems should occur.  Please show the CHEMOTHERAPY ALERT CARD or  IMMUNOTHERAPY ALERT CARD at check-in to the Emergency Department and triage nurse.  Should you have questions after your visit or need to cancel or reschedule your appointment, please contact [facility]  Dept: [phone]  and follow the prompts.  Office hours are 8:00 a.m. to 4:30 p.m. Monday - Friday. Please note that voicemails left after 4:00 p.m. may not be returned until the following business day.  We are closed weekends and major holidays. You have access to a nurse at all times for urgent questions. Please call the main number to the clinic Dept: [phone] and follow the prompts.   For any non-urgent questions, you may also contact your provider using MyChart. We now offer e-Visits for anyone 75 and older to request care online for non-urgent symptoms. For details visit [email or web address].   Also download the MyChart app! Go to the app store, search "MyChart", open the app, select , and log in with your MyChart username and password.

## 2023-08-18 ENCOUNTER — Encounter: Payer: Self-pay | Admitting: Internal Medicine

## 2023-08-18 LAB — KAPPA/LAMBDA LIGHT CHAINS
Kappa free light chain: 12.1 mg/L (ref 3.3–19.4)
Kappa, lambda light chain ratio: 2.75 — ABNORMAL HIGH (ref 0.26–1.65)
Lambda free light chains: 4.4 mg/L — ABNORMAL LOW (ref 5.7–26.3)

## 2023-08-18 LAB — BETA 2 MICROGLOBULIN, SERUM: Beta-2 Microglobulin: 2.6 mg/L — ABNORMAL HIGH (ref 0.6–2.4)

## 2023-08-19 ENCOUNTER — Other Ambulatory Visit: Payer: Self-pay | Admitting: Medical Oncology

## 2023-08-19 DIAGNOSIS — C9000 Multiple myeloma not having achieved remission: Secondary | ICD-10-CM

## 2023-08-19 LAB — IGG, IGA, IGM
IgA: 25 mg/dL — ABNORMAL LOW (ref 64–422)
IgG (Immunoglobin G), Serum: 493 mg/dL — ABNORMAL LOW (ref 586–1602)
IgM (Immunoglobulin M), Srm: 21 mg/dL — ABNORMAL LOW (ref 26–217)

## 2023-08-19 MED ORDER — LENALIDOMIDE 25 MG PO CAPS: 25.0000 mg | ORAL_CAPSULE | Freq: Every day | ORAL | 0 refills | Status: DC

## 2023-08-19 NOTE — Telephone Encounter (Signed)
Revlimid refilled.

## 2023-08-22 NOTE — Progress Notes (Unsigned)
 [facility] OFFICE PROGRESS NOTE  Sylvan Evener, MD [address] [place] [address]  DIAGNOSIS: Multiple myeloma IgG subtype initially diagnosed as plasmacytoma of the proximal right humerus diagnosed in February 2022 with full-blown multiple myeloma in November 2024.   PRIOR THERAPY: 1) Palliative radiotherapy to the plasmacytoma of the proximal right humerus under the care of Dr. Eloise Hake. 2) Status post tumor resection from the proximal humerus with reconstruction under the care of Dr. Read Camel at [facility] on May 01, 2021.  CURRENT THERAPY: Systemic treatment with subcutaneous daratumumab , subcutaneous Velcade  on weekly basis, Revlimid  25 mg p.o. for 14 days every 3 weeks in addition to Decadron  20 mg p.o. weekly during the course of chemotherapy.  First dose May 27, 2023.  Status post 4 cycles.   INTERVAL HISTORY: Joanna Ray 73 y.o. Ray returns to the clinic today for a follow-up visit accompanied by her husband.  She was last seen in the clinic on 08/03/23.  The patient is currently being treated for multiple myeloma.  She is status post 4 cycles.  Overall she tolerated the last cycle of treatment fairly well. She does have some stable fatigue  Today she denies any fever, chills, or night sweats. She has been experiencing taste alterations for which she has used Biotene, baking soda, and salt water rinses. She lost about 6 lbs. She uses dry mouth spray and lemon pads.  For cancer related pain she takes Norco due to back pain secondary to the metastatic bone lesions. The medication provides relief, and she requires a refill. No new locations of bone pain Overall her bone pain is similar in hips.  The patient had a dental exam and she was not cleared for Zometa. She denies any signs and symptoms of infection including sore throat, nasal congestion, cough, shortness of breath, skin infections, or dysuria.  She denies diarrhea.  She denies any  nausea or vomiting.  She denies any abnormal bleeding or bruising. At her last appointment she was endorsing lower extremity swelling which is improved at this time. She is here today for evaluation and repeat blood work before undergoing cycle #5.    MEDICAL HISTORY: Past Medical History:  Diagnosis Date   Anxiety    Arthritis    Cancer (HCC) 2021   multiple myloma Right Arm   Cataract    GERD (gastroesophageal reflux disease)    Hyperlipidemia    Hypertension    Urticaria     ALLERGIES:  is allergic to corn-containing products and shellfish allergy.  MEDICATIONS:  Current Outpatient Medications  Medication Sig Dispense Refill   acyclovir  (ZOVIRAX ) 400 MG tablet Take 1 tablet (400 mg total) by mouth 2 (two) times daily. 60 tablet 5   ALPRAZolam  (XANAX ) 0.5 MG tablet Take 1 tablet by mouth twice daily as needed for anxiety 60 tablet 5   apixaban  (ELIQUIS ) 2.5 MG TABS tablet Take 1 tablet (2.5 mg total) by mouth 2 (two) times daily. 60 tablet 2   aspirin  81 MG tablet Take 81 mg by mouth daily.     cholecalciferol  (VITAMIN D ) 1000 UNITS tablet Take 1,000 Units by mouth daily.     cyclobenzaprine  (FLEXERIL ) 10 MG tablet Take 1 tablet by mouth three times daily as needed for muscle spasm 90 tablet 0   dexamethasone  (DECADRON ) 4 MG tablet Take 5 tabs (20 mg) weekly the day after daratumumab  for 12 weeks. Take with breakfast. 20 tablet 5   EPINEPHrine  (EPIPEN  2-PAK) 0.3 mg/0.3  mL IJ SOAJ injection Inject 0.3 mg into the muscle as needed for anaphylaxis. 1 each PRN   fexofenadine  (ALLEGRA ) 180 MG tablet Take 1 tablet (180 mg total) by mouth daily. 90 tablet 1   hydrocortisone valerate cream (WESTCORT) 0.2 % 1 app     lenalidomide  (REVLIMID ) 25 MG capsule Take 1 capsule (25 mg total) by mouth daily. [employer] Auth #   [id number]   Date Obtained 08/19/2023 14 capsule 0   meclizine  (ANTIVERT ) 25 MG tablet Take 1 tablet (25 mg total) by mouth 3 (three) times daily as needed for dizziness. 30 tablet  0   ondansetron  (ZOFRAN ) 8 MG tablet Take 1 tablet (8 mg total) by mouth every 8 (eight) hours as needed for nausea or vomiting. 30 tablet 1   potassium chloride  SA (KLOR-CON  M) 20 MEQ tablet Take 1 tablet (20 mEq total) by mouth 2 (two) times daily. 16 tablet 0   prochlorperazine  (COMPAZINE ) 10 MG tablet TAKE 1 TABLET BY MOUTH EVERY 6 HOURS AS NEEDED FOR NAUSEA FOR VOMITING 30 tablet 0   rosuvastatin  (CRESTOR ) 20 MG tablet TAKE 1/2 TABLET ONE TIME DAILY 45 tablet 3   valsartan -hydrochlorothiazide  (DIOVAN -HCT) 160-25 MG tablet TAKE 1 TABLET EVERY DAY 90 tablet 3   vitamin E  100 UNIT capsule Take 100 Units by mouth daily.     HYDROcodone -acetaminophen  (NORCO/VICODIN) 5-325 MG tablet Take 1 tablet by mouth every 6 (six) hours as needed for moderate pain (pain score 4-6). 30 tablet 0   No current facility-administered medications for this visit.    SURGICAL HISTORY:  Past Surgical History:  Procedure Laterality Date   CATARACT EXTRACTION, BILATERAL Bilateral    DILATION AND CURETTAGE OF UTERUS      REVIEW OF SYSTEMS:   Constitutional: Stable fatigue. Positive for weight loss. Negative for appetite change, chills, fatigue, and fever.  HENT: Positive for taste alterations. Negative for mouth sores, nosebleeds, sore throat and trouble swallowing.   Eyes: Negative for eye problems and icterus.  Respiratory: Negative for cough, hemoptysis, shortness of breath and wheezing.   Cardiovascular: Negative for chest pain and leg swelling.  Gastrointestinal: Negative for abdominal pain, constipation, diarrhea, nausea and vomiting.  Genitourinary: Negative for bladder incontinence, difficulty urinating, dysuria, frequency and hematuria.   Musculoskeletal: Positive for stable hip.. Negative for gait problem, neck pain and neck stiffness.  Skin: Negative for itching and rash.  Neurological: Negative for dizziness, extremity weakness, gait problem, headaches, light-headedness and seizures.  Hematological:  Negative for adenopathy. Does not bruise/bleed easily.  Psychiatric/Behavioral: Negative for confusion, depression and sleep disturbance. The patient is not nervous/anxious.     PHYSICAL EXAMINATION:  Blood pressure 134/66, pulse 61, temperature (!) 97.5 F (36.4 C), temperature source Temporal, resp. rate 14, weight 185 lb 12.8 oz (84.3 kg), SpO2 100%.  ECOG PERFORMANCE STATUS: 1  Physical Exam  Constitutional: Oriented to person, place, and time and well-developed, well-nourished, and in no distress.  HENT:  Head: Normocephalic and atraumatic.  Mouth/Throat: Oropharynx is clear and moist. No oropharyngeal exudate.  Eyes: Conjunctivae are normal. Right eye exhibits no discharge. Left eye exhibits no discharge. No scleral icterus.  Neck: Normal range of motion. Neck supple.  Cardiovascular: Normal rate, regular rhythm, normal heart sounds and intact distal pulses.   Pulmonary/Chest: Effort normal and breath sounds normal. No respiratory distress. No wheezes. No rales.  Abdominal: Soft. Bowel sounds are normal. Exhibits no distension and no mass. There is no tenderness.  Musculoskeletal: Normal range of motion. Exhibits no edema.  Lymphadenopathy:    No cervical adenopathy.  Neurological: Alert and oriented to person, place, and time. Exhibits normal muscle tone. Gait normal. Coordination normal.  Skin: Skin is warm and dry. No rash noted. Not diaphoretic. No erythema. No pallor.  Psychiatric: Mood, memory and judgment normal.  Vitals reviewed.    LABORATORY DATA: Lab Results  Component Value Date   WBC 3.9 (L) 08/24/2023   HGB 9.4 (L) 08/24/2023   HCT 27.9 (L) 08/24/2023   MCV 89.7 08/24/2023   PLT 135 (L) 08/24/2023      Chemistry      Component Value Date/Time   NA 143 08/24/2023 0913   NA 145 (H) 12/28/2018 1436   K 2.8 (L) 08/24/2023 0913   CL 107 08/24/2023 0913   CO2 30 08/24/2023 0913   BUN 22 08/24/2023 0913   BUN 15 12/28/2018 1436   CREATININE 1.25 (H)  08/24/2023 0913   CREATININE 1.42 (H) 07/22/2023 0956      Component Value Date/Time   CALCIUM  8.0 (L) 08/24/2023 0913   ALKPHOS 51 08/24/2023 0913   AST 19 08/24/2023 0913   ALT 23 08/24/2023 0913   BILITOT 1.2 08/24/2023 0913       RADIOGRAPHIC STUDIES:  No results found.   ASSESSMENT/PLAN:  This is a very pleasant 73 year old African-American Ray with plasmacytoma of the proximal right humerus diagnosed in February 2022.  The patient had extensive work-up for multiple myeloma including a bone marrow biopsy and aspirate that showed only 6% plasma cells.  Her protein studies were still suspicious for underlying MGUS/multiple myeloma but the findings are not enough to call it multiple myeloma at this point.   The patient underwent radiotherapy to the plasmacytoma of the proximal right humerus. The skeletal bone survey at that time showed no other lytic bone lesion except the proximal right humerus.   Recent skeletal bone survey showed progression of the lytic lesion in the proximal humeral diaphysis with pathologic fracture.  She underwent surgical resection of the tumor with reconstruction under the care of Dr. Read Camel at [facility] on May 01, 2021.     The patient was found on recent protein study to have elevated IgG level.  She underwent a bone marrow biopsy and aspirate that showed 10-20% plasma cells. She had skeletal bone survey performed recently that showed new lytic lesions in the proximal left femoral shaft and midshaft of the right femur consistent with myelomatous involvement or other lytic metastasis.  There was also scattered areas of endosteal scalloping in the left humeral shaft concerning for myelomatous involvement. This was diagnosed in November 2024.    Therefore, Dr. Marguerita Shih recommended treatemnt with revlimid  25 mg 14 days on/7 days off, daratumumab , and velcade  subcutaneously weekly with decadron  20 mg weekly. The plan is to undergo 4-6 rounds then  assessing for potential for if she is a candidate for autologous stem cell transplant    She had repeat myeloma labs performed. The patient was seen with Dr. Marguerita Shih. Labs were reviewed.  This shows improvement in her myeloma panel.  Mohamed recommends referral to autologous stem cell transplant team.  The patient prefers to go to [facility].  Will place a referral and my nurse will fax the referral and reach out to their office.  The patient know to call us  if she does not hear from them in a week at least to schedule an appointment.  Of note, the patient's dentist did not sign off on dental clearance  due to standing dental work.  Bone pain in hip Bone pain managed with Norco, providing effective relief. - Prescribe Norco for pain management and send prescription to [facility].  Hypokalemia - Prescribe potassium supplements twice daily for one week. - Advise on option to crush potassium pills and mix with applesauce if swallowing is difficult.  She will continue taking eliquis  for prophylaxis.   She will proceed with cycle #5 today as scheduled.  Dr. Marguerita Shih recommends keeping her appointments as scheduled until we hear back from the transplant team for a timeline of potential transplant.   She will continue to take acylovir for viral prophylaxis.     The patient was advised to call immediately if she has any concerning symptoms in the interval. The patient voices understanding of current disease status and treatment options and is in agreement with the current care plan. All questions were answered. The patient knows to call the clinic with any problems, questions or concerns. We can certainly see the patient much sooner if necessary        Orders Placed This Encounter  Procedures   Ambulatory referral to Hematology / Oncology    Referral Priority:   Urgent    Referral Type:   Consultation    Referral Reason:   Specialty Services Required     Requested Specialty:   Oncology    Number of Visits Requested:   1     Dodi Leu L Leigh Blas, PA-C 08/24/23  ADDENDUM: Hematology/Oncology Attending:  I had a face-to-face encounter with the patient today.  I reviewed her records, lab and recommended her care plan.  This is a very pleasant 73 years old African-American Ray diagnosed with multiple myeloma, IgG subtype in November 2024 after initial presentation February 2022 with plasmacytoma of the right humerus.  She is currently undergoing systemic treatment with subcutaneous daratumumab , subcutaneous Velcade  on weekly basis in addition to Revlimid  and Decadron  status post 4 cycles.  She has been tolerating her treatment fairly well with no significant adverse effects.  The patient has some taste alteration and she has been using Biotene and baking soda.  She lost around 6 pounds. She had repeat myeloma panel performed recently.  I personally discussed the lab results with the patient and showed significant improvement in her disease. I recommended for the patient to proceed with cycle #5 today but we will refer her to [facility] to see if the patient will be a good candidate for autologous stem cell transplant.  We will see her back for follow-up visit before the next cycle of her treatment but we will try to stop her treatment cycle #6 to avoid any further damage to her bone marrow and to be able to collect enough stem cell transplant for the procedure. The patient is in agreement with the current plan. We were unable to start her on biphosphonate for her bone disease because the patient did not provide us  with dental clearance.  2 standing dental work. She was advised to call immediately if she has any other concerning symptoms in the interval. The total time spent in the appointment was 30 minutes. Disclaimer: This note was dictated with voice recognition software. Similar sounding words can inadvertently be  transcribed and may be missed upon review. Aurelio Blower, MD

## 2023-08-23 ENCOUNTER — Other Ambulatory Visit: Payer: Self-pay | Admitting: Internal Medicine

## 2023-08-23 DIAGNOSIS — C9000 Multiple myeloma not having achieved remission: Secondary | ICD-10-CM

## 2023-08-24 ENCOUNTER — Inpatient Hospital Stay (HOSPITAL_BASED_OUTPATIENT_CLINIC_OR_DEPARTMENT_OTHER): Payer: Medicare HMO | Admitting: Physician Assistant

## 2023-08-24 ENCOUNTER — Inpatient Hospital Stay: Payer: Medicare HMO

## 2023-08-24 ENCOUNTER — Other Ambulatory Visit: Payer: Medicare HMO

## 2023-08-24 ENCOUNTER — Other Ambulatory Visit: Payer: Self-pay | Admitting: Internal Medicine

## 2023-08-24 VITALS — BP 134/66 | HR 61 | Temp 97.5°F | Resp 14 | Wt 185.8 lb

## 2023-08-24 DIAGNOSIS — Z5112 Encounter for antineoplastic immunotherapy: Secondary | ICD-10-CM | POA: Diagnosis not present

## 2023-08-24 DIAGNOSIS — R682 Dry mouth, unspecified: Secondary | ICD-10-CM | POA: Diagnosis not present

## 2023-08-24 DIAGNOSIS — M25472 Effusion, left ankle: Secondary | ICD-10-CM | POA: Diagnosis not present

## 2023-08-24 DIAGNOSIS — C9000 Multiple myeloma not having achieved remission: Secondary | ICD-10-CM | POA: Diagnosis not present

## 2023-08-24 DIAGNOSIS — R197 Diarrhea, unspecified: Secondary | ICD-10-CM | POA: Diagnosis not present

## 2023-08-24 DIAGNOSIS — R5383 Other fatigue: Secondary | ICD-10-CM | POA: Diagnosis not present

## 2023-08-24 DIAGNOSIS — G893 Neoplasm related pain (acute) (chronic): Secondary | ICD-10-CM | POA: Diagnosis not present

## 2023-08-24 DIAGNOSIS — E876 Hypokalemia: Secondary | ICD-10-CM

## 2023-08-24 DIAGNOSIS — M25471 Effusion, right ankle: Secondary | ICD-10-CM | POA: Diagnosis not present

## 2023-08-24 LAB — CBC WITH DIFFERENTIAL (CANCER CENTER ONLY)
Abs Immature Granulocytes: 0.2 10*3/uL — ABNORMAL HIGH (ref 0.00–0.07)
Basophils Absolute: 0 10*3/uL (ref 0.0–0.1)
Basophils Relative: 0 %
Eosinophils Absolute: 0.1 10*3/uL (ref 0.0–0.5)
Eosinophils Relative: 2 %
HCT: 27.9 % — ABNORMAL LOW (ref 36.0–46.0)
Hemoglobin: 9.4 g/dL — ABNORMAL LOW (ref 12.0–15.0)
Immature Granulocytes: 5 %
Lymphocytes Relative: 15 %
Lymphs Abs: 0.6 10*3/uL — ABNORMAL LOW (ref 0.7–4.0)
MCH: 30.2 pg (ref 26.0–34.0)
MCHC: 33.7 g/dL (ref 30.0–36.0)
MCV: 89.7 fL (ref 80.0–100.0)
Monocytes Absolute: 0.6 10*3/uL (ref 0.1–1.0)
Monocytes Relative: 15 %
Neutro Abs: 2.5 10*3/uL (ref 1.7–7.7)
Neutrophils Relative %: 63 %
Platelet Count: 135 10*3/uL — ABNORMAL LOW (ref 150–400)
RBC: 3.11 MIL/uL — ABNORMAL LOW (ref 3.87–5.11)
RDW: 15.7 % — ABNORMAL HIGH (ref 11.5–15.5)
WBC Count: 3.9 10*3/uL — ABNORMAL LOW (ref 4.0–10.5)
nRBC: 0 % (ref 0.0–0.2)

## 2023-08-24 LAB — CMP (CANCER CENTER ONLY)
ALT: 23 U/L (ref 0–44)
AST: 19 U/L (ref 15–41)
Albumin: 3.5 g/dL (ref 3.5–5.0)
Alkaline Phosphatase: 51 U/L (ref 38–126)
Anion gap: 6 (ref 5–15)
BUN: 22 mg/dL (ref 8–23)
CO2: 30 mmol/L (ref 22–32)
Calcium: 8 mg/dL — ABNORMAL LOW (ref 8.9–10.3)
Chloride: 107 mmol/L (ref 98–111)
Creatinine: 1.25 mg/dL — ABNORMAL HIGH (ref 0.44–1.00)
GFR, Estimated: 46 mL/min — ABNORMAL LOW (ref >60)
Glucose, Bld: 89 mg/dL (ref 70–99)
Potassium: 2.8 mmol/L — ABNORMAL LOW (ref 3.5–5.1)
Sodium: 143 mmol/L (ref 135–145)
Total Bilirubin: 1.2 mg/dL (ref 0.0–1.2)
Total Protein: 5.3 g/dL — ABNORMAL LOW (ref 6.5–8.1)

## 2023-08-24 MED ORDER — POTASSIUM CHLORIDE CRYS ER 20 MEQ PO TBCR: 20.0000 meq | EXTENDED_RELEASE_TABLET | Freq: Two times a day (BID) | ORAL | 0 refills | Status: DC

## 2023-08-24 MED ORDER — DEXAMETHASONE 4 MG PO TABS
20.0000 mg | ORAL_TABLET | Freq: Once | ORAL | Status: AC
  Administered 2023-08-24: 20 mg via ORAL
  Filled 2023-08-24: qty 5

## 2023-08-24 MED ORDER — DIPHENHYDRAMINE HCL 25 MG PO CAPS
50.0000 mg | ORAL_CAPSULE | Freq: Once | ORAL | Status: AC
  Administered 2023-08-24: 50 mg via ORAL
  Filled 2023-08-24: qty 2

## 2023-08-24 MED ORDER — BORTEZOMIB CHEMO SQ INJECTION 3.5 MG (2.5MG/ML)
1.3000 mg/m2 | Freq: Once | INTRAMUSCULAR | Status: AC
  Administered 2023-08-24: 2.75 mg via SUBCUTANEOUS
  Filled 2023-08-24: qty 1.1

## 2023-08-24 MED ORDER — HYDROCODONE-ACETAMINOPHEN 5-325 MG PO TABS
1.0000 | ORAL_TABLET | Freq: Four times a day (QID) | ORAL | 0 refills | Status: DC | PRN
Start: 2023-08-24 — End: 2023-12-15

## 2023-08-24 MED ORDER — ACETAMINOPHEN 325 MG PO TABS
650.0000 mg | ORAL_TABLET | Freq: Once | ORAL | Status: AC
  Administered 2023-08-24: 650 mg via ORAL
  Filled 2023-08-24: qty 2

## 2023-08-24 MED ORDER — DARATUMUMAB-HYALURONIDASE-FIHJ 1800-30000 MG-UT/15ML ~~LOC~~ SOLN
1800.0000 mg | Freq: Once | SUBCUTANEOUS | Status: AC
Start: 2023-08-24 — End: 2023-08-24
  Administered 2023-08-24: 1800 mg via SUBCUTANEOUS
  Filled 2023-08-24: qty 15

## 2023-08-24 NOTE — Patient Instructions (Signed)
 [facility]  Discharge Instructions: Thank you for choosing [facility] to provide your oncology and hematology care.   If you have a lab appointment with the Cancer Center, please go directly to the Cancer Center and check in at the registration area.   Wear comfortable clothing and clothing appropriate for easy access to any Portacath or PICC line.   We strive to give you quality time with your provider. You may need to reschedule your appointment if you arrive late (15 or more minutes).  Arriving late affects you and other patients whose appointments are after yours.  Also, if you miss three or more appointments without notifying the office, you may be dismissed from the clinic at the provider's discretion.      For prescription refill requests, have your pharmacy contact our office and allow 72 hours for refills to be completed.    Today you received the following chemotherapy and/or immunotherapy agents Velcade, Darzalex Faspro.    To help prevent nausea and vomiting after your treatment, we encourage you to take your nausea medication as directed.  BELOW ARE SYMPTOMS THAT SHOULD BE REPORTED IMMEDIATELY: *FEVER GREATER THAN 100.4 F (38 C) OR HIGHER *CHILLS OR SWEATING *NAUSEA AND VOMITING THAT IS NOT CONTROLLED WITH YOUR NAUSEA MEDICATION *UNUSUAL SHORTNESS OF BREATH *UNUSUAL BRUISING OR BLEEDING *URINARY PROBLEMS (pain or burning when urinating, or frequent urination) *BOWEL PROBLEMS (unusual diarrhea, constipation, pain near the anus) TENDERNESS IN MOUTH AND THROAT WITH OR WITHOUT PRESENCE OF ULCERS (sore throat, sores in mouth, or a toothache) UNUSUAL RASH, SWELLING OR PAIN  UNUSUAL VAGINAL DISCHARGE OR ITCHING   Items with * indicate a potential emergency and should be followed up as soon as possible or go to the Emergency Department if any problems should occur.  Please show the CHEMOTHERAPY ALERT CARD or  IMMUNOTHERAPY ALERT CARD at check-in to the Emergency Department and triage nurse.  Should you have questions after your visit or need to cancel or reschedule your appointment, please contact [facility]  Dept: [phone]  and follow the prompts.  Office hours are 8:00 a.m. to 4:30 p.m. Monday - Friday. Please note that voicemails left after 4:00 p.m. may not be returned until the following business day.  We are closed weekends and major holidays. You have access to a nurse at all times for urgent questions. Please call the main number to the clinic Dept: [phone] and follow the prompts.   For any non-urgent questions, you may also contact your provider using MyChart. We now offer e-Visits for anyone 86 and older to request care online for non-urgent symptoms. For details visit [email or web address].   Also download the MyChart app! Go to the app store, search "MyChart", open the app, select [place], and log in with your MyChart username and password.

## 2023-08-25 NOTE — Progress Notes (Signed)
 Referral faxed to [facility] at [phone] with confirmation.

## 2023-08-29 ENCOUNTER — Emergency Department (HOSPITAL_BASED_OUTPATIENT_CLINIC_OR_DEPARTMENT_OTHER)
Admission: EM | Admit: 2023-08-29 | Discharge: 2023-08-30 | Disposition: A | Discharge status: Home / Self Care | Attending: Emergency Medicine | Admitting: Emergency Medicine

## 2023-08-29 ENCOUNTER — Other Ambulatory Visit: Payer: Self-pay

## 2023-08-29 ENCOUNTER — Ambulatory Visit (HOSPITAL_COMMUNITY): Admission: EM | Admit: 2023-08-29 | Discharge: 2023-08-29 | Disposition: A | Discharge status: Home / Self Care

## 2023-08-29 ENCOUNTER — Encounter (HOSPITAL_BASED_OUTPATIENT_CLINIC_OR_DEPARTMENT_OTHER): Payer: Self-pay

## 2023-08-29 ENCOUNTER — Encounter (HOSPITAL_COMMUNITY): Payer: Self-pay | Admitting: Emergency Medicine

## 2023-08-29 DIAGNOSIS — H5319 Other subjective visual disturbances: Secondary | ICD-10-CM | POA: Diagnosis not present

## 2023-08-29 DIAGNOSIS — I10 Essential (primary) hypertension: Secondary | ICD-10-CM | POA: Insufficient documentation

## 2023-08-29 DIAGNOSIS — H539 Unspecified visual disturbance: Secondary | ICD-10-CM | POA: Diagnosis not present

## 2023-08-29 DIAGNOSIS — R6889 Other general symptoms and signs: Secondary | ICD-10-CM

## 2023-08-29 DIAGNOSIS — N179 Acute kidney failure, unspecified: Secondary | ICD-10-CM | POA: Insufficient documentation

## 2023-08-29 DIAGNOSIS — Z79899 Other long term (current) drug therapy: Secondary | ICD-10-CM | POA: Insufficient documentation

## 2023-08-29 DIAGNOSIS — Z7982 Long term (current) use of aspirin: Secondary | ICD-10-CM | POA: Diagnosis not present

## 2023-08-29 DIAGNOSIS — I9589 Other hypotension: Secondary | ICD-10-CM

## 2023-08-29 DIAGNOSIS — Z7901 Long term (current) use of anticoagulants: Secondary | ICD-10-CM | POA: Insufficient documentation

## 2023-08-29 DIAGNOSIS — R197 Diarrhea, unspecified: Secondary | ICD-10-CM | POA: Diagnosis present

## 2023-08-29 DIAGNOSIS — Z8579 Personal history of other malignant neoplasms of lymphoid, hematopoietic and related tissues: Secondary | ICD-10-CM | POA: Diagnosis not present

## 2023-08-29 DIAGNOSIS — E876 Hypokalemia: Secondary | ICD-10-CM | POA: Insufficient documentation

## 2023-08-29 LAB — COMPREHENSIVE METABOLIC PANEL WITH GFR
ALT: 16 U/L (ref 0–44)
AST: 14 U/L — ABNORMAL LOW (ref 15–41)
Albumin: 3.7 g/dL (ref 3.5–5.0)
Alkaline Phosphatase: 56 U/L (ref 38–126)
Anion gap: 10 (ref 5–15)
BUN: 27 mg/dL — ABNORMAL HIGH (ref 8–23)
CO2: 28 mmol/L (ref 22–32)
Calcium: 9.1 mg/dL (ref 8.9–10.3)
Chloride: 105 mmol/L (ref 98–111)
Creatinine, Ser: 2.02 mg/dL — ABNORMAL HIGH (ref 0.44–1.00)
GFR, Estimated: 26 mL/min — ABNORMAL LOW (ref >60)
Glucose, Bld: 97 mg/dL (ref 70–99)
Potassium: 2.9 mmol/L — ABNORMAL LOW (ref 3.5–5.1)
Sodium: 143 mmol/L (ref 135–145)
Total Bilirubin: 1.3 mg/dL — ABNORMAL HIGH (ref 0.0–1.2)
Total Protein: 5.2 g/dL — ABNORMAL LOW (ref 6.5–8.1)

## 2023-08-29 LAB — CBC WITH DIFFERENTIAL/PLATELET
Abs Immature Granulocytes: 0.11 10*3/uL — ABNORMAL HIGH (ref 0.00–0.07)
Basophils Absolute: 0 10*3/uL (ref 0.0–0.1)
Basophils Relative: 0 %
Eosinophils Absolute: 0.1 10*3/uL (ref 0.0–0.5)
Eosinophils Relative: 1 %
HCT: 29.5 % — ABNORMAL LOW (ref 36.0–46.0)
Hemoglobin: 9.9 g/dL — ABNORMAL LOW (ref 12.0–15.0)
Immature Granulocytes: 2 %
Lymphocytes Relative: 13 %
Lymphs Abs: 0.6 10*3/uL — ABNORMAL LOW (ref 0.7–4.0)
MCH: 30.9 pg (ref 26.0–34.0)
MCHC: 33.6 g/dL (ref 30.0–36.0)
MCV: 92.2 fL (ref 80.0–100.0)
Monocytes Absolute: 1.1 10*3/uL — ABNORMAL HIGH (ref 0.1–1.0)
Monocytes Relative: 24 %
Neutro Abs: 2.8 10*3/uL (ref 1.7–7.7)
Neutrophils Relative %: 60 %
Platelets: 114 10*3/uL — ABNORMAL LOW (ref 150–400)
RBC: 3.2 MIL/uL — ABNORMAL LOW (ref 3.87–5.11)
RDW: 15.9 % — ABNORMAL HIGH (ref 11.5–15.5)
WBC: 4.7 10*3/uL (ref 4.0–10.5)
nRBC: 0.6 % — ABNORMAL HIGH (ref 0.0–0.2)

## 2023-08-29 MED ORDER — POTASSIUM CHLORIDE CRYS ER 20 MEQ PO TBCR
40.0000 meq | EXTENDED_RELEASE_TABLET | Freq: Once | ORAL | Status: AC
  Administered 2023-08-29: 40 meq via ORAL
  Filled 2023-08-29: qty 2

## 2023-08-29 MED ORDER — LACTATED RINGERS IV BOLUS
1000.0000 mL | Freq: Once | INTRAVENOUS | Status: AC
  Administered 2023-08-29: 1000 mL via INTRAVENOUS

## 2023-08-29 NOTE — ED Triage Notes (Signed)
 Pt "brightness x5 days." Denies HA, blurred vision, NV, neuro deficit. States that she was seen at [facility] for same, sent to ED for further eval of hypotension.

## 2023-08-29 NOTE — Discharge Instructions (Signed)
 The visual symptoms along with the low blood pressure and being on chemotherapy for multiple myeloma are all concerning symptoms and we feel that a more advanced workup should be performed tonight.  Recommend going to the emergency department or one of the med centers for further evaluation.

## 2023-08-29 NOTE — ED Provider Notes (Signed)
 [place] EMERGENCY DEPARTMENT AT [facility] Provider Note   CSN: [id number] Arrival date & time: 08/29/23  1934     History  Chief Complaint  Patient presents with   Hypotension    Joanna Ray is a 73 y.o. female with PMH as listed below who presents with c/f bright lights and low blood pressure. Patient wen to urgent care today with complaints of feeling like the lights are too bright. This has been going on for several days now but today it was worse. No h/o similar. She denies any headaches, pain, weakness, dizziness, lightheadedness, double vision, loss of vision, floaters/flashers, ocular trauma, or any other constitutional symptoms. She is on chemotherapy for multiple myeloma. Has had some diarrhea w/ chemotherapy but no nausea/vomiting. Hasn't been eating/drinking as well as she normally does. States the lights appear normal to her now. No recent changes to medications.   Past Medical History:  Diagnosis Date   Anxiety    Arthritis    Cancer (HCC) 2021   multiple myloma Right Arm   Cataract    GERD (gastroesophageal reflux disease)    Hyperlipidemia    Hypertension    Urticaria        Home Medications Prior to Admission medications   Medication Sig Start Date End Date Taking? Authorizing Provider  acyclovir  (ZOVIRAX ) 400 MG tablet Take 1 tablet (400 mg total) by mouth 2 (two) times daily. 05/20/23   Marlene Simas, MD  ALPRAZolam  (XANAX ) 0.5 MG tablet Take 1 tablet by mouth twice daily as needed for anxiety 05/27/22   Sylvan Evener, MD  apixaban  (ELIQUIS ) 2.5 MG TABS tablet Take 1 tablet (2.5 mg total) by mouth 2 (two) times daily. 06/08/23   Heilingoetter, Cassandra L, PA-C  aspirin  81 MG tablet Take 81 mg by mouth daily.    [provider]  cholecalciferol  (VITAMIN D ) 1000 UNITS tablet Take 1,000 Units by mouth daily.    [provider]  cyclobenzaprine  (FLEXERIL ) 10 MG tablet Take 1 tablet by mouth three times daily as needed for  muscle spasm 05/20/23   Sylvan Evener, MD  dexamethasone  (DECADRON ) 4 MG tablet Take 5 tabs (20 mg) weekly the day after daratumumab  for 12 weeks. Take with breakfast. 05/20/23   Marlene Simas, MD  EPINEPHrine  (EPIPEN  2-PAK) 0.3 mg/0.3 mL IJ SOAJ injection Inject 0.3 mg into the muscle as needed for anaphylaxis. 07/14/22   Sylvan Evener, MD  fexofenadine  (ALLEGRA ) 180 MG tablet Take 1 tablet (180 mg total) by mouth daily. 06/29/19   Brian Campanile, MD  HYDROcodone -acetaminophen  (NORCO/VICODIN) 5-325 MG tablet Take 1 tablet by mouth every 6 (six) hours as needed for moderate pain (pain score 4-6). 08/24/23   Heilingoetter, Cassandra L, PA-C  hydrocortisone valerate cream (WESTCORT) 0.2 % 1 app    [provider]  lenalidomide  (REVLIMID ) 25 MG capsule TAKE 1 CAPSULE BY MOUTH EVERY DAY FOR 14 DAYS, TAKE 7 DAYS OFF 08/24/23   Marlene Simas, MD  meclizine  (ANTIVERT ) 25 MG tablet Take 1 tablet (25 mg total) by mouth 3 (three) times daily as needed for dizziness. 01/02/23   Charmayne Cooper, MD  ondansetron  (ZOFRAN ) 8 MG tablet Take 1 tablet (8 mg total) by mouth every 8 (eight) hours as needed for nausea or vomiting. 05/20/23   Marlene Simas, MD  potassium chloride  SA (KLOR-CON  M) 20 MEQ tablet Take 1 tablet (20 mEq total) by mouth 2 (two) times daily. 08/24/23   Heilingoetter, Cassandra L, PA-C  prochlorperazine  (  COMPAZINE ) 10 MG tablet TAKE 1 TABLET BY MOUTH EVERY 6 HOURS AS NEEDED FOR NAUSEA FOR VOMITING 08/23/23   Marlene Simas, MD  rosuvastatin  (CRESTOR ) 20 MG tablet TAKE 1/2 TABLET ONE TIME DAILY 02/20/23   Sylvan Evener, MD  valsartan -hydrochlorothiazide  (DIOVAN -HCT) 160-25 MG tablet TAKE 1 TABLET EVERY DAY 03/10/23   Sylvan Evener, MD  vitamin E  100 UNIT capsule Take 100 Units by mouth daily.    [provider]      Allergies    Corn-containing products and Shellfish allergy    Review of Systems   Review of Systems A 10 point review of systems was performed  and is negative unless otherwise reported in HPI.  Physical Exam Updated Vital Signs BP (!) 111/53   Pulse (!) 57   Temp 98.4 F (36.9 C)   Resp 16   SpO2 99%  Physical Exam General: Normal appearing female, lying in bed.  HEENT: PERRLA 3mm bilaterally, EOMI, no nystagmus, quiet conjunctivae, Sclera anicteric, MMM, trachea midline.  Cardiology: RRR, no murmurs/rubs/gallops. Joanna Ray  Resp: Normal respiratory rate and effort. CTAB, no wheezes, rhonchi, crackles.  Abd: Soft, non-tender, non-distended. No rebound tenderness or guarding.  GU: Deferred. MSK: No peripheral edema or signs of trauma. Extremities without deformity or TTP. No cyanosis or clubbing. Skin: warm, dry. Neuro: A&Ox4, CNs II-XII grossly intact. MAEs. Sensation grossly intact.  Psych: Normal mood and affect.   ED Results / Procedures / Treatments   Labs (all labs ordered are listed, but only abnormal results are displayed) Labs Reviewed  COMPREHENSIVE METABOLIC PANEL WITH GFR - Abnormal; Notable for the following components:      Result Value   Potassium 2.9 (*)    BUN 27 (*)    Creatinine, Ser 2.02 (*)    Total Protein 5.2 (*)    AST 14 (*)    Total Bilirubin 1.3 (*)    GFR, Estimated 26 (*)    All other components within normal limits  CBC WITH DIFFERENTIAL/PLATELET - Abnormal; Notable for the following components:   RBC 3.20 (*)    Hemoglobin 9.9 (*)    HCT 29.5 (*)    RDW 15.9 (*)    Platelets 114 (*)    nRBC 0.6 (*)    Lymphs Abs 0.6 (*)    Monocytes Absolute 1.1 (*)    Abs Immature Granulocytes 0.11 (*)    All other components within normal limits    EKG None  Radiology No results found.  Procedures Procedures    Medications Ordered in ED Medications  lactated ringers bolus 1,000 mL (has no administration in time range)  potassium chloride  SA (KLOR-CON  M) CR tablet 40 mEq (40 mEq Oral Given 08/29/23 2337)    ED Course/ Medical Decision Making/ A&P                          Medical  Decision Making Amount and/or Complexity of Data Reviewed Labs: ordered. Decision-making details documented in ED Course.  Risk Prescription drug management.    This patient presents to the ED for concern of light sensitivity, this involves an extensive number of treatment options, and is a complaint that carries with it a high risk of complications and morbidity. .   MDM:    Patient with no stroke-like sxs, headache, pupils are wnl, no ocular pain or abnormalities noted on exam. Patient with no recent medication changes.  Did not take Benadryl  today.  Does not take  other anticholinergics, has no HA associated with this. No floaters/flashers or curtain coming down or other visual changes to indicate an ocular emergency..  She does have Revlimid  listed as one of her chemotherapy medications and there is noted to be light sensitivity as a possible side effect of this medication. Patient's sxs are improved now and she already has f/u scheduled w/ oncology on Tuesday. Advised that she discuss this with her oncologist.   Also noted to be hypokalemic 2.9 and with AKI, BL Cr ~1.1-1.2 and now 2.02. Had soft BPs noted at urgent care. Asymptomatic, no lightheadedness/weakness, has had some diarrhea with chemotherapy, which is likely the cause, will give 1L IVF here and replete K.   Clinical Course as of 08/29/23 2346  Sun Aug 29, 2023  2206 Creatinine(!): 2.02 +AKI [HN]    Clinical Course User Index [HN] Merdis Stalling, MD    Labs: I Ordered, and personally interpreted labs.  The pertinent results include:  those listed above  Additional history obtained from chart review, husband at bedside.    Reevaluation: After the interventions noted above, I reevaluated the patient and found that they have :resolved  Social Determinants of Health: Lives independently  Disposition:  Patient is signed out to the oncoming ED physician Dr. Adrain Alar who is made aware of her history, presentation, exam,  workup, and plan.    Co morbidities that complicate the patient evaluation  Past Medical History:  Diagnosis Date   Anxiety    Arthritis    Cancer (HCC) 2021   multiple myloma Right Arm   Cataract    GERD (gastroesophageal reflux disease)    Hyperlipidemia    Hypertension    Urticaria      Medicines Meds ordered this encounter  Medications   potassium chloride  SA (KLOR-CON  M) CR tablet 40 mEq   lactated ringers bolus 1,000 mL    I have reviewed the patients home medicines and have made adjustments as needed  Problem List / ED Course: Problem List Items Addressed This Visit   None Visit Diagnoses       Hypokalemia    -  Primary     AKI (acute kidney injury) (HCC)         Light sensitivity                       This note was created using dictation software, which may contain spelling or grammatical errors.    Merdis Stalling, MD 08/29/23 [phone]

## 2023-08-29 NOTE — ED Provider Notes (Signed)
  Physical Exam  BP (!) 111/53   Pulse (!) 57   Temp 98.4 F (36.9 C)   Resp 16   SpO2 99%   Physical Exam  Procedures  Procedures  ED Course / MDM   Clinical Course as of 08/29/23 2312  Sun Aug 29, 2023  2206 Creatinine(!): 2.02 +AKI [HN]    Clinical Course User Index [HN] Merdis Stalling, MD   Medical Decision Making Amount and/or Complexity of Data Reviewed Labs: ordered. Decision-making details documented in ED Course.  Risk Prescription drug management.   41F, hx of MM on chemo, presenting with light sensitivity, possible side effect of one of her chemo meds, now resolved. Currently well, neuro intact, went to urgent care initially, hypotensive, sent to the ER. Has an AKI, getting fluids, getting potassium supplementation, Likely DC with close follow-up.    Following fluid bolus, the patient was feeling symptomatically improved.  She is hemodynamically stable.  Considered admission for observation and recheck of her renal function after volume resuscitation versus discharge and close outpatient follow-up.  After consideration, the patient elected for discharge as she has close follow-up with her doctor on Tuesday.  Return precautions provided, patient overall stable for close outpatient follow-up.    Rosealee Concha, MD 08/30/23 [phone]

## 2023-08-29 NOTE — ED Provider Notes (Signed)
 [facility]    CSN: [id number] Arrival date & time: 08/29/23  1636      History   Chief Complaint Chief Complaint  Patient presents with   Difficulty with brightness x 5 days    HPI Joanna Ray is a 73 y.o. female.   73 year old female who presents urgent care with complaints of feeling like the lights are too bright.  This has been going on for several days now but today it was much worse.  She reports that when she was looking at the cars in front of her that she could see the tail lights but that the car was cleared out due to the brightness.  She denies any headaches, pain, weakness, dizziness, lightheadedness, double vision, loss of vision or any other constitutional symptoms.  She is on chemotherapy for multiple myeloma.     Past Medical History:  Diagnosis Date   Anxiety    Arthritis    Cancer (HCC) 2021   multiple myloma Right Arm   Cataract    GERD (gastroesophageal reflux disease)    Hyperlipidemia    Hypertension    Urticaria     Patient Active Problem List   Diagnosis Date Noted   Encounter for antineoplastic chemotherapy 07/06/2023   Multiple myeloma (HCC) 05/20/2023   Status post total shoulder arthroplasty, right 01/17/2023   History of plasmacytoma 01/17/2023   Angioedema 01/21/2019   Vertigo 09/28/2016   Hyperlipidemia 09/25/2011   Hypertension     Past Surgical History:  Procedure Laterality Date   CATARACT EXTRACTION, BILATERAL Bilateral    DILATION AND CURETTAGE OF UTERUS      OB History   No obstetric history on file.      Home Medications    Prior to Admission medications   Medication Sig Start Date End Date Taking? Authorizing Provider  acyclovir  (ZOVIRAX ) 400 MG tablet Take 1 tablet (400 mg total) by mouth 2 (two) times daily. 05/20/23  Yes Marlene Simas, MD  ALPRAZolam  (XANAX ) 0.5 MG tablet Take 1 tablet by mouth twice daily as needed for anxiety 05/27/22  Yes Baxley, Jaynie Meyers, MD  apixaban  (ELIQUIS ) 2.5 MG  TABS tablet Take 1 tablet (2.5 mg total) by mouth 2 (two) times daily. 06/08/23  Yes Heilingoetter, Cassandra L, PA-C  aspirin  81 MG tablet Take 81 mg by mouth daily.   Yes [provider]  cholecalciferol  (VITAMIN D ) 1000 UNITS tablet Take 1,000 Units by mouth daily.   Yes [provider]  cyclobenzaprine  (FLEXERIL ) 10 MG tablet Take 1 tablet by mouth three times daily as needed for muscle spasm 05/20/23  Yes Baxley, Jaynie Meyers, MD  dexamethasone  (DECADRON ) 4 MG tablet Take 5 tabs (20 mg) weekly the day after daratumumab  for 12 weeks. Take with breakfast. 05/20/23  Yes Marlene Simas, MD  EPINEPHrine  (EPIPEN  2-PAK) 0.3 mg/0.3 mL IJ SOAJ injection Inject 0.3 mg into the muscle as needed for anaphylaxis. 07/14/22  Yes Baxley, Jaynie Meyers, MD  fexofenadine  (ALLEGRA ) 180 MG tablet Take 1 tablet (180 mg total) by mouth daily. 06/29/19  Yes Padgett, Rhoderick Ceo, MD  HYDROcodone -acetaminophen  (NORCO/VICODIN) 5-325 MG tablet Take 1 tablet by mouth every 6 (six) hours as needed for moderate pain (pain score 4-6). 08/24/23  Yes Heilingoetter, Cassandra L, PA-C  hydrocortisone valerate cream (WESTCORT) 0.2 % 1 app   Yes [provider]  lenalidomide  (REVLIMID ) 25 MG capsule TAKE 1 CAPSULE BY MOUTH EVERY DAY FOR 14 DAYS, TAKE 7 DAYS OFF 08/24/23  Yes Marlene Simas,  MD  meclizine  (ANTIVERT ) 25 MG tablet Take 1 tablet (25 mg total) by mouth 3 (three) times daily as needed for dizziness. 01/02/23  Yes Charmayne Cooper, MD  ondansetron  (ZOFRAN ) 8 MG tablet Take 1 tablet (8 mg total) by mouth every 8 (eight) hours as needed for nausea or vomiting. 05/20/23  Yes Marlene Simas, MD  potassium chloride  SA (KLOR-CON  M) 20 MEQ tablet Take 1 tablet (20 mEq total) by mouth 2 (two) times daily. 08/24/23  Yes Heilingoetter, Cassandra L, PA-C  prochlorperazine  (COMPAZINE ) 10 MG tablet TAKE 1 TABLET BY MOUTH EVERY 6 HOURS AS NEEDED FOR NAUSEA FOR VOMITING 08/23/23  Yes Marlene Simas, MD  rosuvastatin   (CRESTOR ) 20 MG tablet TAKE 1/2 TABLET ONE TIME DAILY 02/20/23  Yes Sylvan Evener, MD  valsartan -hydrochlorothiazide  (DIOVAN -HCT) 160-25 MG tablet TAKE 1 TABLET EVERY DAY 03/10/23  Yes Baxley, Jaynie Meyers, MD  vitamin E  100 UNIT capsule Take 100 Units by mouth daily.   Yes [provider]    Family History Family History  Problem Relation Age of Onset   Stroke Mother    Stroke Father    Diabetes Sister    Breast cancer Sister 2   Ovarian cancer Maternal Grandmother 61   Colon cancer Neg Hx    Colon polyps Neg Hx    Esophageal cancer Neg Hx    Rectal cancer Neg Hx    Stomach cancer Neg Hx     Social History Social History   Tobacco Use   Smoking status: Former    Current packs/day: 0.00    Average packs/day: 0.3 packs/day for 20.0 years (5.0 ttl pk-yrs)    Types: Cigarettes    Start date: 31    Quit date: 1980    Years since quitting: 45.3   Smokeless tobacco: Never  Vaping Use   Vaping status: Never Used  Substance Use Topics   Alcohol use: No   Drug use: No     Allergies   Corn-containing products and Shellfish allergy   Review of Systems Review of Systems  Constitutional:  Negative for chills and fever.  HENT:  Negative for ear pain and sore throat.   Eyes:  Positive for visual disturbance. Negative for pain.  Respiratory:  Negative for cough and shortness of breath.   Cardiovascular:  Negative for chest pain and palpitations.  Gastrointestinal:  Negative for abdominal pain and vomiting.  Genitourinary:  Negative for dysuria and hematuria.  Musculoskeletal:  Negative for arthralgias and back pain.  Skin:  Negative for color change and rash.  Neurological:  Negative for seizures and syncope.  All other systems reviewed and are negative.    Physical Exam Triage Vital Signs ED Triage Vitals  Encounter Vitals Group     BP 08/29/23 1651 (!) 87/55     Systolic BP Percentile --      Diastolic BP Percentile --      Pulse Rate 08/29/23 1651 (!) 58      Resp 08/29/23 1651 18     Temp 08/29/23 1651 98 F (36.7 C)     Temp Source 08/29/23 1651 Oral     SpO2 08/29/23 1651 94 %     Weight --      Height --      Head Circumference --      Peak Flow --      Pain Score 08/29/23 1653 0     Pain Loc --      Pain Education --  Exclude from Growth Chart --    No data found.  Updated Vital Signs BP 97/60 (BP Location: Right Arm)   Pulse 65   Temp 98 F (36.7 C) (Oral)   Resp 18   SpO2 96%   Visual Acuity Right Eye Distance: 20 30 Left Eye Distance: 20 30 Bilateral Distance: 20 25  Right Eye Near:   Left Eye Near:    Bilateral Near:     Physical Exam Vitals and nursing note reviewed.  Constitutional:      General: She is not in acute distress.    Appearance: She is well-developed.  HENT:     Head: Normocephalic and atraumatic.  Eyes:     Extraocular Movements: Extraocular movements intact.     Conjunctiva/sclera: Conjunctivae normal.     Pupils: Pupils are equal, round, and reactive to light.  Cardiovascular:     Rate and Rhythm: Normal rate and regular rhythm.     Heart sounds: No murmur heard. Pulmonary:     Effort: Pulmonary effort is normal. No respiratory distress.     Breath sounds: Normal breath sounds.  Abdominal:     Palpations: Abdomen is soft.     Tenderness: There is no abdominal tenderness.  Musculoskeletal:        General: No swelling.     Cervical back: Neck supple.  Skin:    General: Skin is warm and dry.     Capillary Refill: Capillary refill takes less than 2 seconds.  Neurological:     Mental Status: She is alert.  Psychiatric:        Mood and Affect: Mood normal.      UC Treatments / Results  Labs (all labs ordered are listed, but only abnormal results are displayed) Labs Reviewed - No data to display  EKG   Radiology No results found.  Procedures Procedures (including critical care time)  Medications Ordered in UC Medications - No data to display  Initial Impression  / Assessment and Plan / UC Course  I have reviewed the triage vital signs and the nursing notes.  Pertinent labs & imaging results that were available during my care of the patient were reviewed by me and considered in my medical decision making (see chart for details).     Other specified hypotension  Visual disturbance   Unsure the exact nature of the visual disturbances that she is having but giving the new hypotension along with the visual disturbances with the patient beyond on chemotherapy for multiple myeloma, we recommend further evaluation at the emergency room where more advanced imaging and evaluation can be performed.  Final Clinical Impressions(s) / UC Diagnoses   Final diagnoses:  Other specified hypotension  Visual disturbance     Discharge Instructions      The visual symptoms along with the low blood pressure and being on chemotherapy for multiple myeloma are all concerning symptoms and we feel that a more advanced workup should be performed tonight.  Recommend going to the emergency department or one of the med centers for further evaluation.   ED Prescriptions   None    PDMP not reviewed this encounter.   Kreg Pesa, [place] 08/29/23 1839

## 2023-08-29 NOTE — ED Triage Notes (Signed)
 Patient states that she's been having problems w/"brightness" x 5 days.  No problems w/vision, denies headaches and pain.  Just states coming outside today it just seemed too bright.

## 2023-08-29 NOTE — ED Notes (Signed)
 Patient is being discharged from the Urgent Care and sent to the Emergency Department via private vehicle . Per Deidra Faster patient is in need of higher level of care due to further evaluation. Patient is aware and verbalizes understanding of plan of care.  Vitals:   08/29/23 1730 08/29/23 1733  BP: (!) 72/43 97/60  Pulse: 61 65  Resp: 18   Temp:    SpO2: 96%

## 2023-08-30 ENCOUNTER — Telehealth: Payer: Self-pay | Admitting: Medical Oncology

## 2023-08-30 NOTE — Telephone Encounter (Signed)
 Pt seen in ED for vision concerns -"everything is very bright and hurts her eyes". Improved during ED stay and d/c home. Oncology appt tomorrow.

## 2023-08-30 NOTE — Discharge Instructions (Addendum)
 You sustained a mild acute kidney injury and were fluid resuscitated in the emergency department.  Please follow-up closely with your primary doctor on Tuesday for recheck of your kidney function.  Your potassium was low, continue outpatient potassium supplementation.

## 2023-08-31 ENCOUNTER — Inpatient Hospital Stay: Payer: Medicare HMO | Attending: Internal Medicine

## 2023-08-31 ENCOUNTER — Telehealth: Payer: Self-pay

## 2023-08-31 ENCOUNTER — Inpatient Hospital Stay: Payer: Medicare HMO

## 2023-08-31 ENCOUNTER — Other Ambulatory Visit: Payer: Medicare HMO

## 2023-08-31 ENCOUNTER — Ambulatory Visit: Payer: Medicare HMO | Admitting: Physician Assistant

## 2023-08-31 VITALS — BP 126/60 | HR 51 | Temp 98.2°F | Resp 17 | Wt 193.0 lb

## 2023-08-31 DIAGNOSIS — C9000 Multiple myeloma not having achieved remission: Secondary | ICD-10-CM

## 2023-08-31 DIAGNOSIS — Z5112 Encounter for antineoplastic immunotherapy: Secondary | ICD-10-CM | POA: Insufficient documentation

## 2023-08-31 LAB — CMP (CANCER CENTER ONLY)
ALT: 15 U/L (ref 0–44)
AST: 16 U/L (ref 15–41)
Albumin: 3.2 g/dL — ABNORMAL LOW (ref 3.5–5.0)
Alkaline Phosphatase: 44 U/L (ref 38–126)
Anion gap: 4 — ABNORMAL LOW (ref 5–15)
BUN: 24 mg/dL — ABNORMAL HIGH (ref 8–23)
CO2: 30 mmol/L (ref 22–32)
Calcium: 8.1 mg/dL — ABNORMAL LOW (ref 8.9–10.3)
Chloride: 108 mmol/L (ref 98–111)
Creatinine: 1.2 mg/dL — ABNORMAL HIGH (ref 0.44–1.00)
GFR, Estimated: 48 mL/min — ABNORMAL LOW (ref >60)
Glucose, Bld: 92 mg/dL (ref 70–99)
Potassium: 3.6 mmol/L (ref 3.5–5.1)
Sodium: 142 mmol/L (ref 135–145)
Total Bilirubin: 1.3 mg/dL — ABNORMAL HIGH (ref 0.0–1.2)
Total Protein: 5 g/dL — ABNORMAL LOW (ref 6.5–8.1)

## 2023-08-31 LAB — CBC WITH DIFFERENTIAL (CANCER CENTER ONLY)
Abs Immature Granulocytes: 0.11 10*3/uL — ABNORMAL HIGH (ref 0.00–0.07)
Basophils Absolute: 0 10*3/uL (ref 0.0–0.1)
Basophils Relative: 1 %
Eosinophils Absolute: 0.1 10*3/uL (ref 0.0–0.5)
Eosinophils Relative: 3 %
HCT: 26.3 % — ABNORMAL LOW (ref 36.0–46.0)
Hemoglobin: 9 g/dL — ABNORMAL LOW (ref 12.0–15.0)
Immature Granulocytes: 3 %
Lymphocytes Relative: 20 %
Lymphs Abs: 0.7 10*3/uL (ref 0.7–4.0)
MCH: 30.8 pg (ref 26.0–34.0)
MCHC: 34.2 g/dL (ref 30.0–36.0)
MCV: 90.1 fL (ref 80.0–100.0)
Monocytes Absolute: 1 10*3/uL (ref 0.1–1.0)
Monocytes Relative: 29 %
Neutro Abs: 1.5 10*3/uL — ABNORMAL LOW (ref 1.7–7.7)
Neutrophils Relative %: 44 %
Platelet Count: 132 10*3/uL — ABNORMAL LOW (ref 150–400)
RBC: 2.92 MIL/uL — ABNORMAL LOW (ref 3.87–5.11)
RDW: 16.1 % — ABNORMAL HIGH (ref 11.5–15.5)
WBC Count: 3.4 10*3/uL — ABNORMAL LOW (ref 4.0–10.5)
nRBC: 0 % (ref 0.0–0.2)

## 2023-08-31 MED ORDER — BORTEZOMIB CHEMO SQ INJECTION 3.5 MG (2.5MG/ML)
1.3000 mg/m2 | Freq: Once | INTRAMUSCULAR | Status: AC
  Administered 2023-08-31: 2.75 mg via SUBCUTANEOUS
  Filled 2023-08-31: qty 1.1

## 2023-08-31 MED ORDER — PROCHLORPERAZINE MALEATE 10 MG PO TABS
10.0000 mg | ORAL_TABLET | Freq: Once | ORAL | Status: AC
  Administered 2023-08-31: 10 mg via ORAL
  Filled 2023-08-31: qty 1

## 2023-08-31 NOTE — Telephone Encounter (Signed)
 Re-faxed referral with confirmation to [phone].  Spoke with Sarah at [facility] and confirmed the referral has been received.

## 2023-08-31 NOTE — Patient Instructions (Signed)
 [facility] - A DEPT OF [facility]  Discharge Instructions: Thank you for choosing [facility] to provide your oncology and hematology care.   If you have a lab appointment with the Cancer Center, please go directly to the Cancer Center and check in at the registration area.   Wear comfortable clothing and clothing appropriate for easy access to any Portacath or PICC line.   We strive to give you quality time with your provider. You may need to reschedule your appointment if you arrive late (15 or more minutes).  Arriving late affects you and other patients whose appointments are after yours.  Also, if you miss three or more appointments without notifying the office, you may be dismissed from the clinic at the provider's discretion.      For prescription refill requests, have your pharmacy contact our office and allow 72 hours for refills to be completed.    Today you received the following chemotherapy and/or immunotherapy agents: Velcade    To help prevent nausea and vomiting after your treatment, we encourage you to take your nausea medication as directed.  BELOW ARE SYMPTOMS THAT SHOULD BE REPORTED IMMEDIATELY: *FEVER GREATER THAN 100.4 F (38 C) OR HIGHER *CHILLS OR SWEATING *NAUSEA AND VOMITING THAT IS NOT CONTROLLED WITH YOUR NAUSEA MEDICATION *UNUSUAL SHORTNESS OF BREATH *UNUSUAL BRUISING OR BLEEDING *URINARY PROBLEMS (pain or burning when urinating, or frequent urination) *BOWEL PROBLEMS (unusual diarrhea, constipation, pain near the anus) TENDERNESS IN MOUTH AND THROAT WITH OR WITHOUT PRESENCE OF ULCERS (sore throat, sores in mouth, or a toothache) UNUSUAL RASH, SWELLING OR PAIN  UNUSUAL VAGINAL DISCHARGE OR ITCHING   Items with * indicate a potential emergency and should be followed up as soon as possible or go to the Emergency Department if any problems should occur.  Please show the CHEMOTHERAPY ALERT CARD or IMMUNOTHERAPY  ALERT CARD at check-in to the Emergency Department and triage nurse.  Should you have questions after your visit or need to cancel or reschedule your appointment, please contact [facility] - A DEPT OF [facility]  Dept: [phone]  and follow the prompts.  Office hours are 8:00 a.m. to 4:30 p.m. Monday - Friday. Please note that voicemails left after 4:00 p.m. may not be returned until the following business day.  We are closed weekends and major holidays. You have access to a nurse at all times for urgent questions. Please call the main number to the clinic Dept: [phone] and follow the prompts.   For any non-urgent questions, you may also contact your provider using MyChart. We now offer e-Visits for anyone 69 and older to request care online for non-urgent symptoms. For details visit [email or web address].   Also download the MyChart app! Go to the app store, search "MyChart", open the app, select , and log in with your MyChart username and password.

## 2023-09-01 ENCOUNTER — Encounter: Payer: Self-pay | Admitting: Internal Medicine

## 2023-09-02 ENCOUNTER — Other Ambulatory Visit: Payer: Self-pay | Admitting: Internal Medicine

## 2023-09-02 ENCOUNTER — Telehealth: Payer: Self-pay | Admitting: Medical Oncology

## 2023-09-02 NOTE — Telephone Encounter (Signed)
"  [facility] " will fax dental clearance for Zometa.

## 2023-09-02 NOTE — Telephone Encounter (Signed)
 Dental Clearance letter received from [facility] .  Joanna Ray completed dental treatment and is cleared from a dental perspective , Dr. Lu Rump, to proceed with cancer treatment . Dental letter sent to be scanned.

## 2023-09-07 ENCOUNTER — Telehealth: Payer: Self-pay | Admitting: Medical Oncology

## 2023-09-07 NOTE — Telephone Encounter (Signed)
 Refill Lenalidomide ?  She has an appt with [facility] Gwen Lek on Thursday 05/15. Do you want to refill lenalidomide ?

## 2023-09-09 DIAGNOSIS — C9000 Multiple myeloma not having achieved remission: Secondary | ICD-10-CM | POA: Diagnosis not present

## 2023-09-10 DIAGNOSIS — C9000 Multiple myeloma not having achieved remission: Secondary | ICD-10-CM | POA: Diagnosis not present

## 2023-09-14 ENCOUNTER — Other Ambulatory Visit: Payer: Medicare HMO

## 2023-09-14 ENCOUNTER — Telehealth: Payer: Self-pay | Admitting: Medical Oncology

## 2023-09-14 ENCOUNTER — Inpatient Hospital Stay: Payer: Medicare HMO

## 2023-09-14 ENCOUNTER — Inpatient Hospital Stay (HOSPITAL_BASED_OUTPATIENT_CLINIC_OR_DEPARTMENT_OTHER): Admitting: Internal Medicine

## 2023-09-14 ENCOUNTER — Inpatient Hospital Stay

## 2023-09-14 ENCOUNTER — Encounter: Payer: Self-pay | Admitting: Internal Medicine

## 2023-09-14 ENCOUNTER — Ambulatory Visit: Payer: Medicare HMO | Admitting: Internal Medicine

## 2023-09-14 ENCOUNTER — Other Ambulatory Visit

## 2023-09-14 VITALS — BP 150/81 | HR 69 | Temp 98.3°F | Resp 17 | Ht 65.0 in | Wt 191.5 lb

## 2023-09-14 DIAGNOSIS — C9000 Multiple myeloma not having achieved remission: Secondary | ICD-10-CM

## 2023-09-14 DIAGNOSIS — Z5112 Encounter for antineoplastic immunotherapy: Secondary | ICD-10-CM | POA: Diagnosis not present

## 2023-09-14 LAB — CBC WITH DIFFERENTIAL (CANCER CENTER ONLY)
Abs Immature Granulocytes: 0.54 10*3/uL — ABNORMAL HIGH (ref 0.00–0.07)
Basophils Absolute: 0.1 10*3/uL (ref 0.0–0.1)
Basophils Relative: 3 %
Eosinophils Absolute: 0.1 10*3/uL (ref 0.0–0.5)
Eosinophils Relative: 2 %
HCT: 28.9 % — ABNORMAL LOW (ref 36.0–46.0)
Hemoglobin: 9.6 g/dL — ABNORMAL LOW (ref 12.0–15.0)
Immature Granulocytes: 13 %
Lymphocytes Relative: 15 %
Lymphs Abs: 0.6 10*3/uL — ABNORMAL LOW (ref 0.7–4.0)
MCH: 30.3 pg (ref 26.0–34.0)
MCHC: 33.2 g/dL (ref 30.0–36.0)
MCV: 91.2 fL (ref 80.0–100.0)
Monocytes Absolute: 0.4 10*3/uL (ref 0.1–1.0)
Monocytes Relative: 9 %
Neutro Abs: 2.5 10*3/uL (ref 1.7–7.7)
Neutrophils Relative %: 58 %
Platelet Count: 299 10*3/uL (ref 150–400)
RBC: 3.17 MIL/uL — ABNORMAL LOW (ref 3.87–5.11)
RDW: 15.9 % — ABNORMAL HIGH (ref 11.5–15.5)
Smear Review: NORMAL
WBC Count: 4.2 10*3/uL (ref 4.0–10.5)
nRBC: 0 % (ref 0.0–0.2)

## 2023-09-14 LAB — CMP (CANCER CENTER ONLY)
ALT: 18 U/L (ref 0–44)
AST: 25 U/L (ref 15–41)
Albumin: 3.7 g/dL (ref 3.5–5.0)
Alkaline Phosphatase: 50 U/L (ref 38–126)
Anion gap: 5 (ref 5–15)
BUN: 13 mg/dL (ref 8–23)
CO2: 30 mmol/L (ref 22–32)
Calcium: 8.6 mg/dL — ABNORMAL LOW (ref 8.9–10.3)
Chloride: 106 mmol/L (ref 98–111)
Creatinine: 0.87 mg/dL (ref 0.44–1.00)
GFR, Estimated: >60 mL/min (ref >60)
Glucose, Bld: 87 mg/dL (ref 70–99)
Potassium: 4.1 mmol/L (ref 3.5–5.1)
Sodium: 141 mmol/L (ref 135–145)
Total Bilirubin: 0.9 mg/dL (ref 0.0–1.2)
Total Protein: 5.8 g/dL — ABNORMAL LOW (ref 6.5–8.1)

## 2023-09-14 NOTE — Progress Notes (Signed)
 [facility] [phone]   [phone]  OFFICE PROGRESS NOTE  Sylvan Evener, MD [address] [place] [address]  DIAGNOSIS: Multiple myeloma IgG subtype initially diagnosed as plasmacytoma of the proximal right humerus diagnosed in February 2022 with full-blown multiple myeloma in November 2024.  PRIOR THERAPY:  1) Palliative radiotherapy to the plasmacytoma of the proximal right humerus under the care of Dr. Eloise Hake. 2) Status post tumor resection from the proximal humerus with reconstruction under the care of Dr. Read Camel at [facility] on May 01, 2021.  CURRENT THERAPY: Systemic treatment with subcutaneous daratumumab , subcutaneous Velcade  on weekly basis, Revlimid  25 mg p.o. for 14 days every 3 weeks in addition to Decadron  20 mg p.o. weekly during the course of chemotherapy.  First dose May 27, 2023.  Status post 5 cycles.  INTERVAL HISTORY: Joanna Ray 73 y.o. female returns to the clinic today for follow-up visit. Discussed the use of AI scribe software for clinical note transcription with the patient, who gave verbal consent to proceed.  History of Present Illness   Joanna Ray is a 73 year old female with multiple myeloma who presents for evaluation before starting cycle six of her treatment.  Diagnosed with multiple myeloma, IgG subtype, in November 2024, she is currently undergoing treatment with subcutaneous daratumumab , subcutaneous Velcade , Revlimid , and Decadron . She has completed five cycles of this regimen and is here for evaluation before starting the sixth cycle.  She experiences bilateral ankle swelling, causing discomfort when walking. She works four hours a day, mostly sitting, and elevates her legs at the end of the day to alleviate the swelling. She inquires if the swelling could be related to her medication, particularly the steroids she takes weekly.  She is out of her potassium  medication and is awaiting lab results to determine if additional potassium is needed.  Overall, she feels fine with no new complaints aside from the ankle swelling.         MEDICAL HISTORY: Past Medical History:  Diagnosis Date   Anxiety    Arthritis    Cancer (HCC) 2021   multiple myloma Right Arm   Cataract    GERD (gastroesophageal reflux disease)    Hyperlipidemia    Hypertension    Urticaria     ALLERGIES:  is allergic to corn-containing products and shellfish allergy.  MEDICATIONS:  Current Outpatient Medications  Medication Sig Dispense Refill   acyclovir  (ZOVIRAX ) 400 MG tablet Take 1 tablet (400 mg total) by mouth 2 (two) times daily. 60 tablet 5   ALPRAZolam  (XANAX ) 0.5 MG tablet Take 1 tablet by mouth twice daily as needed for anxiety 60 tablet 5   apixaban  (ELIQUIS ) 2.5 MG TABS tablet Take 1 tablet (2.5 mg total) by mouth 2 (two) times daily. 60 tablet 2   aspirin  81 MG tablet Take 81 mg by mouth daily.     cholecalciferol  (VITAMIN D ) 1000 UNITS tablet Take 1,000 Units by mouth daily.     cyclobenzaprine  (FLEXERIL ) 10 MG tablet Take 1 tablet by mouth three times daily as needed for muscle spasm 90 tablet 0   dexamethasone  (DECADRON ) 4 MG tablet Take 5 tabs (20 mg) weekly the day after daratumumab  for 12 weeks. Take with breakfast. 20 tablet 5   EPINEPHrine  (EPIPEN  2-PAK) 0.3 mg/0.3 mL IJ SOAJ injection Inject 0.3 mg into the muscle as needed for anaphylaxis. 1 each PRN   fexofenadine  (ALLEGRA ) 180 MG tablet Take 1  tablet (180 mg total) by mouth daily. 90 tablet 1   HYDROcodone -acetaminophen  (NORCO/VICODIN) 5-325 MG tablet Take 1 tablet by mouth every 6 (six) hours as needed for moderate pain (pain score 4-6). 30 tablet 0   hydrocortisone valerate cream (WESTCORT) 0.2 % 1 app     lenalidomide  (REVLIMID ) 25 MG capsule TAKE 1 CAPSULE BY MOUTH EVERY DAY FOR 14 DAYS, TAKE 7 DAYS OFF 14 capsule 0   meclizine  (ANTIVERT ) 25 MG tablet Take 1 tablet (25 mg total) by mouth 3  (three) times daily as needed for dizziness. 30 tablet 0   ondansetron  (ZOFRAN ) 8 MG tablet Take 1 tablet (8 mg total) by mouth every 8 (eight) hours as needed for nausea or vomiting. 30 tablet 1   potassium chloride  SA (KLOR-CON  M) 20 MEQ tablet Take 1 tablet (20 mEq total) by mouth 2 (two) times daily. 16 tablet 0   prochlorperazine  (COMPAZINE ) 10 MG tablet TAKE 1 TABLET BY MOUTH EVERY 6 HOURS AS NEEDED FOR NAUSEA FOR VOMITING 30 tablet 0   rosuvastatin  (CRESTOR ) 20 MG tablet TAKE 1/2 TABLET ONE TIME DAILY 45 tablet 3   valsartan -hydrochlorothiazide  (DIOVAN -HCT) 160-25 MG tablet TAKE 1 TABLET EVERY DAY 90 tablet 3   vitamin E  100 UNIT capsule Take 100 Units by mouth daily.     No current facility-administered medications for this visit.    SURGICAL HISTORY:  Past Surgical History:  Procedure Laterality Date   CATARACT EXTRACTION, BILATERAL Bilateral    DILATION AND CURETTAGE OF UTERUS      REVIEW OF SYSTEMS:  A comprehensive review of systems was negative except for: Constitutional: positive for fatigue   PHYSICAL EXAMINATION: General appearance: alert, cooperative, and no distress Head: Normocephalic, without obvious abnormality, atraumatic Neck: no adenopathy, no JVD, supple, symmetrical, trachea midline, and thyroid  not enlarged, symmetric, no tenderness/mass/nodules Lymph nodes: Cervical, supraclavicular, and axillary nodes normal. Resp: clear to auscultation bilaterally Back: symmetric, no curvature. ROM normal. No CVA tenderness. Cardio: regular rate and rhythm, S1, S2 normal, no murmur, click, rub or gallop GI: soft, non-tender; bowel sounds normal; no masses,  no organomegaly Extremities: edema trace edema bilateral  ECOG PERFORMANCE STATUS: 1 - Symptomatic but completely ambulatory  Blood pressure (!) 150/81, pulse 69, temperature 98.3 F (36.8 C), temperature source Temporal, resp. rate 17, height 5\' 5"  (1.651 m), weight 191 lb 8 oz (86.9 kg), SpO2 100%.  LABORATORY  DATA: Lab Results  Component Value Date   WBC 3.4 (L) 08/31/2023   HGB 9.0 (L) 08/31/2023   HCT 26.3 (L) 08/31/2023   MCV 90.1 08/31/2023   PLT 132 (L) 08/31/2023      Chemistry      Component Value Date/Time   NA 142 08/31/2023 0919   NA 145 (H) 12/28/2018 1436   K 3.6 08/31/2023 0919   CL 108 08/31/2023 0919   CO2 30 08/31/2023 0919   BUN 24 (H) 08/31/2023 0919   BUN 15 12/28/2018 1436   CREATININE 1.20 (H) 08/31/2023 0919   CREATININE 1.42 (H) 07/22/2023 0956      Component Value Date/Time   CALCIUM  8.1 (L) 08/31/2023 0919   ALKPHOS 44 08/31/2023 0919   AST 16 08/31/2023 0919   ALT 15 08/31/2023 0919   BILITOT 1.3 (H) 08/31/2023 0919       RADIOGRAPHIC STUDIES: No results found.   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  The patient had  extensive work-up for multiple myeloma including a bone marrow biopsy and aspirate that showed only 6% plasma cells.  Her protein studies still suspicious for underlying MGUS/multiple myeloma but the findings are not enough to call it multiple myeloma at this point. The patient underwent radiotherapy to the plasmacytoma of the proximal right humerus and tolerated the procedure fairly well.  She is currently on observation.  The skeletal bone survey showed no other lytic bone lesion except the proximal right humerus. The patient was treated with palliative radiotherapy in the past but recent skeletal bone survey showed progression of the lytic lesion in the proximal humeral diaphysis with pathologic fracture.  She underwent surgical resection of the tumor with reconstruction under the care of Dr. Read Camel at [facility] on May 01, 2021.   The patient was found on recent protein study to have elevated IgG level.  She underwent a bone marrow biopsy and aspirate that showed 10-20% plasma cells. She had skeletal bone  survey performed recently that showed new lytic lesions in the proximal left femoral shaft and midshaft of the right femur consistent with myelomatous involvement or other lytic metastasis.  There was also scattered areas of endosteal scalloping in the left humeral shaft concerning for myelomatous involvement. The patient started systemic treatment with subcutaneous daratumumab , subcutaneous Velcade , Revlimid  and Decadron  every 3 weeks cycle on May 27, 2023.  She status post 5 cycles and tolerated the first cycle of her treatment fairly well. She will start her treatment with Zometa for the bone disease after we received the clearance from her dentist.    Multiple myeloma, IgG subtype Diagnosed in November 2024, currently undergoing treatment with subcutaneous daratumumab , subcutaneous Velcade , Revlimid , and Decadron . Completed five cycles and preparing for the sixth cycle. Evaluated as a good candidate for autologous stem cell transplant by Dr. Bernetta Brilliant at [facility]. She has been informed about the transplant procedure and received detailed information from Dr. Magdaline Schools team. - Proceed with the sixth cycle of daratumumab , Velcade , Revlimid , and Decadron . - Coordinate with Dr. Bernetta Brilliant for timing of autologous stem cell transplant. - Check labs today at 2:00 PM to assess current status. - Reschedule chemotherapy infusion for tomorrow and confirm timing with her.  Edema due to steroid use Bilateral ankle swelling, likely due to steroid use as part of multiple myeloma treatment. Swelling is uncomfortable when walking, and she reports sitting most of the time during work. Elevation of legs provides some relief. - Consider elevating legs to reduce swelling.   The patient was advised to call immediately if she has any concerning symptoms in the interval.  The patient voices understanding of current disease status and treatment options and is in agreement with the  current care plan.  All questions were answered. The patient knows to call the clinic with any problems, questions or concerns. We can certainly see the patient much sooner if necessary.  The total time spent in the appointment was 20 minutes.  Disclaimer: This note was dictated with voice recognition software. Similar sounding words can inadvertently be transcribed and may not be corrected upon review.

## 2023-09-14 NOTE — Telephone Encounter (Signed)
 Husband will try to contact pt re r/s her appts for another day.

## 2023-09-15 ENCOUNTER — Inpatient Hospital Stay

## 2023-09-15 VITALS — BP 121/60 | HR 67 | Temp 98.6°F | Resp 14

## 2023-09-15 DIAGNOSIS — C9000 Multiple myeloma not having achieved remission: Secondary | ICD-10-CM

## 2023-09-15 DIAGNOSIS — Z5112 Encounter for antineoplastic immunotherapy: Secondary | ICD-10-CM | POA: Diagnosis not present

## 2023-09-15 MED ORDER — ACETAMINOPHEN 325 MG PO TABS
650.0000 mg | ORAL_TABLET | Freq: Once | ORAL | Status: AC
Start: 2023-09-15 — End: 2023-09-15
  Administered 2023-09-15: 650 mg via ORAL
  Filled 2023-09-15: qty 2

## 2023-09-15 MED ORDER — DIPHENHYDRAMINE HCL 25 MG PO CAPS
50.0000 mg | ORAL_CAPSULE | Freq: Once | ORAL | Status: AC
  Administered 2023-09-15: 50 mg via ORAL
  Filled 2023-09-15: qty 2

## 2023-09-15 MED ORDER — BORTEZOMIB CHEMO SQ INJECTION 3.5 MG (2.5MG/ML)
1.3000 mg/m2 | Freq: Once | INTRAMUSCULAR | Status: AC
  Administered 2023-09-15: 2.75 mg via SUBCUTANEOUS
  Filled 2023-09-15: qty 1.1

## 2023-09-15 MED ORDER — DARATUMUMAB-HYALURONIDASE-FIHJ 1800-30000 MG-UT/15ML ~~LOC~~ SOLN
1800.0000 mg | Freq: Once | SUBCUTANEOUS | Status: AC
  Administered 2023-09-15: 1800 mg via SUBCUTANEOUS
  Filled 2023-09-15: qty 15

## 2023-09-15 MED ORDER — DEXAMETHASONE 4 MG PO TABS
20.0000 mg | ORAL_TABLET | Freq: Once | ORAL | Status: AC
Start: 2023-09-15 — End: 2023-09-15
  Administered 2023-09-15: 20 mg via ORAL
  Filled 2023-09-15: qty 5

## 2023-09-15 MED ORDER — ZOLEDRONIC ACID 4 MG/100ML IV SOLN
4.0000 mg | Freq: Once | INTRAVENOUS | Status: AC
  Administered 2023-09-15: 4 mg via INTRAVENOUS
  Filled 2023-09-15: qty 100

## 2023-09-15 MED ORDER — SODIUM CHLORIDE 0.9 % IV SOLN
INTRAVENOUS | Status: DC
Start: 2023-09-15 — End: 2023-09-15

## 2023-09-15 NOTE — Patient Instructions (Signed)
 [facility] - A DEPT OF . [facility]  Discharge Instructions: Thank you for choosing [facility] to provide your oncology and hematology care.   If you have a lab appointment with the Cancer Center, please go directly to the Cancer Center and check in at the registration area.   Wear comfortable clothing and clothing appropriate for easy access to any Portacath or PICC line.   We strive to give you quality time with your provider. You may need to reschedule your appointment if you arrive late (15 or more minutes).  Arriving late affects you and other patients whose appointments are after yours.  Also, if you miss three or more appointments without notifying the office, you may be dismissed from the clinic at the provider's discretion.      For prescription refill requests, have your pharmacy contact our office and allow 72 hours for refills to be completed.    Today you received the following chemotherapy and/or immunotherapy agents: Velcade , Darzalex  Faspro      To help prevent nausea and vomiting after your treatment, we encourage you to take your nausea medication as directed.  BELOW ARE SYMPTOMS THAT SHOULD BE REPORTED IMMEDIATELY: *FEVER GREATER THAN 100.4 F (38 C) OR HIGHER *CHILLS OR SWEATING *NAUSEA AND VOMITING THAT IS NOT CONTROLLED WITH YOUR NAUSEA MEDICATION *UNUSUAL SHORTNESS OF BREATH *UNUSUAL BRUISING OR BLEEDING *URINARY PROBLEMS (pain or burning when urinating, or frequent urination) *BOWEL PROBLEMS (unusual diarrhea, constipation, pain near the anus) TENDERNESS IN MOUTH AND THROAT WITH OR WITHOUT PRESENCE OF ULCERS (sore throat, sores in mouth, or a toothache) UNUSUAL RASH, SWELLING OR PAIN  UNUSUAL VAGINAL DISCHARGE OR ITCHING   Items with * indicate a potential emergency and should be followed up as soon as possible or go to the Emergency Department if any problems should occur.  Please show the CHEMOTHERAPY ALERT CARD or  IMMUNOTHERAPY ALERT CARD at check-in to the Emergency Department and triage nurse.  Should you have questions after your visit or need to cancel or reschedule your appointment, please contact [facility] - A DEPT OF [facility]  Dept: [phone]  and follow the prompts.  Office hours are 8:00 a.m. to 4:30 p.m. Monday - Friday. Please note that voicemails left after 4:00 p.m. may not be returned until the following business day.  We are closed weekends and major holidays. You have access to a nurse at all times for urgent questions. Please call the main number to the clinic Dept: [phone] and follow the prompts.   For any non-urgent questions, you may also contact your provider using MyChart. We now offer e-Visits for anyone 51 and older to request care online for non-urgent symptoms. For details visit [email or web address].   Also download the MyChart app! Go to the app store, search "MyChart", open the app, select [facility], and log in with your MyChart username and password.  Zoledronic  Acid Injection (Cancer) What is this medication? ZOLEDRONIC  ACID (ZOE le dron ik AS id) treats high calcium  levels in the blood caused by cancer. It may also be used with chemotherapy to treat weakened bones caused by cancer. It works by slowing down the release of calcium  from bones. This lowers calcium  levels in your blood. It also makes your bones stronger and less likely to break (fracture). It belongs to a group of medications called bisphosphonates. This medicine may be used for other purposes; ask your health  care provider or pharmacist if you have questions. COMMON BRAND NAME(S): Zometa , Zometa  Powder What should I tell my care team before I take this medication? They need to know if you have any of these conditions: Dehydration Dental disease Kidney disease Liver disease Low levels of calcium  in the blood Lung or breathing disease, such as asthma Receiving  steroids, such as dexamethasone  or prednisone An unusual or allergic reaction to zoledronic  acid, other medications, foods, dyes, or preservatives Pregnant or trying to get pregnant Breast-feeding How should I use this medication? This medication is injected into a vein. It is given by your care team in a hospital or clinic setting. Talk to your care team about the use of this medication in children. Special care may be needed. Overdosage: If you think you have taken too much of this medicine contact a poison control center or emergency room at once. NOTE: This medicine is only for you. Do not share this medicine with others. What if I miss a dose? Keep appointments for follow-up doses. It is important not to miss your dose. Call your care team if you are unable to keep an appointment. What may interact with this medication? Certain antibiotics given by injection Diuretics, such as bumetanide, furosemide NSAIDs, medications for pain and inflammation, such as ibuprofen or naproxen Teriparatide Thalidomide This list may not describe all possible interactions. Give your health care provider a list of all the medicines, herbs, non-prescription drugs, or dietary supplements you use. Also tell them if you smoke, drink alcohol, or use illegal drugs. Some items may interact with your medicine. What should I watch for while using this medication? Visit your care team for regular checks on your progress. It may be some time before you see the benefit from this medication. Some people who take this medication have severe bone, joint, or muscle pain. This medication may also increase your risk for jaw problems or a broken thigh bone. Tell your care team right away if you have severe pain in your jaw, bones, joints, or muscles. Tell you care team if you have any pain that does not go away or that gets worse. Tell your dentist and dental surgeon that you are taking this medication. You should not have major  dental surgery while on this medication. See your dentist to have a dental exam and fix any dental problems before starting this medication. Take good care of your teeth while on this medication. Make sure you see your dentist for regular follow-up appointments. You should make sure you get enough calcium  and vitamin D  while you are taking this medication. Discuss the foods you eat and the vitamins you take with your care team. Check with your care team if you have severe diarrhea, nausea, and vomiting, or if you sweat a lot. The loss of too much body fluid may make it dangerous for you to take this medication. You may need bloodwork while taking this medication. Talk to your care team if you wish to become pregnant or think you might be pregnant. This medication can cause serious birth defects. What side effects may I notice from receiving this medication? Side effects that you should report to your care team as soon as possible: Allergic reactions--skin rash, itching, hives, swelling of the face, lips, tongue, or throat Kidney injury--decrease in the amount of urine, swelling of the ankles, hands, or feet Low calcium  level--muscle pain or cramps, confusion, tingling, or numbness in the hands or feet Osteonecrosis of the jaw--pain, swelling,  or redness in the mouth, numbness of the jaw, poor healing after dental work, unusual discharge from the mouth, visible bones in the mouth Severe bone, joint, or muscle pain Side effects that usually do not require medical attention (report to your care team if they continue or are bothersome): Constipation Fatigue Fever Loss of appetite Nausea Stomach pain This list may not describe all possible side effects. Call your doctor for medical advice about side effects. You may report side effects to FDA at 1-800-FDA-1088. Where should I keep my medication? This medication is given in a hospital or clinic. It will not be stored at home. NOTE: This sheet is a  summary. It may not cover all possible information. If you have questions about this medicine, talk to your doctor, pharmacist, or health care provider.  2024 Elsevier/Gold Standard (2021-06-06 00:00:00)

## 2023-09-15 NOTE — Progress Notes (Signed)
 Per Dr Marguerita Shih, OK to give Zometa today despite Calcium  result & having crown today.  States he got dental clearance.

## 2023-09-20 ENCOUNTER — Other Ambulatory Visit: Payer: Self-pay | Admitting: Internal Medicine

## 2023-09-20 DIAGNOSIS — C9000 Multiple myeloma not having achieved remission: Secondary | ICD-10-CM

## 2023-09-21 ENCOUNTER — Other Ambulatory Visit: Payer: Medicare HMO

## 2023-09-21 ENCOUNTER — Other Ambulatory Visit: Payer: Self-pay | Admitting: Physician Assistant

## 2023-09-21 ENCOUNTER — Inpatient Hospital Stay: Payer: Medicare HMO

## 2023-09-21 ENCOUNTER — Encounter: Payer: Self-pay | Admitting: Internal Medicine

## 2023-09-21 ENCOUNTER — Ambulatory Visit: Payer: Medicare HMO | Admitting: Internal Medicine

## 2023-09-21 ENCOUNTER — Inpatient Hospital Stay

## 2023-09-21 VITALS — BP 151/69 | HR 61 | Temp 98.3°F | Resp 16 | Wt 185.5 lb

## 2023-09-21 DIAGNOSIS — Z5112 Encounter for antineoplastic immunotherapy: Secondary | ICD-10-CM | POA: Diagnosis not present

## 2023-09-21 DIAGNOSIS — C9000 Multiple myeloma not having achieved remission: Secondary | ICD-10-CM

## 2023-09-21 DIAGNOSIS — G893 Neoplasm related pain (acute) (chronic): Secondary | ICD-10-CM

## 2023-09-21 DIAGNOSIS — E876 Hypokalemia: Secondary | ICD-10-CM

## 2023-09-21 LAB — CMP (CANCER CENTER ONLY)
ALT: 14 U/L (ref 0–44)
AST: 16 U/L (ref 15–41)
Albumin: 3.4 g/dL — ABNORMAL LOW (ref 3.5–5.0)
Alkaline Phosphatase: 42 U/L (ref 38–126)
Anion gap: 7 (ref 5–15)
BUN: 11 mg/dL (ref 8–23)
CO2: 31 mmol/L (ref 22–32)
Calcium: 7.3 mg/dL — ABNORMAL LOW (ref 8.9–10.3)
Chloride: 104 mmol/L (ref 98–111)
Creatinine: 0.93 mg/dL (ref 0.44–1.00)
GFR, Estimated: >60 mL/min (ref >60)
Glucose, Bld: 101 mg/dL — ABNORMAL HIGH (ref 70–99)
Potassium: 3 mmol/L — ABNORMAL LOW (ref 3.5–5.1)
Sodium: 142 mmol/L (ref 135–145)
Total Bilirubin: 1.1 mg/dL (ref 0.0–1.2)
Total Protein: 5.4 g/dL — ABNORMAL LOW (ref 6.5–8.1)

## 2023-09-21 LAB — CBC WITH DIFFERENTIAL (CANCER CENTER ONLY)
Abs Immature Granulocytes: 0.15 10*3/uL — ABNORMAL HIGH (ref 0.00–0.07)
Basophils Absolute: 0 10*3/uL (ref 0.0–0.1)
Basophils Relative: 1 %
Eosinophils Absolute: 0 10*3/uL (ref 0.0–0.5)
Eosinophils Relative: 1 %
HCT: 28.4 % — ABNORMAL LOW (ref 36.0–46.0)
Hemoglobin: 9.6 g/dL — ABNORMAL LOW (ref 12.0–15.0)
Immature Granulocytes: 4 %
Lymphocytes Relative: 12 %
Lymphs Abs: 0.5 10*3/uL — ABNORMAL LOW (ref 0.7–4.0)
MCH: 30.2 pg (ref 26.0–34.0)
MCHC: 33.8 g/dL (ref 30.0–36.0)
MCV: 89.3 fL (ref 80.0–100.0)
Monocytes Absolute: 0.9 10*3/uL (ref 0.1–1.0)
Monocytes Relative: 23 %
Neutro Abs: 2.3 10*3/uL (ref 1.7–7.7)
Neutrophils Relative %: 59 %
Platelet Count: 123 10*3/uL — ABNORMAL LOW (ref 150–400)
RBC: 3.18 MIL/uL — ABNORMAL LOW (ref 3.87–5.11)
RDW: 14.9 % (ref 11.5–15.5)
WBC Count: 4 10*3/uL (ref 4.0–10.5)
nRBC: 0 % (ref 0.0–0.2)

## 2023-09-21 MED ORDER — BORTEZOMIB CHEMO SQ INJECTION 3.5 MG (2.5MG/ML)
1.3000 mg/m2 | Freq: Once | INTRAMUSCULAR | Status: AC
  Administered 2023-09-21: 2.75 mg via SUBCUTANEOUS
  Filled 2023-09-21: qty 1.1

## 2023-09-21 MED ORDER — PROCHLORPERAZINE MALEATE 10 MG PO TABS
10.0000 mg | ORAL_TABLET | Freq: Once | ORAL | Status: AC
  Administered 2023-09-21: 10 mg via ORAL
  Filled 2023-09-21: qty 1

## 2023-09-21 MED ORDER — POTASSIUM CHLORIDE CRYS ER 20 MEQ PO TBCR: 20.0000 meq | EXTENDED_RELEASE_TABLET | Freq: Two times a day (BID) | ORAL | 0 refills | Status: DC

## 2023-09-21 NOTE — Patient Instructions (Signed)
 [facility] - A DEPT OF [facility]  Discharge Instructions: Thank you for choosing [facility] to provide your oncology and hematology care.   If you have a lab appointment with the Cancer Center, please go directly to the Cancer Center and check in at the registration area.   Wear comfortable clothing and clothing appropriate for easy access to any Portacath or PICC line.   We strive to give you quality time with your provider. You may need to reschedule your appointment if you arrive late (15 or more minutes).  Arriving late affects you and other patients whose appointments are after yours.  Also, if you miss three or more appointments without notifying the office, you may be dismissed from the clinic at the provider's discretion.      For prescription refill requests, have your pharmacy contact our office and allow 72 hours for refills to be completed.    Today you received the following chemotherapy and/or immunotherapy agents: Velcade    To help prevent nausea and vomiting after your treatment, we encourage you to take your nausea medication as directed.  BELOW ARE SYMPTOMS THAT SHOULD BE REPORTED IMMEDIATELY: *FEVER GREATER THAN 100.4 F (38 C) OR HIGHER *CHILLS OR SWEATING *NAUSEA AND VOMITING THAT IS NOT CONTROLLED WITH YOUR NAUSEA MEDICATION *UNUSUAL SHORTNESS OF BREATH *UNUSUAL BRUISING OR BLEEDING *URINARY PROBLEMS (pain or burning when urinating, or frequent urination) *BOWEL PROBLEMS (unusual diarrhea, constipation, pain near the anus) TENDERNESS IN MOUTH AND THROAT WITH OR WITHOUT PRESENCE OF ULCERS (sore throat, sores in mouth, or a toothache) UNUSUAL RASH, SWELLING OR PAIN  UNUSUAL VAGINAL DISCHARGE OR ITCHING   Items with * indicate a potential emergency and should be followed up as soon as possible or go to the Emergency Department if any problems should occur.  Please show the CHEMOTHERAPY ALERT CARD or IMMUNOTHERAPY  ALERT CARD at check-in to the Emergency Department and triage nurse.  Should you have questions after your visit or need to cancel or reschedule your appointment, please contact [facility] - A DEPT OF [facility]  Dept: [phone]  and follow the prompts.  Office hours are 8:00 a.m. to 4:30 p.m. Monday - Friday. Please note that voicemails left after 4:00 p.m. may not be returned until the following business day.  We are closed weekends and major holidays. You have access to a nurse at all times for urgent questions. Please call the main number to the clinic Dept: [phone] and follow the prompts.   For any non-urgent questions, you may also contact your provider using MyChart. We now offer e-Visits for anyone 69 and older to request care online for non-urgent symptoms. For details visit [email or web address].   Also download the MyChart app! Go to the app store, search "MyChart", open the app, select , and log in with your MyChart username and password.

## 2023-09-23 ENCOUNTER — Telehealth: Payer: Self-pay

## 2023-09-23 NOTE — Telephone Encounter (Signed)
 Spoke with Peterson Brandt at [facility] in regards to information on details of transplant, confirming current treatment cycle, and the last dates of treatment. Informed Peterson Brandt our office will return the call once spoken to Dr. Marguerita Shih.   Tried to contact [place] @ [phone] and LVM for return call.

## 2023-09-24 DIAGNOSIS — I083 Combined rheumatic disorders of mitral, aortic and tricuspid valves: Secondary | ICD-10-CM | POA: Diagnosis not present

## 2023-09-24 DIAGNOSIS — R001 Bradycardia, unspecified: Secondary | ICD-10-CM | POA: Diagnosis not present

## 2023-09-24 DIAGNOSIS — C9000 Multiple myeloma not having achieved remission: Secondary | ICD-10-CM | POA: Diagnosis not present

## 2023-09-24 DIAGNOSIS — Z7682 Awaiting organ transplant status: Secondary | ICD-10-CM | POA: Diagnosis not present

## 2023-09-24 DIAGNOSIS — Z01818 Encounter for other preprocedural examination: Secondary | ICD-10-CM | POA: Diagnosis not present

## 2023-09-27 ENCOUNTER — Emergency Department (HOSPITAL_BASED_OUTPATIENT_CLINIC_OR_DEPARTMENT_OTHER)
Admission: EM | Admit: 2023-09-27 | Discharge: 2023-09-28 | Disposition: A | Discharge status: Home / Self Care | Attending: Emergency Medicine | Admitting: Emergency Medicine

## 2023-09-27 ENCOUNTER — Other Ambulatory Visit: Payer: Self-pay

## 2023-09-27 ENCOUNTER — Other Ambulatory Visit: Payer: Self-pay | Admitting: Physician Assistant

## 2023-09-27 ENCOUNTER — Ambulatory Visit (HOSPITAL_COMMUNITY): Admission: EM | Admit: 2023-09-27 | Discharge: 2023-09-27 | Discharge status: Against Medical Advice

## 2023-09-27 ENCOUNTER — Emergency Department (HOSPITAL_BASED_OUTPATIENT_CLINIC_OR_DEPARTMENT_OTHER)

## 2023-09-27 ENCOUNTER — Telehealth: Payer: Self-pay

## 2023-09-27 ENCOUNTER — Encounter: Payer: Self-pay | Admitting: Internal Medicine

## 2023-09-27 ENCOUNTER — Encounter (HOSPITAL_BASED_OUTPATIENT_CLINIC_OR_DEPARTMENT_OTHER): Payer: Self-pay

## 2023-09-27 DIAGNOSIS — M79604 Pain in right leg: Secondary | ICD-10-CM | POA: Diagnosis not present

## 2023-09-27 DIAGNOSIS — C9000 Multiple myeloma not having achieved remission: Secondary | ICD-10-CM | POA: Diagnosis not present

## 2023-09-27 DIAGNOSIS — M7989 Other specified soft tissue disorders: Secondary | ICD-10-CM

## 2023-09-27 DIAGNOSIS — E876 Hypokalemia: Secondary | ICD-10-CM | POA: Insufficient documentation

## 2023-09-27 DIAGNOSIS — Z7982 Long term (current) use of aspirin: Secondary | ICD-10-CM | POA: Insufficient documentation

## 2023-09-27 DIAGNOSIS — M79605 Pain in left leg: Secondary | ICD-10-CM | POA: Insufficient documentation

## 2023-09-27 DIAGNOSIS — R600 Localized edema: Secondary | ICD-10-CM | POA: Insufficient documentation

## 2023-09-27 DIAGNOSIS — D72819 Decreased white blood cell count, unspecified: Secondary | ICD-10-CM | POA: Insufficient documentation

## 2023-09-27 DIAGNOSIS — Z7901 Long term (current) use of anticoagulants: Secondary | ICD-10-CM | POA: Insufficient documentation

## 2023-09-27 DIAGNOSIS — Z79899 Other long term (current) drug therapy: Secondary | ICD-10-CM | POA: Diagnosis not present

## 2023-09-27 DIAGNOSIS — I10 Essential (primary) hypertension: Secondary | ICD-10-CM | POA: Insufficient documentation

## 2023-09-27 LAB — BASIC METABOLIC PANEL WITH GFR
Anion gap: 12 (ref 5–15)
BUN: 16 mg/dL (ref 8–23)
CO2: 25 mmol/L (ref 22–32)
Calcium: 8.2 mg/dL — ABNORMAL LOW (ref 8.9–10.3)
Chloride: 108 mmol/L (ref 98–111)
Creatinine, Ser: 0.9 mg/dL (ref 0.44–1.00)
GFR, Estimated: >60 mL/min (ref >60)
Glucose, Bld: 88 mg/dL (ref 70–99)
Potassium: 3.3 mmol/L — ABNORMAL LOW (ref 3.5–5.1)
Sodium: 145 mmol/L (ref 135–145)

## 2023-09-27 LAB — CBC WITH DIFFERENTIAL/PLATELET
Abs Immature Granulocytes: 0.02 10*3/uL (ref 0.00–0.07)
Basophils Absolute: 0 10*3/uL (ref 0.0–0.1)
Basophils Relative: 1 %
Eosinophils Absolute: 0.1 10*3/uL (ref 0.0–0.5)
Eosinophils Relative: 3 %
HCT: 29.7 % — ABNORMAL LOW (ref 36.0–46.0)
Hemoglobin: 9.6 g/dL — ABNORMAL LOW (ref 12.0–15.0)
Immature Granulocytes: 1 %
Lymphocytes Relative: 26 %
Lymphs Abs: 0.7 10*3/uL (ref 0.7–4.0)
MCH: 30.3 pg (ref 26.0–34.0)
MCHC: 32.3 g/dL (ref 30.0–36.0)
MCV: 93.7 fL (ref 80.0–100.0)
Monocytes Absolute: 0.8 10*3/uL (ref 0.1–1.0)
Monocytes Relative: 30 %
Neutro Abs: 1.1 10*3/uL — ABNORMAL LOW (ref 1.7–7.7)
Neutrophils Relative %: 39 %
Platelets: 134 10*3/uL — ABNORMAL LOW (ref 150–400)
RBC: 3.17 MIL/uL — ABNORMAL LOW (ref 3.87–5.11)
RDW: 15.8 % — ABNORMAL HIGH (ref 11.5–15.5)
WBC: 2.7 10*3/uL — ABNORMAL LOW (ref 4.0–10.5)
nRBC: 0 % (ref 0.0–0.2)

## 2023-09-27 LAB — MAGNESIUM: Magnesium: 1.9 mg/dL (ref 1.7–2.4)

## 2023-09-27 LAB — PRO BRAIN NATRIURETIC PEPTIDE: Pro Brain Natriuretic Peptide: 176 pg/mL (ref <300.0)

## 2023-09-27 MED ORDER — POTASSIUM CHLORIDE CRYS ER 20 MEQ PO TBCR
20.0000 meq | EXTENDED_RELEASE_TABLET | Freq: Once | ORAL | Status: AC
  Administered 2023-09-27: 20 meq via ORAL
  Filled 2023-09-27: qty 1

## 2023-09-27 NOTE — Discharge Instructions (Addendum)
 Please follow-up with your oncologist in clinic tomorrow for further evaluation.  Your laboratory workup was reassuring and your ultrasound was negative for blood clot in your legs.  Your potassium was mildly low and was replenished orally.

## 2023-09-27 NOTE — ED Triage Notes (Signed)
 Pt reports bilateral numbness and swelling in feet and legs x3 weeks. Pt denies any hx of CHF or diabetes. Pt reports just finishing chemo x1 week ago. Pt denies any SOB.

## 2023-09-27 NOTE — Telephone Encounter (Signed)
 Received a voicemail from patient reporting numbness and swelling in both feet, along with tingling in both hands.   Returned call and spoke with patient this afternoon. Patient stated, "It hurts so bad when I walk." She reports both feet are swollen, red, and warm to the touch.  Information relayed to [place], [place]. Cassie recommends patient be evaluated at an urgent care facility. She has placed an order for a DVT study to be performed tomorrow if deemed necessary after urgent care evaluation. Patient voiced understanding of the plan and confirmed she will proceed with urgent care evaluation.

## 2023-09-27 NOTE — ED Notes (Signed)
 Pt states she is going to hospital

## 2023-09-27 NOTE — Telephone Encounter (Signed)
 Spoke with Peterson Brandt and confirmed the patient's last treatment date as 09/21/23. Peterson Brandt stated she will contact the patient regarding the last dose of Revlimid . Informed her that the last Revlimid  prescription was sent on 08/24/23. Advised her to contact our office with any additional needs or questions.

## 2023-09-27 NOTE — ED Provider Notes (Signed)
 [place] EMERGENCY DEPARTMENT AT [facility] Provider Note   CSN: [id number] Arrival date & time: 09/27/23  1758     History  Chief Complaint  Patient presents with   Leg Swelling    Joanna Ray is a 73 y.o. female.  HPI   73 year old female with medical history significant for HTN, HLD, anxiety, GERD, multiple myeloma on chemotherapy who presents to the emergency department with numbness and swelling in her feet and legs bilaterally for the last 3 weeks.  Denies any history of CHF, denies any history of diabetes.  She just finished a round of chemotherapy 1 week ago.  She has follow-up with her oncologist tomorrow morning.  She denies any shortness of breath or chest pain.  Her oncologist recommended that she be evaluated in the ER to evaluate for possible DVT.  Home Medications Prior to Admission medications   Medication Sig Start Date End Date Taking? Authorizing Provider  acyclovir  (ZOVIRAX ) 400 MG tablet Take 1 tablet (400 mg total) by mouth 2 (two) times daily. 05/20/23   Marlene Simas, MD  ALPRAZolam  (XANAX ) 0.5 MG tablet Take 1 tablet by mouth twice daily as needed for anxiety 05/27/22   Sylvan Evener, MD  apixaban  (ELIQUIS ) 2.5 MG TABS tablet Take 1 tablet (2.5 mg total) by mouth 2 (two) times daily. 06/08/23   Heilingoetter, Cassandra L, PA-C  aspirin  81 MG tablet Take 81 mg by mouth daily.    [provider]  cholecalciferol  (VITAMIN D ) 1000 UNITS tablet Take 1,000 Units by mouth daily.    [provider]  cyclobenzaprine  (FLEXERIL ) 10 MG tablet Take 1 tablet by mouth three times daily as needed for muscle spasm 05/20/23   Sylvan Evener, MD  dexamethasone  (DECADRON ) 4 MG tablet Take 5 tabs (20 mg) weekly the day after daratumumab  for 12 weeks. Take with breakfast. 05/20/23   Marlene Simas, MD  EPINEPHrine  (EPIPEN  2-PAK) 0.3 mg/0.3 mL IJ SOAJ injection Inject 0.3 mg into the muscle as needed for anaphylaxis. 07/14/22   Sylvan Evener, MD   fexofenadine  (ALLEGRA ) 180 MG tablet Take 1 tablet (180 mg total) by mouth daily. 06/29/19   Brian Campanile, MD  HYDROcodone -acetaminophen  (NORCO/VICODIN) 5-325 MG tablet Take 1 tablet by mouth every 6 (six) hours as needed for moderate pain (pain score 4-6). 08/24/23   Heilingoetter, Cassandra L, PA-C  hydrocortisone valerate cream (WESTCORT) 0.2 % 1 app    [provider]  lenalidomide  (REVLIMID ) 25 MG capsule TAKE 1 CAPSULE BY MOUTH EVERY DAY FOR 14 DAYS, TAKE 7 DAYS OFF 08/24/23   Marlene Simas, MD  meclizine  (ANTIVERT ) 25 MG tablet Take 1 tablet (25 mg total) by mouth 3 (three) times daily as needed for dizziness. 01/02/23   Charmayne Cooper, MD  ondansetron  (ZOFRAN ) 8 MG tablet Take 1 tablet (8 mg total) by mouth every 8 (eight) hours as needed for nausea or vomiting. 05/20/23   Marlene Simas, MD  potassium chloride  SA (KLOR-CON  M) 20 MEQ tablet Take 1 tablet (20 mEq total) by mouth 2 (two) times daily. 09/21/23   Heilingoetter, Cassandra L, PA-C  prochlorperazine  (COMPAZINE ) 10 MG tablet TAKE 1 TABLET BY MOUTH EVERY 6 HOURS AS NEEDED FOR NAUSEA FOR VOMITING 08/23/23   Marlene Simas, MD  rosuvastatin  (CRESTOR ) 20 MG tablet TAKE 1/2 TABLET ONE TIME DAILY 02/20/23   Sylvan Evener, MD  valsartan -hydrochlorothiazide  (DIOVAN -HCT) 160-25 MG tablet TAKE 1 TABLET EVERY DAY 03/10/23   Baxley, Jaynie Meyers, MD  vitamin E   100 UNIT capsule Take 100 Units by mouth daily.    [provider]      Allergies    Corn-containing products and Shellfish allergy    Review of Systems   Review of Systems  All other systems reviewed and are negative.   Physical Exam Updated Vital Signs BP (!) 106/52 (BP Location: Left Arm)   Pulse (!) 57   Temp 98 F (36.7 C) (Oral)   Resp 15   Ht 5\' 5"  (1.651 m)   Wt 83.9 kg   SpO2 100%   BMI 30.79 kg/m  Physical Exam Vitals and nursing note reviewed.  Constitutional:      General: She is not in acute distress.    Appearance: She is  well-developed.  HENT:     Head: Normocephalic and atraumatic.  Eyes:     Conjunctiva/sclera: Conjunctivae normal.  Cardiovascular:     Rate and Rhythm: Normal rate and regular rhythm.     Heart sounds: No murmur heard. Pulmonary:     Effort: Pulmonary effort is normal. No respiratory distress.     Breath sounds: Normal breath sounds.     Comments: Lungs CTAB, no wheezing, rales or rhonchi Abdominal:     Palpations: Abdomen is soft.     Tenderness: There is no abdominal tenderness.  Musculoskeletal:        General: Swelling present.     Cervical back: Neck supple.     Right lower leg: Edema present.     Left lower leg: Edema present.     Comments: Trace bilateral pitting edema  Skin:    General: Skin is warm and dry.     Capillary Refill: Capillary refill takes less than 2 seconds.  Neurological:     Mental Status: She is alert.  Psychiatric:        Mood and Affect: Mood normal.     ED Results / Procedures / Treatments   Labs (all labs ordered are listed, but only abnormal results are displayed) Labs Reviewed  CBC WITH DIFFERENTIAL/PLATELET - Abnormal; Notable for the following components:      Result Value   WBC 2.7 (*)    RBC 3.17 (*)    Hemoglobin 9.6 (*)    HCT 29.7 (*)    RDW 15.8 (*)    Platelets 134 (*)    Neutro Abs 1.1 (*)    All other components within normal limits  BASIC METABOLIC PANEL WITH GFR - Abnormal; Notable for the following components:   Potassium 3.3 (*)    Calcium  8.2 (*)    All other components within normal limits  PRO BRAIN NATRIURETIC PEPTIDE  MAGNESIUM    EKG None  Radiology US  Venous Img Lower Bilateral Result Date: 09/27/2023 EXAM: ULTRASOUND DUPLEX OF THE BILATERAL LOWER EXTREMITY VEINS TECHNIQUE: Duplex ultrasound using B-mode/gray scaled imaging and Doppler spectral analysis and color flow was obtained of the deep venous structures of the bilateral lower extremity. COMPARISON: None. CLINICAL HISTORY: Leg swelling. FINDINGS: The  visualized veins of the lower extremity are patent and free of echogenic thrombus. The veins demonstrate good compressibility with normal color flow study and spectral analysis. IMPRESSION: 1. No evidence of DVT. Electronically signed by: Zadie Herter MD 09/27/2023 11:42 PM EDT RP Workstation: [id number]    Procedures Procedures    Medications Ordered in ED Medications  potassium chloride  SA (KLOR-CON  M) CR tablet 20 mEq (20 mEq Oral Given 09/27/23 2234)    ED Course/ Medical Decision Making/ A&P  Medical Decision Making Amount and/or Complexity of Data Reviewed Labs: ordered.  Risk Prescription drug management.    73 year old female with medical history significant for HTN, HLD, anxiety, GERD, multiple myeloma on chemotherapy who presents to the emergency department with numbness and swelling in her feet and legs bilaterally for the last 3 weeks.  Denies any history of CHF, denies any history of diabetes.  She just finished a round of chemotherapy 1 week ago.  She has follow-up with her oncologist tomorrow morning.  She denies any shortness of breath or chest pain.  Her oncologist recommended that she be evaluated in the ER to evaluate for possible DVT.  On arrival, the patient was vitally stable, afebrile, not tachycardic or tachypneic, BP 129/76, saturating 99% on room air.  Physical exam revealed lungs CTAB, trace bilateral pitting edema bilaterally.  Symptoms consistent with likely mild fluid overload, considered new onset heart failure in the setting of chemotherapy, considered side effect of chemotherapy regimen, less likely bilateral DVT, no evidence of cellulitis on exam.  Workup initiated, laboratory evaluation revealed BNP normal, CBC with a leukopenia to 2.7, mild Nimi to 9.6, BMP with mild hypokalemia 3.3, replenished orally, magnesium normal at 1.9, normal renal function.  DVT ultrasound performed bilaterally with no evidence of DVT.   Patient has no chest pain, no shortness of breath, denies any orthopnea.  Patient is overall well-appearing and has follow-up with her oncologist tomorrow, I recommended that she continue to discuss her ongoing symptoms with her oncology physician, no indication for hospitalization at this time as the patient is overall well-appearing, does not appear significantly volume overloaded, no respiratory stress, saturating well on room air.  Final Clinical Impression(s) / ED Diagnoses Final diagnoses:  Leg swelling  Pain in both lower extremities  Hypokalemia    Rx / DC Orders ED Discharge Orders     None         Rosealee Concha, MD 09/28/23 1550

## 2023-09-27 NOTE — ED Notes (Signed)
Spoke with lab about magnesium add-on.

## 2023-09-28 DIAGNOSIS — Z01818 Encounter for other preprocedural examination: Secondary | ICD-10-CM | POA: Diagnosis not present

## 2023-09-28 DIAGNOSIS — C9000 Multiple myeloma not having achieved remission: Secondary | ICD-10-CM | POA: Diagnosis not present

## 2023-09-28 DIAGNOSIS — Z0189 Encounter for other specified special examinations: Secondary | ICD-10-CM | POA: Diagnosis not present

## 2023-09-28 DIAGNOSIS — Z7682 Awaiting organ transplant status: Secondary | ICD-10-CM | POA: Diagnosis not present

## 2023-09-30 ENCOUNTER — Telehealth: Payer: Self-pay | Admitting: Medical Oncology

## 2023-09-30 NOTE — Telephone Encounter (Signed)
 Spoke to Naomi @ [facility] and told her pts Revlimid  is discontinued due to pending transplant. Leon Rajas said she  will leave her "activated" in [facility] programfor now even if Revlimid  is discontinued.

## 2023-10-01 DIAGNOSIS — Z7682 Awaiting organ transplant status: Secondary | ICD-10-CM | POA: Diagnosis not present

## 2023-10-01 DIAGNOSIS — Z79899 Other long term (current) drug therapy: Secondary | ICD-10-CM | POA: Diagnosis not present

## 2023-10-01 DIAGNOSIS — Z131 Encounter for screening for diabetes mellitus: Secondary | ICD-10-CM | POA: Diagnosis not present

## 2023-10-01 DIAGNOSIS — R937 Abnormal findings on diagnostic imaging of other parts of musculoskeletal system: Secondary | ICD-10-CM | POA: Diagnosis not present

## 2023-10-01 DIAGNOSIS — M7989 Other specified soft tissue disorders: Secondary | ICD-10-CM | POA: Diagnosis not present

## 2023-10-01 DIAGNOSIS — Z01818 Encounter for other preprocedural examination: Secondary | ICD-10-CM | POA: Diagnosis not present

## 2023-10-01 DIAGNOSIS — C9000 Multiple myeloma not having achieved remission: Secondary | ICD-10-CM | POA: Diagnosis not present

## 2023-10-01 DIAGNOSIS — Z87891 Personal history of nicotine dependence: Secondary | ICD-10-CM | POA: Diagnosis not present

## 2023-10-02 DIAGNOSIS — R9389 Abnormal findings on diagnostic imaging of other specified body structures: Secondary | ICD-10-CM | POA: Diagnosis not present

## 2023-10-02 DIAGNOSIS — Z7682 Awaiting organ transplant status: Secondary | ICD-10-CM | POA: Diagnosis not present

## 2023-10-02 DIAGNOSIS — Z01818 Encounter for other preprocedural examination: Secondary | ICD-10-CM | POA: Diagnosis not present

## 2023-10-02 DIAGNOSIS — C9000 Multiple myeloma not having achieved remission: Secondary | ICD-10-CM | POA: Diagnosis not present

## 2023-10-04 DIAGNOSIS — C9000 Multiple myeloma not having achieved remission: Secondary | ICD-10-CM | POA: Diagnosis not present

## 2023-10-04 DIAGNOSIS — M19011 Primary osteoarthritis, right shoulder: Secondary | ICD-10-CM | POA: Diagnosis not present

## 2023-10-04 DIAGNOSIS — Z471 Aftercare following joint replacement surgery: Secondary | ICD-10-CM | POA: Diagnosis not present

## 2023-10-04 DIAGNOSIS — Z96611 Presence of right artificial shoulder joint: Secondary | ICD-10-CM | POA: Diagnosis not present

## 2023-10-04 DIAGNOSIS — Z01818 Encounter for other preprocedural examination: Secondary | ICD-10-CM | POA: Diagnosis not present

## 2023-10-04 DIAGNOSIS — Z7682 Awaiting organ transplant status: Secondary | ICD-10-CM | POA: Diagnosis not present

## 2023-10-04 DIAGNOSIS — M84521D Pathological fracture in neoplastic disease, right humerus, subsequent encounter for fracture with routine healing: Secondary | ICD-10-CM | POA: Diagnosis not present

## 2023-10-06 ENCOUNTER — Other Ambulatory Visit: Payer: Self-pay | Admitting: Medical Oncology

## 2023-10-07 DIAGNOSIS — C9000 Multiple myeloma not having achieved remission: Secondary | ICD-10-CM | POA: Diagnosis not present

## 2023-10-07 DIAGNOSIS — Z7682 Awaiting organ transplant status: Secondary | ICD-10-CM | POA: Diagnosis not present

## 2023-10-07 DIAGNOSIS — Z01818 Encounter for other preprocedural examination: Secondary | ICD-10-CM | POA: Diagnosis not present

## 2023-10-07 DIAGNOSIS — Z87891 Personal history of nicotine dependence: Secondary | ICD-10-CM | POA: Diagnosis not present

## 2023-10-11 DIAGNOSIS — Z7682 Awaiting organ transplant status: Secondary | ICD-10-CM | POA: Diagnosis not present

## 2023-10-11 DIAGNOSIS — Z01818 Encounter for other preprocedural examination: Secondary | ICD-10-CM | POA: Diagnosis not present

## 2023-10-11 DIAGNOSIS — C9000 Multiple myeloma not having achieved remission: Secondary | ICD-10-CM | POA: Diagnosis not present

## 2023-10-12 ENCOUNTER — Ambulatory Visit: Payer: Self-pay

## 2023-10-12 ENCOUNTER — Telehealth: Payer: Self-pay | Admitting: Medical Oncology

## 2023-10-12 NOTE — Telephone Encounter (Signed)
 FYI Only or Action Required?: FYI only for provider  Patient was last seen in primary care on 07/23/2023 by Sylvan Evener, MD. Called Nurse Triage reporting Foot Swelling. Symptoms began about a month ago. Interventions attempted: Nothing. Symptoms are: gradually worsening.  Triage Disposition: See PCP When Office is Open (Within 3 Days)  Patient/caregiver understands and will follow disposition?: Yes  Copied from CRM [id number]. Topic: Clinical - Red Word Triage >> Oct 12, 2023  4:46 PM Ethelle Herb L wrote: Red Word that prompted transfer to Nurse Triage: swelling and pain in both feet Reason for Disposition  [1] MODERATE pain (e.g., interferes with normal activities, limping) AND [2] present > 3 days  [1] MILD swelling of both ankles (i.e., pedal edema) AND [2] new-onset or worsening  Answer Assessment - Initial Assessment Questions 1. ONSET: When did the swelling start? (e.g., minutes, hours, days)     X month and worsening 2. LOCATION: What part of the leg is swollen?  Are both legs swollen or just one leg?     Bilateral swelling from toes to knees 3. SEVERITY: How bad is the swelling? (e.g., localized; mild, moderate, severe)   - Localized: Small area of swelling localized to one leg.   - MILD pedal edema: Swelling limited to foot and ankle, pitting edema < 1/4 inch (6 mm) deep, rest and elevation eliminate most or all swelling.   - MODERATE edema: Swelling of lower leg to knee, pitting edema > 1/4 inch (6 mm) deep, rest and elevation only partially reduce swelling.   - SEVERE edema: Swelling extends above knee, facial or hand swelling present.      Moderate to severe 4. REDNESS: Does the swelling look red or infected?     Bilateral soles of feet are red: no drainage noted 5. PAIN: Is the swelling painful to touch? If Yes, ask: How painful is it?   (Scale 1-10; mild, moderate or severe)     Moderate to severe depending on the amount of swelling 6. FEVER: Do you have a fever?  If Yes, ask: What is it, how was it measured, and when did it start?      Bilateral legs warm touch 7. CAUSE: What do you think is causing the leg swelling?     unknown 8. MEDICAL HISTORY: Do you have a history of blood clots (e.g., DVT), cancer, heart failure, kidney disease, or liver failure?     N/a 9. RECURRENT SYMPTOM: Have you had leg swelling before? If Yes, ask: When was the last time? What happened that time?     N/a 10. OTHER SYMPTOMS: Do you have any other symptoms? (e.g., chest pain, difficulty breathing)      Bilateral numbness & tungling 11. PREGNANCY: Is there any chance you are pregnant? When was your last menstrual period?       N/a  Answer Assessment - Initial Assessment Questions 1. ONSET: When did the pain start?      X month and worsening 2. LOCATION: Where is the pain located?      Bilateral toes to knees 3. PAIN: How bad is the pain?    (Scale 1-10; or mild, moderate, severe)   -  MILD (1-3): doesn't interfere with normal activities    -  MODERATE (4-7): interferes with normal activities (e.g., work or school) or awakens from sleep, limping    -  SEVERE (8-10): excruciating pain, unable to do any normal activities, unable to walk     Mild to moderate:  depending on amount of swelling 4. WORK OR EXERCISE: Has there been any recent work or exercise that involved this part of the body?      N/a 5. CAUSE: What do you think is causing the leg pain?     unknown 6. OTHER SYMPTOMS: Do you have any other symptoms? (e.g., chest pain, back pain, breathing difficulty, swelling, rash, fever, numbness, weakness)     Numbness, tingling, swelling, some redness on soles of feet & warmth to touch 7. PREGNANCY: Is there any chance you are pregnant? When was your last menstrual period?     N/a  Protocols used: Leg Swelling and Edema-A-AH, Leg Pain-A-AH

## 2023-10-12 NOTE — Telephone Encounter (Addendum)
 The patient reports persistent bilateral foot pain, particularly exacerbated by walking. She describes the pain as a burning sensation and states that even light contact, such as bedsheets touching her feet, is intolerable.  The patient also reports ongoing swelling on both the dorsal and plantar aspects of her feet, accompanied by redness. She indicates the pain is severe enough to significantly impair her mobility, necessitating the use of a cane for ambulation.  The patient was evaluated in the Emergency Department two weeks ago for similar symptoms, with deep vein thrombosis (DVT) ruled out at that time.  Her last treatment was on 05/27, but she is currently not receiving further therapy as she is under the care of Dr. Bernetta Brilliant at [facility] for transplant evaluation.  Next appointment is scheduled for 06/23.  Per Cassie's guidance, the patient was instructed to contact her primary care provider for further management.

## 2023-10-13 ENCOUNTER — Telehealth: Payer: Self-pay | Admitting: Medical Oncology

## 2023-10-13 NOTE — Telephone Encounter (Signed)
 Pt notified of new appt times for Monday.

## 2023-10-14 ENCOUNTER — Other Ambulatory Visit: Payer: Self-pay

## 2023-10-14 ENCOUNTER — Ambulatory Visit: Admitting: Internal Medicine

## 2023-10-14 VITALS — BP 162/98 | HR 61 | Temp 98.5°F | Ht 65.0 in | Wt 183.4 lb

## 2023-10-14 DIAGNOSIS — Z9484 Stem cells transplant status: Secondary | ICD-10-CM | POA: Diagnosis not present

## 2023-10-14 DIAGNOSIS — C9000 Multiple myeloma not having achieved remission: Secondary | ICD-10-CM

## 2023-10-14 DIAGNOSIS — R7989 Other specified abnormal findings of blood chemistry: Secondary | ICD-10-CM | POA: Diagnosis not present

## 2023-10-14 DIAGNOSIS — D649 Anemia, unspecified: Secondary | ICD-10-CM

## 2023-10-14 DIAGNOSIS — Z8579 Personal history of other malignant neoplasms of lymphoid, hematopoietic and related tissues: Secondary | ICD-10-CM | POA: Diagnosis not present

## 2023-10-14 DIAGNOSIS — R202 Paresthesia of skin: Secondary | ICD-10-CM | POA: Diagnosis not present

## 2023-10-14 DIAGNOSIS — I10 Essential (primary) hypertension: Secondary | ICD-10-CM

## 2023-10-14 DIAGNOSIS — G620 Drug-induced polyneuropathy: Secondary | ICD-10-CM | POA: Diagnosis not present

## 2023-10-14 DIAGNOSIS — R600 Localized edema: Secondary | ICD-10-CM

## 2023-10-14 DIAGNOSIS — G609 Hereditary and idiopathic neuropathy, unspecified: Secondary | ICD-10-CM | POA: Diagnosis not present

## 2023-10-14 MED ORDER — FUROSEMIDE 20 MG PO TABS: ORAL_TABLET | ORAL | 1 refills | Status: DC

## 2023-10-14 NOTE — Progress Notes (Signed)
 Patient Care Team: Sylvan Evener, MD as PCP - General (Internal Medicine)  Visit Date: 10/14/23  Subjective:   Chief Complaint  Patient presents with   Leg Swelling    Both legs and both feet. Started about a month ago. Trying soaking in epsom salt doesn't help with swelling, elevating legs does help. Wakes up a little swollen but less than throughout the day.    Foot Swelling   Vitals:   10/14/23 1140 10/14/23 1205  BP: (!) 178/96 (!) 162/98   Patient ZO:XWRUEA Joanna Ray, File DOB:[birth year],73 y.o. VWU:[id number]   73 y.o.Female presents today for visit with Bilateral Leg Swelling/Pain. Patient has a past medical history of Arthritis; Multiple Myeloma; Hypertension; Angioedema. Says that swelling began 1 month ago, not relieved with epsom salt soaks, is relieved with elevation - wakes up with mild swelling that worsens throughout the day as she works for 4 hours but when she gets home she'll elevated her legs. She also mentions having a burning sensation in the bottoms of her feet when walking, on exam sensation intact w/o any skin breakdown visualized. She was seen in the ED on 6/02 with the same symptoms as recommended by her Oncologist to r/o DVT. In ED it was noted symptoms consistent w/ mild fluid overload. Heart Failure considered, BNP was normal and there was not evidence of DVT. On 6/03 was evaluated and cleared for Stem Cell Transplant taking place 10/31/2023, however I do not see any mention of her LE edema in that note.   History of Hypertension treated with Valsartan -HCTZ 160-25 mg daily, which she says that she took today ~2 hours prior to appointment. Blood Pressure: elevated today at 178/96, 25 minutes later 162/98.   History of Multiple Myeloma treated with chemotherapy, which she recently completed treatment on 5/27, and is followed by Oncology.   Past Medical History:  Diagnosis Date   Anxiety    Arthritis    Cancer (HCC) 2021   multiple myloma Right Arm    Cataract    GERD (gastroesophageal reflux disease)    Hyperlipidemia    Hypertension    Urticaria     Allergies  Allergen Reactions   Corn-Containing Products Swelling    Rash    Shellfish Allergy Swelling    Swelling of lips    Family History  Problem Relation Age of Onset   Stroke Mother    Stroke Father    Diabetes Sister    Breast cancer Sister 42   Ovarian cancer Maternal Grandmother 10   Colon cancer Neg Hx    Colon polyps Neg Hx    Esophageal cancer Neg Hx    Rectal cancer Neg Hx    Stomach cancer Neg Hx    Social Hx: Husband is retired. Pt has not smoked in over 20 years. She has adult children. No alcohol consumption. Formerly taught at [employer] Hx: Father died at age 72 of a stroke. Mother died at age 72 in 27 with hx of stroke.One brother with HTN and MI. One sister with Diabetes mellitus. 2 sisters have hypertension. She lost her son in 2019 due to an auto accident. Son had hypertension.  Review of Systems  Cardiovascular:  Positive for leg swelling (worsened throughout the day, relieved with elevation, began 1 month ago).  Musculoskeletal:        (+) Foot Pain - both dorsal and plantar, described burning sensation on plantar aspect     Objective:  Vitals: BP [name])  162/98   Pulse 61   Temp 98.5 F (36.9 C) (Axillary)   Ht 5' 5 (1.651 m)   Wt 183 lb 6.4 oz (83.2 kg)   SpO2 97%   BMI 30.52 kg/m   Physical Exam Vitals and nursing note reviewed.  Constitutional:      General: She is not in acute distress.    Appearance: Normal appearance. She is not toxic-appearing.  HENT:     Head: Normocephalic and atraumatic.   Cardiovascular:     Rate and Rhythm: Normal rate and regular rhythm. No extrasystoles are present.    Pulses: Normal pulses.          Dorsalis pedis pulses are 2+ on the right side.     Heart sounds: Normal heart sounds. No murmur heard.    No friction rub. No gallop.  Pulmonary:     Effort: Pulmonary  effort is normal. No respiratory distress.     Breath sounds: Normal breath sounds. No wheezing or rales.   Musculoskeletal:     Right lower leg: 1+ Pitting Edema (LEs) present.     Left lower leg: 1+ Pitting Edema present.  Feet:     Comments: Skin pink of plantar aspect at distal metatarsals.  Sensation Intact, Right Foot  Skin:    General: Skin is warm and dry.   Neurological:     Mental Status: She is alert and oriented to person, place, and time. Mental status is at baseline.   Psychiatric:        Mood and Affect: Mood normal.        Behavior: Behavior normal.        Thought Content: Thought content normal.        Judgment: Judgment normal.     Results:  Studies Obtained And Personally Reviewed By Me:  ULTRASOUND DUPLEX OF THE BILATERAL LOWER EXTREMITY VEINS  09/27/2023   FINDINGS: The visualized veins of the lower extremity are patent and free of echogenic thrombus. The veins demonstrate good compressibility with normal color flow study and spectral analysis.   IMPRESSION: 1. No evidence of DVT.   NUCLEAR MEDICINE PET WHOLE BODY  05/05/2023   COMPARISON:  Metastatic survey 03/22/2023   FINDINGS: Mediastinal blood pool activity: SUV max 2.96   HEAD/NECK: No hypermetabolic activity in the scalp. No hypermetabolic cervical lymph nodes.   Incidental CT findings: none   CHEST: No hypermetabolic mediastinal or hilar nodes. No hypermetabolic breast lesions. No axillary supraclavicular adenopathy. No suspicious pulmonary nodules on the CT scan.   Incidental CT findings: Scattered aortic calcifications.   ABDOMEN/PELVIS: No abnormal hypermetabolic activity within the liver, pancreas, adrenal glands, or spleen. No hypermetabolic lymph nodes in the abdomen or pelvis.   Incidental CT findings: none   SKELETON: Mild hypermetabolism around the right shoulder arthroplasty, not unexpected. There is a focus of hypermetabolism in the right lower humeral shaft  consistent with a myelomatous lesion. SUV max is 4.82.   Suspected small lytic lesions in the left humeral shaft but no associated hypermetabolism.   A lytic myelomatous lesion in the proximal left femoral shaft just below the lesser trochanter has an SUV max 5.66.   Three hypermetabolic myelomatous lesions in the right femoral shaft correlating with lytic lesions on the CT scan. The more proximal lesion has an SUV max of 6.51. The most distal lesion has an SUV max of 3.30 and the middle lesions has an SUV max of 5.27.   Hypermetabolic left sacral lesion has an SUV  max of 5.62.   Lytic destructive myelomatous lesion involving the right pedicle and transverse process of T7 has an SUV max of 4.30.   Small focus hypermetabolism in the left semimembranosus muscle without obvious lesion on the CT scan. Possible focus of hypermetabolic.   Incidental CT findings: Degenerative changes in the lumbar spine.   EXTREMITIES: Hypermetabolic foci in the extremities as detailed above.   Incidental CT findings: none   IMPRESSION: 1. Multiple hypermetabolic myelomatous lesions involving the right humeral shaft, bilateral femoral shafts, left sacrum and right pedicle and transverse process of T7.  2. Small hypermetabolic focus in the left semimembranosus muscle is indeterminate.  3. Aortic atherosclerosis.   Labs:     Component Value Date/Time   NA 145 09/27/2023 1814   NA 145 (H) 12/28/2018 1436   K 3.3 (L) 09/27/2023 1814   CL 108 09/27/2023 1814   CO2 25 09/27/2023 1814   GLUCOSE 88 09/27/2023 1814   BUN 16 09/27/2023 1814   BUN 15 12/28/2018 1436   CREATININE 0.90 09/27/2023 1814   CREATININE 0.93 09/21/2023 0909   CREATININE 1.42 (H) 07/22/2023 0956   CALCIUM  8.2 (L) 09/27/2023 1814   PROT 5.4 (L) 09/21/2023 0909   PROT 7.7 12/28/2018 1436   ALBUMIN 3.4 (L) 09/21/2023 0909   ALBUMIN 4.6 12/28/2018 1436   AST 16 09/21/2023 0909   ALT 14 09/21/2023 0909   ALKPHOS 42  09/21/2023 0909   BILITOT 1.1 09/21/2023 0909   GFRNONAA >60 09/27/2023 1814   GFRNONAA >60 09/21/2023 0909   GFRNONAA 50 (L) 09/03/2020 0909   GFRAA 58 (L) 09/03/2020 0909    Lab Results  Component Value Date   WBC 2.7 (L) 09/27/2023   HGB 9.6 (L) 09/27/2023   HCT 29.7 (L) 09/27/2023   MCV 93.7 09/27/2023   PLT 134 (L) 09/27/2023   Lab Results  Component Value Date   CHOL 187 07/22/2023   HDL 61 07/22/2023   LDLCALC 107 (H) 07/22/2023   TRIG 99 07/22/2023   CHOLHDL 3.1 07/22/2023   Lab Results  Component Value Date   HGBA1C 5.7 (H) 09/03/2020    Lab Results  Component Value Date   TSH 1.77 01/14/2023    Assessment & Plan:   Orders Placed This Encounter  Procedures   B12 and Folate Panel   Iron, TIBC and Ferritin Panel   VITAMIN D  25 Hydroxy (Vit-D Deficiency, Fractures)   Brain natriuretic peptide   Bilateral Leg Swelling: began 1 month ago, she'll wake up with mild swelling that worsens throughout the day as she works for 4 hours but when she gets home she'll elevate her legs to relieve swelling. She was seen in the ED on 6/02 with the same symptoms as recommended by her Oncologist to r/o DVT. LE Doppler was negative.  In ED it was noted symptoms consistent w/ mild fluid overload. Heart Failure considered, BNP was normal and there was not evidence of DVT.   On 6/03 was evaluated and cleared for Stem Cell Transplant taking place 10/31/2023, however I do not see any mention of her LE edema in that note. Ordering Iron studies with hx of anemia, Vitamin-D//B12 & Folate as well as and BNP .  Addendum: Vitamin D  is  low normal at 35.Patient would like to try High dose Vitamin D  weekly and this has been sent in. Mild iron deficiency at 43. Suggest iron supplement. Try iron sulfate 325 mb twice daily if tolerated.  BNP done due to leg swelling  and is still pending.  Paresthesias: Possible chemotherapy induced peripheral neuropathy regarding the mention of a burning sensation  in the bottoms of her feet when walking. On exam sensation intact w/o any skin breakdown of the foot visualized. Orders as above.   Hypertension treated with Valsartan -HCTZ 160-25 mg daily, which she says that she took today ~2 hours prior to appointment. Blood Pressure: elevated today at 178/96, 25 minutes later 162/98. Orders as above.  Multiple Myeloma treated with chemotherapy, which she recently completed treatment on 5/27, and is followed by Oncology. Normocytic Anemia on 10/04/2023 CBC w/ RBC 3.10, HGB 9.6, and Hematocrit 28.8. On 6/03 was evaluated and cleared for Stem Cell Transplant taking place 10/31/2023. Orders as above.        I,Emily Lagle,acting as a Neurosurgeon for Sylvan Evener, MD.,have documented all relevant documentation on the behalf of Sylvan Evener, MD,as directed by  Sylvan Evener, MD while in the presence of Sylvan Evener, MD.   I, Sylvan Evener, MD, have reviewed all documentation for this visit. The documentation on 10/15/23 for the exam, diagnosis, procedures, and orders are all accurate and complete.

## 2023-10-15 ENCOUNTER — Ambulatory Visit: Payer: Self-pay | Admitting: Internal Medicine

## 2023-10-15 DIAGNOSIS — C9000 Multiple myeloma not having achieved remission: Secondary | ICD-10-CM | POA: Diagnosis not present

## 2023-10-15 LAB — BRAIN NATRIURETIC PEPTIDE: Brain Natriuretic Peptide: 39 pg/mL (ref <100)

## 2023-10-15 LAB — IRON,TIBC AND FERRITIN PANEL
%SAT: 15 % — ABNORMAL LOW (ref 16–45)
Ferritin: 192 ng/mL (ref 16–288)
Iron: 43 ug/dL — ABNORMAL LOW (ref 45–160)
TIBC: 284 ug/dL (ref 250–450)

## 2023-10-15 LAB — VITAMIN D 25 HYDROXY (VIT D DEFICIENCY, FRACTURES): Vit D, 25-Hydroxy: 35 ng/mL (ref 30–100)

## 2023-10-15 LAB — B12 AND FOLATE PANEL
Folate: 7.3 ng/mL
Vitamin B-12: >2000 pg/mL — ABNORMAL HIGH (ref 200–1100)

## 2023-10-15 MED ORDER — ERGOCALCIFEROL 1.25 MG (50000 UT) PO CAPS: 50000.0000 [IU] | ORAL_CAPSULE | ORAL | 3 refills | Status: AC

## 2023-10-15 MED ORDER — ERGOCALCIFEROL 1.25 MG (50000 UT) PO CAPS: 50000.0000 [IU] | ORAL_CAPSULE | ORAL | 3 refills | Status: DC

## 2023-10-18 ENCOUNTER — Ambulatory Visit: Admitting: Physician Assistant

## 2023-10-18 ENCOUNTER — Encounter: Payer: Self-pay | Admitting: Internal Medicine

## 2023-10-18 ENCOUNTER — Ambulatory Visit (INDEPENDENT_AMBULATORY_CARE_PROVIDER_SITE_OTHER): Admitting: Internal Medicine

## 2023-10-18 ENCOUNTER — Other Ambulatory Visit

## 2023-10-18 ENCOUNTER — Inpatient Hospital Stay

## 2023-10-18 ENCOUNTER — Ambulatory Visit

## 2023-10-18 ENCOUNTER — Ambulatory Visit: Payer: Self-pay

## 2023-10-18 ENCOUNTER — Inpatient Hospital Stay: Attending: Internal Medicine

## 2023-10-18 VITALS — BP 160/100 | HR 74 | Temp 98.6°F | Ht 65.0 in | Wt 183.0 lb

## 2023-10-18 VITALS — BP 164/79 | HR 58 | Temp 98.3°F | Resp 18 | Wt 183.0 lb

## 2023-10-18 DIAGNOSIS — E876 Hypokalemia: Secondary | ICD-10-CM

## 2023-10-18 DIAGNOSIS — R600 Localized edema: Secondary | ICD-10-CM | POA: Diagnosis not present

## 2023-10-18 DIAGNOSIS — I10 Essential (primary) hypertension: Secondary | ICD-10-CM

## 2023-10-18 DIAGNOSIS — C9000 Multiple myeloma not having achieved remission: Secondary | ICD-10-CM | POA: Diagnosis not present

## 2023-10-18 DIAGNOSIS — G609 Hereditary and idiopathic neuropathy, unspecified: Secondary | ICD-10-CM | POA: Diagnosis not present

## 2023-10-18 DIAGNOSIS — Z8659 Personal history of other mental and behavioral disorders: Secondary | ICD-10-CM | POA: Diagnosis not present

## 2023-10-18 DIAGNOSIS — Z79899 Other long term (current) drug therapy: Secondary | ICD-10-CM | POA: Insufficient documentation

## 2023-10-18 DIAGNOSIS — M7989 Other specified soft tissue disorders: Secondary | ICD-10-CM

## 2023-10-18 LAB — CMP (CANCER CENTER ONLY)
ALT: 7 U/L (ref 0–44)
AST: 13 U/L — ABNORMAL LOW (ref 15–41)
Albumin: 4 g/dL (ref 3.5–5.0)
Alkaline Phosphatase: 46 U/L (ref 38–126)
Anion gap: 9 (ref 5–15)
BUN: 16 mg/dL (ref 8–23)
CO2: 30 mmol/L (ref 22–32)
Calcium: 9 mg/dL (ref 8.9–10.3)
Chloride: 106 mmol/L (ref 98–111)
Creatinine: 0.77 mg/dL (ref 0.44–1.00)
GFR, Estimated: >60 mL/min (ref >60)
Glucose, Bld: 104 mg/dL — ABNORMAL HIGH (ref 70–99)
Potassium: 2.8 mmol/L — ABNORMAL LOW (ref 3.5–5.1)
Sodium: 145 mmol/L (ref 135–145)
Total Bilirubin: 1.1 mg/dL (ref 0.0–1.2)
Total Protein: 6.1 g/dL — ABNORMAL LOW (ref 6.5–8.1)

## 2023-10-18 MED ORDER — ZOLEDRONIC ACID 4 MG/100ML IV SOLN
4.0000 mg | Freq: Once | INTRAVENOUS | Status: AC
  Administered 2023-10-18: 4 mg via INTRAVENOUS
  Filled 2023-10-18: qty 100

## 2023-10-18 MED ORDER — POTASSIUM CHLORIDE CRYS ER 20 MEQ PO TBCR: 20.0000 meq | EXTENDED_RELEASE_TABLET | Freq: Two times a day (BID) | ORAL | 2 refills | Status: AC

## 2023-10-18 MED ORDER — SODIUM CHLORIDE 0.9 % IV SOLN: INTRAVENOUS | Status: DC

## 2023-10-18 MED ORDER — OLMESARTAN MEDOXOMIL 20 MG PO TABS: 20.0000 mg | ORAL_TABLET | Freq: Every day | ORAL | 2 refills | Status: DC

## 2023-10-18 MED ORDER — GABAPENTIN 300 MG PO CAPS: 300.0000 mg | ORAL_CAPSULE | Freq: Three times a day (TID) | ORAL | 3 refills | Status: DC

## 2023-10-18 NOTE — Telephone Encounter (Signed)
 Spoke with patient this afternoon regarding lab results. Per Cassie, PA, patient's potassium level is low. Cassie noted that the patient's PCP has already sent in a potassium prescription today and the patient is aware, so no additional prescription is needed from our office. Encouraged patient to incorporate more potassium-rich foods into her diet. Patient voiced understanding.

## 2023-10-18 NOTE — Patient Instructions (Signed)

## 2023-10-18 NOTE — Progress Notes (Signed)
 Patient Care Team: [name], MD as PCP - General (Internal Medicine)  Visit Date: 10/18/23  Subjective:   Chief Complaint  Patient presents with   Follow-up    Taken off blood pressure and was given a water pill to take. Pain in feet, asking for something for pain.    Vitals:   10/18/23 1043 10/18/23 1103  BP: (!) 164/98 (!) 160/100   Patient PI:Amzwij I Voorhies,Female DOB:1950/11/29,73 y.o. FMW:[id number]   73 y.o.Female presents today for 4 day follow-up for LE Edema. Patient has a past medical history of Arthritis; Multiple Myeloma; Hypertension; Angioedema. She was seen here on 6/19 regarding LE Edema x 1 month, exacerbated by extended periods of standing/sitting with legs down and relieved with elevation. That day, she had Iron Panel, B12/Folate Panel, Vitamin-D, and BNP drawn with results immediately below. She says that the swelling has improved since starting Lasix  20 mg daily after June 19 visit here. Has been evaluated at [facility] and is a stem cell candidate for Multiple myeloma not in remission. This is anticipated to occur in early July.  Seen at [facility] today and potassium was low at 2.8. BNP was normal on October 14, 2023.  Iron Panel:  IRON 43 (L)  TIBC 284  FERRITIN 192  Says that she has looked into Bone Builder iron supplement to start  Vitamin-B12//Folate Panel: VITAMINB12 >2,000 (H)  FOLATE 7.3   Vitamin-D  35 Now taking Vitamin-D 50,000 units weekly.   BNP  39  History of Multiple Myeloma treated with chemotherapy, which she has completed a round on 5/27, and is followed by Oncologist, Dr. Sherrod. Today had C-MET drawn through the Cancer Center, with Potassium 2.8, Glucose 104, Total Protein 6.1, and AST 13, otherwise normal.   She also says that the burning sensation/pain in her feet is still occurring, and she can feel some of the same sensation in her fingertips.   History of Hypertension with Blood Pressure: elevated today at  164/98, 20 minutes later 160/100. Was discontinued from Valsartan -HCTZ since switching to Lasix . Recommend to continue monitoring blood pressures at home.   Past Medical History:  Diagnosis Date   Anxiety    Arthritis    Cancer (HCC) 2021   multiple myloma Right Arm   Cataract    GERD (gastroesophageal reflux disease)    Hyperlipidemia    Hypertension    Urticaria     Allergies  Allergen Reactions   Corn-Containing Products Swelling    Rash    Shellfish Allergy Swelling    Swelling of lips    Family History  Problem Relation Age of Onset   Stroke Mother    Stroke Father    Diabetes Sister    Breast cancer Sister 38   Ovarian cancer Maternal Grandmother 40   Colon cancer Neg Hx    Colon polyps Neg Hx    Esophageal cancer Neg Hx    Rectal cancer Neg Hx    Stomach cancer Neg Hx    Social History   Social History Narrative   Not on file   Review of Systems  Cardiovascular:  Positive for leg swelling (has improved, still occurs some over time).     Objective:  Vitals: BP (!) 160/100 (BP Location: Right Arm, Patient Position: Sitting)   Pulse 74   Temp 98.6 F (37 C) (Temporal)   Ht 5' 5 (1.651 m)   Wt 183 lb (83 kg)   SpO2 99%   BMI 30.45 kg/m  Physical Exam Vitals and nursing note reviewed.  Constitutional:      General: She is not in acute distress.    Appearance: Normal appearance. She is not toxic-appearing.  HENT:     Head: Normocephalic and atraumatic.   Cardiovascular:     Rate and Rhythm: Normal rate and regular rhythm. No extrasystoles are present.    Pulses: Normal pulses.     Heart sounds: Normal heart sounds. No murmur heard.    No friction rub. No gallop.  Pulmonary:     Effort: Pulmonary effort is normal. No respiratory distress.     Breath sounds: Normal breath sounds. No wheezing or rales.   Musculoskeletal:     Right lower leg: No edema.     Left lower leg: No edema.   Skin:    General: Skin is warm and dry.   Neurological:      Mental Status: She is alert and oriented to person, place, and time. Mental status is at baseline.   Psychiatric:        Mood and Affect: Mood normal.        Behavior: Behavior normal.        Thought Content: Thought content normal.        Judgment: Judgment normal.     Results:  Studies Obtained And Personally Reviewed By Me: Labs:     Component Value Date/Time   NA 145 10/18/2023 0805   NA 145 (H) 12/28/2018 1436   K 2.8 (L) 10/18/2023 0805   CL 106 10/18/2023 0805   CO2 30 10/18/2023 0805   GLUCOSE 104 (H) 10/18/2023 0805   BUN 16 10/18/2023 0805   BUN 15 12/28/2018 1436   CREATININE 0.77 10/18/2023 0805   CREATININE 1.42 (H) 07/22/2023 0956   CALCIUM  9.0 10/18/2023 0805   PROT 6.1 (L) 10/18/2023 0805   PROT 7.7 12/28/2018 1436   ALBUMIN 4.0 10/18/2023 0805   ALBUMIN 4.6 12/28/2018 1436   AST 13 (L) 10/18/2023 0805   ALT 7 10/18/2023 0805   ALKPHOS 46 10/18/2023 0805   BILITOT 1.1 10/18/2023 0805   GFRNONAA >60 10/18/2023 0805   GFRNONAA 50 (L) 09/03/2020 0909   GFRAA 58 (L) 09/03/2020 0909    Lab Results  Component Value Date   WBC 2.7 (L) 09/27/2023   HGB 9.6 (L) 09/27/2023   HCT 29.7 (L) 09/27/2023   MCV 93.7 09/27/2023   PLT 134 (L) 09/27/2023   Lab Results  Component Value Date   CHOL 187 07/22/2023   HDL 61 07/22/2023   LDLCALC 107 (H) 07/22/2023   TRIG 99 07/22/2023   CHOLHDL 3.1 07/22/2023   Lab Results  Component Value Date   HGBA1C 5.7 (H) 09/03/2020    Lab Results  Component Value Date   TSH 1.77 01/14/2023    Results for orders placed or performed in visit on 10/18/23  CMP (Cancer Center only)  Result Value Ref Range   Sodium 145 135 - 145 mmol/L   Potassium 2.8 (L) 3.5 - 5.1 mmol/L   Chloride 106 98 - 111 mmol/L   CO2 30 22 - 32 mmol/L   Glucose, Bld 104 (H) 70 - 99 mg/dL   BUN 16 8 - 23 mg/dL   Creatinine 9.22 9.55 - 1.00 mg/dL   Calcium  9.0 8.9 - 10.3 mg/dL   Total Protein 6.1 (L) 6.5 - 8.1 g/dL   Albumin 4.0 3.5 - 5.0 g/dL    AST 13 (L) 15 - 41 U/L   ALT  7 0 - 44 U/L   Alkaline Phosphatase 46 38 - 126 U/L   Total Bilirubin 1.1 0.0 - 1.2 mg/dL   GFR, Estimated >39 >39 mL/min   Anion gap 9 5 - 15   Iron Panel:  IRON 43 (L)  TIBC 284  FERRITIN 192  Says that she has looked into Bone Builder iron supplement to start  Vitamin-B12//Folate Panel: VITAMINB12 >2,000 (H)  FOLATE 7.3   Vitamin-D  35 Now taking Vitamin-D 50,000 units weekly.   BNP  39 Assessment & Plan:   Meds ordered this encounter  Medications   gabapentin (NEURONTIN) 300 MG capsule    Sig: Take 1 capsule (300 mg total) by mouth 3 (three) times daily.    Dispense:  90 capsule    Refill:  3   potassium chloride  SA (KLOR-CON  M) 20 MEQ tablet    Sig: Take 1 tablet (20 mEq total) by mouth 2 (two) times daily.    Dispense:  60 tablet    Refill:  2   olmesartan  (BENICAR ) 20 MG tablet    Sig: Take 1 tablet (20 mg total) by mouth daily.    Dispense:  30 tablet    Refill:  2   Lower Extremity Edema initially seen on 6/19 regarding LE Edema x 1 month, exacerbated by extended periods of standing/sitting with legs down and relieved with elevation. That day she had Iron Panel, B12/Folate Panel, Vitamin-D, and BNP drawn with results above. Swelling has improved since starting Lasix  20 mg daily. Aggravated by steroids.  Multiple Myeloma treated with chemotherapy, which she has completed a round on 5/27, and is followed by Oncologist, Dr. Gatha. Today had C-MET drawn through the Cancer Center, with Potassium 2.8, Glucose 104, Total Protein 6.1, and AST 13, otherwise normal. Stem cell candidate and being seen at [facility]. IgG subtype  Hx plasmacytoma right humerus treated by Dr. Gatha  Hypokalemia: Potassium 2.8 on today's CMET. Sending in Potassium Chloride  20 meq to take twice daily. Needs follow up in about a week- will be at [facility] then.  Paresthesia: says that the burning sensation/pain in her feet is still occurring, and she can feel some of  the same sensation in her fingertips. Will send in Gabapentin 300 mg to take three times daily.   Hypertension with Blood Pressure: elevated today at 164/98, 20 minutes later 160/100. Was discontinued from Valsartan -HCTZ since switching to Lasix . Sending in Olmesartan  20 mg to take daily. Recommend to continue monitoring blood pressures at home. Follow up in 4 weeks or sooner if needed.      I,Emily Lagle,acting as a Neurosurgeon for Ronal JINNY Hailstone, MD.,have documented all relevant documentation on the behalf of Ronal JINNY Hailstone, MD,as directed by  Ronal JINNY Hailstone, MD while in the presence of Ronal JINNY Hailstone, MD.   I, Ronal JINNY Hailstone, MD, have reviewed all documentation for this visit. The documentation on 10/18/23 for the exam, diagnosis, procedures, and orders are all accurate and complete.

## 2023-10-18 NOTE — Patient Instructions (Addendum)
 Have started pt on  oral potassium twice daily and will need follow up in about a week. She has appt at [facility] around that time. Can have potassium checked there. Call back here if has questions or concerns. Continue Lasix  therapy for dependent edema.For paresthesias in feet, start Gabapentin 3 times daily. Take olmesartan  20 mg daily for hypertension. Valsartan -hydrochlorothiazide  discontinued. Olmesartan  will spare potassium.

## 2023-10-19 DIAGNOSIS — Z52011 Autologous donor, stem cells: Secondary | ICD-10-CM | POA: Diagnosis not present

## 2023-10-19 DIAGNOSIS — C9000 Multiple myeloma not having achieved remission: Secondary | ICD-10-CM | POA: Diagnosis not present

## 2023-10-19 DIAGNOSIS — Z7682 Awaiting organ transplant status: Secondary | ICD-10-CM | POA: Diagnosis not present

## 2023-10-22 ENCOUNTER — Encounter: Payer: Self-pay | Admitting: Internal Medicine

## 2023-10-22 NOTE — Patient Instructions (Addendum)
 Please take High dose Vitamin D  weekly. BNP drawn today for hx of leg swelling. Keep legs elevated. Continue Valsartan -hydrochlorothiazide  daily. Gabapentin  may be of help with peripheral neuropathy.

## 2023-10-25 ENCOUNTER — Emergency Department (HOSPITAL_BASED_OUTPATIENT_CLINIC_OR_DEPARTMENT_OTHER)
Admission: EM | Admit: 2023-10-25 | Discharge: 2023-10-26 | Disposition: A | Discharge status: Home / Self Care | Attending: Emergency Medicine | Admitting: Emergency Medicine

## 2023-10-25 ENCOUNTER — Emergency Department (HOSPITAL_BASED_OUTPATIENT_CLINIC_OR_DEPARTMENT_OTHER): Admitting: Radiology

## 2023-10-25 ENCOUNTER — Other Ambulatory Visit: Payer: Self-pay

## 2023-10-25 ENCOUNTER — Encounter (HOSPITAL_BASED_OUTPATIENT_CLINIC_OR_DEPARTMENT_OTHER): Payer: Self-pay

## 2023-10-25 DIAGNOSIS — Z96611 Presence of right artificial shoulder joint: Secondary | ICD-10-CM | POA: Diagnosis not present

## 2023-10-25 DIAGNOSIS — Y848 Other medical procedures as the cause of abnormal reaction of the patient, or of later complication, without mention of misadventure at the time of the procedure: Secondary | ICD-10-CM | POA: Insufficient documentation

## 2023-10-25 DIAGNOSIS — T82838A Hemorrhage of vascular prosthetic devices, implants and grafts, initial encounter: Secondary | ICD-10-CM | POA: Insufficient documentation

## 2023-10-25 DIAGNOSIS — Z7682 Awaiting organ transplant status: Secondary | ICD-10-CM | POA: Diagnosis not present

## 2023-10-25 DIAGNOSIS — I700 Atherosclerosis of aorta: Secondary | ICD-10-CM | POA: Diagnosis not present

## 2023-10-25 DIAGNOSIS — Z452 Encounter for adjustment and management of vascular access device: Secondary | ICD-10-CM | POA: Diagnosis not present

## 2023-10-25 DIAGNOSIS — C9000 Multiple myeloma not having achieved remission: Secondary | ICD-10-CM | POA: Diagnosis not present

## 2023-10-25 DIAGNOSIS — Z52011 Autologous donor, stem cells: Secondary | ICD-10-CM | POA: Diagnosis not present

## 2023-10-25 NOTE — ED Provider Notes (Signed)
 [place] [facility]MENT AT [facility] Provider Note   CSN: [id number] Arrival date & time: 10/25/23  2137     Patient presents with: Vascular Access Problem   JAYLEENA STILLE is a 73 y.o. female.   Patient presents with concerns over bleeding from central line placed in her right chest earlier today.  Patient will be using the line for cancer treatment.       Prior to Admission medications   Medication Sig Start Date End Date Taking? Authorizing Provider  acyclovir  (ZOVIRAX ) 400 MG tablet Take 1 tablet (400 mg total) by mouth 2 (two) times daily. 05/20/23   Sherrod Sherrod, MD  ALPRAZolam  (XANAX ) 0.5 MG tablet Take 1 tablet by mouth twice daily as needed for anxiety 05/27/22   [name], MD  apixaban  (ELIQUIS ) 2.5 MG TABS tablet Take 1 tablet (2.5 mg total) by mouth 2 (two) times daily. 06/08/23   Heilingoetter, Cassandra L, PA-C  aspirin  81 MG tablet Take 81 mg by mouth daily. Patient not taking: Reported on 10/18/2023    [provider]  cholecalciferol  (VITAMIN D ) 1000 UNITS tablet Take 1,000 Units by mouth daily.    [provider]  cyclobenzaprine  (FLEXERIL ) 10 MG tablet Take 1 tablet by mouth three times daily as needed for muscle spasm 05/20/23   [name], MD  dexamethasone  (DECADRON ) 4 MG tablet Take 5 tabs (20 mg) weekly the day after daratumumab  for 12 weeks. Take with breakfast. 05/20/23   Sherrod Sherrod, MD  EPINEPHrine  (EPIPEN  2-PAK) 0.3 mg/0.3 mL IJ SOAJ injection Inject 0.3 mg into the muscle as needed for anaphylaxis. 07/14/22   [name], MD  ergocalciferol  (DRISDOL ) 1.25 MG (50000 UT) capsule Take 1 capsule (50,000 Units total) by mouth once a week. 10/15/23   [name], MD  fexofenadine  (ALLEGRA ) 180 MG tablet Take 1 tablet (180 mg total) by mouth daily. 06/29/19   Jeneal Danita Macintosh, MD  filgrastim-aafi (NIVESTYM) 300 MCG/0.5ML SOSY injection Inject 900 mcg into the skin daily. 10/22/23 10/29/23  [provider]  furosemide  (LASIX ) 20 MG tablet One tab by mouth daily for leg swelling 10/14/23   [name], MD  gabapentin  (NEURONTIN ) 300 MG capsule Take 1 capsule (300 mg total) by mouth 3 (three) times daily. 10/18/23   [name], MD  HYDROcodone -acetaminophen  (NORCO/VICODIN) 5-325 MG tablet Take 1 tablet by mouth every 6 (six) hours as needed for moderate pain (pain score 4-6). 08/24/23   Heilingoetter, Cassandra L, PA-C  hydrocortisone valerate cream (WESTCORT) 0.2 % 1 app    [provider]  meclizine  (ANTIVERT ) 25 MG tablet Take 1 tablet (25 mg total) by mouth 3 (three) times daily as needed for dizziness. 01/02/23   Roselyn Carlin NOVAK, MD  olmesartan  (BENICAR ) 20 MG tablet Take 1 tablet (20 mg total) by mouth daily. 10/18/23   [name], MD  ondansetron  (ZOFRAN ) 8 MG tablet Take 1 tablet (8 mg total) by mouth every 8 (eight) hours as needed for nausea or vomiting. 05/20/23   Sherrod Sherrod, MD  potassium chloride  SA (KLOR-CON  M) 20 MEQ tablet Take 1 tablet (20 mEq total) by mouth 2 (two) times daily. 10/18/23   [name], MD  prochlorperazine  (COMPAZINE ) 10 MG tablet TAKE 1 TABLET BY MOUTH EVERY 6 HOURS AS NEEDED FOR NAUSEA FOR VOMITING 08/23/23   Sherrod Sherrod, MD  rosuvastatin  (CRESTOR ) 20 MG tablet TAKE 1/2 TABLET ONE TIME DAILY 02/20/23   [name], MD  vitamin E  100 UNIT capsule Take 100 Units by mouth daily.    [provider]    Allergies: Corn-containing products and Shellfish allergy    Review of Systems  Updated Vital Signs BP (!) 176/75 (BP Location: Left Arm)   Pulse 83   Temp 98.2 F (36.8 C)   Resp 17   SpO2 97%   Physical Exam Vitals and nursing note reviewed.  Constitutional:      General: She is not in acute distress.    Appearance: She is well-developed.  HENT:     Head: Normocephalic and atraumatic.     Mouth/Throat:     Mouth: Mucous membranes are moist.   Eyes:     General: Vision grossly intact. Gaze aligned  appropriately.     Extraocular Movements: Extraocular movements intact.     Conjunctiva/sclera: Conjunctivae normal.    Cardiovascular:     Rate and Rhythm: Normal rate and regular rhythm.     Pulses: Normal pulses.     Heart sounds: Normal heart sounds, S1 normal and S2 normal. No murmur heard.    No friction rub. No gallop.  Pulmonary:     Effort: Pulmonary effort is normal. No respiratory distress.     Breath sounds: Normal breath sounds.  Chest:   Abdominal:     General: Bowel sounds are normal.     Palpations: Abdomen is soft.     Tenderness: There is no abdominal tenderness. There is no guarding or rebound.     Hernia: No hernia is present.   Musculoskeletal:        General: No swelling.     Cervical back: Full passive range of motion without pain, normal range of motion and neck supple. No spinous process tenderness or muscular tenderness. Normal range of motion.     Right lower leg: No edema.     Left lower leg: No edema.   Skin:    General: Skin is warm and dry.     Capillary Refill: Capillary refill takes less than 2 seconds.     Findings: No ecchymosis, erythema, rash or wound.   Neurological:     General: No focal deficit present.     Mental Status: She is alert and oriented to person, place, and time.     GCS: GCS eye subscore is 4. GCS verbal subscore is 5. GCS motor subscore is 6.     Cranial Nerves: Cranial nerves 2-12 are intact.     Sensory: Sensation is intact.     Motor: Motor function is intact.     Coordination: Coordination is intact.   Psychiatric:        Attention and Perception: Attention normal.        Mood and Affect: Mood normal.        Speech: Speech normal.        Behavior: Behavior normal.     (all labs ordered are listed, but only abnormal results are displayed) Labs Reviewed - No data to display  EKG: None  Radiology: DG Chest 2 View Result Date: 10/25/2023 CLINICAL DATA:  Check central line. Central line for chemotherapy  placed today. Bleeding from the site. EXAM: CHEST - 2 VIEW COMPARISON:  04/18/2020 FINDINGS: Stable cardiomediastinal silhouette. Aortic atherosclerotic calcification. No focal consolidation, pleural effusion, or pneumothorax. No displaced rib fractures. Right shoulder arthroplasty. Right IJ CVC tip in the low SVC. IMPRESSION: 1. Right IJ CVC tip in the low SVC. Electronically Signed   By: Norman Gatlin  M.D.   On: 10/25/2023 23:51     Procedures   Medications Ordered in the ED - No data to display                                  Medical Decision Making Amount and/or Complexity of Data Reviewed Radiology: ordered.   Presents with concern over bleeding from a newly placed tunneled central line.  Catheter is a fairly large caliber catheter and a small amount of bleeding is not surprising.  Chest x-ray unremarkable, line appears to be well-placed.  No active bleeding currently.  Patient reassured, area redressed.  Can place some pressure over the area if there is any further bleeding.     Final diagnoses:  Hemorrhage due to vascular catheter, initial encounter [facility])    ED Discharge Orders     None          Haze Lonni PARAS, MD 10/25/23 2359

## 2023-10-25 NOTE — ED Triage Notes (Signed)
 Patient had central line placed today for chemotherapy infusion. Shortly after placement patient went home and noticed that she had bleeding from the site that was soaking through the dressing. Called nurse lien and was told to come here.

## 2023-10-26 ENCOUNTER — Telehealth (INDEPENDENT_AMBULATORY_CARE_PROVIDER_SITE_OTHER): Admitting: Internal Medicine

## 2023-10-26 ENCOUNTER — Ambulatory Visit: Payer: Self-pay

## 2023-10-26 VITALS — BP 188/86 | Temp 98.0°F | Ht 65.0 in | Wt 183.0 lb

## 2023-10-26 DIAGNOSIS — Z789 Other specified health status: Secondary | ICD-10-CM | POA: Diagnosis not present

## 2023-10-26 DIAGNOSIS — I10 Essential (primary) hypertension: Secondary | ICD-10-CM | POA: Diagnosis not present

## 2023-10-26 DIAGNOSIS — Z20822 Contact with and (suspected) exposure to covid-19: Secondary | ICD-10-CM | POA: Diagnosis not present

## 2023-10-26 DIAGNOSIS — T82838A Hemorrhage of vascular prosthetic devices, implants and grafts, initial encounter: Secondary | ICD-10-CM | POA: Diagnosis not present

## 2023-10-26 DIAGNOSIS — Z9484 Stem cells transplant status: Secondary | ICD-10-CM | POA: Diagnosis not present

## 2023-10-26 DIAGNOSIS — Z8659 Personal history of other mental and behavioral disorders: Secondary | ICD-10-CM

## 2023-10-26 DIAGNOSIS — T451X5A Adverse effect of antineoplastic and immunosuppressive drugs, initial encounter: Secondary | ICD-10-CM | POA: Diagnosis not present

## 2023-10-26 DIAGNOSIS — E876 Hypokalemia: Secondary | ICD-10-CM | POA: Diagnosis not present

## 2023-10-26 DIAGNOSIS — C9000 Multiple myeloma not having achieved remission: Secondary | ICD-10-CM | POA: Diagnosis not present

## 2023-10-26 DIAGNOSIS — F419 Anxiety disorder, unspecified: Secondary | ICD-10-CM

## 2023-10-26 DIAGNOSIS — R112 Nausea with vomiting, unspecified: Secondary | ICD-10-CM | POA: Diagnosis not present

## 2023-10-26 DIAGNOSIS — Z7682 Awaiting organ transplant status: Secondary | ICD-10-CM | POA: Diagnosis not present

## 2023-10-26 DIAGNOSIS — Z52011 Autologous donor, stem cells: Secondary | ICD-10-CM | POA: Diagnosis not present

## 2023-10-26 DIAGNOSIS — K1231 Oral mucositis (ulcerative) due to antineoplastic therapy: Secondary | ICD-10-CM | POA: Diagnosis not present

## 2023-10-26 MED ORDER — AMLODIPINE BESYLATE 5 MG PO TABS: 5.0000 mg | ORAL_TABLET | Freq: Every day | ORAL | 0 refills | Status: DC

## 2023-10-26 NOTE — Progress Notes (Signed)
 Patient Care Team: [name], MD as PCP - General (Internal Medicine)  I connected with [name] on 10/26/23 at 4:37 PM by video enabled telemedicine visit and verified that I am speaking with the correct person using two identifiers, myself and Curtistine Quiet, CMA. I am in my office and patient is in their home.    I discussed the limitations, risks, security and privacy concerns of performing an evaluation and management service by telemedicine and the availability of in-person appointments. I also discussed with the patient that there may be a patient responsible charge related to this service. The patient expressed understanding and agreed to proceed.   Other persons participating in the visit and their role in the encounter: Medical scribe, Damien Blanks  Patient's location: [facility], [place] Provider's location: Clinic   I provided 10 minutes of time spent during this encounter, including chart review, interviewing patient, medical decision making, and e-scribing medication with > 50% was spent counseling as documented under my assessment & plan.   Chief Complaint  Patient presents with   Hypertension    Blood pressure is high even with medication prescribed.    Subjective:  Patient PI:Joanna Ray,Female DOB:Apr 10, 1951,73 y.o. FMW:[id number]  Vitals:   10/26/23 1449  BP: (!) 188/86  73 y.o. Female presents today for 8 day follow-up for Hypertension. Patient has a past medical history of Arthritis; Multiple Myeloma followed by Dr. 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 Seen initially on 6/19 for LE edema, which improved after starting Lasix  20 mg daily, and seen again on 6/23 for Hypertension after being discontinued from Valsartan -HCTZ w/ blood pressure of 164/98 and 160/100, she was started on Olmesartan  20 mg daily however blood pressures have reportedly remained  elevated despite these interventions with a provided reading of 179/83 to the triage nurse this morning. She is a candidate for a stem cell transplant for her Multiple Myeloma, for which she is currently being prepped for and why she is unable to present in person today - this is scheduled for 7/09 according to her. Says her blood pressures today have been 187/89 & 196/90 this morning, and then 188/86 this afternoon. Notes some moderate LE edema persisting despite Lasix  20 mg.   Unfortunately, video visit was interrupted due to poor connectivity and she herself was being seen by a physician in-person, so visit could not be continued.  Past Medical History:  Diagnosis Date   Anxiety    Arthritis    Cancer (HCC) 2021   multiple myloma Right Arm   Cataract    GERD (gastroesophageal reflux disease)    Hyperlipidemia    Hypertension    Urticaria     Allergies  Allergen Reactions   Corn-Containing Products Swelling    Rash    Shellfish Allergy Swelling    Swelling of lips   Family History  Problem Relation Age of Onset   Stroke Mother    Stroke Father    Diabetes Sister    Breast cancer Sister 72   Ovarian cancer Maternal Grandmother 80   Colon cancer Neg Hx    Colon polyps Neg Hx    Esophageal cancer Neg Hx    Rectal cancer Neg Hx    Stomach cancer Neg Hx    Social History   Social History Narrative   Not on file   Review of Systems  Cardiovascular:  Positive for leg swelling (moderate).  All other systems  reviewed and are negative.    Objective:  Vitals: BP (!) 188/86 Comment: 8:25: 187/89, 11:20: 196/90, 11:23: 179/83,  Temp 98 F (36.7 C) (Temporal)   Ht 5' 5 (1.651 m)   Wt 183 lb (83 kg)   BMI 30.45 kg/m  Physical Exam Vitals and nursing note reviewed.  Constitutional:      General: She is not in acute distress.    Appearance: Normal appearance. She is not toxic-appearing.  HENT:     Head: Normocephalic and atraumatic.  Pulmonary:     Effort: Pulmonary  effort is normal.   Skin:    General: Skin is warm and dry.   Neurological:     Mental Status: She is alert and oriented to person, place, and time. Mental status is at baseline.   Psychiatric:        Mood and Affect: Mood normal.        Behavior: Behavior normal.        Thought Content: Thought content normal.        Judgment: Judgment normal.     Results:  Studies Obtained And Personally Reviewed By Me:  DG Chest 2 View Result Date: 10/25/2023 COMPARISON:  04/18/2020  FINDINGS:  Stable cardiomediastinal silhouette.  Aortic atherosclerotic calcification.  No focal consolidation, pleural effusion, or pneumothorax.  No displaced rib fractures.  Right shoulder arthroplasty.  Right IJ CVC tip in the low SVC.  IMPRESSION: 1. Right IJ CVC tip in the low SVC.   Labs:     Component Value Date/Time   NA 145 10/18/2023 0805   NA 145 (H) 12/28/2018 1436   K 2.8 (L) 10/18/2023 0805   CL 106 10/18/2023 0805   CO2 30 10/18/2023 0805   GLUCOSE 104 (H) 10/18/2023 0805   BUN 16 10/18/2023 0805   BUN 15 12/28/2018 1436   CREATININE 0.77 10/18/2023 0805   CREATININE 1.42 (H) 07/22/2023 0956   CALCIUM  9.0 10/18/2023 0805   PROT 6.1 (L) 10/18/2023 0805   PROT 7.7 12/28/2018 1436   ALBUMIN 4.0 10/18/2023 0805   ALBUMIN 4.6 12/28/2018 1436   AST 13 (L) 10/18/2023 0805   ALT 7 10/18/2023 0805   ALKPHOS 46 10/18/2023 0805   BILITOT 1.1 10/18/2023 0805   GFRNONAA >60 10/18/2023 0805   GFRNONAA 50 (L) 09/03/2020 0909   GFRAA 58 (L) 09/03/2020 0909    Lab Results  Component Value Date   WBC 2.7 (L) 09/27/2023   HGB 9.6 (L) 09/27/2023   HCT 29.7 (L) 09/27/2023   MCV 93.7 09/27/2023   PLT 134 (L) 09/27/2023   Lab Results  Component Value Date   CHOL 187 07/22/2023   HDL 61 07/22/2023   LDLCALC 107 (H) 07/22/2023   TRIG 99 07/22/2023   CHOLHDL 3.1 07/22/2023   Lab Results  Component Value Date   HGBA1C 5.7 (H) 09/03/2020    Lab Results  Component Value Date   TSH 1.77  01/14/2023    Assessment & Plan:   Hypertension w/ Associated Moderate Lower Extremity Edema: seen initially on 6/19 for LE edema, which improved after starting Lasix  20 mg daily, and seen again on 6/23 for Hypertension after being discontinued from Valsartan -HCTZ w/ blood pressure of 164/98 and 160/100, she was started on Olmesartan  20 mg daily however blood pressures have reportedly remained elevated despite these interventions with a provided reading of 179/83 to the triage nurse this morning. Says her blood pressures today have been 187/89 & 196/90 this morning, and then 188/86 this  afternoon. Notes some moderate LE edema persisting despite Lasix  20 mg. Blood pressure not at goal, will add Amlodipine  5 mg daily to regimen  Multiple Myeloma followed by Dr. 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 She is a candidate for a stem cell transplant for her Multiple Myeloma, for which she is currently being prepped for and why she is unable to present in person today - this is scheduled for 7/09 according to her.   Anxiety treated with Xanax  0.5 mg twice daily as needed. Recommended taking once or twice daily while waiting for transplant.   Seen in ED yesterday regarding concern for hemorrhage due to vascular catheter, however was reassured in ED that mild bleeding is normal, the area was redressed, and she was told to apply some pressure over area if there is any further bleeding. Reviewed ED course.   Unfortunately, video visit was interrupted due to poor connectivity and she herself was being seen by a physician in-person, so visit could not be continued. However, called back at a later time to finish visit successfully.   I,Emily Lagle,acting as a Neurosurgeon for Ronal JINNY Hailstone, MD.,have documented all relevant documentation on the behalf of Ronal JINNY Hailstone, MD,as directed by  Ronal JINNY Hailstone, MD while in the presence of Ronal JINNY Hailstone, MD.   I, Ronal JINNY Hailstone, MD, have reviewed all  documentation for this visit. The documentation on 11/01/23 for the exam, diagnosis, procedures, and orders are all accurate and complete.

## 2023-10-26 NOTE — ED Notes (Signed)
 New dressing changed, with proper sterile procedure. This RN could not find a central line kit for the dressing change and asked CN if there are any available and at this time there are none that can be located in the department. This RN changed the dressing with the material they was found in the department. Bio patched placed.

## 2023-10-26 NOTE — Telephone Encounter (Signed)
 FYI Only or Action Required?: FYI only for provider.  Patient was last seen in primary care on 10/18/2023 by Joanna Ronal PARAS, MD. Called Nurse Triage reporting Hypertension. Symptoms began several days ago. Interventions attempted: Prescription medications: Olmesartan  and Lasxi. Symptoms are: unchanged.  Triage Disposition: See Physician Within 24 Hours  Patient/caregiver understands and will follow disposition?: Yes  Pt states BP has remained high since med change on 10/18/23. Pt unable to come in for OV d/t getting prepped for stem cell transplant but was able to do VV today at 300. Advised pt to have recent BP if possible before appt. Pt verbalized understanding.   Copied from CRM [id number]. Topic: Clinical - Red Word Triage >> Oct 26, 2023 11:26 AM [name] wrote: [facility] that prompted transfer to Nurse Triage: bp high 179/83 Reason for Disposition  Systolic BP  >= 180 OR Diastolic >= 110  Answer Assessment - Initial Assessment Questions 1. BLOOD PRESSURE: What is the blood pressure? Did you take at least two measurements 5 minutes apart?     179/83 2. ONSET: When did you take your blood pressure?     Several days  3. HOW: How did you take your blood pressure? (e.g., automatic home BP monitor, visiting nurse)     Home BP 4. HISTORY: Do you have a history of high blood pressure?     yes 5. MEDICINES: Are you taking any medicines for blood pressure? Have you missed any doses recently?     Recent switch to olmesartan  and lasix  6. OTHER SYMPTOMS: Do you have any symptoms? (e.g., blurred vision, chest pain, difficulty breathing, headache, weakness)     no  Protocols used: Blood Pressure - High-A-AH

## 2023-10-27 DIAGNOSIS — E876 Hypokalemia: Secondary | ICD-10-CM | POA: Diagnosis not present

## 2023-10-27 DIAGNOSIS — R112 Nausea with vomiting, unspecified: Secondary | ICD-10-CM | POA: Diagnosis not present

## 2023-10-27 DIAGNOSIS — Z9484 Stem cells transplant status: Secondary | ICD-10-CM | POA: Diagnosis not present

## 2023-10-27 DIAGNOSIS — Z7682 Awaiting organ transplant status: Secondary | ICD-10-CM | POA: Diagnosis not present

## 2023-10-27 DIAGNOSIS — Z20822 Contact with and (suspected) exposure to covid-19: Secondary | ICD-10-CM | POA: Diagnosis not present

## 2023-10-27 DIAGNOSIS — K1231 Oral mucositis (ulcerative) due to antineoplastic therapy: Secondary | ICD-10-CM | POA: Diagnosis not present

## 2023-10-27 DIAGNOSIS — Z52011 Autologous donor, stem cells: Secondary | ICD-10-CM | POA: Diagnosis not present

## 2023-10-27 DIAGNOSIS — T451X5A Adverse effect of antineoplastic and immunosuppressive drugs, initial encounter: Secondary | ICD-10-CM | POA: Diagnosis not present

## 2023-10-27 DIAGNOSIS — C9000 Multiple myeloma not having achieved remission: Secondary | ICD-10-CM | POA: Diagnosis not present

## 2023-10-28 DIAGNOSIS — C9000 Multiple myeloma not having achieved remission: Secondary | ICD-10-CM | POA: Diagnosis not present

## 2023-10-29 ENCOUNTER — Encounter (HOSPITAL_COMMUNITY): Payer: Self-pay | Admitting: Emergency Medicine

## 2023-10-29 ENCOUNTER — Emergency Department (HOSPITAL_COMMUNITY)
Admission: EM | Admit: 2023-10-29 | Discharge: 2023-10-29 | Disposition: A | Discharge status: Home / Self Care | Source: Home / Self Care | Attending: Emergency Medicine | Admitting: Emergency Medicine

## 2023-10-29 ENCOUNTER — Emergency Department (HOSPITAL_BASED_OUTPATIENT_CLINIC_OR_DEPARTMENT_OTHER)
Admission: EM | Admit: 2023-10-29 | Discharge: 2023-10-29 | Disposition: A | Discharge status: Home / Self Care | Attending: Emergency Medicine | Admitting: Emergency Medicine

## 2023-10-29 ENCOUNTER — Other Ambulatory Visit: Payer: Self-pay

## 2023-10-29 DIAGNOSIS — T82838A Hemorrhage of vascular prosthetic devices, implants and grafts, initial encounter: Secondary | ICD-10-CM | POA: Insufficient documentation

## 2023-10-29 DIAGNOSIS — T82838D Hemorrhage of vascular prosthetic devices, implants and grafts, subsequent encounter: Secondary | ICD-10-CM | POA: Insufficient documentation

## 2023-10-29 DIAGNOSIS — Y829 Unspecified medical devices associated with adverse incidents: Secondary | ICD-10-CM | POA: Diagnosis not present

## 2023-10-29 DIAGNOSIS — Z7982 Long term (current) use of aspirin: Secondary | ICD-10-CM | POA: Insufficient documentation

## 2023-10-29 DIAGNOSIS — I10 Essential (primary) hypertension: Secondary | ICD-10-CM | POA: Diagnosis not present

## 2023-10-29 DIAGNOSIS — Z7901 Long term (current) use of anticoagulants: Secondary | ICD-10-CM | POA: Diagnosis not present

## 2023-10-29 DIAGNOSIS — C9000 Multiple myeloma not having achieved remission: Secondary | ICD-10-CM | POA: Insufficient documentation

## 2023-10-29 DIAGNOSIS — Z79899 Other long term (current) drug therapy: Secondary | ICD-10-CM | POA: Diagnosis not present

## 2023-10-29 DIAGNOSIS — T82898D Other specified complication of vascular prosthetic devices, implants and grafts, subsequent encounter: Secondary | ICD-10-CM | POA: Diagnosis not present

## 2023-10-29 DIAGNOSIS — Z8579 Personal history of other malignant neoplasms of lymphoid, hematopoietic and related tissues: Secondary | ICD-10-CM | POA: Insufficient documentation

## 2023-10-29 DIAGNOSIS — Y712 Prosthetic and other implants, materials and accessory cardiovascular devices associated with adverse incidents: Secondary | ICD-10-CM | POA: Diagnosis not present

## 2023-10-29 DIAGNOSIS — Z452 Encounter for adjustment and management of vascular access device: Secondary | ICD-10-CM | POA: Diagnosis not present

## 2023-10-29 NOTE — ED Triage Notes (Signed)
 Pt reports concern for bleeding from right upper chest catheter that was placed on Monday for planned chemotherapy.

## 2023-10-29 NOTE — ED Provider Triage Note (Signed)
 Emergency Medicine Provider Triage Evaluation Note  Joanna Ray , a 73 y.o. female  was evaluated in triage.  Pt complains of chest port bleeding.  Patient states that she has the chest port for a stem cell transplant program, patient has been seen x 3 for poor access leading, most recently earlier at [facility].  Patient denies any other symptoms other than bleeding out of port.  Review of Systems  Positive: Chest port bleeding Negative: Fever, chills, chest pain, shortness of breath, weakness, headache, dizziness  Physical Exam  BP (!) 180/87 (BP Location: Right Arm)   Pulse 88   Temp 99.1 Ray (37.3 C)   Resp 18   SpO2 99%  Gen:   Awake, no distress   Resp:  Normal effort  MSK:   Moves extremities without difficulty  Other:  Tegaderm in place over chest port, chest port is mildly bleeding, patient not in acute distress, no hemorrhage appreciated, just oozing of blood  Medical Decision Making  Medically screening exam initiated at 4:35 PM.  Appropriate orders placed.  Joanna Ray was informed that the remainder of the evaluation will be completed by another provider, this initial triage assessment does not replace that evaluation, and the importance of remaining in the ED until their evaluation is complete.  No labs ordered at this time, patient has multiple recent lab visits most recent make 10/18/2023.   Joanna Batra F, PA-C 10/29/23 1637

## 2023-10-29 NOTE — ED Provider Notes (Signed)
 [place] EMERGENCY DEPARTMENT AT [facility] Provider Note   CSN: [id number] Arrival date & time: 10/29/23  0630     Patient presents with: Catheter Issue    Joanna Ray is a 73 y.o. female.  With a history of multiple myeloma who presents to the ED for bleeding.  Patient had triple-lumen catheter placed in the right upper chest on June 30 prior to initiation of immunotherapy.  Since that time she has had on and off bleeding.  She was seen for the same issue here in the ED on the 30th.  Bleeding recurred earlier this morning and soaked around her Tegaderm dressing.  No bleeding from other sites.  She is currently off Eliquis .   HPI     Prior to Admission medications   Medication Sig Start Date End Date Taking? Authorizing Provider  acyclovir  (ZOVIRAX ) 400 MG tablet Take 1 tablet (400 mg total) by mouth 2 (two) times daily. 05/20/23   Joanna Sherrod, MD  ALPRAZolam  (XANAX ) 0.5 MG tablet Take 1 tablet by mouth twice daily as needed for anxiety 05/27/22   Joanna Ronal PARAS, MD  amLODipine  (NORVASC ) 5 MG tablet Take 1 tablet (5 mg total) by mouth daily. 10/26/23   Joanna Ronal PARAS, MD  apixaban  (ELIQUIS ) 2.5 MG TABS tablet Take 1 tablet (2.5 mg total) by mouth 2 (two) times daily. 06/08/23   Joanna Ray, Joanna L, PA-C  aspirin  81 MG tablet Take 81 mg by mouth daily. Patient not taking: Reported on 10/26/2023    [provider]  cholecalciferol  (VITAMIN D ) 1000 UNITS tablet Take 1,000 Units by mouth daily.    [provider]  cyclobenzaprine  (FLEXERIL ) 10 MG tablet Take 1 tablet by mouth three times daily as needed for muscle spasm 05/20/23   Joanna Ronal PARAS, MD  dexamethasone  (DECADRON ) 4 MG tablet Take 5 tabs (20 mg) weekly the day after daratumumab  for 12 weeks. Take with breakfast. 05/20/23   Joanna Sherrod, MD  EPINEPHrine  (EPIPEN  2-PAK) 0.3 mg/0.3 mL IJ SOAJ injection Inject 0.3 mg into the muscle as needed for anaphylaxis. 07/14/22   Joanna Ronal PARAS, MD   ergocalciferol  (DRISDOL ) 1.25 MG (50000 UT) capsule Take 1 capsule (50,000 Units total) by mouth once a week. 10/15/23   Joanna Ronal PARAS, MD  escitalopram  (LEXAPRO ) 10 MG tablet Take 5 mg by mouth daily.    [provider]  fexofenadine  (ALLEGRA ) 180 MG tablet Take 1 tablet (180 mg total) by mouth daily. 06/29/19   Joanna Danita Macintosh, MD  filgrastim-aafi (NIVESTYM) 300 MCG/0.5ML SOSY injection Inject 900 mcg into the skin daily. 10/22/23 10/29/23  [provider]  furosemide  (LASIX ) 20 MG tablet One tab by mouth daily for leg swelling 10/14/23   Joanna Ronal PARAS, MD  gabapentin  (NEURONTIN ) 300 MG capsule Take 1 capsule (300 mg total) by mouth 3 (three) times daily. 10/18/23   Joanna Ronal PARAS, MD  HYDROcodone -acetaminophen  (NORCO/VICODIN) 5-325 MG tablet Take 1 tablet by mouth every 6 (six) hours as needed for moderate pain (pain score 4-6). 08/24/23   Joanna Ray, Joanna L, PA-C  hydrocortisone valerate cream (WESTCORT) 0.2 % 1 app    [provider]  meclizine  (ANTIVERT ) 25 MG tablet Take 1 tablet (25 mg total) by mouth 3 (three) times daily as needed for dizziness. 01/02/23   Joanna Carlin NOVAK, MD  olmesartan  (BENICAR ) 20 MG tablet Take 1 tablet (20 mg total) by mouth daily. 10/18/23   Joanna Ronal PARAS, MD  ondansetron  (ZOFRAN ) 8 MG tablet Take  1 tablet (8 mg total) by mouth every 8 (eight) hours as needed for nausea or vomiting. 05/20/23   Joanna Sherrod, MD  potassium chloride  SA (KLOR-CON  M) 20 MEQ tablet Take 1 tablet (20 mEq total) by mouth 2 (two) times daily. 10/18/23   Joanna Ronal PARAS, MD  prochlorperazine  (COMPAZINE ) 10 MG tablet TAKE 1 TABLET BY MOUTH EVERY 6 HOURS AS NEEDED FOR NAUSEA FOR VOMITING 08/23/23   Joanna Sherrod, MD  rosuvastatin  (CRESTOR ) 20 MG tablet TAKE 1/2 TABLET ONE TIME DAILY 02/20/23   Joanna Ronal PARAS, MD  vitamin E  100 UNIT capsule Take 100 Units by mouth daily. Patient not taking: Reported on 10/26/2023    [provider]    Allergies:  Corn-containing products and Shellfish allergy    Review of Systems  Updated Vital Signs BP (!) 169/94   Pulse 69   Temp 98.1 F (36.7 C) (Oral)   Resp 18   SpO2 99%   Physical Exam Vitals and nursing note reviewed.  HENT:     Head: Normocephalic and atraumatic.  Eyes:     Pupils: Pupils are equal, round, and reactive to light.  Cardiovascular:     Rate and Rhythm: Normal rate and regular rhythm.  Pulmonary:     Effort: Pulmonary effort is normal.     Breath sounds: Normal breath sounds.  Abdominal:     Palpations: Abdomen is soft.     Tenderness: There is no abdominal tenderness.  Musculoskeletal:     Comments: Triple-lumen catheter in place in upper right anterior chest wall Clotted blood around Tegaderm dressing No active bleeding from insertion site or suture site. No surrounding erythema or tenderness  Skin:    General: Skin is warm and dry.  Neurological:     Mental Status: She is alert.  Psychiatric:        Mood and Affect: Mood normal.     (all labs ordered are listed, but only abnormal results are displayed) Labs Reviewed - No data to display  EKG: None  Radiology: No results found.   Procedures   Medications Ordered in the ED - No data to display  Clinical Course as of 10/29/23 0918  [facility] Oct 29, 2023  0914 No active bleeding or oozing.  Appropriate for discharge at this time [MP]    Clinical Course User Index [MP] Pamella Ozell LABOR, DO                                 Medical Decision Making 73 year old female with history as above presenting for bleeding from right triple-lumen catheter site over anterior chest placed on June 30.  Bleeding looks well-controlled now.  I removed the Tegaderm dressing which was clotted off.  No active bleeding from the suture site or insertion site.  I placed a Biopatch at the insertion site which will help with to prevent infection at the insertion site but also tamponade any bleeding.  Will reevaluate for  bleeding in 15 to 20 minutes here.        Final diagnoses:  Bleeding from peripherally inserted central catheter (PICC), subsequent encounter    ED Discharge Orders     None          Pamella Ozell LABOR, DO 10/29/23 [phone]

## 2023-10-29 NOTE — ED Provider Notes (Signed)
 [place] EMERGENCY DEPARTMENT AT [facility] Provider Note   CSN: [id number] Arrival date & time: 10/29/23  1602     Patient presents with: Vascular Access Problem   Joanna Ray is a 73 y.o. female with PMHx of HTN, HLD, and multiple myeloma who presents to the ED for recurrent bleeding from her PICC site.  Patient was seen earlier this morning for the same concern and was discharged after PICC line site was deemed hemostatic and dressing was changed.  Patient had this PICC line placed on June 30 for planned initiation of immunotherapy for her MM.  Later that evening patient had bleeding from the line and was seen in this ED. patient was previously on Eliquis  that she has discontinued for just over 1 week.  Patient first noted bleeding today while resting at home around mid afternoon.  She tried to apply pressure for 30 minutes but noticed that the wound was still bleeding beneath the Tegaderm so she came to the ED. She otherwise has no acute symptoms or pain.   Prior to Admission medications   Medication Sig Start Date End Date Taking? Authorizing Provider  acyclovir  (ZOVIRAX ) 400 MG tablet Take 1 tablet (400 mg total) by mouth 2 (two) times daily. 05/20/23   Sherrod Sherrod, MD  ALPRAZolam  (XANAX ) 0.5 MG tablet Take 1 tablet by mouth twice daily as needed for anxiety 05/27/22   [name], MD  amLODipine  (NORVASC ) 5 MG tablet Take 1 tablet (5 mg total) by mouth daily. 10/26/23   [name], MD  apixaban  (ELIQUIS ) 2.5 MG TABS tablet Take 1 tablet (2.5 mg total) by mouth 2 (two) times daily. 06/08/23   Heilingoetter, Cassandra L, PA-C  aspirin  81 MG tablet Take 81 mg by mouth daily. Patient not taking: Reported on 10/26/2023    [provider]  cholecalciferol  (VITAMIN D ) 1000 UNITS tablet Take 1,000 Units by mouth daily.    [provider]  cyclobenzaprine  (FLEXERIL ) 10 MG tablet Take 1 tablet by mouth three times daily as needed for muscle spasm 05/20/23    [name], MD  dexamethasone  (DECADRON ) 4 MG tablet Take 5 tabs (20 mg) weekly the day after daratumumab  for 12 weeks. Take with breakfast. 05/20/23   Sherrod Sherrod, MD  EPINEPHrine  (EPIPEN  2-PAK) 0.3 mg/0.3 mL IJ SOAJ injection Inject 0.3 mg into the muscle as needed for anaphylaxis. 07/14/22   [name], MD  ergocalciferol  (DRISDOL ) 1.25 MG (50000 UT) capsule Take 1 capsule (50,000 Units total) by mouth once a week. 10/15/23   [name], MD  escitalopram  (LEXAPRO ) 10 MG tablet Take 5 mg by mouth daily.    [provider]  fexofenadine  (ALLEGRA ) 180 MG tablet Take 1 tablet (180 mg total) by mouth daily. 06/29/19   Jeneal Danita Macintosh, MD  filgrastim-aafi (NIVESTYM) 300 MCG/0.5ML SOSY injection Inject 900 mcg into the skin daily. 10/22/23 10/29/23  [provider]  furosemide  (LASIX ) 20 MG tablet One tab by mouth daily for leg swelling 10/14/23   [name], MD  gabapentin  (NEURONTIN ) 300 MG capsule Take 1 capsule (300 mg total) by mouth 3 (three) times daily. 10/18/23   [name], MD  HYDROcodone -acetaminophen  (NORCO/VICODIN) 5-325 MG tablet Take 1 tablet by mouth every 6 (six) hours as needed for moderate pain (pain score 4-6). 08/24/23   Heilingoetter, Cassandra L, PA-C  hydrocortisone valerate cream (WESTCORT) 0.2 % 1 app    [provider]  meclizine  (ANTIVERT ) 25 MG  tablet Take 1 tablet (25 mg total) by mouth 3 (three) times daily as needed for dizziness. 01/02/23   Roselyn Carlin NOVAK, MD  olmesartan  (BENICAR ) 20 MG tablet Take 1 tablet (20 mg total) by mouth daily. 10/18/23   [name], MD  ondansetron  (ZOFRAN ) 8 MG tablet Take 1 tablet (8 mg total) by mouth every 8 (eight) hours as needed for nausea or vomiting. 05/20/23   Sherrod Sherrod, MD  potassium chloride  SA (KLOR-CON  M) 20 MEQ tablet Take 1 tablet (20 mEq total) by mouth 2 (two) times daily. 10/18/23   [name], MD  prochlorperazine  (COMPAZINE ) 10 MG tablet TAKE 1 TABLET BY MOUTH  EVERY 6 HOURS AS NEEDED FOR NAUSEA FOR VOMITING 08/23/23   Sherrod Sherrod, MD  rosuvastatin  (CRESTOR ) 20 MG tablet TAKE 1/2 TABLET ONE TIME DAILY 02/20/23   [name], MD  vitamin E  100 UNIT capsule Take 100 Units by mouth daily. Patient not taking: Reported on 10/26/2023    [provider]    Allergies: Corn-containing products and Shellfish allergy     Updated Vital Signs BP (!) 180/87 (BP Location: Right Arm)   Pulse 88   Temp 99 F (37.2 C) (Oral)   Resp 18   Ht 5' 5 (1.651 m)   Wt 83 kg   SpO2 99%   BMI 30.45 kg/m   Physical Exam Vitals reviewed.  Constitutional:      General: She is not in acute distress.    Appearance: She is not toxic-appearing or diaphoretic.  HENT:     Head: Normocephalic and atraumatic.     Nose: Nose normal. No rhinorrhea.     Mouth/Throat:     Mouth: Mucous membranes are moist.     Pharynx: Oropharynx is clear.  Eyes:     Extraocular Movements: Extraocular movements intact.     Pupils: Pupils are equal, round, and reactive to light.  Cardiovascular:     Rate and Rhythm: Normal rate and regular rhythm.     Heart sounds: No murmur heard.    No gallop.  Pulmonary:     Effort: Pulmonary effort is normal. No respiratory distress.  Chest:     Chest wall: No tenderness.  Abdominal:     General: Abdomen is flat.     Palpations: Abdomen is soft.     Tenderness: There is no abdominal tenderness. There is no guarding.  Musculoskeletal:        General: Normal range of motion.     Cervical back: Neck supple. No tenderness.  Skin:    General: Skin is warm and dry.     Capillary Refill: Capillary refill takes less than 2 seconds.     Comments: Triple lumen CVC in place on R superior chest wall with bleeding contained within tegaderm dressing. Once dressing removed, wound is hemostatic with clotting of the insertion site  Neurological:     General: No focal deficit present.     Mental Status: She is alert and oriented to person,  place, and time.     (all labs ordered are listed, but only abnormal results are displayed) Labs Reviewed - No data to display  EKG: None  Radiology: No results found.   Medical Decision Making Patient with the above history presenting for recurrent bleeding from triple-lumen CVC line site s/p insertion 5 days ago.  She was seen for acute bleeding from the site at the evening of the date of insertion, and was also seen earlier this  morning in this ED for the same, discharged in stable condition both times. She was previously on Eliquis  but this has been discontinued for over 1 week.  On evaluation at bedside, the wound appears to have clotted off and is currently hemostatic with no blood escaping from tegaderm dressing.  Patient is otherwise HDS and asymptomatic with no signs/symptoms of significant blood loss/anemia.  Wound was thoroughly cleaned and new Tegaderm + Biopatch were replaced at bedside.  Patient was then observed for roughly 1.5 hours to ensure no repeated bleeding occurred.  Wound remained clean and hemostatic while in the ED.  Patient was discharged in stable condition with plan for previously scheduled follow-up next week to initiate chemotherapy.  She was given strict return precautions and instructions to apply pressure to the wound should it begin to bleed again.    Final diagnoses:  Bleeding from peripherally inserted central catheter (PICC), initial encounter [facility])    ED Discharge Orders     None          Raoul Rake, MD 10/29/23 2351    Franklyn Sid SAILOR, MD 10/31/23 2222

## 2023-10-29 NOTE — Discharge Instructions (Signed)
 You were seen in the emergency room for bleeding from your right chest catheter site. We were able to change her dressing and stopped bleeding here. If the bleeding happens again you should apply direct pressure using 2 fingers at the insertion site as demonstrated for at least 15 minutes If the bleeding continues after that come to the emergency department Follow-up with your oncology team next week as scheduled

## 2023-10-29 NOTE — Discharge Instructions (Addendum)
 You were seen today for bleeding from your PICC line. While you were here we monitored your vitals, preformed a physical exam, and exchanged your line dressings. These were all reassuring and there is no indication for any further testing or intervention in the emergency department at this time.   Things to do:  - Follow up with your primary care provider within the next few days, or later as needed - Follow up with your oncologist as previously scheduled  Return to the emergency department if you have any new or worsening symptoms including recurrent uncontrolled bleeding, new dizziness upon standing, confusion, severe weakness, or if you have any other serious medical concerns.

## 2023-10-29 NOTE — ED Triage Notes (Signed)
 Pt via POV to ED for 2nd time today due to bleeding tri-cath in right upper chest. Pt has not taken eliquis  since June 23rd, due to resume after stem cell treatment scheduled for next week. No pain. Bleeding noted around catheter insertion site and leaking around dressing; pt is currently applying pressure and no active bleeding noted.

## 2023-10-30 ENCOUNTER — Telehealth: Payer: Self-pay | Admitting: Internal Medicine

## 2023-10-30 ENCOUNTER — Encounter: Payer: Self-pay | Admitting: Internal Medicine

## 2023-10-30 ENCOUNTER — Other Ambulatory Visit: Payer: Self-pay | Admitting: Internal Medicine

## 2023-10-30 DIAGNOSIS — H8110 Benign paroxysmal vertigo, unspecified ear: Secondary | ICD-10-CM

## 2023-10-30 DIAGNOSIS — F4321 Adjustment disorder with depressed mood: Secondary | ICD-10-CM

## 2023-10-30 NOTE — Telephone Encounter (Signed)
 Contacted by answering service. Patient has questions about BP meds. Patient has been prescribed Lasix , Benicar  and amlodipine . Nilsa was in ED for bleeding from PICC line.BP in ED was 180/87. Patient advised to follow up with me. She is at her home and I am out of the office but on call and available by phone. We connected by phone and she is agreeable to speaking with me in this format today. Says BP was elevated in the ED yesterday. Has questions about her BP regimen. Asking which meds she should be taking. I would like for her to take Amlodipine , Benicar  and Lasix  20 mg daily.Note from [facility] says BP was 178/75 on July 2nd one take after amlodipine  was added. Advise her today she is to take all 3 meds daily. We can follow up this coming week in the office  Potassium was 2.8 according to records at [facility] on July 2. Was prescribed potassium supplement 20 meq on June 23. Needs to take this as well and will plan follow up when I am back in the office on Wednesday.

## 2023-11-01 ENCOUNTER — Telehealth: Payer: Self-pay | Admitting: Internal Medicine

## 2023-11-01 ENCOUNTER — Encounter: Payer: Self-pay | Admitting: Internal Medicine

## 2023-11-01 NOTE — Patient Instructions (Signed)
 Adding amlodipine  to current blood pressure medications. Continue Xanax . Hopefully blood pressure will improve in 2-3 days.

## 2023-11-01 NOTE — Telephone Encounter (Signed)
 Made contact by phone regarding BP management. Patient reports BP around 150/90. I have asked patient to follow up in office this coming Wednesday. Reports she is taking 3 drug regimen and potassium supplement. Will check B-met at visit Wednesday July 9.  MJB, MD

## 2023-11-02 DIAGNOSIS — Z20822 Contact with and (suspected) exposure to covid-19: Secondary | ICD-10-CM | POA: Diagnosis not present

## 2023-11-02 DIAGNOSIS — E876 Hypokalemia: Secondary | ICD-10-CM | POA: Diagnosis not present

## 2023-11-02 DIAGNOSIS — Z52011 Autologous donor, stem cells: Secondary | ICD-10-CM | POA: Diagnosis not present

## 2023-11-02 DIAGNOSIS — C9000 Multiple myeloma not having achieved remission: Secondary | ICD-10-CM | POA: Diagnosis not present

## 2023-11-02 DIAGNOSIS — K1231 Oral mucositis (ulcerative) due to antineoplastic therapy: Secondary | ICD-10-CM | POA: Diagnosis not present

## 2023-11-02 DIAGNOSIS — Z9484 Stem cells transplant status: Secondary | ICD-10-CM | POA: Diagnosis not present

## 2023-11-02 DIAGNOSIS — Z7682 Awaiting organ transplant status: Secondary | ICD-10-CM | POA: Diagnosis not present

## 2023-11-02 DIAGNOSIS — R112 Nausea with vomiting, unspecified: Secondary | ICD-10-CM | POA: Diagnosis not present

## 2023-11-02 DIAGNOSIS — T451X5A Adverse effect of antineoplastic and immunosuppressive drugs, initial encounter: Secondary | ICD-10-CM | POA: Diagnosis not present

## 2023-11-03 ENCOUNTER — Telehealth: Payer: Self-pay

## 2023-11-03 DIAGNOSIS — T451X5A Adverse effect of antineoplastic and immunosuppressive drugs, initial encounter: Secondary | ICD-10-CM | POA: Diagnosis not present

## 2023-11-03 DIAGNOSIS — C9000 Multiple myeloma not having achieved remission: Secondary | ICD-10-CM | POA: Diagnosis not present

## 2023-11-03 DIAGNOSIS — R112 Nausea with vomiting, unspecified: Secondary | ICD-10-CM | POA: Diagnosis not present

## 2023-11-03 DIAGNOSIS — Z9484 Stem cells transplant status: Secondary | ICD-10-CM | POA: Diagnosis not present

## 2023-11-03 DIAGNOSIS — K1231 Oral mucositis (ulcerative) due to antineoplastic therapy: Secondary | ICD-10-CM | POA: Diagnosis not present

## 2023-11-03 DIAGNOSIS — E876 Hypokalemia: Secondary | ICD-10-CM | POA: Diagnosis not present

## 2023-11-03 DIAGNOSIS — Z7682 Awaiting organ transplant status: Secondary | ICD-10-CM | POA: Diagnosis not present

## 2023-11-03 DIAGNOSIS — Z20822 Contact with and (suspected) exposure to covid-19: Secondary | ICD-10-CM | POA: Diagnosis not present

## 2023-11-03 DIAGNOSIS — Z52011 Autologous donor, stem cells: Secondary | ICD-10-CM | POA: Diagnosis not present

## 2023-11-03 NOTE — Telephone Encounter (Signed)
 Called to book a follow up appt with Dr Lossie both mobile and home. Unable to leave a message. Will call back later this morning.

## 2023-11-03 NOTE — Telephone Encounter (Signed)
 Copied from CRM [id number]. Topic: Appointments - Appointment Scheduling >> Nov 01, 2023 10:23 AM Emylou G wrote: Patient called.. said Wednesday she can't make her bp appt f/u ( it read this morning 148/84 - it has come down ) .  Wants to know if she can come tomorrow?  Wed and Tamara she will be at the oncologist appt.

## 2023-11-04 DIAGNOSIS — Z20822 Contact with and (suspected) exposure to covid-19: Secondary | ICD-10-CM | POA: Diagnosis not present

## 2023-11-04 DIAGNOSIS — K1231 Oral mucositis (ulcerative) due to antineoplastic therapy: Secondary | ICD-10-CM | POA: Diagnosis not present

## 2023-11-04 DIAGNOSIS — Z0279 Encounter for issue of other medical certificate: Secondary | ICD-10-CM

## 2023-11-04 DIAGNOSIS — T451X5A Adverse effect of antineoplastic and immunosuppressive drugs, initial encounter: Secondary | ICD-10-CM | POA: Diagnosis not present

## 2023-11-04 DIAGNOSIS — Z52011 Autologous donor, stem cells: Secondary | ICD-10-CM | POA: Diagnosis not present

## 2023-11-04 DIAGNOSIS — Z9484 Stem cells transplant status: Secondary | ICD-10-CM | POA: Diagnosis not present

## 2023-11-04 DIAGNOSIS — E876 Hypokalemia: Secondary | ICD-10-CM | POA: Diagnosis not present

## 2023-11-04 DIAGNOSIS — Z7682 Awaiting organ transplant status: Secondary | ICD-10-CM | POA: Diagnosis not present

## 2023-11-04 DIAGNOSIS — C9000 Multiple myeloma not having achieved remission: Secondary | ICD-10-CM | POA: Diagnosis not present

## 2023-11-04 DIAGNOSIS — R112 Nausea with vomiting, unspecified: Secondary | ICD-10-CM | POA: Diagnosis not present

## 2023-11-05 DIAGNOSIS — C9000 Multiple myeloma not having achieved remission: Secondary | ICD-10-CM | POA: Diagnosis not present

## 2023-11-05 DIAGNOSIS — Z20822 Contact with and (suspected) exposure to covid-19: Secondary | ICD-10-CM | POA: Diagnosis not present

## 2023-11-05 DIAGNOSIS — Z52011 Autologous donor, stem cells: Secondary | ICD-10-CM | POA: Diagnosis not present

## 2023-11-05 DIAGNOSIS — K1231 Oral mucositis (ulcerative) due to antineoplastic therapy: Secondary | ICD-10-CM | POA: Diagnosis not present

## 2023-11-05 DIAGNOSIS — Z9484 Stem cells transplant status: Secondary | ICD-10-CM | POA: Diagnosis not present

## 2023-11-05 DIAGNOSIS — T451X5A Adverse effect of antineoplastic and immunosuppressive drugs, initial encounter: Secondary | ICD-10-CM | POA: Diagnosis not present

## 2023-11-05 DIAGNOSIS — R112 Nausea with vomiting, unspecified: Secondary | ICD-10-CM | POA: Diagnosis not present

## 2023-11-05 DIAGNOSIS — Z7682 Awaiting organ transplant status: Secondary | ICD-10-CM | POA: Diagnosis not present

## 2023-11-05 DIAGNOSIS — E876 Hypokalemia: Secondary | ICD-10-CM | POA: Diagnosis not present

## 2023-11-06 DIAGNOSIS — T451X5A Adverse effect of antineoplastic and immunosuppressive drugs, initial encounter: Secondary | ICD-10-CM | POA: Diagnosis not present

## 2023-11-06 DIAGNOSIS — Z52011 Autologous donor, stem cells: Secondary | ICD-10-CM | POA: Diagnosis not present

## 2023-11-06 DIAGNOSIS — Z20822 Contact with and (suspected) exposure to covid-19: Secondary | ICD-10-CM | POA: Diagnosis not present

## 2023-11-06 DIAGNOSIS — E876 Hypokalemia: Secondary | ICD-10-CM | POA: Diagnosis not present

## 2023-11-06 DIAGNOSIS — Z7682 Awaiting organ transplant status: Secondary | ICD-10-CM | POA: Diagnosis not present

## 2023-11-06 DIAGNOSIS — C9000 Multiple myeloma not having achieved remission: Secondary | ICD-10-CM | POA: Diagnosis not present

## 2023-11-06 DIAGNOSIS — K1231 Oral mucositis (ulcerative) due to antineoplastic therapy: Secondary | ICD-10-CM | POA: Diagnosis not present

## 2023-11-06 DIAGNOSIS — R112 Nausea with vomiting, unspecified: Secondary | ICD-10-CM | POA: Diagnosis not present

## 2023-11-06 DIAGNOSIS — Z9484 Stem cells transplant status: Secondary | ICD-10-CM | POA: Diagnosis not present

## 2023-11-07 DIAGNOSIS — R112 Nausea with vomiting, unspecified: Secondary | ICD-10-CM | POA: Diagnosis not present

## 2023-11-07 DIAGNOSIS — C9000 Multiple myeloma not having achieved remission: Secondary | ICD-10-CM | POA: Diagnosis not present

## 2023-11-07 DIAGNOSIS — Z52011 Autologous donor, stem cells: Secondary | ICD-10-CM | POA: Diagnosis not present

## 2023-11-07 DIAGNOSIS — E876 Hypokalemia: Secondary | ICD-10-CM | POA: Diagnosis not present

## 2023-11-07 DIAGNOSIS — T451X5A Adverse effect of antineoplastic and immunosuppressive drugs, initial encounter: Secondary | ICD-10-CM | POA: Diagnosis not present

## 2023-11-07 DIAGNOSIS — Z20822 Contact with and (suspected) exposure to covid-19: Secondary | ICD-10-CM | POA: Diagnosis not present

## 2023-11-07 DIAGNOSIS — Z7682 Awaiting organ transplant status: Secondary | ICD-10-CM | POA: Diagnosis not present

## 2023-11-07 DIAGNOSIS — Z9484 Stem cells transplant status: Secondary | ICD-10-CM | POA: Diagnosis not present

## 2023-11-07 DIAGNOSIS — K1231 Oral mucositis (ulcerative) due to antineoplastic therapy: Secondary | ICD-10-CM | POA: Diagnosis not present

## 2023-11-08 DIAGNOSIS — T451X5A Adverse effect of antineoplastic and immunosuppressive drugs, initial encounter: Secondary | ICD-10-CM | POA: Diagnosis not present

## 2023-11-08 DIAGNOSIS — Z9484 Stem cells transplant status: Secondary | ICD-10-CM | POA: Diagnosis not present

## 2023-11-08 DIAGNOSIS — R112 Nausea with vomiting, unspecified: Secondary | ICD-10-CM | POA: Diagnosis not present

## 2023-11-08 DIAGNOSIS — K1231 Oral mucositis (ulcerative) due to antineoplastic therapy: Secondary | ICD-10-CM | POA: Diagnosis not present

## 2023-11-08 DIAGNOSIS — Z7682 Awaiting organ transplant status: Secondary | ICD-10-CM | POA: Diagnosis not present

## 2023-11-08 DIAGNOSIS — E876 Hypokalemia: Secondary | ICD-10-CM | POA: Diagnosis not present

## 2023-11-08 DIAGNOSIS — Z52011 Autologous donor, stem cells: Secondary | ICD-10-CM | POA: Diagnosis not present

## 2023-11-08 DIAGNOSIS — Z20822 Contact with and (suspected) exposure to covid-19: Secondary | ICD-10-CM | POA: Diagnosis not present

## 2023-11-08 DIAGNOSIS — C9000 Multiple myeloma not having achieved remission: Secondary | ICD-10-CM | POA: Diagnosis not present

## 2023-11-09 ENCOUNTER — Ambulatory Visit: Admitting: Internal Medicine

## 2023-11-09 DIAGNOSIS — Z52011 Autologous donor, stem cells: Secondary | ICD-10-CM | POA: Diagnosis not present

## 2023-11-09 DIAGNOSIS — Z9484 Stem cells transplant status: Secondary | ICD-10-CM | POA: Diagnosis not present

## 2023-11-09 DIAGNOSIS — E876 Hypokalemia: Secondary | ICD-10-CM | POA: Diagnosis not present

## 2023-11-09 DIAGNOSIS — C9000 Multiple myeloma not having achieved remission: Secondary | ICD-10-CM | POA: Diagnosis not present

## 2023-11-09 DIAGNOSIS — K1231 Oral mucositis (ulcerative) due to antineoplastic therapy: Secondary | ICD-10-CM | POA: Diagnosis not present

## 2023-11-09 DIAGNOSIS — Z20822 Contact with and (suspected) exposure to covid-19: Secondary | ICD-10-CM | POA: Diagnosis not present

## 2023-11-09 DIAGNOSIS — T451X5A Adverse effect of antineoplastic and immunosuppressive drugs, initial encounter: Secondary | ICD-10-CM | POA: Diagnosis not present

## 2023-11-09 DIAGNOSIS — R112 Nausea with vomiting, unspecified: Secondary | ICD-10-CM | POA: Diagnosis not present

## 2023-11-09 DIAGNOSIS — Z7682 Awaiting organ transplant status: Secondary | ICD-10-CM | POA: Diagnosis not present

## 2023-11-10 DIAGNOSIS — Z7682 Awaiting organ transplant status: Secondary | ICD-10-CM | POA: Diagnosis not present

## 2023-11-10 DIAGNOSIS — Z9484 Stem cells transplant status: Secondary | ICD-10-CM | POA: Diagnosis not present

## 2023-11-10 DIAGNOSIS — R112 Nausea with vomiting, unspecified: Secondary | ICD-10-CM | POA: Diagnosis not present

## 2023-11-10 DIAGNOSIS — Z20822 Contact with and (suspected) exposure to covid-19: Secondary | ICD-10-CM | POA: Diagnosis not present

## 2023-11-10 DIAGNOSIS — Z52011 Autologous donor, stem cells: Secondary | ICD-10-CM | POA: Diagnosis not present

## 2023-11-10 DIAGNOSIS — K1231 Oral mucositis (ulcerative) due to antineoplastic therapy: Secondary | ICD-10-CM | POA: Diagnosis not present

## 2023-11-10 DIAGNOSIS — T451X5A Adverse effect of antineoplastic and immunosuppressive drugs, initial encounter: Secondary | ICD-10-CM | POA: Diagnosis not present

## 2023-11-10 DIAGNOSIS — E876 Hypokalemia: Secondary | ICD-10-CM | POA: Diagnosis not present

## 2023-11-10 DIAGNOSIS — C9000 Multiple myeloma not having achieved remission: Secondary | ICD-10-CM | POA: Diagnosis not present

## 2023-11-11 DIAGNOSIS — T451X5A Adverse effect of antineoplastic and immunosuppressive drugs, initial encounter: Secondary | ICD-10-CM | POA: Diagnosis not present

## 2023-11-11 DIAGNOSIS — Z7682 Awaiting organ transplant status: Secondary | ICD-10-CM | POA: Diagnosis not present

## 2023-11-11 DIAGNOSIS — C9000 Multiple myeloma not having achieved remission: Secondary | ICD-10-CM | POA: Diagnosis not present

## 2023-11-11 DIAGNOSIS — K1231 Oral mucositis (ulcerative) due to antineoplastic therapy: Secondary | ICD-10-CM | POA: Diagnosis not present

## 2023-11-11 DIAGNOSIS — R112 Nausea with vomiting, unspecified: Secondary | ICD-10-CM | POA: Diagnosis not present

## 2023-11-11 DIAGNOSIS — E876 Hypokalemia: Secondary | ICD-10-CM | POA: Diagnosis not present

## 2023-11-11 DIAGNOSIS — Z9484 Stem cells transplant status: Secondary | ICD-10-CM | POA: Diagnosis not present

## 2023-11-11 DIAGNOSIS — Z52011 Autologous donor, stem cells: Secondary | ICD-10-CM | POA: Diagnosis not present

## 2023-11-11 DIAGNOSIS — Z20822 Contact with and (suspected) exposure to covid-19: Secondary | ICD-10-CM | POA: Diagnosis not present

## 2023-11-12 ENCOUNTER — Telehealth: Payer: Self-pay

## 2023-11-12 ENCOUNTER — Other Ambulatory Visit: Payer: Self-pay | Admitting: *Deleted

## 2023-11-12 ENCOUNTER — Ambulatory Visit: Admitting: Internal Medicine

## 2023-11-12 DIAGNOSIS — Z9484 Stem cells transplant status: Secondary | ICD-10-CM | POA: Diagnosis not present

## 2023-11-12 DIAGNOSIS — I10 Essential (primary) hypertension: Secondary | ICD-10-CM | POA: Diagnosis not present

## 2023-11-12 DIAGNOSIS — T451X5A Adverse effect of antineoplastic and immunosuppressive drugs, initial encounter: Secondary | ICD-10-CM | POA: Diagnosis not present

## 2023-11-12 DIAGNOSIS — J122 Parainfluenza virus pneumonia: Secondary | ICD-10-CM | POA: Diagnosis not present

## 2023-11-12 DIAGNOSIS — R918 Other nonspecific abnormal finding of lung field: Secondary | ICD-10-CM | POA: Diagnosis not present

## 2023-11-12 DIAGNOSIS — C9000 Multiple myeloma not having achieved remission: Secondary | ICD-10-CM

## 2023-11-12 DIAGNOSIS — R1312 Dysphagia, oropharyngeal phase: Secondary | ICD-10-CM | POA: Diagnosis not present

## 2023-11-12 DIAGNOSIS — R6339 Other feeding difficulties: Secondary | ICD-10-CM | POA: Diagnosis not present

## 2023-11-12 DIAGNOSIS — Z452 Encounter for adjustment and management of vascular access device: Secondary | ICD-10-CM | POA: Diagnosis not present

## 2023-11-12 DIAGNOSIS — D709 Neutropenia, unspecified: Secondary | ICD-10-CM | POA: Diagnosis not present

## 2023-11-12 DIAGNOSIS — D61810 Antineoplastic chemotherapy induced pancytopenia: Secondary | ICD-10-CM | POA: Diagnosis not present

## 2023-11-12 DIAGNOSIS — E870 Hyperosmolality and hypernatremia: Secondary | ICD-10-CM | POA: Diagnosis not present

## 2023-11-12 DIAGNOSIS — R197 Diarrhea, unspecified: Secondary | ICD-10-CM | POA: Diagnosis not present

## 2023-11-12 DIAGNOSIS — C9001 Multiple myeloma in remission: Secondary | ICD-10-CM | POA: Diagnosis not present

## 2023-11-12 DIAGNOSIS — K1231 Oral mucositis (ulcerative) due to antineoplastic therapy: Secondary | ICD-10-CM | POA: Diagnosis not present

## 2023-11-12 DIAGNOSIS — D61818 Other pancytopenia: Secondary | ICD-10-CM | POA: Diagnosis not present

## 2023-11-12 DIAGNOSIS — R5081 Fever presenting with conditions classified elsewhere: Secondary | ICD-10-CM | POA: Diagnosis not present

## 2023-11-12 DIAGNOSIS — R633 Feeding difficulties, unspecified: Secondary | ICD-10-CM | POA: Diagnosis not present

## 2023-11-12 NOTE — Telephone Encounter (Signed)
 Copied from CRM [id number]. Topic: Appointments - Appointment Cancel/Reschedule >> Nov 12, 2023 10:42 AM Sophia H wrote: Patient/patient representative is calling to cancel or reschedule an appointment. Refer to attachments for appointment information.    Cancelling appointment for this afternoon, patient was admitted to hospital. Nov 12 2023 03:30 PM - Office Visit Ronal Norleen Hailstone, MD - Ronal JINNY Hailstone, MD

## 2023-11-12 NOTE — H&P (Signed)
 ------------------------------------------------------------------------------- Attestation signed by Donnice Fairy Blalock, MD at 11/14/2023  8:05 AM I have reviewed the history, examined the patient, and reviewed labs.  I have discussed the assessment and plan with the APP and with the team.  I agree with the APP's findings and plan of care.  73 y.o. female with known history of IgG kappa multiple myeloma in a VGPR after most recent treatment with D-RVD x 6 cycles now day +8 Mel AutoHCT admitted for neutropenic fever. Mild cough. RVP +parainfluenza 2.  Follow up blood cx.  CXR.   I spent 75  minutes on the day of this visit managing this patient's care. Management time included personal review of records, history taking, physical exam, patient education and counseling, and documentation.  -------------------------------------------------------------------------------  SCTCT Admission History & Physical  Chief Complaint:  Neutropenic Fever During Auto SCT  Review of Systems Constitutional: positive for fatigue Eyes: negative Ears, nose, mouth, throat, and face: positive for nasal congestion and sore throat Respiratory: positive for cough and sputum Cardiovascular: negative Gastrointestinal: positive for diarrhea Genitourinary:negative Integument/breast: negative Hematologic/lymphatic: negative Musculoskeletal:negative Neurological: negative Behavioral/Psych: negative Endocrine: negative Allergic/Immunologic: negative  History of Present Illness:  Joanna Ray is a 73 y.o. female with known history of IgG kappa multiple myeloma in a VGPR after most recent treatment with D-RVD x 6 cycles.  Undergoing outpatient autologous peripheral blood stem cell transplant since 11/03/2023 with preparative melphalan 200 mg/m2 followed by stem cell infusion on 11/04/2023. Presented to OP clinic today with neutropenic fever with ongoing grade 1 mucositis/esophagitis, ongoing frothy sputum  without observed cough, progressive diarrhea with 5 stools overnight without abdominal pain.  Admitting for evaluation and management of neutropenic fever.   Oncology History  Multiple myeloma not having achieved remission    (CMD)  09/09/2023 Initial Diagnosis   Multiple myeloma not having achieved remission    (CMD)   10/25/2023 - 10/26/2023 Supportive Treatment   OP ONC Supportive GCSF (FILGRASTIM / PLERIXAFOR) for STEM cell mobilization Plan Provider: Norleen Debby Funk, MD Treatment goal: Supportive Line of treatment: [No plan line of treatment]   10/26/2023 - 10/27/2023 Supportive Treatment   BMT Apheresis Mobilized Collection Orders Plan Provider: Norleen Debby Funk, MD   10/27/2023 -  Supportive Treatment   Intravenous Fluids Twice Weekly Plan Provider: Norleen Debby Funk, MD   11/01/2023 -  Supportive Treatment   Adult OP Blood Administration PRN x 30 DAYS Plan Provider: Norleen Debby Funk, MD   11/02/2023 -  Oncology Treatment   Protocol TCT - [facility] OP Autologous Melphalan - Commercial  Chemotherapy/Immunotherapy Medications melphalan (ALKERAN) 390 mg in sodium chloride  0.9 % 195 mL (2 mg/mL) chemo IVPB, 200 mg/m2 = 390 mg, intravenous Administration: 390 mg (11/03/2023)  [Plan is still active]   11/02/2023 -  Supportive Treatment   Adult Peripheral IV and Central Venous Access Orders Plan Provider: Norleen Debby Funk, MD   11/04/2023 Transplant   AUTOLOGOUS STEM CELL TRANSPLANT: Disease status at time of transplant: VGPR ASBMT risk stratification: ___ risk, ECOG/KPS: 1 / 80%, HSCT-CI score: 3 Started mobilization on 10/22/23 using GCSF and Mozobil. She collected peripheral blood stem cells on 10/26/23 and 10/27/23.  Preparative regimen with Melphalan 200 mg/m2 on 11/03/23 (outpatient). Infusion of stem cells - _____ x 10(6) on 11/04/23 (outpatient)  Engraftment: WBC engraftment on _____ and platelet engraftment on _____. Last platelet transfusion given _____.       Medical  History[1]  Surgical History[2]  Family History[3]  Social History   Socioeconomic History  .  Marital status: Married    Spouse name: Not on file  . Number of children: Not on file  . Years of education: Not on file  . Highest education level: Not on file  Occupational History  . Not on file  Tobacco Use  . Smoking status: Former  . Smokeless tobacco: Never  Substance and Sexual Activity  . Alcohol use: No  . Drug use: Never  . Sexual activity: Not on file  Other Topics Concern  . Not on file  Social History Narrative  . Not on file   Social Drivers of Health   Food Insecurity: No Food Insecurity (01/19/2023)   Received from [facility]   Food vital sign   . Within the past 12 months, you worried that your food would run out before you got money to buy more: Never true   . Within the past 12 months, the food you bought just didn't last and you didn't have money to get more: Never true  Transportation Needs: No Transportation Needs (01/19/2023)   Received from [facility] - Transportation   . Lack of Transportation (Medical): No   . Lack of Transportation (Non-Medical): No  Safety: Not At Risk (07/02/2020)   Received from [facility]   Safety   . Within the last year, have you been afraid of your partner or ex-partner?: No   . Within the last year, have you been humiliated or emotionally abused in other ways by your partner or ex-partner?: No   . Within the last year, have you been kicked, hit, slapped, or otherwise physically hurt by your partner or ex-partner?: No   . Within the last year, have you been raped or forced to have any kind of sexual activity by your partner or ex-partner?: No  Living Situation: Not on file    Social Drivers of Health   Living Situation: Not on file  Food Insecurity: No Food Insecurity (01/19/2023)   Received from [facility]   Food vital sign   . Within the past 12 months, you worried that your food would run out before you  got money to buy more: Never true   . Within the past 12 months, the food you bought just didn't last and you didn't have money to get more: Never true  Transportation Needs: No Transportation Needs (01/19/2023)   Received from [facility] - Transportation   . Lack of Transportation (Medical): No   . Lack of Transportation (Non-Medical): No  Utilities: Not At Risk (01/19/2023)   Received from [facility] Utilities   . Threatened with loss of utilities: No  Alcohol Screening: Not on file  Tobacco Use: Medium Risk (11/12/2023)   Patient History   . Smoking Tobacco Use: Former   . Smokeless Tobacco Use: Never   . Passive Exposure: Not on file  Depression: Not at risk (10/19/2023)   PHQ-2   . PHQ-2 Score: 0    Allergies[4]  Prior to Admission medications  Medication  acyclovir  (ZOVIRAX ) 400 mg tablet  ALPRAZolam  (XANAX ) 0.5 mg tablet  amLODIPine  (NORVASC ) 5 mg tablet  EPINEPHrine  (EPIPEN ) 0.3 mg/0.3 mL injection syringe  ergocalciferol  (VITAMIN D2) 1,250 mcg (50,000 unit) capsule  fexofenadine  (ALLEGRA ) 180 mg tablet  fluconazole (DIFLUCAN) 200 mg tablet  gabapentin  (NEURONTIN ) 300 mg capsule  HYDROcodone -acetaminophen  (NORCO) 5-325 mg per tablet  hydrocortisone valerate (WESTCORT) 0.2 % cream  levoFLOXacin (LEVAQUIN) 500 mg tablet  loratadine (CLARITIN) 10 mg tablet  olmesartan  (BENICAR ) 20 mg tablet  ondansetron  (ZOFRAN ) 8 mg tablet  oxyCODONE  (ROXICODONE ) 5 mg immediate release tablet  polyethylene glycol (MIRALAX) 17 gram powd powder  potassium chloride  (MICRO-K ) 10 mEq CR capsule  prochlorperazine  (COMPAZINE ) 5 mg tablet  rosuvastatin  (CRESTOR ) 20 mg tablet  senna (SENOKOT) 8.6 mg tablet  [facility] magic mouthwash    Objective:  Physical Exam There were no vitals taken for this visit. General: Well developed, well nourished female in no acute distress.  Accompanied by sister.   Head-Eyes-Ears-Nose-Throat: No scleral icterus or injection. PER. Nares and  oropharynx are clear without erythema or exudate.  Can hear nasal congestion in voice today.  Mucous membranes are pink and moist.  Thick, ropy secretions.  Yellow coating to tongue.  Cardiovascular: Regular rate and regular rhythm without murmurs, rubs or gallops.  Chest/Lungs: Clear to auscultation throughout without wheezes, rhonchi or crackles.  No cough observed.  Abdomen: Bowel sounds are present in all four quadrants.  Abdomen is soft, non-tender, nondistended.  No hepatosplenomegaly appreciated.  Extremities: Peripheral pulses intact without edema.  Skin: Warm, dry, intact without rash.  Neuro/Psych: Alert and oriented x 3 and fully ambulatory.  Pleasant appropriate affect and normal thought content.   Venous Access: R TL PI TriFusion intact and dressed without evidence of infection.     Labs: Recent Results (from the past 48 hours)  Comprehensive Metabolic Panel   Collection Time: 11/11/23  9:25 AM  Result Value Ref Range   Sodium 142 136 - 145 mmol/L   Potassium 3.7 3.5 - 5.1 mmol/L   Chloride 113 (H) 98 - 107 mmol/L   CO2 24 21 - 31 mmol/L   Anion Gap 5 (L) 6 - 14 mmol/L   Glucose, Random 115 (H) 70 - 99 mg/dL   Blood Urea Nitrogen (BUN) 9 7 - 25 mg/dL   Creatinine 9.32 9.39 - 1.20 mg/dL   eGFR >09 >40 fO/fpw/8.26f7   Albumin 3.5 3.5 - 5.7 g/dL   Total Protein 5.0 (L) 6.4 - 8.9 g/dL   Bilirubin, Total 0.9 0.3 - 1.0 mg/dL   Alkaline Phosphatase (ALP) 53 34 - 104 U/L   Aspartate Aminotransferase (AST) 17 13 - 39 U/L   Alanine Aminotransferase (ALT) 10 7 - 52 U/L   Calcium  7.7 (L) 8.6 - 10.3 mg/dL   BUN/Creatinine Ratio    Magnesium   Collection Time: 11/11/23  9:25 AM  Result Value Ref Range   Magnesium 1.6 (L) 1.9 - 2.7 mg/dL  CBC without Differential   Collection Time: 11/11/23  9:26 AM  Result Value Ref Range   WBC 0.00 (L) 4.40 - 11.00 10*3/uL   RBC 3.08 (L) 4.10 - 5.10 10*6/uL   Hemoglobin 9.5 (L) 12.3 - 15.3 g/dL   Hematocrit 72.1 (L) 64.0 - 44.6 %   Mean  Corpuscular Volume (MCV) 90.2 80.0 - 96.0 fL   Mean Corpuscular Hemoglobin (MCH) 31.0 27.5 - 33.2 pg   Mean Corpuscular Hemoglobin Conc (MCHC) 34.4 33.0 - 37.0 g/dL   Red Cell Distribution Width (RDW) 16.4 12.3 - 17.0 %   Platelet Count (PLT) 31 (L) 150 - 450 10*3/uL   Mean Platelet Volume (MPV) 7.2 6.8 - 10.2 fL  Basic Metabolic Panel   Collection Time: 11/12/23  8:55 AM  Result Value Ref Range   Sodium 143 136 - 145 mmol/L   Potassium 3.5 3.5 - 5.1 mmol/L   Chloride 113 (H) 98 - 107 mmol/L   CO2 23 21 - 31 mmol/L  Anion Gap 7 6 - 14 mmol/L   Glucose, Random 112 (H) 70 - 99 mg/dL   Blood Urea Nitrogen (BUN) 10 7 - 25 mg/dL   Creatinine 9.16 9.39 - 1.20 mg/dL   eGFR 75 >40 fO/fpw/8.26f7   Calcium  7.7 (L) 8.6 - 10.3 mg/dL   BUN/Creatinine Ratio    Magnesium   Collection Time: 11/12/23  8:55 AM  Result Value Ref Range   Magnesium 1.6 (L) 1.9 - 2.7 mg/dL  Lactic Acid   Collection Time: 11/12/23  9:13 AM  Result Value Ref Range   Lactic Acid 0.8 0.5 - 2.2 mmol/L   Radiology Results (last 7 days)     ** No results found for the last 168 hours. **        Active Problems: Present on Admission: . Neutropenic fever . Multiple myeloma not having achieved remission    (CMD) . Mucositis due to chemotherapy . Diarrhea . Nasal congestion . Cough in adult   ASSESSMENT and PLAN:  IgG Kappa Multiple Myeloma undergoing autologous stem cell transplant in VGPR 07/09 - Day -1. Melphalan 200 mg/m2. Education of outpatient transplant process provided today.  07/10 - Day 0. Stem cell re-infusion. 07/14 - Day +4.  Await nadir followed by engraftment.  Continue supportive care.  07/18 - Admit for continuation of care in setting of neutropenic fever, source unclear.  Neutropenic Fever, Etiology Unclear Admit.  Infectious work up in process.  BC, UA, UC, RVP, CXR, KUB based on symptoms (reported cough, nasal congestion, progressive diarrhea, mucositis).  Empiric Zosyn for  now.  Chemotherapy Induced Mucosal Barrier Injury, Grade 1 MMW swish and swallow.  Oxycodone  5-10 mg PRN pain.  Diarrhea, Etiology Unclear Infectious testing pending sample.  If non-infectious/chemotherapy induced, will use antidiarrheals to manage.  Disease & Chemotherapy Induced Pancytopenia Monitor counts daily.  Transfuse PRN per protocol.  CINV Antiemetics IV PRN for breakthrough N/V.  FEN Oral hydration for now.  Electrolytes per protocol.  House select diet.  Prophylaxis  ID Acyclovir  for HSV/VZV. Zosyn as above for bacterial. Fluconazole for fungal.   VTE Contraindicated d/t thrombocytopenia.  GI Protonix for now.  Expand All Collapse All  OUTPATIENT AUTOLOGOUS PERIPHERAL BLOOD STEM CELL TRANSPLANT TRANSPLANT COURSE VISIT     Synopsis: Joanna Ray is a 73 y.o. female with known history of IgG kappa multiple myeloma in a VGPR after most recent treatment with D-RVD x 6 cycles.  Presenting now for outpatient autologous peripheral blood stem cell transplant with preparative melphalan 200 mg/m2 followed by stem cell infusion on 11/04/2023.  Full oncology history as below.      Oncology History  Multiple myeloma not having achieved remission    (CMD)  09/09/2023 Initial Diagnosis    Multiple myeloma not having achieved remission    (CMD)    10/25/2023 - 10/26/2023 Supportive Treatment    OP ONC Supportive GCSF (FILGRASTIM / PLERIXAFOR) for STEM cell mobilization Plan Provider: Norleen Debby Funk, MD Treatment goal: Supportive Line of treatment: [No plan line of treatment]    10/26/2023 - 10/27/2023 Supportive Treatment    BMT Apheresis Mobilized Collection Orders Plan Provider: Norleen Debby Funk, MD    10/27/2023 -  Supportive Treatment    Intravenous Fluids Twice Weekly Plan Provider: Norleen Debby Funk, MD    11/01/2023 -  Supportive Treatment    Adult OP Blood Administration PRN x 30 DAYS Plan Provider: Norleen Debby Funk, MD    11/02/2023 -  Oncology Treatment     Protocol TCT -  [facility] OP Autologous Melphalan - Commercial   Chemotherapy/Immunotherapy Medications melphalan (ALKERAN) 390 mg in sodium chloride  0.9 % 195 mL (2 mg/mL) chemo IVPB, 200 mg/m2 = 390 mg, intravenous Administration: 390 mg (11/03/2023)   [Plan is still active]    11/02/2023 -  Supportive Treatment    Adult Peripheral IV and Central Venous Access Orders Plan Provider: Norleen Debby Funk, MD    11/04/2023 Transplant    AUTOLOGOUS STEM CELL TRANSPLANT: Disease status at time of transplant: VGPR ASBMT risk stratification: ___ risk, ECOG/KPS: 1 / 80%, HSCT-CI score: 3 Started mobilization on 10/22/23 using GCSF and Mozobil. She collected peripheral blood stem cells on 10/26/23 and 10/27/23.  Preparative regimen with Melphalan 200 mg/m2 on 11/03/23 (outpatient). Infusion of stem cells - _____ x 10(6) on 11/04/23 (outpatient)   Engraftment: WBC engraftment on _____ and platelet engraftment on _____. Last platelet transfusion given _____.           Subjective: Interval History:  Today is day +5 of outpatient autologous peripheral blood stem cell transplant for IgG Kappa Multiple Myeloma. Continues doing well overall.  No change to awareness in her throat when swallowing pills and food; still there but not worse.  Peripheral neuropathy is stable.  A complete ROS was performed with pertinent positives/negatives noted. The remainder of the ROS is negative.   [Allergies]  [Allergies]     Allergen Reactions  . Shellfish Containing Products Swelling  . Corn Containing Products Rash     [Current Rx ordered in [facility]]  [Current Rx ordered in [facility]]       Meds Ordered in [facility]  Medication Sig Dispense Refill  . acyclovir  (ZOVIRAX ) 400 mg tablet Take 2 tablets (800 mg total) by mouth 2 times a day. 120 tablet 11  . ALPRAZolam  (XANAX ) 0.5 mg tablet Take 0.5 mg by mouth 2 (two) times a day as needed for anxiety.      . amLODIPine  (NORVASC ) 5 mg tablet Take 5 mg by mouth daily.       . EPINEPHrine  (EPIPEN ) 0.3 mg/0.3 mL injection syringe Inject 0.3 mg into the thigh as needed for anaphylaxis.      . ergocalciferol  (VITAMIN D2) 1,250 mcg (50,000 unit) capsule Take 50,000 Units by mouth once a week.      . fexofenadine  (ALLEGRA ) 180 mg tablet Take 180 mg by mouth daily as needed (allergies).      . fluconazole (DIFLUCAN) 200 mg tablet Take 2 tablets (400 mg total) by mouth daily. 30 tablet 0  . gabapentin  (NEURONTIN ) 300 mg capsule Take 600 mg by mouth 3 (three) times a day.      . hydrocortisone valerate (WESTCORT) 0.2 % cream Apply topically 2 (two) times a day as needed.      SABRA levoFLOXacin (LEVAQUIN) 500 mg tablet Take 1 tablet (500 mg total) by mouth daily. 14 tablet 0  . loratadine (CLARITIN) 10 mg tablet Take 10 mg by mouth After dialysis (bone pain).      . olmesartan  (BENICAR ) 20 mg tablet Take 20 mg by mouth daily.      . ondansetron  (ZOFRAN ) 8 mg tablet Take 1 tablet (8 mg total) by mouth every 8 hours as needed for nausea or vomiting. 30 tablet 1  . oxyCODONE  (ROXICODONE ) 5 mg immediate release tablet Take 1 tablet (5 mg total) by mouth every 8 (eight) hours as needed (Pain). Do not take with hydrocodone -acetaminophen  30 tablet 0  . polyethylene glycol (MIRALAX) 17 gram powd powder Take 17 g by  mouth daily. 510 g 0  . potassium chloride  (MICRO-K ) 10 mEq CR capsule Take 2 capsules (20 mEq total) by mouth 2 (two) times a day. 120 capsule 0  . prochlorperazine  (COMPAZINE ) 5 mg tablet Take 1 tablet (5 mg total) by mouth every 6 hours as needed for nausea or vomiting. 30 tablet 0  . senna (SENOKOT) 8.6 mg tablet Take 2 tablets (17.2 mg total) by mouth nightly as needed for constipation.      [facility] magic mouthwash Take 30 mL by mouth 4 (four) times a day as needed (Swish and Swallow). 480 mL 0  . HYDROcodone -acetaminophen  (NORCO) 5-325 mg per tablet Take 1 tablet by mouth. (Patient not taking: Reported on 11/09/2023)      . rosuvastatin  (CRESTOR ) 20 mg tablet Take 20 mg by  mouth Once Daily. (Patient not taking: Reported on 11/09/2023)                 Current Facility-Administered Medications Ordered in [facility]  Medication Dose Route Frequency Provider Last Rate Last Admin  . alum-mag hydroxide-simethicone (MAALOX, MYLANTA) 200-200-20 mg/5 mL suspension 5 mL  5 mL oral Once PRN Trenda Tawanna Blanch, PharmD, CPP        And  . diphenhydrAMINE  (BENADRYL ) 12.5 mg/5 mL liquid 12.5 mg  12.5 mg oral Once PRN Trenda Tawanna Blanch, PharmD, CPP      . filgrastim-aafi (NIVESTYM) injection 480 mcg  480 mcg subcutaneous Once Norleen Debby Funk, MD      . heparin flush 10 unit/mL injection 50 Units  50 Units intra-catheter PRN Norleen Debby Funk, MD      . ondansetron  (ZOFRAN ) injection 4 mg  4 mg intravenous Daily PRN Norleen Debby Funk, MD      . prochlorperazine  (COMPAZINE ) injection 5 mg  5 mg intravenous Daily PRN Norleen Debby Funk, MD      . sodium chloride  0.9 % infusion  333 mL/hr intravenous Continuous Norleen Debby Funk, MD 333 mL/hr at 11/09/23 0939 333 mL/hr at 11/09/23 0939     Objective: Vital signs in last 24 hours: Temp:  [98.5 F (36.9 C)-99.3 F (37.4 C)] 99.3 F (37.4 C) Heart Rate:  [84-89] 84 Resp:  [14-15] 15 BP: (128-140)/(67-78) 140/78   Physical Exam BP 140/78 (BP Location: Left arm, Patient Position: Lying)   Pulse 84   Temp 99.3 F (37.4 C) (Oral)   Resp 15   Wt 83 kg (183 lb)   SpO2 100%   BMI 29.59 kg/m  General: Well developed, well nourished female in no acute distress.  Accompanied by 2 adult females.   Head-Eyes-Ears-Nose-Throat: No scleral icterus or injection. PER. Nares and oropharynx are clear without erythema or exudate.  Mucous membranes are pink and dry without ulcerations.    Cardiovascular: Regular rate and regular rhythm without murmurs, rubs or gallops.  Chest/Lungs: Clear to auscultation throughout without wheezes, rhonchi or crackles.  No cough observed.  Abdomen: Bowel sounds are present in all four quadrants.  Abdomen is  soft, non-tender, nondistended.  No hepatosplenomegaly appreciated.  Extremities: Peripheral extremities warm to touch with 1+ BLE edema.  Skin: Warm, dry, intact without rash.  Neuro/Psych: Alert and oriented x 3 and fully ambulatory.  Pleasant appropriate affect and normal thought content.   Venous Access: R TL PI TriFusion intact and dressed without evidence of infection.      Diagnostic Studies: Recent Results       Recent Results (from the past 24 hours)  Comprehensive Metabolic Panel  Collection Time: 11/09/23  9:43 AM  Result Value Ref Range    Sodium 140 136 - 145 mmol/L    Potassium 4.1 3.5 - 5.1 mmol/L    Chloride 108 (H) 98 - 107 mmol/L    CO2 25 21 - 31 mmol/L    Anion Gap 7 6 - 14 mmol/L    Glucose, Random 107 (H) 70 - 99 mg/dL    Blood Urea Nitrogen (BUN) 13 7 - 25 mg/dL    Creatinine 9.25 9.39 - 1.20 mg/dL    eGFR 86 >40 fO/fpw/8.26f7    Albumin 3.6 3.5 - 5.7 g/dL    Total Protein 5.1 (L) 6.4 - 8.9 g/dL    Bilirubin, Total 1.0 0.3 - 1.0 mg/dL    Alkaline Phosphatase (ALP) 56 34 - 104 U/L    Aspartate Aminotransferase (AST) 18 13 - 39 U/L    Alanine Aminotransferase (ALT) 8 7 - 52 U/L    Calcium  8.1 (L) 8.6 - 10.3 mg/dL    BUN/Creatinine Ratio      Magnesium    Collection Time: 11/09/23  9:43 AM  Result Value Ref Range    Magnesium 1.7 (L) 1.9 - 2.7 mg/dL  CBC without Differential    Collection Time: 11/09/23  9:43 AM  Result Value Ref Range    WBC 0.20 (L) 4.40 - 11.00 10*3/uL    RBC 3.26 (L) 4.10 - 5.10 10*6/uL    Hemoglobin 10.1 (L) 12.3 - 15.3 g/dL    Hematocrit 70.3 (L) 35.9 - 44.6 %    Mean Corpuscular Volume (MCV) 90.9 80.0 - 96.0 fL    Mean Corpuscular Hemoglobin (MCH) 31.1 27.5 - 33.2 pg    Mean Corpuscular Hemoglobin Conc (MCHC) 34.2 33.0 - 37.0 g/dL    Red Cell Distribution Width (RDW) 16.9 12.3 - 17.0 %    Platelet Count (PLT) 98 (L) 150 - 450 10*3/uL    Mean Platelet Volume (MPV) 7.2 6.8 - 10.2 fL          ASSESSMENT and PLAN:   IgG  Kappa Multiple Myeloma undergoing autologous stem cell transplant in VGPR 07/09 - Day -1. Melphalan 200 mg/m2. Education of outpatient transplant process provided today.  07/10 - Day 0. Stem cell re-infusion. 07/14 - Day +4.  Await nadir followed by engraftment.  Continue supportive care.  07/15 - Day +5  - Await nadir followed by engraftment.  Continue supportive care, including GCSF.  Reviewed Claritin with GCSF for bony pain.   Chemotherapy Induced Pancytopenia Monitor counts daily.  Transfuse PRN per protocol.   Chemotherapy Induced Mucositis, Grade 1 Eat soft, non-spicy foods.  Continue saline rinses and good oral hygiene.  Pick up MMW at pharmacy as soon as available; swish and swallow QID PRN.  Dose provided again in clinic today for immediate use.   CINV Antiemetics per treatment plan and PRN for breakthrough N/V.   FEN Fluids per treatment plan.  Electrolytes per protocol.  DAT.   ID Prophylaxis Acyclovir  for HSV/VZV.  Levofloxacin for bacterial.  Fluconazole for fungal.   Co-Morbidities (HCT-CI Score 3)   HTN Continue Olmesartan  20mg  daily (TI for Losartan 50 mg) and amlodipine  5mg  daily. Hold lasix  20mg  daily for lower extremity swelling with potassium 20 mEq BID in setting of diarrhea.  Manage electrolytes as above.                Pulmonary (reduced DLCO) (2) Encourage Incentive Spirometry.   Age Over 65 (1)  No intervention.               Neuropathy Continue gabapentin  300mg  TID. Continue Oxycodone  5mg  q8h in place of Norco during transplant period.               HLD             Hold statin during peri-transplant period.                Anxiety Continue Xanax  0.5mg  BID PRN.      Ethics Full code.       [1] Past Medical History: Diagnosis Date  . High cholesterol   . Hypertension   . Pseudophakia of both eyes   [2] Past Surgical History: Procedure Laterality Date  . CATARACT EXTRACTION Left 01/26/2018   Procedure: YAG CAPSULOTOMY  .  CATARACT EXTRACTION W/  INTRAOCULAR LENS IMPLANT Right 06/30/2017   Procedure: CATARACT EXTRACTION W/ INTRAOCULAR LENS IMPLANT; SN60WF 19.5  . CATARACT EXTRACTION W/  INTRAOCULAR LENS IMPLANT Left 07/21/2017   Procedure: CATARACT EXTRACTION W/ INTRAOCULAR LENS IMPLANT; SN60WF 20.0  . HUMERAL HEMIARTHROPLASTY Right 05/01/2021   Procedure: PROXIMAL HUMERUS HEMIARTHROPLASTY - STRYKER;  Surgeon: Montie Macario Cable, MD;  Location: [facility] MAIN OR;  Service: Orthopedics;  Laterality: Right;  . RESECTION TUMOR PROXIMAL HUMERUS RADICAL Right 05/01/2021   Procedure: RADICAL RESECTION OF BONE TUMOR RIGHT PROXIMAL HUMERUS;  Surgeon: Montie Macario Cable, MD;  Location: [facility] MAIN OR;  Service: Orthopedics;  Laterality: Right;  beach chair, 4 hrs total, Stryker proximal humerus  [3] Family History Problem Relation Name Age of Onset  . Hypertension Mother    . Stroke Mother    . Cataracts Mother    . Hypertension Father    . Stroke Father    . Diabetes Sister    . Thyroid  disease Sister    . Cancer Maternal Grandmother    . Glaucoma Neg Hx    . Macular degeneration Neg Hx    . Retinal detachment Neg Hx    . Strabismus Neg Hx    [4] Allergies Allergen Reactions  . Shellfish Containing Products Swelling  . Corn Containing Products Rash

## 2023-11-15 ENCOUNTER — Inpatient Hospital Stay

## 2023-11-15 ENCOUNTER — Inpatient Hospital Stay: Admitting: Internal Medicine

## 2023-11-15 NOTE — Progress Notes (Signed)
 Case Management Screening  CSN: [id number] DOB: Aug 23, 1950 Service: Bone Marrow Transplant Location: C711/A   Initial Screening Readmission Risk Score v2: 21.5 Risk Level: High - Patient meets high risk criteria for post hospital services. Assessment to be completed. Patient has new or exacerbation of functional deficits:: Yes  Per chart review, Yury Schaus is a 73 y.o. female with known history of IgG kappa multiple myeloma in a VGPR after most recent treatment with D-RVD x 6 cycles.  Undergoing outpatient autologous peripheral blood stem cell transplant since 11/03/2023 with preparative melphalan 200 mg/m2 followed by stem cell infusion on 11/04/2023. Presented to OP clinic today with neutropenic fever with ongoing grade 1 mucositis/esophagitis, ongoing frothy sputum without observed cough, progressive diarrhea with 5 stools overnight without abdominal pain.  Admitting for evaluation and management of neutropenic fever.  Discharge needs to be determined;  MSW will continue to follow and support for emerging needs.  Montie Bramble, MSW Social Worker [phone] (office)

## 2023-11-15 NOTE — Group Note (Signed)
 Inpatient Care Coordination Team Conference Note  11/15/2023   Time:7:21 AM   CSN: [id number]  DOB: 02-28-1951   Room/Bed: C711/A LOS: 3 Payor Info: Payor: HU[employer]NA MEDICARE ADV / Plan: HUMANA [place] / Product Type: Medicare Advantage /    Admitting Diagnosis: Neutropenic fever [D70.9, R50.81]  Admit Date/Time: 11/12/2023  3:06 PM Admission Comments: No comment available   Primary Diagnosis: Neutropenic fever Principal Problem: Neutropenic fever  Predictive Model Details       17% (Medium) Factor Value   Risk of Unplanned Readmission Model Number of active inpatient medication orders 38    Diagnosis of cancer present    ECG/EKG order present in last 6 months    Latest calcium  low (7.4 mg/dL)    Imaging order present in last 6 months    Latest hemoglobin low (8.1 g/dL)    Phosphorous result present    Age 73    Active anticoagulant inpatient medication order present    Current length of stay 2.547 days    Charlson Comorbidity Index 2    Future appointment scheduled    Active ulcer inpatient medication order present     Team Members Present: Advanced Practice Provider, Nurse, Nutritionist, Pharmacist, Provider, Social Worker  Expected Discharge Date: Nov 16, 2023  Montie Bramble, MSW

## 2023-11-17 ENCOUNTER — Telehealth: Payer: Self-pay | Admitting: Internal Medicine

## 2023-11-17 NOTE — Telephone Encounter (Signed)
 Scheduled appointments with [facility] hospital. She stated she would make the patient aware.

## 2023-11-19 NOTE — Discharge Summary (Signed)
 SCTCT Inpatient Discharge Summary   Admitting Provider: Donnice Fairy Blalock, MD Discharge Provider: Breck Beverley Barrio, MD  Admission Date: 11/12/2023      Discharge Date: 11/19/2023  Condition on Discharge: Stable Discharge Disposition: Discharged to: home  Presenting Problem Neutropenic fever [D70.9, R50.81] Diarrhea  Hospital Course Joanna Ray has a history of IgG kappa multiple myeloma and is sp autologous stem cell transplant with Melphalan preparative regimen. Stem cell infusion was on 11/04/2023. She was admitted 11/12/2023 with neutropenic fever.   Problem focused hospital course: Neutropenic fever. Treated with empiric Zosyn. Cultures, imaging negative. RVP + for parainfluenza. Zosyn dc'd on 7/22 with WBC engraftment. Chemotherapy induced grade 2 mucositis with associated swallowing difficulty. Speech evaluation with modified barium swallow (MBS) resulted in dysphagia diet and meds given by solution, crushing or IV route. Symptoms improved with WBC engraftment and repeat MBS on 7/23 improved. Advanced to regular diet with oral meds/pills without difficulty. Culture negative diarrhea. Managed with hydration and prn imodium. Hypernatremia: likely due to dehydration/poor nutritional status. Improved with hydration. Encouraged oral fluid hydration at  discharge.  WBC engraftment (Day 1) occurred on 7/21 with last dose of filgrastim given on 7/22. Last platelet transfusion occurred on 7/19 with anticipated platelet engraftment on 11/20/2023.  Joanna Ray was discharged on 11/19/2023 to close outpatient follow-up: lab evaluation on 7/26 and on 11/22/2023  Current status: Afebrile and continue to improve. Slow increase in po intake. Taking pills without difficulty. Diarrhea improving and no n/v. Denies cough, congestion, dyspnea.  All systems reviewed and are as above; otherwise negative.   PHYSICAL EXAMINATION: BP 138/77 (BP Location: Left arm, Patient Position: Lying)    Pulse 88   Temp 98.4 F (36.9 C) (Oral)   Resp 16   Ht 1.674 m (5' 5.9)   Wt 80.9 kg (178 lb 6.4 oz)   SpO2 100%   BMI 28.88 kg/m   GENERAL: Joanna Ray is a 73 y.o. female.  Well developed, well nourished female in no acute distress.  Alert and oriented x 3 and fully ambulatory. Accompanied by adult female.  HEENT: No scleral icterus or injection. Oropharynx are clear without erythema or exudate.  Mucous membranes are pale, moist.  Neck is supple. CARDIOVASCULAR: Regular rate and regular rhythm without murmurs, rubs or gallops. LUNGS:  Clear to auscultation throughout without wheezes, rhonchi or crackles.  No increased effort of breathing.  No cough observed. ABDOMEN: Abdomen is soft, non-tender, nondistended, with no guarding. EXTREMITIES: No swelling, erythema, edema or tenderness to bilateral lower extremities. SKIN:  Warm, dry, intact without rash.   NEUROLOGIC: No focal deficits. PSYCH: Pleasant appropriate affect and normal thought content.   Treatments: antibiotics: Zosyn and imodium prn for diarrhea Consults: none Procedures: removal of trifusion CVC on 7/24 Pertinent Test Results: RVP - +parainfluenza 2  Test Results at Discharge Lab Results (last 24 hours)     Procedure Component Value Ref Range Date/Time   Comprehensive Metabolic Panel [[id number]]  (Abnormal) Collected: 11/19/23 0451   Lab Status: Final result Specimen: Blood from Venous Updated: 11/19/23 0608    Sodium 146* 136 - 145 mmol/L     Potassium 3.7 3.5 - 5.1 mmol/L     Comment: NO VISIBLE HEMOLYSIS      Chloride 116* 98 - 107 mmol/L     CO2 23 21 - 31 mmol/L     Anion Gap 7 6 - 14 mmol/L     Glucose, Random 93 70 - 99 mg/dL     Blood Urea Nitrogen (  BUN) 4* 7 - 25 mg/dL     Creatinine 9.23 9.39 - 1.20 mg/dL     eGFR 83 >40 fO/fpw/8.26f7     Comment: GFR estimated by CKD-EPI equations(NKF 2021).   Recommend confirmation of Cr-based eGFR by using Cys-based eGFR and other filtration markers (if  applicable) in complex cases and clinical decision-making, as needed.      Albumin 2.9* 3.5 - 5.7 g/dL     Total Protein 4.4* 6.4 - 8.9 g/dL     Bilirubin, Total 0.3 0.3 - 1.0 mg/dL     Alkaline Phosphatase (ALP) 70 34 - 104 U/L     Aspartate Aminotransferase (AST) 23 13 - 39 U/L     Alanine Aminotransferase (ALT) 20 7 - 52 U/L     Calcium  7.4* 8.6 - 10.3 mg/dL     BUN/Creatinine Ratio --    Comment: Creatinine is normal, ratio is not clinically indicated.       Corrected Calcium  8.3 mg/dL     Comment: Reference Ranges Not Established. Recommend testing using ionized calcium .     Magnesium [[id number]]  (Abnormal) Collected: 11/19/23 0451   Lab Status: Final result Specimen: Blood from Venous Updated: 11/19/23 0608    Magnesium 1.7* 1.9 - 2.7 mg/dL    Phosphorus [[id number]]  (Normal) Collected: 11/19/23 0451   Lab Status: Final result Specimen: Blood from Venous Updated: 11/19/23 0608    Phosphorus 2.6 2.5 - 5.0 mg/dL    CBC with Differential [[id number]]  (Abnormal) Collected: 11/19/23 0451   Lab Status: Final result Specimen: Blood from Venous Updated: 11/19/23 9075   Narrative:     The following orders were created for panel order CBC with Differential. Procedure                               Abnormality         Status                    ---------                               -----------         ------                    CBC with Differential[[id number]]       Abnormal            Final result               Please view results for these tests on the individual orders.   CBC with Differential [[id number]]  (Abnormal) Collected: 11/19/23 0451   Lab Status: Final result Specimen: Blood from Venous Updated: 11/19/23 0924    WBC 4.80 4.40 - 11.00 10*3/uL     RBC 2.84* 4.10 - 5.10 10*6/uL     Hemoglobin 8.4* 12.3 - 15.3 g/dL     Hematocrit 74.8* 64.0 - 44.6 %     Mean Corpuscular Volume (MCV) 88.5 80.0 - 96.0 fL     Mean Corpuscular Hemoglobin (MCH) 29.7 27.5 - 33.2 pg     Mean  Corpuscular Hemoglobin Conc (MCHC) 33.5 33.0 - 37.0 g/dL     Red Cell Distribution Width (RDW) 16.6 12.3 - 17.0 %     Platelet Count (PLT) 48* 150 - 450 10*3/uL     Mean Platelet Volume (MPV) 10.5* 6.8 - 10.2  fL     Neutrophils % 52 %     Lymphocytes % 4 %     Monocytes % 32 %     Eosinophils % 0 %     Basophils % 0 %     Bands % 1 <5 %     Promyelocytes % 3 %     Metamyelocytes % 4 %     Myelocyte % 4 %     Neutrophil Absolute (Man Diff) 2.50 1.80 - 7.80 10*3/uL     Lymphocytes Absolute (Man Diff) 0.20* 1.00 - 4.80 10*3/uL     Monocytes Absolute (Man Diff) 1.50* 0.00 - 0.80 10*3/uL     Eosinophils Absolute (Man Diff) 0.00 0.00 - 0.50 10*3/uL     Basophils Absolute (Man Diff) 0.00 0.00 - 0.20 10*3/uL     Bands Absolute (Man Diff) 0.00 10*3/uL     Promyelocyte Absolute (Man Diff) 0.14 10*3/uL     Metamyelocytes Absolute (Man Diff) 0.20 10*3/uL     Myelocytes Absolute (Man Diff) 0.20 10*3/uL     RBC & PLT Morphology Reviewed    Echinocytes (Burr) 1+    Ovalocytes 1+    Poikilocytes 1+    Schistocytes (RBC Fragments) 1+        Discharge Medications:   Medication List     PAUSE taking these medications    rosuvastatin  20 mg tablet Wait to take this until your doctor or other care provider tells you to start again. Commonly known as: CRESTOR  Take 10 mg by mouth daily.       START taking these medications    famotidine  20 mg tablet Commonly known as: PEPCID  Take 1 tablet (20 mg total) by mouth 2 (two) times a day.   folic acid 1 mg tablet Commonly known as: FOLVITE Take 1 tablet (1 mg total) by mouth daily.   loperamide 2 mg capsule Commonly known as: IMODIUM Take 4 mg (2 capsules) once, followed by 2 mg (1 capsule) after each loose stool for up to a maximum of 16 mg (8 capsules) per day.   multivitamin Tab tablet Commonly known as: THERAGRAN Take 1 tablet by mouth daily.   sulfamethoxazole-trimethoprim 800-160 mg per tablet Commonly known as: BACTRIM  DS Take 1 tablet by mouth every Monday, Wednesday and Friday. Begin taking on Day +29 (12/03/23) and continue until 6 months (05/02/24).       CHANGE how you take these medications    gabapentin  300 mg capsule Commonly known as: NEURONTIN  Take 2 capsules (600 mg total) by mouth 3 (three) times a day. What changed: how much to take       CONTINUE taking these medications    acyclovir  400 mg tablet Commonly known as: ZOVIRAX  Take 2 tablets (800 mg total) by mouth 2 times a day.   ALPRAZolam  0.5 mg tablet Commonly known as: XANAX  Take 0.5 mg by mouth as needed in the morning and 0.5 mg as needed in the evening for anxiety.   amLODIPine  5 mg tablet Commonly known as: NORVASC  Take 5 mg by mouth daily.   EPINEPHrine  0.3 mg/0.3 mL injection syringe Commonly known as: EPIPEN  Inject 0.3 mg into the thigh as needed for anaphylaxis.   ergocalciferol  1,250 mcg (50,000 unit) capsule Commonly known as: VITAMIN D2 Take 50,000 Units by mouth once a week.   fexofenadine  180 mg tablet Commonly known as: ALLEGRA  Take 180 mg by mouth daily as needed (allergies).   hydrocortisone valerate 0.2 % cream Commonly known as:  WESTCORT Apply topically 2 (two) times a day as needed.   loratadine 10 mg tablet Commonly known as: CLARITIN Take 10 mg by mouth daily. For bone pain   olmesartan  20 mg tablet Commonly known as: BENICAR  Take 20 mg by mouth daily.   ondansetron  8 mg tablet Commonly known as: ZOFRAN  Take 1 tablet (8 mg total) by mouth every 8 hours as needed for nausea or vomiting.   oxyCODONE  5 mg immediate release tablet Commonly known as: ROXICODONE  Take 1 tablet (5 mg total) by mouth every 8 (eight) hours as needed (Pain). Do not take with hydrocodone -acetaminophen    polyethylene glycol 17 gram Powd powder Commonly known as: MIRALAX Mix 1 measured capful(17g) in 4-8 ounces of water or juice. Stir until dissolved and drink daily as directed. (Take 17 g by mouth daily.)    potassium chloride  10 mEq CR capsule Commonly known as: MICRO-K  Take 2 capsules (20 mEq total) by mouth 2 (two) times a day.   prochlorperazine  5 mg tablet Commonly known as: COMPAZINE  Take 1 tablet (5 mg total) by mouth every 6 hours as needed for nausea or vomiting.   senna 8.6 mg tablet Commonly known as: SENOKOT Take 2 tablets (17.2 mg total) by mouth nightly as needed for constipation.       STOP taking these medications    fluconazole 200 mg tablet Commonly known as: DIFLUCAN   levoFLOXacin 500 mg tablet Commonly known as: LEVAQUIN   [facility] magic mouthwash         Where to Get Your Medications     These medications were sent to [facility] [address] [place] [address]    Hours: Open Monday 12am to Friday 11:59pm; Sat-Sun: Closed; Holidays: Closed Thanksgiving Phone: [phone]  famotidine  20 mg tablet folic acid 1 mg tablet gabapentin  300 mg capsule multivitamin Tab tablet sulfamethoxazole-trimethoprim 800-160 mg per tablet    You can get these medications from any pharmacy   You don't need a prescription for these medications loperamide 2 mg capsule     Follow Up Appointments: [facility] [place]  [phone]   This discharge process has taken 40 minutes.  ATTENDING NOTE:  SHARED DAILY VISIT WITH [facility], ARNP  I have personally interviewed and examined Joanna Ray.  I have discussed the plans for evaluation and management with Joanna Ray and the above notes accurately reflect my assessment and the plans generated with me.   Joanna Ray is a 76 you w/ IgGk MM who is D+15 post auto PBSCT.  She was admitted from OP SCTCT clinic for NF, mucositis and diarrhea.  She was noted to have paraflu.  She had significant diarrhea leading to dehydration and hypernatremia.  At the time of discharge she has been off fluids for 24 hrs and drinking po on her own - Na increased only slightly and her diarrhea  she reports as improving.  She only took imodium twice.  We discussed use of imodium to help prevent dehydration and control diarrheal symptoms until she has had enough time to recover from the impact of her treatment.  We are giving a bolus of saline today and then she will return to clinic tomorrow to check labs and evaluate her fluid status.  She is asymptomatic at this time.  She is tolerating pills.  Trifusion was removed.  She has poor access.  This may become a problem for supportive care in the outpatient.    We reviewed discharge plans  and followup schedule with patient.  She was given an opportunity to ask questions. In addition to returning tomorrow for assessment of labs, she will return to clinic 8/8 for D30 assessment.  We spent about 40 min making discharge arrangements, followup appts, reconciling medications and counseling patient regarding medications and outpatient care expectations.     Electronically signed by:  Breck Beverley Barrio, MD 11/20/2023 4:25 PM    *Some images could not be shown.

## 2023-11-20 DIAGNOSIS — Z20822 Contact with and (suspected) exposure to covid-19: Secondary | ICD-10-CM | POA: Diagnosis not present

## 2023-11-20 DIAGNOSIS — E876 Hypokalemia: Secondary | ICD-10-CM | POA: Diagnosis not present

## 2023-11-20 DIAGNOSIS — T451X5A Adverse effect of antineoplastic and immunosuppressive drugs, initial encounter: Secondary | ICD-10-CM | POA: Diagnosis not present

## 2023-11-20 DIAGNOSIS — Z9484 Stem cells transplant status: Secondary | ICD-10-CM | POA: Diagnosis not present

## 2023-11-20 DIAGNOSIS — R112 Nausea with vomiting, unspecified: Secondary | ICD-10-CM | POA: Diagnosis not present

## 2023-11-20 DIAGNOSIS — K1231 Oral mucositis (ulcerative) due to antineoplastic therapy: Secondary | ICD-10-CM | POA: Diagnosis not present

## 2023-11-20 DIAGNOSIS — Z7682 Awaiting organ transplant status: Secondary | ICD-10-CM | POA: Diagnosis not present

## 2023-11-20 DIAGNOSIS — C9000 Multiple myeloma not having achieved remission: Secondary | ICD-10-CM | POA: Diagnosis not present

## 2023-11-20 DIAGNOSIS — Z52011 Autologous donor, stem cells: Secondary | ICD-10-CM | POA: Diagnosis not present

## 2023-11-20 NOTE — Progress Notes (Signed)
 Erminio Gander arrived to outpatient BMT clinic at 1200 for lab check following inpatient discharge yesterday. Labs drawn without difficulty. Labs reviewed with Dr. Norleen Funk, no interventions indicated.   Pt did mention right hand swelling and continuing congestion. Continue to monitor hand. Saline nasal spray for congestion.   Caregiver and patient voiced understanding.   Pt will follow up Monday 7/28. Caregiver reminded of this appointment on [facility]CC at 1030 and then come to [facility] outpatient clinic.   Pt discharged with family alert via wheelchair.

## 2023-11-22 ENCOUNTER — Inpatient Hospital Stay: Attending: Internal Medicine

## 2023-11-22 DIAGNOSIS — Z20822 Contact with and (suspected) exposure to covid-19: Secondary | ICD-10-CM | POA: Diagnosis not present

## 2023-11-22 DIAGNOSIS — Z7682 Awaiting organ transplant status: Secondary | ICD-10-CM | POA: Diagnosis not present

## 2023-11-22 DIAGNOSIS — K1231 Oral mucositis (ulcerative) due to antineoplastic therapy: Secondary | ICD-10-CM | POA: Diagnosis not present

## 2023-11-22 DIAGNOSIS — T451X5A Adverse effect of antineoplastic and immunosuppressive drugs, initial encounter: Secondary | ICD-10-CM | POA: Diagnosis not present

## 2023-11-22 DIAGNOSIS — R112 Nausea with vomiting, unspecified: Secondary | ICD-10-CM | POA: Diagnosis not present

## 2023-11-22 DIAGNOSIS — Z9484 Stem cells transplant status: Secondary | ICD-10-CM | POA: Diagnosis not present

## 2023-11-22 DIAGNOSIS — C9000 Multiple myeloma not having achieved remission: Secondary | ICD-10-CM | POA: Diagnosis not present

## 2023-11-22 DIAGNOSIS — E876 Hypokalemia: Secondary | ICD-10-CM | POA: Diagnosis not present

## 2023-11-22 DIAGNOSIS — Z52011 Autologous donor, stem cells: Secondary | ICD-10-CM | POA: Diagnosis not present

## 2023-11-22 NOTE — Progress Notes (Signed)
 Joanna Ray arrived at 1115 accompanied by her spouse (designated caregiver) to outpatient room 705CCW.    Larraine Ruth notified of arrival and she assessed her R arm d/t slight swelling at wrist. Labs reviewed.  Potassium 40 mEq and Mag +protein 266 mg PO was given for labs.  Patient discharged with family alert and ambulatory.  Discharge instructions reviewed with patient and caregiver.  Both verbalized understanding of emergency contacts and return appointment.

## 2023-11-23 ENCOUNTER — Other Ambulatory Visit: Payer: Self-pay | Admitting: Internal Medicine

## 2023-11-25 ENCOUNTER — Ambulatory Visit: Payer: Self-pay

## 2023-11-25 NOTE — Telephone Encounter (Signed)
   FYI Only or Action Required?: FYI only for provider.  Patient was last seen in primary care on 10/26/2023 by [name], MD.  Called Nurse Triage reporting Cough.  Symptoms began several days ago.  Interventions attempted: OTC medications: sore throat spray .  Symptoms are: unchanged.  Triage Disposition: See Physician Within 24 Hours  Patient/caregiver understands and will follow disposition?: Yes          Copied from CRM #[id number]. Topic: Appointments - Appointment Scheduling >> Nov 25, 2023  2:41 PM Harlene ORN wrote: got a head cold and ears have been stopped up for the past 5 days. Green mucus. Has gotten worse in the past two weeks. Wants a virtual appointment with PCP Reason for Disposition  [1] Continuous (nonstop) coughing interferes with work or school AND [2] no improvement using cough treatment per Care Advice  Answer Assessment - Initial Assessment Questions 1. ONSET: When did the cough begin?      4-5 days  2. SEVERITY: How bad is the cough today?      mod 3. SPUTUM: Describe the color of your sputum (e.g., none, dry cough; clear, white, yellow, green)     green 4. HEMOPTYSIS: Are you coughing up any blood? If Yes, ask: How much? (e.g., flecks, streaks, tablespoons, etc.)     no 5. DIFFICULTY BREATHING: Are you having difficulty breathing? If Yes, ask: How bad is it? (e.g., mild, moderate, severe)      no 6. FEVER: Do you have a fever? If Yes, ask: What is your temperature, how was it measured, and when did it start?     no 7. CARDIAC HISTORY: Do you have any history of heart disease? (e.g., heart attack, congestive heart failure)      no 8. LUNG HISTORY: Do you have any history of lung disease?  (e.g., pulmonary embolus, asthma, emphysema)     no 9. PE RISK FACTORS: Do you have a history of blood clots? (or: recent major surgery, recent prolonged travel, bedridden)     no 10. OTHER SYMPTOMS: Do you have any other symptoms?  (e.g., runny nose, wheezing, chest pain)       Ear congestion or clogged, nasal congestion  12. TRAVEL: Have you traveled out of the country in the last month? (e.g., travel history, exposures)       no  Protocols used: Cough - Acute Productive-A-AH

## 2023-11-26 ENCOUNTER — Ambulatory Visit: Admitting: Internal Medicine

## 2023-11-26 ENCOUNTER — Telehealth: Payer: Self-pay

## 2023-11-26 NOTE — Telephone Encounter (Signed)
 Copied from CRM [id number]. Topic: General - Other >> Nov 26, 2023 10:48 AM Avram MATSU wrote: Reason for CRM: pt stated her provider told her to let her know about medication she will be prescribed by another MD. Mucinex and congested nose drops/spray

## 2023-11-27 DIAGNOSIS — J122 Parainfluenza virus pneumonia: Secondary | ICD-10-CM | POA: Diagnosis not present

## 2023-11-27 DIAGNOSIS — I10 Essential (primary) hypertension: Secondary | ICD-10-CM | POA: Diagnosis not present

## 2023-11-27 DIAGNOSIS — F419 Anxiety disorder, unspecified: Secondary | ICD-10-CM | POA: Diagnosis not present

## 2023-11-27 DIAGNOSIS — C9000 Multiple myeloma not having achieved remission: Secondary | ICD-10-CM | POA: Diagnosis not present

## 2023-11-27 DIAGNOSIS — D61811 Other drug-induced pancytopenia: Secondary | ICD-10-CM | POA: Diagnosis not present

## 2023-11-27 DIAGNOSIS — T451X5D Adverse effect of antineoplastic and immunosuppressive drugs, subsequent encounter: Secondary | ICD-10-CM | POA: Diagnosis not present

## 2023-11-27 DIAGNOSIS — K1232 Oral mucositis (ulcerative) due to other drugs: Secondary | ICD-10-CM | POA: Diagnosis not present

## 2023-11-27 DIAGNOSIS — E7800 Pure hypercholesterolemia, unspecified: Secondary | ICD-10-CM | POA: Diagnosis not present

## 2023-11-27 DIAGNOSIS — G629 Polyneuropathy, unspecified: Secondary | ICD-10-CM | POA: Diagnosis not present

## 2023-12-01 DIAGNOSIS — I10 Essential (primary) hypertension: Secondary | ICD-10-CM | POA: Diagnosis not present

## 2023-12-01 DIAGNOSIS — J122 Parainfluenza virus pneumonia: Secondary | ICD-10-CM | POA: Diagnosis not present

## 2023-12-01 DIAGNOSIS — C9000 Multiple myeloma not having achieved remission: Secondary | ICD-10-CM | POA: Diagnosis not present

## 2023-12-01 DIAGNOSIS — T451X5D Adverse effect of antineoplastic and immunosuppressive drugs, subsequent encounter: Secondary | ICD-10-CM | POA: Diagnosis not present

## 2023-12-01 DIAGNOSIS — K1232 Oral mucositis (ulcerative) due to other drugs: Secondary | ICD-10-CM | POA: Diagnosis not present

## 2023-12-01 DIAGNOSIS — G629 Polyneuropathy, unspecified: Secondary | ICD-10-CM | POA: Diagnosis not present

## 2023-12-01 DIAGNOSIS — D61811 Other drug-induced pancytopenia: Secondary | ICD-10-CM | POA: Diagnosis not present

## 2023-12-01 DIAGNOSIS — F419 Anxiety disorder, unspecified: Secondary | ICD-10-CM | POA: Diagnosis not present

## 2023-12-01 DIAGNOSIS — E7800 Pure hypercholesterolemia, unspecified: Secondary | ICD-10-CM | POA: Diagnosis not present

## 2023-12-03 DIAGNOSIS — C9000 Multiple myeloma not having achieved remission: Secondary | ICD-10-CM | POA: Diagnosis not present

## 2023-12-03 DIAGNOSIS — Z9484 Stem cells transplant status: Secondary | ICD-10-CM | POA: Diagnosis not present

## 2023-12-06 DIAGNOSIS — Z9484 Stem cells transplant status: Secondary | ICD-10-CM | POA: Diagnosis not present

## 2023-12-06 DIAGNOSIS — C9000 Multiple myeloma not having achieved remission: Secondary | ICD-10-CM | POA: Diagnosis not present

## 2023-12-08 ENCOUNTER — Other Ambulatory Visit: Payer: Self-pay | Admitting: Internal Medicine

## 2023-12-08 DIAGNOSIS — R600 Localized edema: Secondary | ICD-10-CM

## 2023-12-09 DIAGNOSIS — D61811 Other drug-induced pancytopenia: Secondary | ICD-10-CM | POA: Diagnosis not present

## 2023-12-09 DIAGNOSIS — G629 Polyneuropathy, unspecified: Secondary | ICD-10-CM | POA: Diagnosis not present

## 2023-12-09 DIAGNOSIS — T451X5D Adverse effect of antineoplastic and immunosuppressive drugs, subsequent encounter: Secondary | ICD-10-CM | POA: Diagnosis not present

## 2023-12-09 DIAGNOSIS — E7800 Pure hypercholesterolemia, unspecified: Secondary | ICD-10-CM | POA: Diagnosis not present

## 2023-12-09 DIAGNOSIS — K1232 Oral mucositis (ulcerative) due to other drugs: Secondary | ICD-10-CM | POA: Diagnosis not present

## 2023-12-09 DIAGNOSIS — F419 Anxiety disorder, unspecified: Secondary | ICD-10-CM | POA: Diagnosis not present

## 2023-12-09 DIAGNOSIS — J122 Parainfluenza virus pneumonia: Secondary | ICD-10-CM | POA: Diagnosis not present

## 2023-12-09 DIAGNOSIS — I10 Essential (primary) hypertension: Secondary | ICD-10-CM | POA: Diagnosis not present

## 2023-12-09 DIAGNOSIS — C9000 Multiple myeloma not having achieved remission: Secondary | ICD-10-CM | POA: Diagnosis not present

## 2023-12-11 ENCOUNTER — Other Ambulatory Visit: Payer: Self-pay | Admitting: Internal Medicine

## 2023-12-14 DIAGNOSIS — T451X5D Adverse effect of antineoplastic and immunosuppressive drugs, subsequent encounter: Secondary | ICD-10-CM | POA: Diagnosis not present

## 2023-12-14 DIAGNOSIS — G629 Polyneuropathy, unspecified: Secondary | ICD-10-CM | POA: Diagnosis not present

## 2023-12-14 DIAGNOSIS — E7800 Pure hypercholesterolemia, unspecified: Secondary | ICD-10-CM | POA: Diagnosis not present

## 2023-12-14 DIAGNOSIS — C9000 Multiple myeloma not having achieved remission: Secondary | ICD-10-CM | POA: Diagnosis not present

## 2023-12-14 DIAGNOSIS — K1232 Oral mucositis (ulcerative) due to other drugs: Secondary | ICD-10-CM | POA: Diagnosis not present

## 2023-12-14 DIAGNOSIS — I10 Essential (primary) hypertension: Secondary | ICD-10-CM | POA: Diagnosis not present

## 2023-12-14 DIAGNOSIS — D61811 Other drug-induced pancytopenia: Secondary | ICD-10-CM | POA: Diagnosis not present

## 2023-12-14 DIAGNOSIS — F419 Anxiety disorder, unspecified: Secondary | ICD-10-CM | POA: Diagnosis not present

## 2023-12-14 DIAGNOSIS — J122 Parainfluenza virus pneumonia: Secondary | ICD-10-CM | POA: Diagnosis not present

## 2023-12-15 ENCOUNTER — Other Ambulatory Visit: Payer: Self-pay | Admitting: *Deleted

## 2023-12-15 DIAGNOSIS — G893 Neoplasm related pain (acute) (chronic): Secondary | ICD-10-CM

## 2023-12-15 DIAGNOSIS — C9000 Multiple myeloma not having achieved remission: Secondary | ICD-10-CM

## 2023-12-15 MED ORDER — HYDROCODONE-ACETAMINOPHEN 5-325 MG PO TABS: 1.0000 | ORAL_TABLET | Freq: Four times a day (QID) | ORAL | 0 refills | Status: DC | PRN

## 2023-12-15 NOTE — Telephone Encounter (Signed)
 I reviewed the PDMP and it appears that her primary oncology team from [facility] health (Dr. 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  She was transitioned to oxycodone  during her transplant process as she could not have acetaminophen  while neutropenic.  The patient is no longer neutropenic and could resume taking Norco.  The patient will need to reach out to her primary oncologist for further prescriptions as we will only see her at [facility] transplant visits.  I called the patient to inform her of the above.  Patient expressed understanding and will reach out to Dr. 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  Patient was advised if she has difficulty obtaining prescription from her primary oncology team to reach back out to the transplant team for short-term prescription.

## 2023-12-15 NOTE — Telephone Encounter (Signed)
 Hi team,   Joanna Ray called [facility] requesting a new prescription for her oxycodone .   She is requesting this prescription be sent to her local pharmacy - [facility] at [address] [place], [place], [place] [address].    Suzann

## 2023-12-21 DIAGNOSIS — T451X5D Adverse effect of antineoplastic and immunosuppressive drugs, subsequent encounter: Secondary | ICD-10-CM | POA: Diagnosis not present

## 2023-12-21 DIAGNOSIS — F419 Anxiety disorder, unspecified: Secondary | ICD-10-CM | POA: Diagnosis not present

## 2023-12-21 DIAGNOSIS — I10 Essential (primary) hypertension: Secondary | ICD-10-CM | POA: Diagnosis not present

## 2023-12-21 DIAGNOSIS — D61811 Other drug-induced pancytopenia: Secondary | ICD-10-CM | POA: Diagnosis not present

## 2023-12-21 DIAGNOSIS — E7800 Pure hypercholesterolemia, unspecified: Secondary | ICD-10-CM | POA: Diagnosis not present

## 2023-12-21 DIAGNOSIS — G629 Polyneuropathy, unspecified: Secondary | ICD-10-CM | POA: Diagnosis not present

## 2023-12-21 DIAGNOSIS — J122 Parainfluenza virus pneumonia: Secondary | ICD-10-CM | POA: Diagnosis not present

## 2023-12-21 DIAGNOSIS — K1232 Oral mucositis (ulcerative) due to other drugs: Secondary | ICD-10-CM | POA: Diagnosis not present

## 2023-12-21 DIAGNOSIS — C9000 Multiple myeloma not having achieved remission: Secondary | ICD-10-CM | POA: Diagnosis not present

## 2023-12-28 DIAGNOSIS — F419 Anxiety disorder, unspecified: Secondary | ICD-10-CM | POA: Diagnosis not present

## 2023-12-28 DIAGNOSIS — E7800 Pure hypercholesterolemia, unspecified: Secondary | ICD-10-CM | POA: Diagnosis not present

## 2023-12-28 DIAGNOSIS — D61811 Other drug-induced pancytopenia: Secondary | ICD-10-CM | POA: Diagnosis not present

## 2023-12-28 DIAGNOSIS — I10 Essential (primary) hypertension: Secondary | ICD-10-CM | POA: Diagnosis not present

## 2023-12-28 DIAGNOSIS — K1232 Oral mucositis (ulcerative) due to other drugs: Secondary | ICD-10-CM | POA: Diagnosis not present

## 2023-12-28 DIAGNOSIS — J122 Parainfluenza virus pneumonia: Secondary | ICD-10-CM | POA: Diagnosis not present

## 2023-12-28 DIAGNOSIS — T451X5D Adverse effect of antineoplastic and immunosuppressive drugs, subsequent encounter: Secondary | ICD-10-CM | POA: Diagnosis not present

## 2023-12-28 DIAGNOSIS — C9000 Multiple myeloma not having achieved remission: Secondary | ICD-10-CM | POA: Diagnosis not present

## 2023-12-28 DIAGNOSIS — G629 Polyneuropathy, unspecified: Secondary | ICD-10-CM | POA: Diagnosis not present

## 2023-12-29 ENCOUNTER — Other Ambulatory Visit: Payer: Self-pay | Admitting: Internal Medicine

## 2024-01-03 DIAGNOSIS — D61811 Other drug-induced pancytopenia: Secondary | ICD-10-CM | POA: Diagnosis not present

## 2024-01-03 DIAGNOSIS — C9000 Multiple myeloma not having achieved remission: Secondary | ICD-10-CM | POA: Diagnosis not present

## 2024-01-03 DIAGNOSIS — G629 Polyneuropathy, unspecified: Secondary | ICD-10-CM | POA: Diagnosis not present

## 2024-01-03 DIAGNOSIS — I10 Essential (primary) hypertension: Secondary | ICD-10-CM | POA: Diagnosis not present

## 2024-01-03 DIAGNOSIS — K1232 Oral mucositis (ulcerative) due to other drugs: Secondary | ICD-10-CM | POA: Diagnosis not present

## 2024-01-03 DIAGNOSIS — F419 Anxiety disorder, unspecified: Secondary | ICD-10-CM | POA: Diagnosis not present

## 2024-01-03 DIAGNOSIS — T451X5D Adverse effect of antineoplastic and immunosuppressive drugs, subsequent encounter: Secondary | ICD-10-CM | POA: Diagnosis not present

## 2024-01-03 DIAGNOSIS — J122 Parainfluenza virus pneumonia: Secondary | ICD-10-CM | POA: Diagnosis not present

## 2024-01-03 DIAGNOSIS — E7800 Pure hypercholesterolemia, unspecified: Secondary | ICD-10-CM | POA: Diagnosis not present

## 2024-01-05 ENCOUNTER — Telehealth: Payer: Self-pay

## 2024-01-05 ENCOUNTER — Inpatient Hospital Stay: Attending: Internal Medicine

## 2024-01-05 ENCOUNTER — Telehealth: Payer: Self-pay | Admitting: Internal Medicine

## 2024-01-05 ENCOUNTER — Inpatient Hospital Stay: Admitting: Internal Medicine

## 2024-01-05 VITALS — BP 113/58 | HR 70 | Temp 98.0°F | Resp 17 | Wt 176.0 lb

## 2024-01-05 DIAGNOSIS — C9000 Multiple myeloma not having achieved remission: Secondary | ICD-10-CM | POA: Diagnosis not present

## 2024-01-05 DIAGNOSIS — Z7901 Long term (current) use of anticoagulants: Secondary | ICD-10-CM | POA: Insufficient documentation

## 2024-01-05 DIAGNOSIS — E785 Hyperlipidemia, unspecified: Secondary | ICD-10-CM | POA: Insufficient documentation

## 2024-01-05 DIAGNOSIS — Z7982 Long term (current) use of aspirin: Secondary | ICD-10-CM | POA: Insufficient documentation

## 2024-01-05 DIAGNOSIS — M129 Arthropathy, unspecified: Secondary | ICD-10-CM | POA: Diagnosis not present

## 2024-01-05 DIAGNOSIS — I10 Essential (primary) hypertension: Secondary | ICD-10-CM | POA: Diagnosis not present

## 2024-01-05 DIAGNOSIS — K219 Gastro-esophageal reflux disease without esophagitis: Secondary | ICD-10-CM | POA: Diagnosis not present

## 2024-01-05 DIAGNOSIS — Z79624 Long term (current) use of inhibitors of nucleotide synthesis: Secondary | ICD-10-CM | POA: Diagnosis not present

## 2024-01-05 DIAGNOSIS — C9001 Multiple myeloma in remission: Secondary | ICD-10-CM | POA: Diagnosis not present

## 2024-01-05 LAB — MAGNESIUM: Magnesium: 1.7 mg/dL (ref 1.7–2.4)

## 2024-01-05 LAB — CMP (CANCER CENTER ONLY)
ALT: 8 U/L (ref 0–44)
AST: 14 U/L — ABNORMAL LOW (ref 15–41)
Albumin: 4.2 g/dL (ref 3.5–5.0)
Alkaline Phosphatase: 44 U/L (ref 38–126)
Anion gap: 6 (ref 5–15)
BUN: 16 mg/dL (ref 8–23)
CO2: 28 mmol/L (ref 22–32)
Calcium: 9 mg/dL (ref 8.9–10.3)
Chloride: 107 mmol/L (ref 98–111)
Creatinine: 0.98 mg/dL (ref 0.44–1.00)
GFR, Estimated: >60 mL/min (ref >60)
Glucose, Bld: 93 mg/dL (ref 70–99)
Potassium: 4.1 mmol/L (ref 3.5–5.1)
Sodium: 141 mmol/L (ref 135–145)
Total Bilirubin: 0.7 mg/dL (ref 0.0–1.2)
Total Protein: 6.2 g/dL — ABNORMAL LOW (ref 6.5–8.1)

## 2024-01-05 LAB — CBC WITH DIFFERENTIAL (CANCER CENTER ONLY)
Abs Immature Granulocytes: 0.01 K/uL (ref 0.00–0.07)
Basophils Absolute: 0.1 K/uL (ref 0.0–0.1)
Basophils Relative: 2 %
Eosinophils Absolute: 0.1 K/uL (ref 0.0–0.5)
Eosinophils Relative: 2 %
HCT: 30.5 % — ABNORMAL LOW (ref 36.0–46.0)
Hemoglobin: 10.1 g/dL — ABNORMAL LOW (ref 12.0–15.0)
Immature Granulocytes: 0 %
Lymphocytes Relative: 38 %
Lymphs Abs: 1.6 K/uL (ref 0.7–4.0)
MCH: 31.2 pg (ref 26.0–34.0)
MCHC: 33.1 g/dL (ref 30.0–36.0)
MCV: 94.1 fL (ref 80.0–100.0)
Monocytes Absolute: 0.7 K/uL (ref 0.1–1.0)
Monocytes Relative: 16 %
Neutro Abs: 1.8 K/uL (ref 1.7–7.7)
Neutrophils Relative %: 42 %
Platelet Count: 168 K/uL (ref 150–400)
RBC: 3.24 MIL/uL — ABNORMAL LOW (ref 3.87–5.11)
RDW: 15.8 % — ABNORMAL HIGH (ref 11.5–15.5)
WBC Count: 4.3 K/uL (ref 4.0–10.5)
nRBC: 0 % (ref 0.0–0.2)

## 2024-01-05 LAB — SAMPLE TO BLOOD BANK

## 2024-01-05 NOTE — Telephone Encounter (Signed)
 Confirmed upcoming appts for labs at 1045 and visit with Dr. Sherrod @ 11:15 on 03/07/24.  Patient voiced understanding.

## 2024-01-05 NOTE — Telephone Encounter (Signed)
 Scheduled patient appointments, called and left a voicemail with appointment details.

## 2024-01-05 NOTE — Progress Notes (Signed)
 [facility] [phone]   [phone]  OFFICE PROGRESS NOTE  [name], MD [address] 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  PRIOR THERAPY:  1) Palliative radiotherapy to the plasmacytoma of the proximal right humerus under the care of Dr. Shannon. 2) Status post tumor resection from the proximal humerus with reconstruction under the care of Dr. Wash at [facility] on May 01, 2021. 3) Systemic treatment with subcutaneous daratumumab , subcutaneous Velcade  on weekly basis, Revlimid  25 mg p.o. for 14 days every 3 weeks in addition to Decadron  20 mg p.o. weekly during the course of chemotherapy.  First dose May 27, 2023.  Status post 5 cycles. 4) autologous stem cell transplant at [facility] on November 04, 2023.  CURRENT THERAPY: None.  INTERVAL HISTORY: Joanna Ray 73 y.o. female returns to the clinic today for follow-up visit accompanied by her husband.Discussed the use of AI scribe software for clinical note transcription with the patient, who gave verbal consent to proceed.  History of Present Illness Joanna Ray is a 73 year old female with multiple myeloma who presents for follow-up after autologous stem cell transplant. She is accompanied by her husband, Joanna Ray. She was referred by Dr. Fernande at [facility] for consideration of autologous stem cell transplant.  Initially diagnosed with plasmacytoma in February 2022, her condition progressed to multiple myeloma by November 2024. She underwent treatment with subcutaneous daratumumab , subcutaneous Velcade , Revlimid , and Decadron  every three weeks for five cycles prior to her stem cell transplant.  On November 04, 2023, she underwent an autologous stem cell  transplant. She is scheduled for revaccination and follow-up diagnostic studies, including a PET scan and bone marrow biopsy, in October 2025.  She experiences worsening neuropathy in her feet, which is not alleviated by her current medication regimen. She is taking gabapentin  300 mg, two pills three times a day, totaling 1800 mg daily, but it does not provide relief. No alternative medications have been tried yet.     MEDICAL HISTORY: Past Medical History:  Diagnosis Date   Anxiety    Arthritis    Cancer (HCC) 2021   multiple myloma Right Arm   Cataract    GERD (gastroesophageal reflux disease)    Hyperlipidemia    Hypertension    Urticaria     ALLERGIES:  is allergic to corn-containing products and shellfish allergy.  MEDICATIONS:  Current Outpatient Medications  Medication Sig Dispense Refill   acyclovir  (ZOVIRAX ) 400 MG tablet Take 1 tablet (400 mg total) by mouth 2 (two) times daily. 60 tablet 5   ALPRAZolam  (XANAX ) 0.5 MG tablet Take 1 tablet by mouth twice daily as needed for anxiety 60 tablet 5   amLODipine  (NORVASC ) 5 MG tablet Take 1 tablet by mouth once daily 30 tablet 6   apixaban  (ELIQUIS ) 2.5 MG TABS tablet Take 1 tablet (2.5 mg total) by mouth 2 (two) times daily. 60 tablet 2   aspirin  81 MG tablet Take 81 mg by mouth daily. (Patient not taking: Reported on 10/26/2023)     cholecalciferol  (VITAMIN D ) 1000 UNITS tablet Take 1,000 Units by mouth daily.     cyclobenzaprine  (FLEXERIL ) 10 MG tablet Take 1 tablet by mouth three times daily as needed for muscle spasm 90 tablet 0   dexamethasone  (DECADRON ) 4  MG tablet Take 5 tabs (20 mg) weekly the day after daratumumab  for 12 weeks. Take with breakfast. 20 tablet 5   EPINEPHrine  (EPIPEN  2-PAK) 0.3 mg/0.3 mL IJ SOAJ injection Inject 0.3 mg into the muscle as needed for anaphylaxis. 1 each PRN   ergocalciferol  (DRISDOL ) 1.25 MG (50000 UT) capsule Take 1 capsule (50,000 Units total) by mouth once a week. 12 capsule 3    escitalopram  (LEXAPRO ) 10 MG tablet Take 5 mg by mouth daily.     fexofenadine  (ALLEGRA ) 180 MG tablet Take 1 tablet (180 mg total) by mouth daily. 90 tablet 1   furosemide  (LASIX ) 20 MG tablet TAKE 1 TABLET BY MOUTH ONCE DAILY FOR LEG SWELLING 30 tablet 0   gabapentin  (NEURONTIN ) 300 MG capsule Take 1 capsule (300 mg total) by mouth 3 (three) times daily. 90 capsule 3   HYDROcodone -acetaminophen  (NORCO/VICODIN) 5-325 MG tablet Take 1 tablet by mouth every 6 (six) hours as needed for moderate pain (pain score 4-6). 30 tablet 0   hydrocortisone valerate cream (WESTCORT) 0.2 % 1 app     meclizine  (ANTIVERT ) 25 MG tablet Take 1 tablet (25 mg total) by mouth 3 (three) times daily as needed for dizziness. 30 tablet 0   olmesartan  (BENICAR ) 20 MG tablet Take 1 tablet (20 mg total) by mouth daily. 30 tablet 2   ondansetron  (ZOFRAN ) 8 MG tablet Take 1 tablet (8 mg total) by mouth every 8 (eight) hours as needed for nausea or vomiting. 30 tablet 1   potassium chloride  SA (KLOR-CON  M) 20 MEQ tablet Take 1 tablet (20 mEq total) by mouth 2 (two) times daily. 60 tablet 2   prochlorperazine  (COMPAZINE ) 10 MG tablet TAKE 1 TABLET BY MOUTH EVERY 6 HOURS AS NEEDED FOR NAUSEA FOR VOMITING 30 tablet 0   rosuvastatin  (CRESTOR ) 20 MG tablet TAKE 1/2 TABLET ONE TIME DAILY 45 tablet 3   vitamin E  100 UNIT capsule Take 100 Units by mouth daily. (Patient not taking: Reported on 10/26/2023)     No current facility-administered medications for this visit.    SURGICAL HISTORY:  Past Surgical History:  Procedure Laterality Date   CATARACT EXTRACTION, BILATERAL Bilateral    DILATION AND CURETTAGE OF UTERUS      REVIEW OF SYSTEMS:  Constitutional: positive for fatigue Eyes: negative Ears, nose, mouth, throat, and face: negative Respiratory: negative Cardiovascular: negative Gastrointestinal: negative Genitourinary:negative Integument/breast: negative Hematologic/lymphatic:  negative Musculoskeletal:negative Neurological: positive for paresthesia Behavioral/Psych: negative Endocrine: negative Allergic/Immunologic: negative   PHYSICAL EXAMINATION: General appearance: alert, cooperative, and no distress Head: Normocephalic, without obvious abnormality, atraumatic Neck: no adenopathy, no JVD, supple, symmetrical, trachea midline, and thyroid  not enlarged, symmetric, no tenderness/mass/nodules Lymph nodes: Cervical, supraclavicular, and axillary nodes normal. Resp: clear to auscultation bilaterally Back: symmetric, no curvature. ROM normal. No CVA tenderness. Cardio: regular rate and rhythm, S1, S2 normal, no murmur, click, rub or gallop GI: soft, non-tender; bowel sounds normal; no masses,  no organomegaly Extremities: edema trace edema bilateral Neurologic: Alert and oriented X 3, normal strength and tone. Normal symmetric reflexes. Normal coordination and gait  ECOG PERFORMANCE STATUS: 1 - Symptomatic but completely ambulatory  Blood pressure (!) 113/58, pulse 70, temperature 98 F (36.7 C), resp. rate 17, weight 176 lb (79.8 kg), SpO2 100%.  LABORATORY DATA: Lab Results  Component Value Date   WBC 4.3 01/05/2024   HGB 10.1 (L) 01/05/2024   HCT 30.5 (L) 01/05/2024   MCV 94.1 01/05/2024   PLT 168 01/05/2024      Chemistry  Component Value Date/Time   NA 145 10/18/2023 0805   NA 145 (H) 12/28/2018 1436   K 2.8 (L) 10/18/2023 0805   CL 106 10/18/2023 0805   CO2 30 10/18/2023 0805   BUN 16 10/18/2023 0805   BUN 15 12/28/2018 1436   CREATININE 0.77 10/18/2023 0805   CREATININE 1.42 (H) 07/22/2023 0956      Component Value Date/Time   CALCIUM  9.0 10/18/2023 0805   ALKPHOS 46 10/18/2023 0805   AST 13 (L) 10/18/2023 0805   ALT 7 10/18/2023 0805   BILITOT 1.1 10/18/2023 0805       RADIOGRAPHIC STUDIES: No results found.   ASSESSMENT AND PLAN: This is a very pleasant 74 years old African-American female with plasmacytoma of the  proximal right humerus diagnosed in February 2022.  The patient had extensive work-up for multiple myeloma including a bone marrow biopsy and aspirate that showed only 6% plasma cells.  Her protein studies still suspicious for underlying MGUS/multiple myeloma but the findings are not enough to call it multiple myeloma at this point. The patient underwent radiotherapy to the plasmacytoma of the proximal right humerus and tolerated the procedure fairly well.  She is currently on observation.  The skeletal bone survey showed no other lytic bone lesion except the proximal right humerus. The patient was treated with palliative radiotherapy in the past but recent skeletal bone survey showed progression of the lytic lesion in the proximal humeral diaphysis with pathologic fracture.  She underwent surgical resection of the tumor with reconstruction under the care of Dr. Wash at [facility] on May 01, 2021.   The patient was found on recent protein study to have elevated IgG level.  She underwent a bone marrow biopsy and aspirate that showed 10-20% plasma cells. She had skeletal bone survey performed recently that showed new lytic lesions in the proximal left femoral shaft and midshaft of the right femur consistent with myelomatous involvement or other lytic metastasis.  There was also scattered areas of endosteal scalloping in the left humeral shaft concerning for myelomatous involvement. The patient started systemic treatment with subcutaneous daratumumab , subcutaneous Velcade , Revlimid  and Decadron  every 3 weeks cycle on May 27, 2023.  She status post 5 cycles and tolerated the first cycle of her treatment fairly well. She will start her treatment with Zometa  for the bone disease after we received the clearance from her dentist. The patient underwent autologous stem cell transplant at [facility] on November 04, 2023 and she is doing fine. Assessment and  Plan Assessment & Plan Multiple myeloma post-autologous stem cell transplant Multiple myeloma initially diagnosed as plasmacytoma in February 2022, progressed to multiple myeloma in November 2024. Status post-autologous stem cell transplant in June 2025. Currently recovering well from the transplant with reasonably stable blood counts. - Schedule revaccination in October 2025. - Perform PET scan and bone marrow biopsy on February 15, 2024. - Initiate maintenance therapy with daratumumab  and Revlimid  3-6 months post-transplant, pending full recovery and release from the transplant team.  Chemotherapy-induced peripheral neuropathy Worsening peripheral neuropathy in the feet, unresponsive to current gabapentin  regimen (1800 mg/day). - Consider referral to a neurologist for further evaluation and management. - Discuss potential alternative medications such as Cymbalta. She was advised to call immediately if she has any other concerning symptoms in the interval. The patient voices understanding of current disease status and treatment options and is in agreement with the current care plan.  All questions were answered. The patient knows  to call the clinic with any problems, questions or concerns. We can certainly see the patient much sooner if necessary.  The total time spent in the appointment was 30 minutes.  Disclaimer: This note was dictated with voice recognition software. Similar sounding words can inadvertently be transcribed and may not be corrected upon review.

## 2024-01-06 ENCOUNTER — Other Ambulatory Visit: Payer: Self-pay

## 2024-01-10 DIAGNOSIS — J122 Parainfluenza virus pneumonia: Secondary | ICD-10-CM | POA: Diagnosis not present

## 2024-01-10 DIAGNOSIS — C9000 Multiple myeloma not having achieved remission: Secondary | ICD-10-CM | POA: Diagnosis not present

## 2024-01-10 DIAGNOSIS — K1232 Oral mucositis (ulcerative) due to other drugs: Secondary | ICD-10-CM | POA: Diagnosis not present

## 2024-01-10 DIAGNOSIS — G629 Polyneuropathy, unspecified: Secondary | ICD-10-CM | POA: Diagnosis not present

## 2024-01-10 DIAGNOSIS — I10 Essential (primary) hypertension: Secondary | ICD-10-CM | POA: Diagnosis not present

## 2024-01-10 DIAGNOSIS — D61811 Other drug-induced pancytopenia: Secondary | ICD-10-CM | POA: Diagnosis not present

## 2024-01-10 DIAGNOSIS — F419 Anxiety disorder, unspecified: Secondary | ICD-10-CM | POA: Diagnosis not present

## 2024-01-10 DIAGNOSIS — E7800 Pure hypercholesterolemia, unspecified: Secondary | ICD-10-CM | POA: Diagnosis not present

## 2024-01-10 DIAGNOSIS — T451X5D Adverse effect of antineoplastic and immunosuppressive drugs, subsequent encounter: Secondary | ICD-10-CM | POA: Diagnosis not present

## 2024-01-11 ENCOUNTER — Telehealth: Payer: Self-pay

## 2024-01-11 NOTE — Telephone Encounter (Signed)
 Patient called in and reports that Dr. Fernande has started prescribing Acyclovir  400 mg, 2 tablets, 2 times daily equaling 1600 mg total daily.  Informed patient that I would relay information to Dr. Sherrod. She voiced understanding.

## 2024-01-14 ENCOUNTER — Telehealth: Payer: Self-pay

## 2024-01-14 ENCOUNTER — Other Ambulatory Visit: Payer: Self-pay

## 2024-01-14 DIAGNOSIS — G893 Neoplasm related pain (acute) (chronic): Secondary | ICD-10-CM

## 2024-01-14 DIAGNOSIS — C9000 Multiple myeloma not having achieved remission: Secondary | ICD-10-CM

## 2024-01-14 MED ORDER — HYDROCODONE-ACETAMINOPHEN 5-325 MG PO TABS: 1.0000 | ORAL_TABLET | Freq: Four times a day (QID) | ORAL | 0 refills | Status: DC | PRN

## 2024-01-14 NOTE — Telephone Encounter (Signed)
 Received phone call from patient requesting a refill on Norco before the weekend. Informed patient that Dr. Sherrod is not in the office today and that the message will be sent to another physician for review. Patient voiced understanding.

## 2024-01-14 NOTE — Progress Notes (Signed)
 Norco refilled to [facility] [address] per patient request.

## 2024-01-17 ENCOUNTER — Encounter: Payer: Self-pay | Admitting: Internal Medicine

## 2024-01-18 ENCOUNTER — Other Ambulatory Visit

## 2024-01-18 ENCOUNTER — Other Ambulatory Visit: Payer: Self-pay | Admitting: Internal Medicine

## 2024-01-18 DIAGNOSIS — C9000 Multiple myeloma not having achieved remission: Secondary | ICD-10-CM | POA: Diagnosis not present

## 2024-01-18 DIAGNOSIS — Z0000 Encounter for general adult medical examination without abnormal findings: Secondary | ICD-10-CM

## 2024-01-18 DIAGNOSIS — D649 Anemia, unspecified: Secondary | ICD-10-CM

## 2024-01-18 DIAGNOSIS — I10 Essential (primary) hypertension: Secondary | ICD-10-CM | POA: Diagnosis not present

## 2024-01-18 DIAGNOSIS — E7800 Pure hypercholesterolemia, unspecified: Secondary | ICD-10-CM

## 2024-01-18 DIAGNOSIS — Z1329 Encounter for screening for other suspected endocrine disorder: Secondary | ICD-10-CM | POA: Diagnosis not present

## 2024-01-18 NOTE — Progress Notes (Addendum)
 Annual Wellness Visit   Patient Care Team: [name], MD as PCP - General (Internal Medicine)  Visit Date: 01/20/24   Chief Complaint  Patient presents with   Annual Exam   Medicare Wellness   Subjective:  Patient: Joanna Ray, Female DOB: April 24, 1951, 73 y.o. MRN: [id number] Vitals:   01/20/24 1052  BP: 128/70   Joanna Ray is a 73 y.o. Female who presents today for her Annual Wellness Visit.    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 She had a plasmacytoma resection from the proximal humerus with reconstruction by Dr. Wash on 05/01/21. She then underwent  radiotherapy with Dr. Shannon.  Since developing multiple myeloma in November, she has received chemotherapy with the first dose given May 27, 2023. On 11/04/2023 she underwent a autologous stem cell transplant. Continues to see her Oncologist, Dr. Sherrod here in [place] and is also being followed and treated at [facility]. Has completed 5 cycles of chemotherapy with daratumumab , Velcade  weekly Revlimid   orally 14 days every 3 weeks in addition to Decadron  2o mg weekly.  History of Anxiety treated with Alprazolam  0.5 mg twice a day.   History of Hyperlipidemia treated with Rosuvastatin  10 mg daily. 01/18/2024 Lipid panel Normal.    History of Hypertension treated with Valsartan -hydrochlorothiazide  160-25 daily. Blood pressure today is normal at 120/70.  History of lower extremity edema treated by Furosemide  20 mg. daily  History of Hypokalemia treated with Klor-con  20 meq daily.  History of Neuropathy treated by Gabapentin  300 mg. taking two tabs three times a day.  Has not been effective in relieving symptoms thus far.   Labs 01/18/2024  RBC 3.33, Hemoglobin 10.4, HCT 32.3, Total Protein 6.0, Globulin 1.5, AG Ratio 3.0, Otherwise WNL   03/31/2024  Mammogram No mammographic evidence of malignancy. Repeat in one year.     05/06/2022 Colonoscopy Moderate diverticulosis in the sigmoid colon, in the descending colon, in the transverse colon, in the ascending colon and in the cecum. Non- bleeding external and internal hemorrhoids. No specimens collected. No repeat due to age.    Social Hx: Husband is retired and has CHF. Pt has not smoked in over 20 years. 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 Does not consume alcohol.  FHx: Father died at age 5 of a stroke.  Mother died age 63 with history of stroke in 2010.  1 brother with history of hypertension and MI.  1 sister with history of diabetes.  1 sister with history of hypertension and another sister with history of hypertension.  Son with history of hypertension.  She lost her son in the summer 2019 and an automobile accident.  Vaccine counseling: Consult with oncologist about vaccinations is she able to receive.    Health Maintenance  Topic Date Due   Influenza Vaccine  04/27/2024 (Originally 11/26/2023)   COVID-19 Vaccine (4 - 2025-26 season) 04/27/2024 (Originally 12/27/2023)   Zoster Vaccines- Shingrix (1 of 2) 07/26/2024 (Originally [birth year]/13/1970)   DEXA SCAN  10/17/2024 (Originally 02/14/2016)   Medicare Annual Wellness (AWV)  01/19/2025   DTaP/Tdap/Td (2 - Td or Tdap) 06/20/2025   Colonoscopy  05/06/2032   Pneumococcal Vaccine: 50+ Years  Completed   HPV VACCINES  Aged Out   Meningococcal B Vaccine  Aged Out   Mammogram  Discontinued   Hepatitis C Screening  Discontinued  Review of Systems  Constitutional:  Negative for fever and malaise/fatigue.  HENT:  Negative for congestion.   Eyes:  Negative for blurred vision.  Respiratory:  Negative for cough and shortness of breath.   Cardiovascular:  Negative for chest pain, palpitations and leg swelling.  Gastrointestinal:  Negative for vomiting.  Musculoskeletal:  Negative for back pain.  Skin:  Negative for  rash.  Neurological:  Negative for loss of consciousness and headaches.   Objective:  Vitals: body mass index is 29.12 kg/m. Today's Vitals   01/20/24 1052  BP: 128/70  Pulse: 74  SpO2: 98%  Weight: 175 lb (79.4 kg)  Height: 5' 5 (1.651 m)  PainSc: 5   PainLoc: Leg   Physical Exam Vitals and nursing note reviewed.  Constitutional:      General: She is not in acute distress.    Appearance: Normal appearance. She is not ill-appearing or toxic-appearing.  HENT:     Head: Normocephalic and atraumatic.     Right Ear: Hearing, tympanic membrane, ear canal and external ear normal.     Left Ear: Hearing, tympanic membrane, ear canal and external ear normal.     Mouth/Throat:     Pharynx: Oropharynx is clear.  Eyes:     Extraocular Movements: Extraocular movements intact.     Pupils: Pupils are equal, round, and reactive to light.  Neck:     Thyroid : No thyroid  mass, thyromegaly or thyroid  tenderness.     Vascular: No carotid bruit.  Cardiovascular:     Rate and Rhythm: Normal rate and regular rhythm. No extrasystoles are present.    Pulses:          Dorsalis pedis pulses are 2+ on the right side and 2+ on the left side.     Heart sounds: Normal heart sounds. No murmur heard.    No friction rub. No gallop.  Pulmonary:     Effort: Pulmonary effort is normal.     Breath sounds: Normal breath sounds. No decreased breath sounds, wheezing, rhonchi or rales.  Chest:     Chest wall: No mass.     Comments: Thickening at 6'oclock Abdominal:     Palpations: Abdomen is soft. There is no hepatomegaly, splenomegaly or mass.     Tenderness: There is no abdominal tenderness.     Hernia: No hernia is present.  Musculoskeletal:     Cervical back: Normal range of motion and neck supple.     Right lower leg: Edema present.     Left lower leg: Edema present.     Comments: Trace lower extremity edema  Lymphadenopathy:     Cervical: No cervical adenopathy.     Upper Body:     Right upper  body: No supraclavicular adenopathy.     Left upper body: No supraclavicular adenopathy.  Skin:    General: Skin is warm and dry.  Neurological:     General: No focal deficit present.     Mental Status: She is alert and oriented to person, place, and time. Mental status is at baseline.     Sensory: Sensation is intact.     Motor: Motor function is intact. No weakness.     Deep Tendon Reflexes: Reflexes are normal and symmetric.  Psychiatric:        Attention and Perception: Attention normal.        Mood and Affect: Mood normal.        Speech: Speech normal.        Behavior:  Behavior normal.        Thought Content: Thought content normal.        Cognition and Memory: Cognition normal.        Judgment: Judgment normal.     Current Outpatient Medications  Medication Instructions   acyclovir  (ZOVIRAX ) 400 mg, Oral, 2 times daily   ALPRAZolam  (XANAX ) 0.5 mg, Oral, 2 times daily PRN, for anxiety   amLODipine  (NORVASC ) 5 mg, Oral, Daily   apixaban  (ELIQUIS ) 2.5 mg, Oral, 2 times daily   aspirin  81 mg, Daily   cholecalciferol  (VITAMIN D ) 1,000 Units, Daily   cyclobenzaprine  (FLEXERIL ) 10 mg, Oral, 3 times daily PRN, for muscle spams   EPINEPHrine  (EPIPEN  2-PAK) 0.3 mg, Intramuscular, As needed   ergocalciferol  (DRISDOL ) 50,000 Units, Oral, Weekly   fexofenadine  (ALLEGRA ) 180 mg, Oral, Daily   furosemide  (LASIX ) 20 MG tablet TAKE 1 TABLET BY MOUTH ONCE DAILY FOR LEG SWELLING   gabapentin  (NEURONTIN ) 300 mg, Oral, 3 times daily   HYDROcodone -acetaminophen  (NORCO/VICODIN) 5-325 MG tablet 1 tablet, Oral, Every 6 hours PRN   hydrocortisone valerate cream (WESTCORT) 0.2 % 1 app   olmesartan  (BENICAR ) 20 mg, Oral, Daily   ondansetron  (ZOFRAN ) 8 mg, Oral, Every 8 hours PRN   potassium chloride  SA (KLOR-CON  M) 20 MEQ tablet 20 mEq, Oral, 2 times daily   prochlorperazine  (COMPAZINE ) 10 MG tablet TAKE 1 TABLET BY MOUTH EVERY 6 HOURS AS NEEDED FOR NAUSEA FOR VOMITING   rosuvastatin  (CRESTOR ) 20  MG tablet TAKE 1/2 TABLET ONE TIME DAILY   vitamin E  100 Units, Daily   Past Medical History:  Diagnosis Date   Anxiety    Arthritis    Cancer (HCC) 2021   multiple myloma Right Arm   Cataract    GERD (gastroesophageal reflux disease)    Hyperlipidemia    Hypertension    Urticaria    Medical/Surgical History Narrative:  Allergic/Intolerant to:  Allergies  Allergen Reactions   Corn-Containing Products Swelling    Rash    Shellfish Allergy Swelling    Swelling of lips    Past Surgical History:  Procedure Laterality Date   CATARACT EXTRACTION, BILATERAL Bilateral    DILATION AND CURETTAGE OF UTERUS     Family History  Problem Relation Age of Onset   Stroke Mother    Stroke Father    Diabetes Sister    Breast cancer Sister 59   Ovarian cancer Maternal Grandmother 51   Colon cancer Neg Hx    Colon polyps Neg Hx    Esophageal cancer Neg Hx    Rectal cancer Neg Hx    Stomach cancer Neg Hx    Family History Narrative:  Father died at age 50 of a stroke.  Mother died at age 76 with history of stroke in 2010.  1 brother with history of hypertension and MI.  1 sister with history of diabetes.  1 sister with history of hypertension and another sister with history of hypertension.  Son with history of hypertension.  She lost her son in the summer 2019 in an automobile accident.   Social History Narrative:   Husband is retired and has congestive heart failure.  Patient used to smoke but has not smoked in over 20 years.  She has adult children and has operates an adult daycare center.  No alcohol consumption.     Most Recent Health Risks Assessment:   Medicare Risk at Home - 01/20/24 1058     Any stairs in or around the home?  No    If so, are there any without handrails? No    Home free of loose throw rugs in walkways, pet beds, electrical cords, etc? Yes    Adequate lighting in your home to reduce risk of falls? Yes    Life alert? No    Use of a cane, walker or w/c? Yes     Grab bars in the bathroom? No    Shower chair or bench in shower? No    Elevated toilet seat or a handicapped toilet? No         Most Recent Social Determinants of Health (Including Hx of Tobacco, Alcohol, and Drug Use) SDOH Screenings   Food Insecurity: No Food Insecurity (01/20/2024)  Housing: Low Risk  (01/20/2024)  Transportation Needs: No Transportation Needs (11/12/2023)   Received from [facility]  Utilities: Not At Risk (01/20/2024)  Alcohol Screen: Low Risk  (01/20/2024)  Depression (PHQ2-9): Low Risk  (01/05/2024)  Financial Resource Strain: Low Risk  (01/19/2023)  Physical Activity: Sufficiently Active (01/20/2024)  Social Connections: Socially Integrated (01/19/2023)  Stress: No Stress Concern Present (01/19/2023)  Tobacco Use: Medium Risk (01/20/2024)  Health Literacy: Adequate Health Literacy (01/20/2024)   Social History   Tobacco Use   Smoking status: Former    Current packs/day: 0.00    Average packs/day: 0.3 packs/day for 20.0 years (5.0 ttl pk-yrs)    Types: Cigarettes    Start date: 77    Quit date: 57    Years since quitting: 45.7   Smokeless tobacco: Never  Vaping Use   Vaping status: Never Used  Substance Use Topics   Alcohol use: No   Drug use: No   Most Recent Functional Status Assessment:    01/20/2024   10:57 AM  In your present state of health, do you have any difficulty performing the following activities:  Hearing? 0  Vision? 0  Difficulty concentrating or making decisions? 0  Walking or climbing stairs? 0  Dressing or bathing? 0  Doing errands, shopping? 0  Preparing Food and eating ? N  Using the Toilet? N  In the past six months, have you accidently leaked urine? N  Do you have problems with loss of bowel control? N  Managing your Medications? N  Managing your Finances? N  Housekeeping or managing your Housekeeping? N   Most Recent Fall Risk Assessment:    01/20/2024   10:59 AM  Fall Risk   Falls in the past year? 0  Number  falls in past yr: 0  Injury with Fall? 0  Risk for fall due to : No Fall Risks  Follow up Falls prevention discussed;Education provided;Falls evaluation completed   Most Recent Anxiety/Depression Screenings:    01/05/2024    1:34 PM 07/23/2023   10:42 AM  PHQ 2/9 Scores  PHQ - 2 Score 0 0  PHQ- 9 Score  0       No data to display         Most Recent Cognitive Screening:    01/20/2024   10:59 AM  6CIT Screen  What Year? 0 points  What month? 0 points  What time? 0 points  Count back from 20 0 points  Months in reverse 0 points  Repeat phrase 0 points  Total Score 0 points   Most Recent Vision/Hearing Screenings:No results found. Results:  Studies Obtained And Personally Reviewed By Me:   03/31/2024 Mammogram No mammographic evidence of malignancy. Repeat in one year.     05/06/2022 Colonoscopy  Moderate diverticulosis in the sigmoid colon, in the descending colon, in the transverse colon, in the ascending colon and in the cecum. Non- bleeding external and internal hemorrhoids. No specimens collected. No repeat due to age.    Labs:  CBC w/ Differential Lab Results  Component Value Date   WBC 3.9 01/18/2024   RBC 3.33 (L) 01/18/2024   HGB 10.4 (L) 01/18/2024   HCT 32.3 (L) 01/18/2024   PLT 217 01/18/2024   MCV 97.0 01/18/2024   MCH 31.2 01/18/2024   MCHC 32.2 01/18/2024   RDW 14.7 01/18/2024   MPV 10.3 01/18/2024   LYMPHSABS 1.6 01/05/2024   MONOABS 0.7 01/05/2024   BASOSABS 39 01/18/2024    Comprehensive Metabolic Panel Lab Results  Component Value Date   NA 145 01/18/2024   K 4.7 01/18/2024   CL 108 01/18/2024   CO2 30 01/18/2024   GLUCOSE 94 01/18/2024   BUN 16 01/18/2024   CREATININE 0.92 01/18/2024   CALCIUM  9.6 01/18/2024   PROT 6.0 (L) 01/18/2024   ALBUMIN 4.2 01/05/2024   AST 15 01/18/2024   ALT 8 01/18/2024   ALKPHOS 44 01/05/2024   BILITOT 0.5 01/18/2024   EGFR 66 01/18/2024   GFRNONAA >60 01/05/2024   Lipid Panel  Lab Results   Component Value Date   CHOL 174 01/18/2024   HDL 67 01/18/2024   LDLCALC 91 01/18/2024   TRIG 70 01/18/2024   A1c Lab Results  Component Value Date   HGBA1C 5.7 (H) 09/03/2020    TSH Lab Results  Component Value Date   TSH 1.67 01/18/2024    Assessment & Plan:   Orders Placed This Encounter  Procedures   MM 3D DIAGNOSTIC MAMMOGRAM BILATERAL BREAST    Reason for Exam (SYMPTOM  OR DIAGNOSIS REQUIRED):   has quarter sized thickening in left breast at 6 o'clock    Preferred imaging location?:   [facility]   POCT URINALYSIS DIP (CLINITEK)   Multiple myeloma IgG subtype initially diagnosed as plasmacytoma of the proximal right humerus diagnosed in February 2022 with full-blown multiple myeloma in November 2024. She had a plasmacytoma resection from the proximal humerus with reconstruction by Dr. Wash on 05/01/21. She has completed radiotherapy. Treated with chemotherapy with the first dose given May 27, 2023. On 11/04/2023 she has a autologous stem cell transplant at [facility] where she is followed closely. Continues to see her Oncologist here in [place], Dr. Sherrod.    Anxiety: treated with Alprazolam  0.5 mg twice a day.   Hyperlipidemia: treated with Rosuvastatin  10 mg daily. 01/18/2024 Lipid panel Normal.    Hypertension: treated with Olmesartan  20 mg. Blood pressure today is normal at 120/70.  Lower Extremity Edema: treated by Furosemide  20 mg.    Hypokalemia: treated with Klor-con  20 meq.  Neuropathy: treated by Gabapentin  300 mg, two pills three times a day.     03/31/2024 Mammogram No mammographic evidence of malignancy. Repeat in one year.      Mammogram ordered due to mass of left breast.   05/06/2022 Colonoscopy Moderate diverticulosis in the sigmoid colon, in the descending colon, in the transverse colon, in the ascending colon and in the cecum. Non- bleeding external and internal hemorrhoids. No specimens collected. No repeat due to age.    Vaccine  counseling: Consult with oncologist about vaccinations is she able to do.       Annual Wellness Visit done today including the all of the following: Reviewed patient's Family Medical History Reviewed patient's SDOH and reviewed  tobacco, alcohol, and drug use.  Reviewed and updated list of patient's medical providers Assessment of cognitive impairment was done Assessed patient's functional ability Established a written schedule for health screening services Health Risk Assessent Completed and Reviewed  Discussed health benefits of physical activity, and encouraged her to engage in regular exercise appropriate for her age and condition.    I,Makayla C Reid,acting as a scribe for Ronal JINNY Hailstone, MD.,have documented all relevant documentation on the behalf of Ronal JINNY Hailstone, MD,as directed by  Ronal JINNY Hailstone, MD while in the presence of Ronal JINNY Hailstone, MD.  I, Ronal JINNY Hailstone, MD, have reviewed all documentation for this visit. The documentation on 01/20/2024 for the exam, diagnosis, procedures, and orders are all accurate and complete.   I have reviewed and agree with the above Annual Wellness Visit documentation.  Ronal Norleen Hailstone, MD. Internal Medicine 01/20/2024 01/20/2024

## 2024-01-19 ENCOUNTER — Encounter: Payer: Self-pay | Admitting: Internal Medicine

## 2024-01-19 LAB — CBC WITH DIFFERENTIAL/PLATELET
Absolute Lymphocytes: 1271 {cells}/uL (ref 850–3900)
Absolute Monocytes: 550 {cells}/uL (ref 200–950)
Basophils Absolute: 39 {cells}/uL (ref 0–200)
Basophils Relative: 1 %
Eosinophils Absolute: 82 {cells}/uL (ref 15–500)
Eosinophils Relative: 2.1 %
HCT: 32.3 % — ABNORMAL LOW (ref 35.0–45.0)
Hemoglobin: 10.4 g/dL — ABNORMAL LOW (ref 11.7–15.5)
MCH: 31.2 pg (ref 27.0–33.0)
MCHC: 32.2 g/dL (ref 32.0–36.0)
MCV: 97 fL (ref 80.0–100.0)
MPV: 10.3 fL (ref 7.5–12.5)
Monocytes Relative: 14.1 %
Neutro Abs: 1958 {cells}/uL (ref 1500–7800)
Neutrophils Relative %: 50.2 %
Platelets: 217 Thousand/uL (ref 140–400)
RBC: 3.33 Million/uL — ABNORMAL LOW (ref 3.80–5.10)
RDW: 14.7 % (ref 11.0–15.0)
Total Lymphocyte: 32.6 %
WBC: 3.9 Thousand/uL (ref 3.8–10.8)

## 2024-01-19 LAB — LIPID PANEL
Cholesterol: 174 mg/dL (ref <200)
HDL: 67 mg/dL (ref >=50)
LDL Cholesterol (Calc): 91 mg/dL
Non-HDL Cholesterol (Calc): 107 mg/dL (ref <130)
Total CHOL/HDL Ratio: 2.6 (calc) (ref <5.0)
Triglycerides: 70 mg/dL (ref <150)

## 2024-01-19 LAB — COMPREHENSIVE METABOLIC PANEL WITH GFR
AG Ratio: 3 (calc) — ABNORMAL HIGH (ref 1.0–2.5)
ALT: 8 U/L (ref 6–29)
AST: 15 U/L (ref 10–35)
Albumin: 4.5 g/dL (ref 3.6–5.1)
Alkaline phosphatase (APISO): 44 U/L (ref 37–153)
BUN: 16 mg/dL (ref 7–25)
CO2: 30 mmol/L (ref 20–32)
Calcium: 9.6 mg/dL (ref 8.6–10.4)
Chloride: 108 mmol/L (ref 98–110)
Creat: 0.92 mg/dL (ref 0.60–1.00)
Globulin: 1.5 g/dL — ABNORMAL LOW (ref 1.9–3.7)
Glucose, Bld: 94 mg/dL (ref 65–99)
Potassium: 4.7 mmol/L (ref 3.5–5.3)
Sodium: 145 mmol/L (ref 135–146)
Total Bilirubin: 0.5 mg/dL (ref 0.2–1.2)
Total Protein: 6 g/dL — ABNORMAL LOW (ref 6.1–8.1)
eGFR: 66 mL/min/1.73m2 (ref >=60)

## 2024-01-19 LAB — TSH: TSH: 1.67 m[IU]/L (ref 0.40–4.50)

## 2024-01-20 ENCOUNTER — Ambulatory Visit: Admitting: Internal Medicine

## 2024-01-20 ENCOUNTER — Encounter: Payer: Self-pay | Admitting: Internal Medicine

## 2024-01-20 ENCOUNTER — Other Ambulatory Visit: Payer: Self-pay | Admitting: Internal Medicine

## 2024-01-20 VITALS — BP 128/70 | HR 74 | Ht 65.0 in | Wt 175.0 lb

## 2024-01-20 DIAGNOSIS — E7800 Pure hypercholesterolemia, unspecified: Secondary | ICD-10-CM

## 2024-01-20 DIAGNOSIS — Z9484 Stem cells transplant status: Secondary | ICD-10-CM

## 2024-01-20 DIAGNOSIS — C9031 Solitary plasmacytoma in remission: Secondary | ICD-10-CM

## 2024-01-20 DIAGNOSIS — G609 Hereditary and idiopathic neuropathy, unspecified: Secondary | ICD-10-CM

## 2024-01-20 DIAGNOSIS — Z96611 Presence of right artificial shoulder joint: Secondary | ICD-10-CM

## 2024-01-20 DIAGNOSIS — C9000 Multiple myeloma not having achieved remission: Secondary | ICD-10-CM

## 2024-01-20 DIAGNOSIS — N6320 Unspecified lump in the left breast, unspecified quadrant: Secondary | ICD-10-CM | POA: Diagnosis not present

## 2024-01-20 DIAGNOSIS — Z91013 Allergy to seafood: Secondary | ICD-10-CM | POA: Diagnosis not present

## 2024-01-20 DIAGNOSIS — E876 Hypokalemia: Secondary | ICD-10-CM

## 2024-01-20 DIAGNOSIS — I10 Essential (primary) hypertension: Secondary | ICD-10-CM

## 2024-01-20 DIAGNOSIS — Z0000 Encounter for general adult medical examination without abnormal findings: Secondary | ICD-10-CM

## 2024-01-20 DIAGNOSIS — Z8579 Personal history of other malignant neoplasms of lymphoid, hematopoietic and related tissues: Secondary | ICD-10-CM

## 2024-01-20 DIAGNOSIS — Z6829 Body mass index (BMI) 29.0-29.9, adult: Secondary | ICD-10-CM

## 2024-01-20 DIAGNOSIS — Z8659 Personal history of other mental and behavioral disorders: Secondary | ICD-10-CM

## 2024-01-20 LAB — POCT URINALYSIS DIP (CLINITEK)
Bilirubin, UA: NEGATIVE
Blood, UA: NEGATIVE
Glucose, UA: NEGATIVE mg/dL
Ketones, POC UA: NEGATIVE mg/dL
Leukocytes, UA: NEGATIVE
Nitrite, UA: NEGATIVE
POC PROTEIN,UA: NEGATIVE
Spec Grav, UA: 1.015 (ref 1.010–1.025)
Urobilinogen, UA: 0.2 U/dL
pH, UA: 6.5 (ref 5.0–8.0)

## 2024-01-21 DIAGNOSIS — G629 Polyneuropathy, unspecified: Secondary | ICD-10-CM | POA: Diagnosis not present

## 2024-01-21 DIAGNOSIS — J122 Parainfluenza virus pneumonia: Secondary | ICD-10-CM | POA: Diagnosis not present

## 2024-01-21 DIAGNOSIS — F419 Anxiety disorder, unspecified: Secondary | ICD-10-CM | POA: Diagnosis not present

## 2024-01-21 DIAGNOSIS — E7800 Pure hypercholesterolemia, unspecified: Secondary | ICD-10-CM | POA: Diagnosis not present

## 2024-01-21 DIAGNOSIS — T451X5D Adverse effect of antineoplastic and immunosuppressive drugs, subsequent encounter: Secondary | ICD-10-CM | POA: Diagnosis not present

## 2024-01-21 DIAGNOSIS — K1232 Oral mucositis (ulcerative) due to other drugs: Secondary | ICD-10-CM | POA: Diagnosis not present

## 2024-01-21 DIAGNOSIS — I10 Essential (primary) hypertension: Secondary | ICD-10-CM | POA: Diagnosis not present

## 2024-01-21 DIAGNOSIS — C9000 Multiple myeloma not having achieved remission: Secondary | ICD-10-CM | POA: Diagnosis not present

## 2024-01-21 DIAGNOSIS — D61811 Other drug-induced pancytopenia: Secondary | ICD-10-CM | POA: Diagnosis not present

## 2024-01-23 ENCOUNTER — Encounter: Payer: Self-pay | Admitting: Internal Medicine

## 2024-01-23 NOTE — Patient Instructions (Addendum)
 We are glad you are doing well. Let's plan to follow up in one year or as needed. Continue follow up at [facility] and Dr. Sherrod here in [place].

## 2024-01-23 NOTE — Addendum Note (Signed)
 Addended by: [name] on: 01/23/2024 05:19 PM   Modules accepted: Level of Service

## 2024-02-02 ENCOUNTER — Encounter: Payer: Self-pay | Admitting: Internal Medicine

## 2024-02-02 ENCOUNTER — Other Ambulatory Visit: Payer: Self-pay | Admitting: Internal Medicine

## 2024-02-02 ENCOUNTER — Ambulatory Visit
Admission: RE | Admit: 2024-02-02 | Discharge: 2024-02-02 | Disposition: A | Discharge status: Home / Self Care | Source: Ambulatory Visit | Attending: Internal Medicine | Admitting: Internal Medicine

## 2024-02-02 ENCOUNTER — Ambulatory Visit
Admission: RE | Admit: 2024-02-02 | Discharge: 2024-02-02 | Disposition: A | Source: Ambulatory Visit | Attending: Internal Medicine | Admitting: Internal Medicine

## 2024-02-02 DIAGNOSIS — R600 Localized edema: Secondary | ICD-10-CM

## 2024-02-02 DIAGNOSIS — N6320 Unspecified lump in the left breast, unspecified quadrant: Secondary | ICD-10-CM | POA: Diagnosis not present

## 2024-02-02 DIAGNOSIS — R928 Other abnormal and inconclusive findings on diagnostic imaging of breast: Secondary | ICD-10-CM | POA: Diagnosis not present

## 2024-02-03 DIAGNOSIS — E7800 Pure hypercholesterolemia, unspecified: Secondary | ICD-10-CM | POA: Diagnosis not present

## 2024-02-03 DIAGNOSIS — C9000 Multiple myeloma not having achieved remission: Secondary | ICD-10-CM | POA: Diagnosis not present

## 2024-02-03 DIAGNOSIS — T451X5D Adverse effect of antineoplastic and immunosuppressive drugs, subsequent encounter: Secondary | ICD-10-CM | POA: Diagnosis not present

## 2024-02-03 DIAGNOSIS — K1232 Oral mucositis (ulcerative) due to other drugs: Secondary | ICD-10-CM | POA: Diagnosis not present

## 2024-02-03 DIAGNOSIS — G629 Polyneuropathy, unspecified: Secondary | ICD-10-CM | POA: Diagnosis not present

## 2024-02-03 DIAGNOSIS — D61811 Other drug-induced pancytopenia: Secondary | ICD-10-CM | POA: Diagnosis not present

## 2024-02-03 DIAGNOSIS — R1314 Dysphagia, pharyngoesophageal phase: Secondary | ICD-10-CM | POA: Diagnosis not present

## 2024-02-03 DIAGNOSIS — F419 Anxiety disorder, unspecified: Secondary | ICD-10-CM | POA: Diagnosis not present

## 2024-02-03 DIAGNOSIS — I10 Essential (primary) hypertension: Secondary | ICD-10-CM | POA: Diagnosis not present

## 2024-02-08 ENCOUNTER — Telehealth: Payer: Self-pay

## 2024-02-08 ENCOUNTER — Other Ambulatory Visit: Payer: Self-pay

## 2024-02-08 DIAGNOSIS — C9000 Multiple myeloma not having achieved remission: Secondary | ICD-10-CM

## 2024-02-08 DIAGNOSIS — G893 Neoplasm related pain (acute) (chronic): Secondary | ICD-10-CM

## 2024-02-08 NOTE — Progress Notes (Signed)
 Error

## 2024-02-08 NOTE — Telephone Encounter (Signed)
 Patient called and requested refill on Norco/vicodin. Information relayed to Cassie, PA to review.

## 2024-02-15 ENCOUNTER — Other Ambulatory Visit: Payer: Self-pay | Admitting: Physician Assistant

## 2024-02-15 DIAGNOSIS — C9000 Multiple myeloma not having achieved remission: Secondary | ICD-10-CM | POA: Diagnosis not present

## 2024-02-15 DIAGNOSIS — G893 Neoplasm related pain (acute) (chronic): Secondary | ICD-10-CM

## 2024-02-15 DIAGNOSIS — Z9484 Stem cells transplant status: Secondary | ICD-10-CM | POA: Diagnosis not present

## 2024-02-15 MED ORDER — HYDROCODONE-ACETAMINOPHEN 5-325 MG PO TABS
1.0000 | ORAL_TABLET | Freq: Four times a day (QID) | ORAL | 0 refills | Status: DC | PRN
Start: 2024-02-15 — End: 2024-03-08

## 2024-02-22 DIAGNOSIS — Z9484 Stem cells transplant status: Secondary | ICD-10-CM | POA: Diagnosis not present

## 2024-02-22 DIAGNOSIS — C9000 Multiple myeloma not having achieved remission: Secondary | ICD-10-CM | POA: Diagnosis not present

## 2024-02-23 DIAGNOSIS — C9000 Multiple myeloma not having achieved remission: Secondary | ICD-10-CM | POA: Diagnosis not present

## 2024-02-23 DIAGNOSIS — K1232 Oral mucositis (ulcerative) due to other drugs: Secondary | ICD-10-CM | POA: Diagnosis not present

## 2024-02-23 DIAGNOSIS — I10 Essential (primary) hypertension: Secondary | ICD-10-CM | POA: Diagnosis not present

## 2024-02-23 DIAGNOSIS — F419 Anxiety disorder, unspecified: Secondary | ICD-10-CM | POA: Diagnosis not present

## 2024-02-23 DIAGNOSIS — R1314 Dysphagia, pharyngoesophageal phase: Secondary | ICD-10-CM | POA: Diagnosis not present

## 2024-02-23 DIAGNOSIS — E7800 Pure hypercholesterolemia, unspecified: Secondary | ICD-10-CM | POA: Diagnosis not present

## 2024-02-23 DIAGNOSIS — T451X5D Adverse effect of antineoplastic and immunosuppressive drugs, subsequent encounter: Secondary | ICD-10-CM | POA: Diagnosis not present

## 2024-02-23 DIAGNOSIS — G629 Polyneuropathy, unspecified: Secondary | ICD-10-CM | POA: Diagnosis not present

## 2024-02-23 DIAGNOSIS — D61811 Other drug-induced pancytopenia: Secondary | ICD-10-CM | POA: Diagnosis not present

## 2024-02-24 ENCOUNTER — Telehealth: Payer: Self-pay

## 2024-02-24 NOTE — Telephone Encounter (Signed)
 Received call via PAL line request re gabapentin . Patient has been taking 600 mg (two capsules) gabapentin  TID. States that she was told at some point that her rx was to be escalated from 400mg  tablets to 300mg  tablets to increase her dose.   On review of last Rx, sig is actually for 900 mg (3 x 300 mg capsules) TID.   Reviewed last labs that showed cr of 1.03, estimated CrCL of 51. Noted that she is near borderline of 30-50 range for which max rec'd dose is 900 daily.  For 50-80 range, max dose 1800 per day- which she is already at.    However, she does seem to have tolerance, and denied any tremors or oversedation that can be seen sometimes with gabapentin  toxicity.  Also reported clear uncontrolled neuropathic symptoms at current dose.   Discussed that she did not need a new Rx for 400mg  tablets, and she could fill her exisiting Rx for 900 TID.  Cautioned her regarding risks of dose escalation and side effects for which she should hold additional gabapentin  doses immediately (tremors, sedation).   She stated that she should fill existing Rx in the morning.   Referring to primary myeloma transplant team, as may want to recheck renal function to ensure stability if she is trying a higher dose.   Electronically signed by:  Marsa Harle Pick, MD 02/24/2024 7:51 PM

## 2024-02-24 NOTE — Telephone Encounter (Signed)
 Received call from patient stating that she saw Dr. Fernande at [facility] and is in total remission. Patient was congratulated on her good news.  Patient reports ongoing neuropathy in her hands and feet. She stated she is currently taking gabapentin  300 mg three times daily. Patient inquired about Dr. Jeannett recommendations for further management.  Informed patient that I would relay the information to Dr. Sherrod and call her back with his recommendations. Patient voiced understanding.

## 2024-02-28 ENCOUNTER — Encounter: Payer: Self-pay | Admitting: Internal Medicine

## 2024-02-28 DIAGNOSIS — C9000 Multiple myeloma not having achieved remission: Secondary | ICD-10-CM | POA: Diagnosis not present

## 2024-02-28 DIAGNOSIS — T451X5D Adverse effect of antineoplastic and immunosuppressive drugs, subsequent encounter: Secondary | ICD-10-CM | POA: Diagnosis not present

## 2024-02-28 DIAGNOSIS — E7800 Pure hypercholesterolemia, unspecified: Secondary | ICD-10-CM | POA: Diagnosis not present

## 2024-02-28 DIAGNOSIS — F419 Anxiety disorder, unspecified: Secondary | ICD-10-CM | POA: Diagnosis not present

## 2024-02-28 DIAGNOSIS — I10 Essential (primary) hypertension: Secondary | ICD-10-CM | POA: Diagnosis not present

## 2024-02-28 DIAGNOSIS — R1314 Dysphagia, pharyngoesophageal phase: Secondary | ICD-10-CM | POA: Diagnosis not present

## 2024-02-28 DIAGNOSIS — G629 Polyneuropathy, unspecified: Secondary | ICD-10-CM | POA: Diagnosis not present

## 2024-02-28 DIAGNOSIS — D61811 Other drug-induced pancytopenia: Secondary | ICD-10-CM | POA: Diagnosis not present

## 2024-02-28 DIAGNOSIS — K1232 Oral mucositis (ulcerative) due to other drugs: Secondary | ICD-10-CM | POA: Diagnosis not present

## 2024-02-28 NOTE — Telephone Encounter (Signed)
 Tried to reach patient in regards to gabapentin  management.  LVM informing patient of Dr. Jeannett recommendations, which is to reach out to Dr. Perri as she prescribes it.  Informed patient to call with any questions or concerns.

## 2024-03-01 ENCOUNTER — Ambulatory Visit: Payer: Self-pay

## 2024-03-01 ENCOUNTER — Telehealth: Payer: Self-pay

## 2024-03-01 NOTE — Telephone Encounter (Signed)
 Received call from pt regarding upcoming appointments. Pt is sched for lab and MD appt 03/07/24. Pt aware and agrees.

## 2024-03-01 NOTE — Telephone Encounter (Signed)
 FYI Only or Action Required?: FYI only for provider: appointment scheduled on 03/02/2024.  Patient was last seen in primary care on 01/20/2024 by [name], MD.  Called Nurse Triage reporting Foot Pain.  Symptoms began She states symptoms have been ongoing for awhile.  Interventions attempted: Prescription medications: Gabapentin .  Symptoms are: gradually worsening.  Triage Disposition: See PCP When Office is Open (Within 3 Days)  Patient/caregiver understands and will follow disposition?: Yes        Message from [facility] E sent at 03/01/2024  2:22 PM EST  Summary: Neuropathy, medication questions   Reason for Triage: Pt wants to speak to a nurse about her medication and neuropathy. Please advise  Best contact: [phone]         Reason for Disposition  [1] Tingling in feet AND [2] new or increased  Answer Assessment - Initial Assessment Questions Patient states she is currently taking Gabapentin  for symptoms with no relief. Requesting to discuss other options with PCP.     1. ONSET: When did the pain start?      Ongoing for while  2. LOCATION: Where is the pain located?      Both feet and hands  3. PAIN: How bad is the pain?    (Scale 1-10; or mild, moderate, severe)     7/8 out of 10  4. CAUSE: What do you think is causing the foot pain?     Neuropathy  5. OTHER SYMPTOMS: Do you have any other symptoms? (e.g., leg pain, rash, fever, numbness)     Hand pain  Protocols used: Foot Pain-A-AH

## 2024-03-02 ENCOUNTER — Ambulatory Visit (INDEPENDENT_AMBULATORY_CARE_PROVIDER_SITE_OTHER): Admitting: Internal Medicine

## 2024-03-02 ENCOUNTER — Encounter: Payer: Self-pay | Admitting: Internal Medicine

## 2024-03-02 VITALS — BP 110/60 | HR 60 | Ht 65.0 in | Wt 177.0 lb

## 2024-03-02 DIAGNOSIS — Z8579 Personal history of other malignant neoplasms of lymphoid, hematopoietic and related tissues: Secondary | ICD-10-CM

## 2024-03-02 DIAGNOSIS — R600 Localized edema: Secondary | ICD-10-CM

## 2024-03-02 DIAGNOSIS — C9000 Multiple myeloma not having achieved remission: Secondary | ICD-10-CM

## 2024-03-02 DIAGNOSIS — G620 Drug-induced polyneuropathy: Secondary | ICD-10-CM

## 2024-03-02 DIAGNOSIS — G629 Polyneuropathy, unspecified: Secondary | ICD-10-CM | POA: Diagnosis not present

## 2024-03-02 DIAGNOSIS — I10 Essential (primary) hypertension: Secondary | ICD-10-CM

## 2024-03-02 DIAGNOSIS — E876 Hypokalemia: Secondary | ICD-10-CM

## 2024-03-02 DIAGNOSIS — Z9484 Stem cells transplant status: Secondary | ICD-10-CM

## 2024-03-02 MED ORDER — GABAPENTIN 600 MG PO TABS: 600.0000 mg | ORAL_TABLET | Freq: Three times a day (TID) | ORAL | 1 refills | Status: AC

## 2024-03-02 NOTE — Progress Notes (Signed)
 Patient Care Team: [name], MD as PCP - General (Internal Medicine)  Visit Date: 03/02/24  Subjective:    Patient ID: Joanna Ray , Female   DOB: 1950-06-29, 73 y.o.    MRN: [id number]   73 y.o. Female presents today for foot and hand pain. Patient has a past medical history of Hypertension, Multiple myeloma, Arthritis.   History of Neuropathy treated by Gabapentin  300 mg one tab three times daily. It hs not been effective in relieving her symptoms as she continues to have pain and dysthesias in her hands and feet. She also says that she has decreased range of motion in her feet that make it difficult to walk.    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 She had a plasmacytoma resection from the proximal humerus with reconstruction by Dr. Wash on 05/01/21. She then underwent  radiotherapy with Dr. Shannon.Currently under the care of Dr. Gatha, Oncologist.Had stem cell transplant July 2025. Followed at [facility] for hx of transplant   Since developing multiple myeloma in November, she has received chemotherapy with the first dose given May 27, 2023. On 11/04/2023 she underwent a autologous stem cell transplant. Continues to see her Oncologist, Dr. Sherrod here in [place] and is also being followed and treated at [facility]. Has completed 5 cycles of chemotherapy with daratumumab , Velcade  weekly Revlimid  25 orally 14 days every 3 weeks in addition to Decadron  20 mg weekly.   Additional medical issues include, HTN,hyperlipidemia, LE edema, hypokalemia secondary to diuretic.   Past Medical History:  Diagnosis Date   Anxiety    Arthritis    Cancer (HCC) 2021   multiple myloma Right Arm   Cataract    GERD (gastroesophageal reflux disease)    Hyperlipidemia    Hypertension    Urticaria      Family History  Problem  Relation Age of Onset   Stroke Mother    Stroke Father    Diabetes Sister    Breast cancer Sister 1   Ovarian cancer Maternal Grandmother 6   Colon cancer Neg Hx    Colon polyps Neg Hx    Esophageal cancer Neg Hx    Rectal cancer Neg Hx    Stomach cancer Neg Hx   Father died at age 2 of a stroke. Mother died age 72 with history of stroke in 2010. 1 brother with history of hypertension and MI. 1 sister with history of diabetes. 1 sister with history of hypertension and another sister with history of hypertension. Son with history of hypertension. She lost her son in the summer 2019 and an automobile accident.   Social Hx: Husband is retired and has CHF. Pt has not smoked in over 20 years. 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 Does not consume alcohol.    Review of Systems  Neurological:  Positive for tingling.        Objective:   Vitals: BP 110/60   Pulse 60   Ht 5' 5 (1.651 m)   Wt 177 lb (80.3 kg)   SpO2 98%   BMI 29.45 kg/m    Physical Exam Vitals and nursing note reviewed.     Neck is suuple without thyromegaly or bruits. Cjest is clear. Cor RRR. Trace LE edema  Results:     Labs:       Component Value Date/Time  NA 145 01/18/2024 0945   NA 145 (H) 12/28/2018 1436   K 4.7 01/18/2024 0945   CL 108 01/18/2024 0945   CO2 30 01/18/2024 0945   GLUCOSE 94 01/18/2024 0945   BUN 16 01/18/2024 0945   BUN 15 12/28/2018 1436   CREATININE 0.92 01/18/2024 0945   CALCIUM  9.6 01/18/2024 0945   PROT 6.0 (L) 01/18/2024 0945   PROT 7.7 12/28/2018 1436   ALBUMIN 4.2 01/05/2024 1315   ALBUMIN 4.6 12/28/2018 1436   AST 15 01/18/2024 0945   AST 14 (L) 01/05/2024 1315   ALT 8 01/18/2024 0945   ALT 8 01/05/2024 1315   ALKPHOS 44 01/05/2024 1315   BILITOT 0.5 01/18/2024 0945   BILITOT 0.7 01/05/2024 1315   GFRNONAA >60 01/05/2024 1315   GFRNONAA 50 (L) 09/03/2020 0909   GFRAA 58 (L) 09/03/2020 0909     Lab Results  Component Value Date    WBC 3.9 01/18/2024   HGB 10.4 (L) 01/18/2024   HCT 32.3 (L) 01/18/2024   MCV 97.0 01/18/2024   PLT 217 01/18/2024    Lab Results  Component Value Date   CHOL 174 01/18/2024   HDL 67 01/18/2024   LDLCALC 91 01/18/2024   TRIG 70 01/18/2024   CHOLHDL 2.6 01/18/2024    Lab Results  Component Value Date   HGBA1C 5.7 (H) 09/03/2020     Lab Results  Component Value Date   TSH 1.67 01/18/2024        Assessment & Plan:   Orders Placed This Encounter  Procedures   Ambulatory referral to Neurology    Referral Priority:   Routine    Referral Type:   Consultation    Referral Reason:   Specialty Services Required    Requested Specialty:   Neurology    Number of Visits Requested:   1   Meds ordered this encounter  Medications   gabapentin  (NEURONTIN ) 600 MG tablet    Sig: Take 1 tablet (600 mg total) by mouth 3 (three) times daily.    Dispense:  90 tablet    Refill:  1    Neuropathy: treated by Gabapentin  300 mg one tab three times daily. It hs not been effective in relieving her symptoms as she continues to have neuropathy in her hands and feet. She also says that she has decreased range of motion in her feet that make it difficult to walk.     Gabapentin  increased from 300 mg to 600 mg three times daily.   Referred to Neurology for evaluation of periphetal neuropathy which is likely due to chemotherapy. Gabapentin  is not helping neuropathy.Not sure what other options are available at this point but perhaps neurology has some ideas.  Multiple myeloma IgG subtype: initially presenting as plasmacytoma of the proximal right humerus diagnosed in February 2022 with development of full-blown Multiple myeloma in November 2024. She had a plasmacytoma resection from the proximal humerus with reconstruction by Dr. Wash on 05/01/21. She then underwent  radiotherapy with Dr. Shannon. Since developing multiple myeloma in November, she has received chemotherapy with the first dose given  May 27, 2023. On 11/04/2023 she underwent a autologous stem cell transplant. Continues to see her Oncologist, Dr. Sherrod here in [place] and is also being followed and treated at [facility]. Has completed 5 cycles of chemotherapy with daratumumab , Velcade  weekly Revlimid  25 orally 14 days every 3 weeks in addition to Decadron  20 mg weekly.   I,Makayla C Reid,acting as a neurosurgeon for [employer],  MD.,have documented all relevant documentation on the behalf of Ronal JINNY Hailstone, MD,as directed by  Ronal JINNY Hailstone, MD while in the presence of Ronal JINNY Hailstone, MD.   30 minutes spent with chart review, seeing patient, discussing neuroapthy carse and possible treatment options. Is already on gabapentin .I, Ronal JINNY Hailstone, MD, have reviewed all documentation for this visit. The documentation on 03/02/2024 for the exam, diagnosis, procedures, and orders are all accurate and complete.

## 2024-03-05 NOTE — Patient Instructions (Signed)
 Referral to [facility] Neurology to evaluate peripheral neuropathy due to chemotherapy.

## 2024-03-06 DIAGNOSIS — F325 Major depressive disorder, single episode, in full remission: Secondary | ICD-10-CM | POA: Diagnosis not present

## 2024-03-06 DIAGNOSIS — N182 Chronic kidney disease, stage 2 (mild): Secondary | ICD-10-CM | POA: Diagnosis not present

## 2024-03-06 DIAGNOSIS — I130 Hypertensive heart and chronic kidney disease with heart failure and stage 1 through stage 4 chronic kidney disease, or unspecified chronic kidney disease: Secondary | ICD-10-CM | POA: Diagnosis not present

## 2024-03-06 DIAGNOSIS — R001 Bradycardia, unspecified: Secondary | ICD-10-CM | POA: Diagnosis not present

## 2024-03-06 DIAGNOSIS — D529 Folate deficiency anemia, unspecified: Secondary | ICD-10-CM | POA: Diagnosis not present

## 2024-03-06 DIAGNOSIS — G620 Drug-induced polyneuropathy: Secondary | ICD-10-CM | POA: Diagnosis not present

## 2024-03-06 DIAGNOSIS — E1142 Type 2 diabetes mellitus with diabetic polyneuropathy: Secondary | ICD-10-CM | POA: Diagnosis not present

## 2024-03-06 DIAGNOSIS — E876 Hypokalemia: Secondary | ICD-10-CM | POA: Diagnosis not present

## 2024-03-06 DIAGNOSIS — I509 Heart failure, unspecified: Secondary | ICD-10-CM | POA: Diagnosis not present

## 2024-03-06 DIAGNOSIS — I700 Atherosclerosis of aorta: Secondary | ICD-10-CM | POA: Diagnosis not present

## 2024-03-06 DIAGNOSIS — E785 Hyperlipidemia, unspecified: Secondary | ICD-10-CM | POA: Diagnosis not present

## 2024-03-06 DIAGNOSIS — R1116 Cannabis hyperemesis syndrome: Secondary | ICD-10-CM | POA: Diagnosis not present

## 2024-03-06 DIAGNOSIS — Z79891 Long term (current) use of opiate analgesic: Secondary | ICD-10-CM | POA: Diagnosis not present

## 2024-03-06 DIAGNOSIS — B009 Herpesviral infection, unspecified: Secondary | ICD-10-CM | POA: Diagnosis not present

## 2024-03-06 DIAGNOSIS — K219 Gastro-esophageal reflux disease without esophagitis: Secondary | ICD-10-CM | POA: Diagnosis not present

## 2024-03-06 DIAGNOSIS — E1122 Type 2 diabetes mellitus with diabetic chronic kidney disease: Secondary | ICD-10-CM | POA: Diagnosis not present

## 2024-03-06 DIAGNOSIS — C9001 Multiple myeloma in remission: Secondary | ICD-10-CM | POA: Diagnosis not present

## 2024-03-06 DIAGNOSIS — Z9989 Dependence on other enabling machines and devices: Secondary | ICD-10-CM | POA: Diagnosis not present

## 2024-03-07 ENCOUNTER — Inpatient Hospital Stay: Admitting: Internal Medicine

## 2024-03-07 ENCOUNTER — Inpatient Hospital Stay: Attending: Internal Medicine

## 2024-03-07 VITALS — BP 133/73 | HR 77 | Temp 97.6°F | Resp 17 | Ht 65.0 in | Wt 180.0 lb

## 2024-03-07 DIAGNOSIS — C9000 Multiple myeloma not having achieved remission: Secondary | ICD-10-CM | POA: Insufficient documentation

## 2024-03-07 DIAGNOSIS — Z7901 Long term (current) use of anticoagulants: Secondary | ICD-10-CM | POA: Diagnosis not present

## 2024-03-07 DIAGNOSIS — Z79624 Long term (current) use of inhibitors of nucleotide synthesis: Secondary | ICD-10-CM | POA: Insufficient documentation

## 2024-03-07 DIAGNOSIS — Z923 Personal history of irradiation: Secondary | ICD-10-CM | POA: Diagnosis not present

## 2024-03-07 DIAGNOSIS — H04129 Dry eye syndrome of unspecified lacrimal gland: Secondary | ICD-10-CM | POA: Diagnosis not present

## 2024-03-07 DIAGNOSIS — D61811 Other drug-induced pancytopenia: Secondary | ICD-10-CM | POA: Diagnosis not present

## 2024-03-07 DIAGNOSIS — Z949 Transplanted organ and tissue status, unspecified: Secondary | ICD-10-CM | POA: Insufficient documentation

## 2024-03-07 DIAGNOSIS — Z7982 Long term (current) use of aspirin: Secondary | ICD-10-CM | POA: Insufficient documentation

## 2024-03-07 DIAGNOSIS — C9001 Multiple myeloma in remission: Secondary | ICD-10-CM | POA: Diagnosis not present

## 2024-03-07 DIAGNOSIS — D801 Nonfamilial hypogammaglobulinemia: Secondary | ICD-10-CM | POA: Diagnosis not present

## 2024-03-07 DIAGNOSIS — M129 Arthropathy, unspecified: Secondary | ICD-10-CM | POA: Diagnosis not present

## 2024-03-07 DIAGNOSIS — E785 Hyperlipidemia, unspecified: Secondary | ICD-10-CM | POA: Insufficient documentation

## 2024-03-07 DIAGNOSIS — T451X5D Adverse effect of antineoplastic and immunosuppressive drugs, subsequent encounter: Secondary | ICD-10-CM | POA: Diagnosis not present

## 2024-03-07 DIAGNOSIS — E7800 Pure hypercholesterolemia, unspecified: Secondary | ICD-10-CM | POA: Diagnosis not present

## 2024-03-07 DIAGNOSIS — G620 Drug-induced polyneuropathy: Secondary | ICD-10-CM | POA: Insufficient documentation

## 2024-03-07 DIAGNOSIS — K219 Gastro-esophageal reflux disease without esophagitis: Secondary | ICD-10-CM | POA: Insufficient documentation

## 2024-03-07 DIAGNOSIS — G629 Polyneuropathy, unspecified: Secondary | ICD-10-CM | POA: Diagnosis not present

## 2024-03-07 DIAGNOSIS — I10 Essential (primary) hypertension: Secondary | ICD-10-CM | POA: Insufficient documentation

## 2024-03-07 DIAGNOSIS — Z79899 Other long term (current) drug therapy: Secondary | ICD-10-CM | POA: Diagnosis not present

## 2024-03-07 DIAGNOSIS — K1232 Oral mucositis (ulcerative) due to other drugs: Secondary | ICD-10-CM | POA: Diagnosis not present

## 2024-03-07 DIAGNOSIS — R1314 Dysphagia, pharyngoesophageal phase: Secondary | ICD-10-CM | POA: Diagnosis not present

## 2024-03-07 DIAGNOSIS — F419 Anxiety disorder, unspecified: Secondary | ICD-10-CM | POA: Diagnosis not present

## 2024-03-07 LAB — CMP (CANCER CENTER ONLY)
ALT: 12 U/L (ref 0–44)
AST: 15 U/L (ref 15–41)
Albumin: 4.3 g/dL (ref 3.5–5.0)
Alkaline Phosphatase: 48 U/L (ref 38–126)
Anion gap: 7 (ref 5–15)
BUN: 31 mg/dL — ABNORMAL HIGH (ref 8–23)
CO2: 29 mmol/L (ref 22–32)
Calcium: 9.3 mg/dL (ref 8.9–10.3)
Chloride: 107 mmol/L (ref 98–111)
Creatinine: 1.21 mg/dL — ABNORMAL HIGH (ref 0.44–1.00)
GFR, Estimated: 47 mL/min — ABNORMAL LOW (ref >60)
Glucose, Bld: 93 mg/dL (ref 70–99)
Potassium: 4 mmol/L (ref 3.5–5.1)
Sodium: 143 mmol/L (ref 135–145)
Total Bilirubin: 0.5 mg/dL (ref 0.0–1.2)
Total Protein: 6.4 g/dL — ABNORMAL LOW (ref 6.5–8.1)

## 2024-03-07 LAB — LACTATE DEHYDROGENASE: LDH: 162 U/L (ref 105–235)

## 2024-03-07 LAB — CBC WITH DIFFERENTIAL (CANCER CENTER ONLY)
Abs Immature Granulocytes: 0.02 K/uL (ref 0.00–0.07)
Basophils Absolute: 0.1 K/uL (ref 0.0–0.1)
Basophils Relative: 1 %
Eosinophils Absolute: 0.1 K/uL (ref 0.0–0.5)
Eosinophils Relative: 3 %
HCT: 34.4 % — ABNORMAL LOW (ref 36.0–46.0)
Hemoglobin: 11.2 g/dL — ABNORMAL LOW (ref 12.0–15.0)
Immature Granulocytes: 1 %
Lymphocytes Relative: 33 %
Lymphs Abs: 1.4 K/uL (ref 0.7–4.0)
MCH: 31.1 pg (ref 26.0–34.0)
MCHC: 32.6 g/dL (ref 30.0–36.0)
MCV: 95.6 fL (ref 80.0–100.0)
Monocytes Absolute: 0.6 K/uL (ref 0.1–1.0)
Monocytes Relative: 15 %
Neutro Abs: 2.1 K/uL (ref 1.7–7.7)
Neutrophils Relative %: 47 %
Platelet Count: 182 K/uL (ref 150–400)
RBC: 3.6 MIL/uL — ABNORMAL LOW (ref 3.87–5.11)
RDW: 13.5 % (ref 11.5–15.5)
WBC Count: 4.3 K/uL (ref 4.0–10.5)
nRBC: 0 % (ref 0.0–0.2)

## 2024-03-07 NOTE — Progress Notes (Signed)
 [facility] [phone]   [phone]  OFFICE PROGRESS NOTE  Joanna Ronal PARAS, MD [address] [place] [address]  DIAGNOSIS: Multiple myeloma IgG subtype initially diagnosed as plasmacytoma of the proximal right humerus diagnosed in February 2022 with full-blown multiple myeloma in November 2024.  PRIOR THERAPY:  1) Palliative radiotherapy to the plasmacytoma of the proximal right humerus under the care of Dr. Shannon. 2) Status post tumor resection from the proximal humerus with reconstruction under the care of Dr. Wash at [facility] on May 01, 2021. 3) Systemic treatment with subcutaneous daratumumab , subcutaneous Velcade  on weekly basis, Revlimid  25 mg p.o. for 14 days every 3 weeks in addition to Decadron  20 mg p.o. weekly during the course of chemotherapy.  First dose May 27, 2023.  Status post 5 cycles. 4) autologous stem cell transplant at [facility] on November 04, 2023.  CURRENT THERAPY: None.  INTERVAL HISTORY: Joanna Ray 73 y.o. female returns to the clinic today for follow-up visit accompanied by her sister.Discussed the use of AI scribe software for clinical note transcription with the patient, who gave verbal consent to proceed.  History of Present Illness Joanna Ray is a 73 year old female with multiple myeloma who presents for evaluation and repeat blood work. She is accompanied by her sister, Joanna Ray.  Diagnosed with multiple myeloma in November 2024, she underwent systemic chemotherapy with subcutaneous daratumumab , subcutaneous Velcade , Revlimid , and Decadron  for five cycles, followed by an autologous stem cell transplant on November 04, 2023. She is currently under observation.  She experiences persistent neuropathy, which she finds bothersome. Gabapentin  has been prescribed, but she dislikes it due to its sedative effects, which interfere with her active  lifestyle.  She has been informed that her IgG levels are below 500 mg/dL following her transplant. She is not currently receiving intravenous immunoglobulin (IVIG) therapy.  She has received dental clearance for Zometa  infusion.  She reports occasional redness in her eyes, likely due to dryness, and has some eye drops but has not been using them regularly. No other complaints or issues aside from neuropathy and occasional eye redness.     MEDICAL HISTORY: Past Medical History:  Diagnosis Date   Anxiety    Arthritis    Cancer (HCC) 2021   multiple myloma Right Arm   Cataract    GERD (gastroesophageal reflux disease)    Hyperlipidemia    Hypertension    Urticaria     ALLERGIES:  is allergic to corn-containing products and shellfish allergy.  MEDICATIONS:  Current Outpatient Medications  Medication Sig Dispense Refill   acyclovir  (ZOVIRAX ) 400 MG tablet Take 1 tablet (400 mg total) by mouth 2 (two) times daily. 60 tablet 5   ALPRAZolam  (XANAX ) 0.5 MG tablet Take 1 tablet by mouth twice daily as needed for anxiety 60 tablet 5   amLODipine  (NORVASC ) 5 MG tablet Take 1 tablet by mouth once daily 30 tablet 6   apixaban  (ELIQUIS ) 2.5 MG TABS tablet Take 1 tablet (2.5 mg total) by mouth 2 (two) times daily. 60 tablet 2   aspirin  81 MG tablet Take 81 mg by mouth daily.     cholecalciferol  (VITAMIN D ) 1000 UNITS tablet Take 1,000 Units by mouth daily.     cyclobenzaprine  (FLEXERIL ) 10 MG tablet Take 1 tablet by mouth three times daily as needed for muscle spasm 90 tablet 0   EPINEPHrine  (EPIPEN  2-PAK) 0.3 mg/0.3 mL IJ SOAJ  injection Inject 0.3 mg into the muscle as needed for anaphylaxis. 1 each PRN   ergocalciferol  (DRISDOL ) 1.25 MG (50000 UT) capsule Take 1 capsule (50,000 Units total) by mouth once a week. 12 capsule 3   fexofenadine  (ALLEGRA ) 180 MG tablet Take 1 tablet (180 mg total) by mouth daily. 90 tablet 1   furosemide  (LASIX ) 20 MG tablet TAKE 1 TABLET BY MOUTH ONCE DAILY FOR  LEG SWELLING 90 tablet 1   gabapentin  (NEURONTIN ) 600 MG tablet Take 1 tablet (600 mg total) by mouth 3 (three) times daily. 90 tablet 1   HYDROcodone -acetaminophen  (NORCO/VICODIN) 5-325 MG tablet Take 1 tablet by mouth every 6 (six) hours as needed for moderate pain (pain score 4-6). 30 tablet 0   hydrocortisone valerate cream (WESTCORT) 0.2 % 1 app     olmesartan  (BENICAR ) 20 MG tablet Take 1 tablet by mouth once daily 90 tablet 3   ondansetron  (ZOFRAN ) 8 MG tablet Take 1 tablet (8 mg total) by mouth every 8 (eight) hours as needed for nausea or vomiting. 30 tablet 1   potassium chloride  SA (KLOR-CON  M) 20 MEQ tablet Take 1 tablet (20 mEq total) by mouth 2 (two) times daily. 60 tablet 2   prochlorperazine  (COMPAZINE ) 10 MG tablet TAKE 1 TABLET BY MOUTH EVERY 6 HOURS AS NEEDED FOR NAUSEA FOR VOMITING 30 tablet 0   rosuvastatin  (CRESTOR ) 20 MG tablet TAKE 1/2 TABLET ONE TIME DAILY 45 tablet 3   vitamin E  100 UNIT capsule Take 100 Units by mouth daily.     No current facility-administered medications for this visit.    SURGICAL HISTORY:  Past Surgical History:  Procedure Laterality Date   CATARACT EXTRACTION, BILATERAL Bilateral    DILATION AND CURETTAGE OF UTERUS      REVIEW OF SYSTEMS:  Constitutional: positive for fatigue Eyes: negative Ears, nose, mouth, throat, and face: negative Respiratory: negative Cardiovascular: negative Gastrointestinal: negative Genitourinary:negative Integument/breast: negative Hematologic/lymphatic: negative Musculoskeletal:negative Neurological: positive for paresthesia Behavioral/Psych: negative Endocrine: negative Allergic/Immunologic: negative   PHYSICAL EXAMINATION: General appearance: alert, cooperative, and no distress Head: Normocephalic, without obvious abnormality, atraumatic Neck: no adenopathy, no JVD, supple, symmetrical, trachea midline, and thyroid  not enlarged, symmetric, no tenderness/mass/nodules Lymph nodes: Cervical,  supraclavicular, and axillary nodes normal. Resp: clear to auscultation bilaterally Back: symmetric, no curvature. ROM normal. No CVA tenderness. Cardio: regular rate and rhythm, S1, S2 normal, no murmur, click, rub or gallop GI: soft, non-tender; bowel sounds normal; no masses,  no organomegaly Extremities: edema trace edema bilateral Neurologic: Alert and oriented X 3, normal strength and tone. Normal symmetric reflexes. Normal coordination and gait  ECOG PERFORMANCE STATUS: 1 - Symptomatic but completely ambulatory  Blood pressure 133/73, pulse 77, temperature 97.6 F (36.4 C), temperature source Temporal, resp. rate 17, height 5' 5 (1.651 m), weight 180 lb (81.6 kg), SpO2 100%.  LABORATORY DATA: Lab Results  Component Value Date   WBC 4.3 03/07/2024   HGB 11.2 (L) 03/07/2024   HCT 34.4 (L) 03/07/2024   MCV 95.6 03/07/2024   PLT 182 03/07/2024      Chemistry      Component Value Date/Time   NA 145 01/18/2024 0945   NA 145 (H) 12/28/2018 1436   K 4.7 01/18/2024 0945   CL 108 01/18/2024 0945   CO2 30 01/18/2024 0945   BUN 16 01/18/2024 0945   BUN 15 12/28/2018 1436   CREATININE 0.92 01/18/2024 0945      Component Value Date/Time   CALCIUM  9.6 01/18/2024 0945  ALKPHOS 44 01/05/2024 1315   AST 15 01/18/2024 0945   AST 14 (L) 01/05/2024 1315   ALT 8 01/18/2024 0945   ALT 8 01/05/2024 1315   BILITOT 0.5 01/18/2024 0945   BILITOT 0.7 01/05/2024 1315       RADIOGRAPHIC STUDIES: No results found.   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  The patient had extensive work-up for multiple myeloma including a bone marrow biopsy and aspirate that showed only 6% plasma cells.  Her protein studies still suspicious for underlying MGUS/multiple myeloma but the findings are not enough to call it multiple myeloma at this point. The patient underwent radiotherapy  to the plasmacytoma of the proximal right humerus and tolerated the procedure fairly well.  She is currently on observation.  The skeletal bone survey showed no other lytic bone lesion except the proximal right humerus. The patient was treated with palliative radiotherapy in the past but recent skeletal bone survey showed progression of the lytic lesion in the proximal humeral diaphysis with pathologic fracture.  She underwent surgical resection of the tumor with reconstruction under the care of Dr. Wash at [facility] on May 01, 2021.   The patient was found on recent protein study to have elevated IgG level.  She underwent a bone marrow biopsy and aspirate that showed 10-20% plasma cells. She had skeletal bone survey performed recently that showed new lytic lesions in the proximal left femoral shaft and midshaft of the right femur consistent with myelomatous involvement or other lytic metastasis.  There was also scattered areas of endosteal scalloping in the left humeral shaft concerning for myelomatous involvement. The patient started systemic treatment with subcutaneous daratumumab , subcutaneous Velcade , Revlimid  and Decadron  every 3 weeks cycle on May 27, 2023.  She status post 5 cycles and tolerated the first cycle of her treatment fairly well. She will start her treatment with Zometa  for the bone disease after we received the clearance from her dentist. The patient underwent autologous stem cell transplant at [facility] on November 04, 2023 and she is doing fine. The patient is doing fine today with no concerning complaints except for the neuropathy. Assessment and Plan Assessment & Plan Multiple myeloma post-autologous stem cell transplant, under observation Multiple myeloma diagnosed in November 2024, status post induction chemotherapy and autologous stem cell transplant on July 10th, 2025. Currently under observation. - Continue observation - Will  discuss maintenance treatment with Revlimid  and daratumumab  in early January  Chemotherapy-induced peripheral neuropathy Persistent peripheral neuropathy following chemotherapy. Gabapentin  causes excessive relaxation, which is undesirable for her active lifestyle. - Continue gabapentin  at nighttime to minimize daytime sedation  Hypogammaglobulinemia post-transplant IgG levels less than 500, increasing risk of infection. No current IVIG treatment. - Will arrange monthly IVIG treatment to maintain IgG levels above 500  Bone health management post-myeloma (planned Zometa  infusion) Bone health management post-myeloma with planned Zometa  infusion to strengthen bones. Dental clearance obtained. - Will administer Zometa  infusion in January  Dry eye syndrome Intermittent redness of eyes, likely related to treatment. No current use of eye drops. - Use sustained eye drops to maintain moisture She was advised to call immediately if she has any other concerning symptoms in the interval.  The patient voices understanding of current disease status and treatment options and is in agreement with the current care plan.  All questions were answered. The patient knows to  call the clinic with any problems, questions or concerns. We can certainly see the patient much sooner if necessary.  The total time spent in the appointment was 30 minutes.  Disclaimer: This note was dictated with voice recognition software. Similar sounding words can inadvertently be transcribed and may not be corrected upon review.

## 2024-03-08 ENCOUNTER — Other Ambulatory Visit: Payer: Self-pay | Admitting: Physician Assistant

## 2024-03-08 DIAGNOSIS — G893 Neoplasm related pain (acute) (chronic): Secondary | ICD-10-CM

## 2024-03-08 DIAGNOSIS — C9000 Multiple myeloma not having achieved remission: Secondary | ICD-10-CM

## 2024-03-08 MED ORDER — HYDROCODONE-ACETAMINOPHEN 5-325 MG PO TABS: 1.0000 | ORAL_TABLET | Freq: Four times a day (QID) | ORAL | 0 refills | Status: DC | PRN

## 2024-03-14 DIAGNOSIS — G629 Polyneuropathy, unspecified: Secondary | ICD-10-CM | POA: Diagnosis not present

## 2024-03-16 ENCOUNTER — Telehealth: Payer: Self-pay

## 2024-03-16 ENCOUNTER — Other Ambulatory Visit: Payer: Self-pay

## 2024-03-16 ENCOUNTER — Telehealth: Payer: Self-pay | Admitting: Physician Assistant

## 2024-03-16 DIAGNOSIS — G620 Drug-induced polyneuropathy: Secondary | ICD-10-CM

## 2024-03-16 NOTE — Telephone Encounter (Signed)
 Copied from CRM [id number]. Topic: Referral - Question >> Mar 15, 2024 11:50 AM Rea ORN wrote: Reason for CRM: Pt called to advise she can't be seen with Neurology until April 2026. Pt is asking that the referral be sent somewhere else so she can be seen sooner.  Please call back [phone]

## 2024-03-16 NOTE — Telephone Encounter (Signed)
 I received a message that her IVIG which was scheduled for tomorrow 11/21 has not been authorized yet by insurance. I called the patient and let her know I have cancelled her infusion and we will reschedule once it is approved. She is aware.

## 2024-03-17 ENCOUNTER — Inpatient Hospital Stay

## 2024-03-17 ENCOUNTER — Encounter: Payer: Self-pay | Admitting: Physician Assistant

## 2024-03-21 ENCOUNTER — Encounter: Payer: Self-pay | Admitting: Neurology

## 2024-03-22 DIAGNOSIS — E7800 Pure hypercholesterolemia, unspecified: Secondary | ICD-10-CM | POA: Diagnosis not present

## 2024-03-22 DIAGNOSIS — F419 Anxiety disorder, unspecified: Secondary | ICD-10-CM | POA: Diagnosis not present

## 2024-03-22 DIAGNOSIS — K1232 Oral mucositis (ulcerative) due to other drugs: Secondary | ICD-10-CM | POA: Diagnosis not present

## 2024-03-22 DIAGNOSIS — I10 Essential (primary) hypertension: Secondary | ICD-10-CM | POA: Diagnosis not present

## 2024-03-22 DIAGNOSIS — R1314 Dysphagia, pharyngoesophageal phase: Secondary | ICD-10-CM | POA: Diagnosis not present

## 2024-03-22 DIAGNOSIS — D61811 Other drug-induced pancytopenia: Secondary | ICD-10-CM | POA: Diagnosis not present

## 2024-03-22 DIAGNOSIS — C9000 Multiple myeloma not having achieved remission: Secondary | ICD-10-CM | POA: Diagnosis not present

## 2024-03-22 DIAGNOSIS — G629 Polyneuropathy, unspecified: Secondary | ICD-10-CM | POA: Diagnosis not present

## 2024-03-22 DIAGNOSIS — T451X5D Adverse effect of antineoplastic and immunosuppressive drugs, subsequent encounter: Secondary | ICD-10-CM | POA: Diagnosis not present

## 2024-03-24 ENCOUNTER — Other Ambulatory Visit: Payer: Self-pay | Admitting: Hematology and Oncology

## 2024-03-24 ENCOUNTER — Other Ambulatory Visit: Payer: Self-pay

## 2024-03-24 DIAGNOSIS — C9000 Multiple myeloma not having achieved remission: Secondary | ICD-10-CM

## 2024-03-24 DIAGNOSIS — G893 Neoplasm related pain (acute) (chronic): Secondary | ICD-10-CM

## 2024-03-24 MED ORDER — HYDROCODONE-ACETAMINOPHEN 5-325 MG PO TABS
1.0000 | ORAL_TABLET | Freq: Four times a day (QID) | ORAL | 0 refills | Status: AC | PRN
Start: 2024-03-24 — End: ?

## 2024-03-25 ENCOUNTER — Other Ambulatory Visit: Payer: Self-pay

## 2024-03-25 ENCOUNTER — Encounter (HOSPITAL_BASED_OUTPATIENT_CLINIC_OR_DEPARTMENT_OTHER): Payer: Self-pay

## 2024-03-25 ENCOUNTER — Emergency Department (HOSPITAL_BASED_OUTPATIENT_CLINIC_OR_DEPARTMENT_OTHER): Admitting: Radiology

## 2024-03-25 ENCOUNTER — Emergency Department (HOSPITAL_BASED_OUTPATIENT_CLINIC_OR_DEPARTMENT_OTHER)
Admission: EM | Admit: 2024-03-25 | Discharge: 2024-03-25 | Disposition: A | Discharge status: Home / Self Care | Attending: Emergency Medicine | Admitting: Emergency Medicine

## 2024-03-25 DIAGNOSIS — I10 Essential (primary) hypertension: Secondary | ICD-10-CM | POA: Insufficient documentation

## 2024-03-25 DIAGNOSIS — J069 Acute upper respiratory infection, unspecified: Secondary | ICD-10-CM | POA: Insufficient documentation

## 2024-03-25 DIAGNOSIS — J029 Acute pharyngitis, unspecified: Secondary | ICD-10-CM | POA: Diagnosis present

## 2024-03-25 DIAGNOSIS — R058 Other specified cough: Secondary | ICD-10-CM | POA: Diagnosis not present

## 2024-03-25 DIAGNOSIS — R059 Cough, unspecified: Secondary | ICD-10-CM | POA: Diagnosis not present

## 2024-03-25 DIAGNOSIS — Z7982 Long term (current) use of aspirin: Secondary | ICD-10-CM | POA: Diagnosis not present

## 2024-03-25 DIAGNOSIS — Z8579 Personal history of other malignant neoplasms of lymphoid, hematopoietic and related tissues: Secondary | ICD-10-CM | POA: Diagnosis not present

## 2024-03-25 LAB — RESP PANEL BY RT-PCR (RSV, FLU A&B, COVID)  RVPGX2
Influenza A by PCR: NEGATIVE
Influenza B by PCR: NEGATIVE
Resp Syncytial Virus by PCR: NEGATIVE
SARS Coronavirus 2 by RT PCR: NEGATIVE

## 2024-03-25 LAB — GROUP A STREP BY PCR: Group A Strep by PCR: NOT DETECTED

## 2024-03-25 NOTE — ED Provider Notes (Signed)
 [place] EMERGENCY DEPARTMENT AT [facility] Provider Note   CSN: [id number] Arrival date & time: 03/25/24  1339     Patient presents with: Sore Throat and Cough   Joanna Ray is a 73 y.o. female with history of multiple myeloma, hypertension, hyperlipidemia, presents with concern for a productive cough with brown mucus, sore throat, and congestion for the past 2 days.  She denies any difficulties with swallowing.  Denies any fever or chills.  No abdominal pain, nausea, vomiting, or diarrhea.  Reports her husband is sick with similar symptoms.    Sore Throat  Cough      Prior to Admission medications   Medication Sig Start Date End Date Taking? Authorizing Provider  ALPRAZolam  (XANAX ) 0.5 MG tablet Take 1 tablet by mouth twice daily as needed for anxiety 11/03/23   [name], MD  amLODipine  (NORVASC ) 5 MG tablet Take 1 tablet by mouth once daily 11/23/23   [name], MD  apixaban  (ELIQUIS ) 2.5 MG TABS tablet Take 1 tablet (2.5 mg total) by mouth 2 (two) times daily. 06/08/23   Heilingoetter, Cassandra L, PA-C  aspirin  81 MG tablet Take 81 mg by mouth daily.    [provider]  cholecalciferol  (VITAMIN D ) 1000 UNITS tablet Take 1,000 Units by mouth daily.    [provider]  cyclobenzaprine  (FLEXERIL ) 10 MG tablet Take 1 tablet by mouth three times daily as needed for muscle spasm 05/20/23   [name], MD  EPINEPHrine  (EPIPEN  2-PAK) 0.3 mg/0.3 mL IJ SOAJ injection Inject 0.3 mg into the muscle as needed for anaphylaxis. 07/14/22   [name], MD  ergocalciferol  (DRISDOL ) 1.25 MG (50000 UT) capsule Take 1 capsule (50,000 Units total) by mouth once a week. 10/15/23   [name], MD  fexofenadine  (ALLEGRA ) 180 MG tablet Take 1 tablet (180 mg total) by mouth daily. 06/29/19   Jeneal Danita Macintosh, MD  furosemide  (LASIX ) 20 MG tablet TAKE 1 TABLET BY MOUTH ONCE DAILY FOR LEG SWELLING 02/02/24   [name], MD  gabapentin   (NEURONTIN ) 600 MG tablet Take 1 tablet (600 mg total) by mouth 3 (three) times daily. 03/02/24   [name], MD  HYDROcodone -acetaminophen  (NORCO/VICODIN) 5-325 MG tablet Take 1 tablet by mouth every 6 (six) hours as needed for moderate pain (pain score 4-6). 03/24/24   Lonn Hicks, MD  hydrocortisone  valerate cream (WESTCORT ) 0.2 % 1 app    [provider]  olmesartan  (BENICAR ) 20 MG tablet Take 1 tablet by mouth once daily 01/18/24   [name], MD  potassium chloride  SA (KLOR-CON  M) 20 MEQ tablet Take 1 tablet (20 mEq total) by mouth 2 (two) times daily. 10/18/23   [name], MD  rosuvastatin  (CRESTOR ) 20 MG tablet TAKE 1/2 TABLET ONE TIME DAILY 12/13/23   [name], MD  vitamin E  100 UNIT capsule Take 100 Units by mouth daily.    [provider]    Allergies: Corn-containing products and Shellfish allergy    Review of Systems  Respiratory:  Positive for cough.     Updated Vital Signs BP 122/66 (BP Location: Left Arm)   Pulse 76   Temp 98.6 F (37 C) (Oral)   Resp 17   SpO2 99%   Physical Exam Vitals and nursing note reviewed.  Constitutional:      General: She is not in acute distress.    Appearance: She is well-developed.  HENT:     Head:  Normocephalic and atraumatic.     Mouth/Throat:     Pharynx: No oropharyngeal exudate or posterior oropharyngeal erythema.     Comments: Swallowing without difficulty No peritonsillar abscess Eyes:     Conjunctiva/sclera: Conjunctivae normal.  Cardiovascular:     Rate and Rhythm: Normal rate and regular rhythm.     Heart sounds: No murmur heard. Pulmonary:     Effort: Pulmonary effort is normal. No respiratory distress.     Breath sounds: Normal breath sounds.  Abdominal:     Palpations: Abdomen is soft.     Tenderness: There is no abdominal tenderness.  Musculoskeletal:        General: No swelling.     Cervical back: Neck supple.  Skin:    General: Skin is warm and dry.     Capillary Refill:  Capillary refill takes less than 2 seconds.  Neurological:     Mental Status: She is alert.  Psychiatric:        Mood and Affect: Mood normal.     (all labs ordered are listed, but only abnormal results are displayed) Labs Reviewed  RESP PANEL BY RT-PCR (RSV, FLU A&B, COVID)  RVPGX2  GROUP A STREP BY PCR    EKG: None  Radiology: DG Chest 2 View Result Date: 03/25/2024 CLINICAL DATA:  Productive cough.  History of multiple myeloma. EXAM: CHEST - 2 VIEW COMPARISON:  10/25/2023 FINDINGS: The heart size and mediastinal contours are within normal limits. Interval removal of tunneled central venous catheter. There is no evidence of pulmonary edema, consolidation, pneumothorax, nodule or pleural fluid. The visualized skeletal structures are unremarkable. IMPRESSION: No active cardiopulmonary disease. Electronically Signed   By: Marcey Moan M.D.   On: 03/25/2024 15:01     Procedures   Medications Ordered in the ED - No data to display                                  Medical Decision Making Amount and/or Complexity of Data Reviewed Radiology: ordered.     Differential diagnosis includes but is not limited to COVID, flu, RSV, viral URI, strep pharyngitis, viral pharyngitis, allergic rhinitis, pneumonia, bronchitis   ED Course:  Upon initial evaluation, patient is well-appearing, no acute distress.  Normal vital signs.  Lungs are auscultation bilaterally.  Posterior oropharynx without any significant erythema, edema, and tonsillar exudate.  Low suspicion for strep pharyngitis.  No signs of peritonsillar abscess or other emergent pathology.  Labs Ordered: I Ordered, and personally interpreted labs.  The pertinent results include:   COVID, flu, RSV negative.  Strep negative  Imaging Studies ordered: I ordered imaging studies including chest x-ray I independently visualized the imaging with scope of interpretation limited to determining acute life threatening conditions  related to emergency care. Imaging showed no acute abnormalities I agree with the radiologist interpretation   Medications Given: None  Upon re-evaluation, patient remains well-appearing with stable vitals.  Her COVID, flu, and RSV testing is negative.  Strep testing negative.  Chest x-ray without evidence of pneumonia.  Given her symptoms of cough, congestion, suspect other viral URI as the cause of her symptoms.  She is tolerating p.o. intake.  Normal vitals.  Feel she is stable and appropriate for discharge home.     Impression: Viral URI  Disposition:  Discharged home with instructions to use over-the-counter medications as needed for symptom control.  Follow-up with PCP if symptoms not improving within  the next 5 days. Return precautions given and patient verbalized understanding.    This chart was dictated using voice recognition software, Dragon. Despite the best efforts of this provider to proofread and correct errors, errors may still occur which can change documentation meaning.       Final diagnoses:  Viral URI with cough    ED Discharge Orders     None          Veta Palma, DEVONNA 03/25/24 1909    Armenta Canning, MD 03/26/24 TRENNA

## 2024-03-25 NOTE — Discharge Instructions (Signed)
 You appear to have an upper respiratory infection (URI). An upper respiratory tract infection, or cold, is a viral infection of the air passages leading to the lungs. It should improve gradually after 5-7 days. You may have a lingering cough that lasts for 2- 4 weeks after the infection.  Your illness is contagious and can be spread to others. It cannot be cured by antibiotics or other medicines. Take basic precautions such as washing your hands often, covering your mouth when you cough or sneeze, and avoiding public places where you could spread your illness to others.   Your flu, covid, and RSV test were negative today.  Your strep test was negative.  Chest x-ray did not show any pneumonia  Home care instructions:  You can take Tylenol  and/or Ibuprofen as directed on the packaging for fever reduction and pain relief.    For cough: honey 1/2 to 1 teaspoon (you can dilute the honey in water or another fluid).  You can also use guaifenesin and dextromethorphan for cough which are over-the-counter medications. You can use a humidifier for chest congestion and cough.  If you don't have a humidifier, you can sit in the bathroom with the hot shower running.      For sore throat: try warm salt water gargles, cepacol lozenges, throat spray, warm tea or water with lemon/honey, popsicles or ice, or OTC cold relief medicine for throat discomfort.    For congestion: Flonase (Fluticasone) 1-2 sprays in each nostril daily. This is an over the counter medication.    It is important to stay hydrated: drink plenty of fluids (water, gatorade/powerade/pedialyte, juices, or teas) to keep your throat moisturized and help further relieve irritation/discomfort.   Follow-up instructions: Please follow-up with your primary care provider for further evaluation of your symptoms if you are not feeling better within the next 5 days.   Return instructions:  Please return to the Emergency Department if you experience  worsening symptoms.  RETURN IMMEDIATELY IF you develop shortness of breath, confusion or altered mental status, a new rash, become dizzy, faint, or poorly responsive, or are unable to be cared for at home. Please return if you have persistent vomiting and cannot keep down fluids or develop a fever that is not controlled by tylenol  or motrin.   Please return if you have any other emergent concerns.

## 2024-03-25 NOTE — ED Triage Notes (Signed)
 Pt presents c/o sore throat, congestion, and productive cough x2 days. Reports coughing up brown, thick mucous. Endorses sick contacts.

## 2024-03-31 ENCOUNTER — Telehealth: Payer: Self-pay | Admitting: Internal Medicine

## 2024-03-31 NOTE — Telephone Encounter (Signed)
 Rescheduled IVIG appointment. Called and spoke with the patient, she is aware of the day and time.

## 2024-04-12 ENCOUNTER — Inpatient Hospital Stay: Attending: Internal Medicine

## 2024-04-12 VITALS — BP 109/76 | HR 58 | Temp 97.8°F | Resp 16 | Wt 180.5 lb

## 2024-04-12 DIAGNOSIS — C9000 Multiple myeloma not having achieved remission: Secondary | ICD-10-CM | POA: Diagnosis present

## 2024-04-12 DIAGNOSIS — Z79899 Other long term (current) drug therapy: Secondary | ICD-10-CM | POA: Insufficient documentation

## 2024-04-12 MED ORDER — DIPHENHYDRAMINE HCL 25 MG PO CAPS
25.0000 mg | ORAL_CAPSULE | Freq: Once | ORAL | Status: AC
  Administered 2024-04-12: 08:00:00 25 mg via ORAL
  Filled 2024-04-12: qty 1

## 2024-04-12 MED ORDER — ACETAMINOPHEN 325 MG PO TABS
650.0000 mg | ORAL_TABLET | Freq: Once | ORAL | Status: AC
  Administered 2024-04-12: 08:00:00 650 mg via ORAL
  Filled 2024-04-12: qty 2

## 2024-04-12 MED ORDER — IMMUNE GLOBULIN (HUMAN) 10 GM/100ML IV SOLN
30.0000 g | Freq: Once | INTRAVENOUS | Status: AC
  Administered 2024-04-12: 09:00:00 30 g via INTRAVENOUS
  Filled 2024-04-12: qty 100

## 2024-04-12 MED ORDER — DEXTROSE 5 % IV SOLN: INTRAVENOUS | Status: DC

## 2024-04-12 NOTE — Patient Instructions (Signed)
 Immune Globulin  Injection What is this medication? IMMUNE GLOBULIN  (im MUNE GLOB yoo lin) treats many immune system conditions. It works by Designer, multimedia extra antibodies. Antibodies are proteins made by the immune system that help protect the body. This medicine may be used for other purposes; ask your health care provider or pharmacist if you have questions. COMMON BRAND NAME(S): ASCENIV, Baygam, BIVIGAM, Carimune, Carimune NF, cutaquig, Cuvitru, Flebogamma, Flebogamma DIF, GamaSTAN, GamaSTAN S/D, Gamimune N, Gammagard, Gammagard S/D, Gammaked, Gammaplex, Gammar-P IV, Gamunex, Gamunex-C, Hizentra, Iveegam, Iveegam EN, Octagam, Panglobulin, Panglobulin NF, panzyga, Polygam S/D, Privigen , Sandoglobulin, Venoglobulin-S, Vigam, Vivaglobulin, Xembify What should I tell my care team before I take this medication? They need to know if you have any of these conditions: Blood clotting disorder Condition where you have excess fluid in your body, such as heart failure or edema Dehydration Diabetes Have had blood clots Heart disease Immune system conditions Kidney disease Low levels of IgA Recent or upcoming vaccine An unusual or allergic reaction to immune globulin , other medications, foods, dyes, or preservatives Pregnant or trying to get pregnant Breastfeeding How should I use this medication? This medication is infused into a vein or under the skin. It may also be injected into a muscle. It is usually given by your care team in a hospital or clinic setting. It may also be given at home. If you get this medication at home, you will be taught how to prepare and give it. Take it as directed on the prescription label. Keep taking it unless your care team tells you to stop. It is important that you put your used needles and syringes in a special sharps container. Do not put them in a trash can. If you do not have a sharps container, call your pharmacist or care team to get one. Talk to your care team  about the use of this medication in children. While it may be given to children for selected conditions, precautions do apply. Overdosage: If you think you have taken too much of this medicine contact a poison control center or emergency room at once. NOTE: This medicine is only for you. Do not share this medicine with others. What if I miss a dose? If you get this medication at the hospital or clinic: It is important not to miss your dose. Call your care team if you are unable to keep an appointment. If you give yourself this medication at home: If you miss a dose, take it as soon as you can. Then continue your normal schedule. If it is almost time for your next dose, take only that dose. Do not take double or extra doses. Call your care team with questions. What may interact with this medication? Live virus vaccines This list may not describe all possible interactions. Give your health care provider a list of all the medicines, herbs, non-prescription drugs, or dietary supplements you use. Also tell them if you smoke, drink alcohol , or use illegal drugs. Some items may interact with your medicine. What should I watch for while using this medication? Your condition will be monitored carefully while you are receiving this medication. Tell your care team if your symptoms do not start to get better or if they get worse. You may need blood work done while you are taking this medication. This medication increases the risk of blood clots. People with heart, blood vessel, or blood clotting conditions are more likely to develop a blood clot. Other risk factors include advanced age, estrogen use, tobacco  use, lack of movement, and being overweight. This medication can decrease the response to a vaccine. If you need to get vaccinated, tell your care team if you have received this medication within the last year. Extra booster doses may be needed. Talk to your care team to see if a different vaccination schedule  is needed. This medication is made from donated human blood. There is a small risk it may contain bacteria or viruses, such as hepatitis or HIV. All products are processed to kill most bacteria and viruses. Talk to your care team if you have questions about the risk of infection. If you have diabetes, talk to your care team about which device you should use to check your blood sugar. This medication may cause some devices to report falsely high blood sugar levels. This may cause you to react by not treating a low blood sugar level or by giving an insulin  dose that was not needed. This can cause severe low blood sugar levels. What side effects may I notice from receiving this medication? Side effects that you should report to your care team as soon as possible: Allergic reactions--skin rash, itching, hives, swelling of the face, lips, tongue, or throat Blood clot--pain, swelling, or warmth in the leg, shortness of breath, chest pain Fever, neck pain or stiffness, sensitivity to light, headache, nausea, vomiting, confusion, which may be signs of meningitis Hemolytic anemia--unusual weakness or fatigue, dizziness, headache, trouble breathing, dark urine, yellowing skin or eyes Kidney injury--decrease in the amount of urine, swelling of the ankles, hands, or feet Low sodium level--muscle weakness, fatigue, dizziness, headache, confusion Shortness of breath or trouble breathing, cough, unusual weakness or fatigue, blue skin or lips Side effects that usually do not require medical attention (report these to your care team if they continue or are bothersome): Chills Diarrhea Fever Headache Nausea This list may not describe all possible side effects. Call your doctor for medical advice about side effects. You may report side effects to FDA at 1-800-FDA-1088. Where should I keep my medication? Keep out of the reach of children and pets. You will be instructed on how to store this medication. Get rid of  any unused medication after the expiration date. To get rid of medications that are no longer needed or have expired: Take the medication to a medication take-back program. Check with your pharmacy or law enforcement to find a location. If you cannot return the medication, ask your pharmacist or care team how to get rid of this medication safely. NOTE: This sheet is a summary. It may not cover all possible information. If you have questions about this medicine, talk to your doctor, pharmacist, or health care provider.  2025 Elsevier/Gold Standard (2023-06-28 00:00:00)

## 2024-04-17 ENCOUNTER — Telehealth: Payer: Self-pay

## 2024-04-17 ENCOUNTER — Other Ambulatory Visit: Payer: Self-pay | Admitting: Internal Medicine

## 2024-04-17 ENCOUNTER — Telehealth: Payer: Self-pay | Admitting: Medical Oncology

## 2024-04-17 DIAGNOSIS — G893 Neoplasm related pain (acute) (chronic): Secondary | ICD-10-CM

## 2024-04-17 DIAGNOSIS — C9000 Multiple myeloma not having achieved remission: Secondary | ICD-10-CM

## 2024-04-17 MED ORDER — HYDROCODONE-ACETAMINOPHEN 5-325 MG PO TABS: 1.0000 | ORAL_TABLET | Freq: Four times a day (QID) | ORAL | 0 refills | Status: DC | PRN

## 2024-04-17 NOTE — Telephone Encounter (Signed)
Pt called requesting Vicodin refill.

## 2024-04-17 NOTE — Telephone Encounter (Signed)
 Pt requested refill for Hydrocodone .

## 2024-04-18 ENCOUNTER — Other Ambulatory Visit: Payer: Self-pay | Admitting: Medical Oncology

## 2024-04-18 DIAGNOSIS — C9000 Multiple myeloma not having achieved remission: Secondary | ICD-10-CM

## 2024-04-23 ENCOUNTER — Encounter (HOSPITAL_BASED_OUTPATIENT_CLINIC_OR_DEPARTMENT_OTHER): Payer: Self-pay | Admitting: Emergency Medicine

## 2024-04-23 DIAGNOSIS — Z79899 Other long term (current) drug therapy: Secondary | ICD-10-CM | POA: Diagnosis not present

## 2024-04-23 DIAGNOSIS — Z7982 Long term (current) use of aspirin: Secondary | ICD-10-CM | POA: Insufficient documentation

## 2024-04-23 DIAGNOSIS — R509 Fever, unspecified: Secondary | ICD-10-CM | POA: Diagnosis present

## 2024-04-23 DIAGNOSIS — J101 Influenza due to other identified influenza virus with other respiratory manifestations: Secondary | ICD-10-CM | POA: Diagnosis not present

## 2024-04-23 DIAGNOSIS — Z7901 Long term (current) use of anticoagulants: Secondary | ICD-10-CM | POA: Insufficient documentation

## 2024-04-23 LAB — RESP PANEL BY RT-PCR (RSV, FLU A&B, COVID)  RVPGX2
Influenza A by PCR: NEGATIVE
Influenza B by PCR: POSITIVE — AB
Resp Syncytial Virus by PCR: NEGATIVE
SARS Coronavirus 2 by RT PCR: NEGATIVE

## 2024-04-23 MED ORDER — IBUPROFEN 800 MG PO TABS
800.0000 mg | ORAL_TABLET | Freq: Once | ORAL | Status: AC
  Administered 2024-04-24: 800 mg via ORAL

## 2024-04-23 NOTE — ED Triage Notes (Signed)
 Cough cold congestion fever max 101 headache Grandaughter + flu Started 2 days ago Taking mucinex

## 2024-04-24 ENCOUNTER — Emergency Department (HOSPITAL_BASED_OUTPATIENT_CLINIC_OR_DEPARTMENT_OTHER)
Admission: EM | Admit: 2024-04-24 | Discharge: 2024-04-24 | Disposition: A | Attending: Emergency Medicine | Admitting: Emergency Medicine

## 2024-04-24 ENCOUNTER — Telehealth: Payer: Self-pay

## 2024-04-24 DIAGNOSIS — J101 Influenza due to other identified influenza virus with other respiratory manifestations: Secondary | ICD-10-CM

## 2024-04-24 LAB — CBC WITH DIFFERENTIAL/PLATELET
Abs Immature Granulocytes: 0.01 K/uL (ref 0.00–0.07)
Basophils Absolute: 0 K/uL (ref 0.0–0.1)
Basophils Relative: 1 %
Eosinophils Absolute: 0 K/uL (ref 0.0–0.5)
Eosinophils Relative: 0 %
HCT: 32.1 % — ABNORMAL LOW (ref 36.0–46.0)
Hemoglobin: 10.6 g/dL — ABNORMAL LOW (ref 12.0–15.0)
Immature Granulocytes: 1 %
Lymphocytes Relative: 33 %
Lymphs Abs: 0.7 K/uL (ref 0.7–4.0)
MCH: 31.2 pg (ref 26.0–34.0)
MCHC: 33 g/dL (ref 30.0–36.0)
MCV: 94.4 fL (ref 80.0–100.0)
Monocytes Absolute: 0.6 K/uL (ref 0.1–1.0)
Monocytes Relative: 29 %
Neutro Abs: 0.8 K/uL — ABNORMAL LOW (ref 1.7–7.7)
Neutrophils Relative %: 36 %
Platelets: 142 K/uL — ABNORMAL LOW (ref 150–400)
RBC: 3.4 MIL/uL — ABNORMAL LOW (ref 3.87–5.11)
RDW: 13.6 % (ref 11.5–15.5)
WBC: 2.1 K/uL — ABNORMAL LOW (ref 4.0–10.5)
nRBC: 0 % (ref 0.0–0.2)

## 2024-04-24 LAB — BASIC METABOLIC PANEL WITH GFR
Anion gap: 12 (ref 5–15)
BUN: 21 mg/dL (ref 8–23)
CO2: 24 mmol/L (ref 22–32)
Calcium: 8.9 mg/dL (ref 8.9–10.3)
Chloride: 102 mmol/L (ref 98–111)
Creatinine, Ser: 1.13 mg/dL — ABNORMAL HIGH (ref 0.44–1.00)
GFR, Estimated: 51 mL/min — ABNORMAL LOW
Glucose, Bld: 151 mg/dL — ABNORMAL HIGH (ref 70–99)
Potassium: 4 mmol/L (ref 3.5–5.1)
Sodium: 139 mmol/L (ref 135–145)

## 2024-04-24 MED ORDER — OSELTAMIVIR PHOSPHATE 30 MG PO CAPS
30.0000 mg | ORAL_CAPSULE | Freq: Once | ORAL | Status: AC
  Administered 2024-04-24: 30 mg via ORAL
  Filled 2024-04-24: qty 1

## 2024-04-24 MED ORDER — OSELTAMIVIR PHOSPHATE 30 MG PO CAPS: 30.0000 mg | ORAL_CAPSULE | Freq: Two times a day (BID) | ORAL | 0 refills | Status: AC

## 2024-04-24 MED ORDER — OSELTAMIVIR PHOSPHATE 30 MG PO CAPS: 30.0000 mg | ORAL_CAPSULE | Freq: Two times a day (BID) | ORAL | 0 refills | Status: DC

## 2024-04-24 NOTE — Telephone Encounter (Signed)
 Pt called to report that she was in ED today and positive for flu.  Prescribed Tamiflu .

## 2024-04-24 NOTE — ED Provider Notes (Signed)
 " [place] EMERGENCY DEPARTMENT AT [facility] Provider Note   CSN: [id number] Arrival date & time: 04/23/24  8167     Patient presents with: Fever   Joanna Ray is a 73 y.o. female.   Symptoms since Friday. Congestion, cough, fevers up to 101. Granddaughter sick with flu. No GI symptoms. No vaccine 2/2 recent myeloma treatment.    Fever      Prior to Admission medications  Medication Sig Start Date End Date Taking? Authorizing Provider  ALPRAZolam  (XANAX ) 0.5 MG tablet Take 1 tablet by mouth twice daily as needed for anxiety 11/03/23   [name], MD  amLODipine  (NORVASC ) 5 MG tablet Take 1 tablet by mouth once daily 11/23/23   [name], MD  apixaban  (ELIQUIS ) 2.5 MG TABS tablet Take 1 tablet (2.5 mg total) by mouth 2 (two) times daily. 06/08/23   Heilingoetter, Cassandra L, PA-C  aspirin  81 MG tablet Take 81 mg by mouth daily.    [provider]  cholecalciferol  (VITAMIN D ) 1000 UNITS tablet Take 1,000 Units by mouth daily.    [provider]  cyclobenzaprine  (FLEXERIL ) 10 MG tablet Take 1 tablet by mouth three times daily as needed for muscle spasm 05/20/23   [name], MD  EPINEPHrine  (EPIPEN  2-PAK) 0.3 mg/0.3 mL IJ SOAJ injection Inject 0.3 mg into the muscle as needed for anaphylaxis. 07/14/22   [name], MD  ergocalciferol  (DRISDOL ) 1.25 MG (50000 UT) capsule Take 1 capsule (50,000 Units total) by mouth once a week. 10/15/23   [name], MD  fexofenadine  (ALLEGRA ) 180 MG tablet Take 1 tablet (180 mg total) by mouth daily. 06/29/19   Jeneal Danita Macintosh, MD  furosemide  (LASIX ) 20 MG tablet TAKE 1 TABLET BY MOUTH ONCE DAILY FOR LEG SWELLING 02/02/24   [name], MD  gabapentin  (NEURONTIN ) 600 MG tablet Take 1 tablet (600 mg total) by mouth 3 (three) times daily. 03/02/24   [name], MD  HYDROcodone -acetaminophen  (NORCO/VICODIN) 5-325 MG tablet Take 1 tablet by mouth every 6 (six) hours as needed for moderate  pain (pain score 4-6). 04/17/24   Sherrod Sherrod, MD  hydrocortisone  valerate cream (WESTCORT ) 0.2 % 1 app    [provider]  olmesartan  (BENICAR ) 20 MG tablet Take 1 tablet by mouth once daily 01/18/24   [name], MD  oseltamivir  (TAMIFLU ) 30 MG capsule Take 1 capsule (30 mg total) by mouth 2 (two) times daily for 5 days. 04/24/24 04/29/24  Prashant Glosser, Selinda, MD  potassium chloride  SA (KLOR-CON  M) 20 MEQ tablet Take 1 tablet (20 mEq total) by mouth 2 (two) times daily. 10/18/23   [name], MD  rosuvastatin  (CRESTOR ) 20 MG tablet TAKE 1/2 TABLET ONE TIME DAILY 12/13/23   [name], MD  vitamin E  100 UNIT capsule Take 100 Units by mouth daily.    [provider]    Allergies: Corn-containing products and Shellfish allergy    Review of Systems  Constitutional:  Positive for fever.    Updated Vital Signs BP 91/64   Pulse 60   Temp 98.4 F (36.9 C) (Oral)   Resp 18   SpO2 98%   Physical Exam Vitals and nursing note reviewed.  Constitutional:      Appearance: She is well-developed.  HENT:     Head: Normocephalic and atraumatic.  Cardiovascular:     Rate and Rhythm: Normal rate and regular rhythm.  Pulmonary:     Effort: No respiratory  distress.     Breath sounds: No stridor.  Abdominal:     General: There is no distension.  Musculoskeletal:     Cervical back: Normal range of motion.  Skin:    General: Skin is warm and dry.  Neurological:     Mental Status: She is alert.     (all labs ordered are listed, but only abnormal results are displayed) Labs Reviewed  RESP PANEL BY RT-PCR (RSV, FLU A&B, COVID)  RVPGX2 - Abnormal; Notable for the following components:      Result Value   Influenza B by PCR POSITIVE (*)    All other components within normal limits  CBC WITH DIFFERENTIAL/PLATELET - Abnormal; Notable for the following components:   WBC 2.1 (*)    RBC 3.40 (*)    Hemoglobin 10.6 (*)    HCT 32.1 (*)    Platelets 142 (*)    Neutro Abs  0.8 (*)    All other components within normal limits  BASIC METABOLIC PANEL WITH GFR - Abnormal; Notable for the following components:   Glucose, Bld 151 (*)    Creatinine, Ser 1.13 (*)    GFR, Estimated 51 (*)    All other components within normal limits    EKG: None  Radiology: No results found.   Procedures   Medications Ordered in the ED  ibuprofen  (ADVIL ) tablet 800 mg (800 mg Oral Given 04/24/24 0013)  oseltamivir  (TAMIFLU ) capsule 30 mg (30 mg Oral Given 04/24/24 0344)                                    Medical Decision Making Amount and/or Complexity of Data Reviewed Labs: ordered.  Risk Prescription drug management.   Labs checked secondary to cancer and chemo history.  Tamiflu  started for same reason as she is immune compromised even though it is technically outside the 48-hour window I discussed with pharmacy and we both agree that it probably made sense in her situation.  Did 30 twice a day secondary to kidney function.  Overall patient appears well with normal vital and is no respiratory distress.  Will follow with PCP if not improving here if things worsen.   Final diagnoses:  Influenza B    ED Discharge Orders          Ordered    oseltamivir  (TAMIFLU ) 30 MG capsule  2 times daily,   Status:  Discontinued        04/24/24 0339    oseltamivir  (TAMIFLU ) 30 MG capsule  2 times daily        04/24/24 0350               Shina Wass, Selinda, MD 04/24/24 [phone]  "

## 2024-04-26 ENCOUNTER — Encounter: Payer: Self-pay | Admitting: Internal Medicine

## 2024-04-26 ENCOUNTER — Telehealth: Payer: Self-pay

## 2024-04-26 NOTE — Telephone Encounter (Signed)
 Oral Oncology Patient Advocate Encounter  Was successful in securing patient a $8000 grant from [facility] to provide copayment coverage for Revlimid .  This will keep the out of pocket expense at $0.     [employer] ID: [id number]   The billing information is as follows and has been shared with [employer].    RxBin: [id number] PCN: [id number] Member ID: [id number] Group ID: [id number] Dates of Eligibility: 04/19/24 through 04/18/25  Fund:  MM  Lucie Lamer, CPhT [place]  [facility] On[facility] Pharmacy Patient Advocate Specialist [address] [place] Phone: [phone]  Fax: [phone] [email or web address]

## 2024-04-28 ENCOUNTER — Telehealth: Admitting: Physician Assistant

## 2024-04-28 ENCOUNTER — Ambulatory Visit: Payer: Self-pay

## 2024-04-28 DIAGNOSIS — J208 Acute bronchitis due to other specified organisms: Secondary | ICD-10-CM

## 2024-04-28 DIAGNOSIS — B9689 Other specified bacterial agents as the cause of diseases classified elsewhere: Secondary | ICD-10-CM | POA: Diagnosis not present

## 2024-04-28 DIAGNOSIS — J111 Influenza due to unidentified influenza virus with other respiratory manifestations: Secondary | ICD-10-CM | POA: Diagnosis not present

## 2024-04-28 DIAGNOSIS — B999 Unspecified infectious disease: Secondary | ICD-10-CM

## 2024-04-28 MED ORDER — GUAIFENESIN-DM 100-10 MG/5ML PO SYRP: 5.0000 mL | ORAL_SOLUTION | ORAL | 0 refills | Status: AC | PRN

## 2024-04-28 MED ORDER — BENZONATATE 100 MG PO CAPS: 100.0000 mg | ORAL_CAPSULE | Freq: Three times a day (TID) | ORAL | 0 refills | Status: AC | PRN

## 2024-04-28 MED ORDER — AZITHROMYCIN 250 MG PO TABS: ORAL_TABLET | ORAL | 0 refills | Status: AC

## 2024-04-28 NOTE — Patient Instructions (Signed)
 " Joanna Ray, thank you for joining Delon CHRISTELLA Dickinson, PA-C for today's virtual visit.  While this provider is not your primary care provider (PCP), if your PCP is located in our provider database this encounter information will be shared with them immediately following your visit.   A [facility] MyChart account gives you access to today's visit and all your visits, tests, and labs performed at [facility]  click here if you don't have a [facility] MyChart account or go to [email or web address]  Consent: (Patient) Joanna Ray provided verbal consent for this virtual visit at the beginning of the encounter.  Current Medications:  Current Outpatient Medications:    azithromycin  (ZITHROMAX ) 250 MG tablet, Take 2 tablets on day 1, then 1 tablet daily on days 2 through 5, Disp: 6 tablet, Rfl: 0   benzonatate (TESSALON) 100 MG capsule, Take 1-2 capsules (100-200 mg total) by mouth 3 (three) times daily as needed., Disp: 30 capsule, Rfl: 0   guaiFENesin-dextromethorphan (ROBITUSSIN DM) 100-10 MG/5ML syrup, Take 5 mLs by mouth every 4 (four) hours as needed for cough., Disp: 118 mL, Rfl: 0   ALPRAZolam  (XANAX ) 0.5 MG tablet, Take 1 tablet by mouth twice daily as needed for anxiety, Disp: 60 tablet, Rfl: 5   amLODipine  (NORVASC ) 5 MG tablet, Take 1 tablet by mouth once daily, Disp: 30 tablet, Rfl: 6   apixaban  (ELIQUIS ) 2.5 MG TABS tablet, Take 1 tablet (2.5 mg total) by mouth 2 (two) times daily., Disp: 60 tablet, Rfl: 2   aspirin  81 MG tablet, Take 81 mg by mouth daily., Disp: , Rfl:    cholecalciferol  (VITAMIN D ) 1000 UNITS tablet, Take 1,000 Units by mouth daily., Disp: , Rfl:    cyclobenzaprine  (FLEXERIL ) 10 MG tablet, Take 1 tablet by mouth three times daily as needed for muscle spasm, Disp: 90 tablet, Rfl: 0   EPINEPHrine  (EPIPEN  2-PAK) 0.3 mg/0.3 mL IJ SOAJ injection, Inject 0.3 mg into the muscle as needed for anaphylaxis., Disp: 1 each, Rfl: PRN    ergocalciferol  (DRISDOL ) 1.25 MG (50000 UT) capsule, Take 1 capsule (50,000 Units total) by mouth once a week., Disp: 12 capsule, Rfl: 3   fexofenadine  (ALLEGRA ) 180 MG tablet, Take 1 tablet (180 mg total) by mouth daily., Disp: 90 tablet, Rfl: 1   furosemide  (LASIX ) 20 MG tablet, TAKE 1 TABLET BY MOUTH ONCE DAILY FOR LEG SWELLING, Disp: 90 tablet, Rfl: 1   gabapentin  (NEURONTIN ) 600 MG tablet, Take 1 tablet (600 mg total) by mouth 3 (three) times daily., Disp: 90 tablet, Rfl: 1   HYDROcodone -acetaminophen  (NORCO/VICODIN) 5-325 MG tablet, Take 1 tablet by mouth every 6 (six) hours as needed for moderate pain (pain score 4-6)., Disp: 30 tablet, Rfl: 0   hydrocortisone  valerate cream (WESTCORT ) 0.2 %, 1 app, Disp: , Rfl:    olmesartan  (BENICAR ) 20 MG tablet, Take 1 tablet by mouth once daily, Disp: 90 tablet, Rfl: 3   oseltamivir  (TAMIFLU ) 30 MG capsule, Take 1 capsule (30 mg total) by mouth 2 (two) times daily for 5 days., Disp: 10 capsule, Rfl: 0   potassium chloride  SA (KLOR-CON  M) 20 MEQ tablet, Take 1 tablet (20 mEq total) by mouth 2 (two) times daily., Disp: 60 tablet, Rfl: 2   rosuvastatin  (CRESTOR ) 20 MG tablet, TAKE 1/2 TABLET ONE TIME DAILY, Disp: 45 tablet, Rfl: 3   vitamin E  100 UNIT capsule, Take 100 Units by mouth daily., Disp: , Rfl:    Medications ordered in this encounter:  Meds ordered  this encounter  Medications   azithromycin  (ZITHROMAX ) 250 MG tablet    Sig: Take 2 tablets on day 1, then 1 tablet daily on days 2 through 5    Dispense:  6 tablet    Refill:  0    Supervising Provider:   LAMPTEY, PHILIP O [[id number]]   benzonatate (TESSALON) 100 MG capsule    Sig: Take 1-2 capsules (100-200 mg total) by mouth 3 (three) times daily as needed.    Dispense:  30 capsule    Refill:  0    Supervising Provider:   LAMPTEY, PHILIP O [[id number]]   guaiFENesin-dextromethorphan (ROBITUSSIN DM) 100-10 MG/5ML syrup    Sig: Take 5 mLs by mouth every 4 (four) hours as needed for cough.     Dispense:  118 mL    Refill:  0    Supervising Provider:   BLAISE ALEENE KIDD [[id number]]     *If you need refills on other medications prior to your next appointment, please contact your pharmacy*  Follow-Up: Call back or seek an in-person evaluation if the symptoms worsen or if the condition fails to improve as anticipated.  [facility] [phone]  Other Instructions Acute Bronchitis, Adult  Acute bronchitis is sudden inflammation of the main airways (bronchi) that come off the windpipe (trachea) in the lungs. The swelling causes the airways to get smaller and make more mucus than normal. This can make it hard to breathe and can cause coughing or noisy breathing (wheezing). Acute bronchitis may last several weeks. The cough may last longer. Allergies, asthma, and exposure to smoke may make the condition worse. What are the causes? This condition can be caused by germs and by substances that irritate the lungs, including: Cold and flu viruses. The most common cause of this condition is the virus that causes the common cold. Bacteria. This is less common. Breathing in substances that irritate the lungs, including: Smoke from cigarettes and other forms of tobacco. Dust and pollen. Fumes from household cleaning products, gases, or burned fuel. Indoor or outdoor air pollution. What increases the risk? The following factors may make you more likely to develop this condition: A weak body's defense system, also called the immune system. A condition that affects your lungs and breathing, such as asthma. What are the signs or symptoms? Common symptoms of this condition include: Coughing. This may bring up clear, yellow, or green mucus from your lungs (sputum). Wheezing. Runny or stuffy nose. Having too much mucus in your lungs (chest congestion). Shortness of breath. Aches and pains, including sore throat or chest. How is this diagnosed? This condition is usually  diagnosed based on: Your symptoms and medical history. A physical exam. You may also have other tests, including tests to rule out other conditions, such as pneumonia. These tests include: A test of lung function. Test of a mucus sample to look for the presence of bacteria. Tests to check the oxygen level in your blood. Blood tests. Chest X-ray. How is this treated? Most cases of acute bronchitis clear up over time without treatment. Your health care provider may recommend: Drinking more fluids to help thin your mucus so it is easier to cough up. Taking inhaled medicine (inhaler) to improve air flow in and out of your lungs. Using a vaporizer or a humidifier. These are machines that add water to the air to help you breathe better. Taking a medicine that thins mucus and clears congestion (expectorant). Taking a medicine that prevents or stops coughing (  cough suppressant). It is not common to take an antibiotic medicine for this condition. Follow these instructions at home:  Take over-the-counter and prescription medicines only as told by your health care provider. Use an inhaler, vaporizer, or humidifier as told by your health care provider. Take two teaspoons (10 mL) of honey at bedtime to lessen coughing at night. Drink enough fluid to keep your urine pale yellow. Do not use any products that contain nicotine or tobacco. These products include cigarettes, chewing tobacco, and vaping devices, such as e-cigarettes. If you need help quitting, ask your health care provider. Get plenty of rest. Return to your normal activities as told by your health care provider. Ask your health care provider what activities are safe for you. Keep all follow-up visits. This is important. How is this prevented? To lower your risk of getting this condition again: Wash your hands often with soap and water for at least 20 seconds. If soap and water are not available, use hand sanitizer. Avoid contact with  people who have cold symptoms. Try not to touch your mouth, nose, or eyes with your hands. Avoid breathing in smoke or chemical fumes. Breathing smoke or chemical fumes will make your condition worse. Get the flu shot every year. Contact a health care provider if: Your symptoms do not improve after 2 weeks. You have trouble coughing up the mucus. Your cough keeps you awake at night. You have a fever. Get help right away if you: Cough up blood. Feel pain in your chest. Have severe shortness of breath. Faint or keep feeling like you are going to faint. Have a severe headache. Have a fever or chills that get worse. These symptoms may represent a serious problem that is an emergency. Do not wait to see if the symptoms will go away. Get medical help right away. Call your local emergency services (911 in the [place] Do not drive yourself to the hospital. Summary Acute bronchitis is inflammation of the main airways (bronchi) that come off the windpipe (trachea) in the lungs. The swelling causes the airways to get smaller and make more mucus than normal. Drinking more fluids can help thin your mucus so it is easier to cough up. Take over-the-counter and prescription medicines only as told by your health care provider. Do not use any products that contain nicotine or tobacco. These products include cigarettes, chewing tobacco, and vaping devices, such as e-cigarettes. If you need help quitting, ask your health care provider. Contact a health care provider if your symptoms do not improve after 2 weeks. This information is not intended to replace advice given to you by your health care provider. Make sure you discuss any questions you have with your health care provider. Document Revised: 07/24/2021 Document Reviewed: 08/14/2020 Elsevier Patient Education  2024 [employer].   If you have been instructed to have an in-person evaluation today at a local Urgent Care facility, please use the link  below. It will take you to a list of all of our available [facility], including address, phone number and hours of operation. Please do not delay care.  [facility]  If you or a family member do not have a primary care provider, use the link below to schedule a visit and establish care. When you choose a [facility] primary care physician or advanced practice provider, you gain a long-term partner in health. Find a Primary Care Provider  Learn more about 's in-office and virtual care options:  [facility] Get [facility] Now "

## 2024-04-28 NOTE — Telephone Encounter (Signed)
 FYI Only or Action Required?: Action required by provider: update on patient condition. Virtual UC scheduled  Patient was last seen in primary care on 03/02/2024 by [name], MD.  Called Nurse Triage reporting Cough.  Symptoms began a week ago.  Interventions attempted: Prescription medications: tamiflu .  Symptoms are: gradually worsening.  Triage Disposition: Home Care  Patient/caregiver understands and will follow disposition?: No, wishes to speak with PCP   Copied from CRM #[id number]. Topic: Clinical - Red Word Triage >> Apr 28, 2024 10:12 AM Alfonso ORN wrote: Red Word that prompted transfer to Nurse Triage: worsening and extreme congestion since ER 12/28, unable to cough up Reason for Disposition  Cough  Answer Assessment - Initial Assessment Questions 1. ONSET: When did the cough begin?      Three day days ago 2. SEVERITY: How bad is the cough today?      Nagging, cannot cough up any phlegm that she feels 3. SPUTUM: Describe the color of your sputum (e.g., none, dry cough; clear, white, yellow, green)     Unable to cough anything up 4. HEMOPTYSIS: Are you coughing up any blood? If Yes, ask: How much? (e.g., flecks, streaks, tablespoons, etc.)     denies 5. DIFFICULTY BREATHING: Are you having difficulty breathing? If Yes, ask: How bad is it? (e.g., mild, moderate, severe)      denies 6. FEVER: Do you have a fever? If Yes, ask: What is your temperature, how was it measured, and when did it start?     denies 7. CARDIAC HISTORY: Do you have any history of heart disease? (e.g., heart attack, congestive heart failure)      HTN 8. LUNG HISTORY: Do you have any history of lung disease?  (e.g., pulmonary embolus, asthma, emphysema)     denies  10. OTHER SYMPTOMS: Do you have any other symptoms? (e.g., runny nose, wheezing, chest pain)       denies  Protocols used: Cough - Acute Non-Productive-A-AH

## 2024-04-28 NOTE — Progress Notes (Signed)
 " Virtual Visit Consent   Joanna Ray, you are scheduled for a virtual visit with a [facility] provider today. Just as with appointments in the office, your consent must be obtained to participate. Your consent will be active for this visit and any virtual visit you may have with one of our providers in the next 365 days. If you have a MyChart account, a copy of this consent can be sent to you electronically.  As this is a virtual visit, video technology does not allow for your provider to perform a traditional examination. This may limit your provider's ability to fully assess your condition. If your provider identifies any concerns that need to be evaluated in person or the need to arrange testing (such as labs, EKG, etc.), we will make arrangements to do so. Although advances in technology are sophisticated, we cannot ensure that it will always work on either your end or our end. If the connection with a video visit is poor, the visit may have to be switched to a telephone visit. With either a video or telephone visit, we are not always able to ensure that we have a secure connection.  By engaging in this virtual visit, you consent to the provision of healthcare and authorize for your insurance to be billed (if applicable) for the services provided during this visit. Depending on your insurance coverage, you may receive a charge related to this service.  I need to obtain your verbal consent now. Are you willing to proceed with your visit today? Joanna Ray has provided verbal consent on 04/28/2024 for a virtual visit (video or telephone). Delon CHRISTELLA Dickinson, PA-C  Date: 04/28/2024 7:24 PM   Virtual Visit via Video Note   I, Delon CHRISTELLA Dickinson, connected with  Joanna Ray  ([id number], Mar 22, 1951) on 04/28/2024 at  7:15 PM EST by a video-enabled telemedicine application and verified that I am speaking with the correct person using two identifiers.  Location: Patient: Virtual  Visit Location Patient: Home Provider: Virtual Visit Location Provider: Home Office   I discussed the limitations of evaluation and management by telemedicine and the availability of in person appointments. The patient expressed understanding and agreed to proceed.    History of Present Illness: Joanna Ray is a 74 y.o. who identifies as a female who was assigned female at birth, and is being seen today for cough and congestion.  HPI: Cough This is a new problem. The current episode started 1 to 4 weeks ago (Worsening since 04/23/24; Diagnosed with the Flu and is currently on Tamiflu ). The problem has been gradually worsening. The problem occurs constantly. The cough is Non-productive. Associated symptoms include chills, headaches, myalgias, nasal congestion and postnasal drip. Pertinent negatives include no shortness of breath or wheezing. The symptoms are aggravated by lying down. Treatments tried: tamiflu .    Problems:  Patient Active Problem List   Diagnosis Date Noted   Encounter for antineoplastic chemotherapy 07/06/2023   Multiple myeloma (HCC) 05/20/2023   Status post total shoulder arthroplasty, right 01/17/2023   History of plasmacytoma 01/17/2023   Angioedema 01/21/2019   Vertigo 09/28/2016   Hyperlipidemia 09/25/2011   Hypertension     Allergies: Allergies[1] Medications: Current Medications[2]  Observations/Objective: Patient is well-developed, well-nourished in no acute distress.  Resting comfortably at home.  Head is normocephalic, atraumatic.  No labored breathing.  Speech is clear and coherent with logical content.  Patient is alert and oriented at baseline.    Assessment and Plan: 1.  Acute bacterial bronchitis (Primary) - azithromycin  (ZITHROMAX ) 250 MG tablet; Take 2 tablets on day 1, then 1 tablet daily on days 2 through 5  Dispense: 6 tablet; Refill: 0 - benzonatate (TESSALON) 100 MG capsule; Take 1-2 capsules (100-200 mg total) by mouth 3 (three)  times daily as needed.  Dispense: 30 capsule; Refill: 0 - guaiFENesin-dextromethorphan (ROBITUSSIN DM) 100-10 MG/5ML syrup; Take 5 mLs by mouth every 4 (four) hours as needed for cough.  Dispense: 118 mL; Refill: 0  2. Superimposed infection  3. Influenza  - Worsening, suspect superimposed infection with Influenza - Will treat with Z-pack and tessalon perles - Add Mucinex DM - Push fluids.  - Rest.  - Steam and humidifier can help - Seek in person evaluation if worsening or symptoms fail to improve    Follow Up Instructions: I discussed the assessment and treatment plan with the patient. The patient was provided an opportunity to ask questions and all were answered. The patient agreed with the plan and demonstrated an understanding of the instructions.  A copy of instructions were sent to the patient via MyChart unless otherwise noted below.    The patient was advised to call back or seek an in-person evaluation if the symptoms worsen or if the condition fails to improve as anticipated.    Delon CHRISTELLA Dickinson, PA-C     [1]  Allergies Allergen Reactions   Corn-Containing Products Swelling    Rash    Shellfish Allergy Swelling    Swelling of lips  [2]  Current Outpatient Medications:    azithromycin  (ZITHROMAX ) 250 MG tablet, Take 2 tablets on day 1, then 1 tablet daily on days 2 through 5, Disp: 6 tablet, Rfl: 0   benzonatate (TESSALON) 100 MG capsule, Take 1-2 capsules (100-200 mg total) by mouth 3 (three) times daily as needed., Disp: 30 capsule, Rfl: 0   guaiFENesin-dextromethorphan (ROBITUSSIN DM) 100-10 MG/5ML syrup, Take 5 mLs by mouth every 4 (four) hours as needed for cough., Disp: 118 mL, Rfl: 0   ALPRAZolam  (XANAX ) 0.5 MG tablet, Take 1 tablet by mouth twice daily as needed for anxiety, Disp: 60 tablet, Rfl: 5   amLODipine  (NORVASC ) 5 MG tablet, Take 1 tablet by mouth once daily, Disp: 30 tablet, Rfl: 6   apixaban  (ELIQUIS ) 2.5 MG TABS tablet, Take 1 tablet (2.5 mg  total) by mouth 2 (two) times daily., Disp: 60 tablet, Rfl: 2   aspirin  81 MG tablet, Take 81 mg by mouth daily., Disp: , Rfl:    cholecalciferol  (VITAMIN D ) 1000 UNITS tablet, Take 1,000 Units by mouth daily., Disp: , Rfl:    cyclobenzaprine  (FLEXERIL ) 10 MG tablet, Take 1 tablet by mouth three times daily as needed for muscle spasm, Disp: 90 tablet, Rfl: 0   EPINEPHrine  (EPIPEN  2-PAK) 0.3 mg/0.3 mL IJ SOAJ injection, Inject 0.3 mg into the muscle as needed for anaphylaxis., Disp: 1 each, Rfl: PRN   ergocalciferol  (DRISDOL ) 1.25 MG (50000 UT) capsule, Take 1 capsule (50,000 Units total) by mouth once a week., Disp: 12 capsule, Rfl: 3   fexofenadine  (ALLEGRA ) 180 MG tablet, Take 1 tablet (180 mg total) by mouth daily., Disp: 90 tablet, Rfl: 1   furosemide  (LASIX ) 20 MG tablet, TAKE 1 TABLET BY MOUTH ONCE DAILY FOR LEG SWELLING, Disp: 90 tablet, Rfl: 1   gabapentin  (NEURONTIN ) 600 MG tablet, Take 1 tablet (600 mg total) by mouth 3 (three) times daily., Disp: 90 tablet, Rfl: 1   HYDROcodone -acetaminophen  (NORCO/VICODIN) 5-325 MG tablet, Take 1 tablet  by mouth every 6 (six) hours as needed for moderate pain (pain score 4-6)., Disp: 30 tablet, Rfl: 0   hydrocortisone  valerate cream (WESTCORT ) 0.2 %, 1 app, Disp: , Rfl:    olmesartan  (BENICAR ) 20 MG tablet, Take 1 tablet by mouth once daily, Disp: 90 tablet, Rfl: 3   oseltamivir  (TAMIFLU ) 30 MG capsule, Take 1 capsule (30 mg total) by mouth 2 (two) times daily for 5 days., Disp: 10 capsule, Rfl: 0   potassium chloride  SA (KLOR-CON  M) 20 MEQ tablet, Take 1 tablet (20 mEq total) by mouth 2 (two) times daily., Disp: 60 tablet, Rfl: 2   rosuvastatin  (CRESTOR ) 20 MG tablet, TAKE 1/2 TABLET ONE TIME DAILY, Disp: 45 tablet, Rfl: 3   vitamin E  100 UNIT capsule, Take 100 Units by mouth daily., Disp: , Rfl:   "

## 2024-05-09 ENCOUNTER — Other Ambulatory Visit: Payer: Self-pay | Admitting: Internal Medicine

## 2024-05-09 ENCOUNTER — Telehealth: Payer: Self-pay | Admitting: Medical Oncology

## 2024-05-09 DIAGNOSIS — G893 Neoplasm related pain (acute) (chronic): Secondary | ICD-10-CM

## 2024-05-09 DIAGNOSIS — C9000 Multiple myeloma not having achieved remission: Secondary | ICD-10-CM

## 2024-05-09 MED ORDER — HYDROCODONE-ACETAMINOPHEN 5-325 MG PO TABS: 1.0000 | ORAL_TABLET | Freq: Four times a day (QID) | ORAL | 0 refills | Status: DC | PRN

## 2024-05-09 NOTE — Telephone Encounter (Signed)
Requests refill for Hydrocodone.

## 2024-05-10 ENCOUNTER — Inpatient Hospital Stay: Attending: Internal Medicine

## 2024-05-10 ENCOUNTER — Inpatient Hospital Stay: Admitting: Internal Medicine

## 2024-05-17 ENCOUNTER — Other Ambulatory Visit: Payer: Self-pay | Admitting: Internal Medicine

## 2024-05-17 DIAGNOSIS — R600 Localized edema: Secondary | ICD-10-CM

## 2024-05-30 ENCOUNTER — Other Ambulatory Visit: Payer: Self-pay | Admitting: Internal Medicine

## 2024-05-30 DIAGNOSIS — Z1231 Encounter for screening mammogram for malignant neoplasm of breast: Secondary | ICD-10-CM

## 2024-05-31 ENCOUNTER — Ambulatory Visit

## 2024-06-01 ENCOUNTER — Telehealth: Payer: Self-pay | Admitting: Medical Oncology

## 2024-06-01 ENCOUNTER — Other Ambulatory Visit: Payer: Self-pay | Admitting: Internal Medicine

## 2024-06-01 DIAGNOSIS — G893 Neoplasm related pain (acute) (chronic): Secondary | ICD-10-CM

## 2024-06-01 DIAGNOSIS — C9000 Multiple myeloma not having achieved remission: Secondary | ICD-10-CM

## 2024-06-01 MED ORDER — HYDROCODONE-ACETAMINOPHEN 5-325 MG PO TABS: 1.0000 | ORAL_TABLET | Freq: Four times a day (QID) | ORAL | 0 refills | Status: AC | PRN

## 2024-06-01 NOTE — Telephone Encounter (Signed)
Pt requests refill for hydrocodone.

## 2024-06-07 ENCOUNTER — Ambulatory Visit

## 2024-06-12 ENCOUNTER — Inpatient Hospital Stay: Admitting: Internal Medicine

## 2024-06-12 ENCOUNTER — Inpatient Hospital Stay: Attending: Internal Medicine

## 2024-06-27 ENCOUNTER — Ambulatory Visit: Admitting: Neurology

## 2024-08-03 ENCOUNTER — Ambulatory Visit: Admitting: Neurology
